# Patient Record
Sex: Female | Born: 1945 | Race: White | Hispanic: No | Marital: Married | Smoking: Former smoker
Health system: Southern US, Community
[De-identification: ages and names within clinical notes are randomized; demographics above are authoritative.]

## PROBLEM LIST (undated history)

## (undated) DIAGNOSIS — L409 Psoriasis, unspecified: Secondary | ICD-10-CM

## (undated) DIAGNOSIS — J018 Other acute sinusitis: Secondary | ICD-10-CM

## (undated) DIAGNOSIS — R0602 Shortness of breath: Secondary | ICD-10-CM

## (undated) DIAGNOSIS — M129 Arthropathy, unspecified: Secondary | ICD-10-CM

## (undated) DIAGNOSIS — K219 Gastro-esophageal reflux disease without esophagitis: Secondary | ICD-10-CM

## (undated) DIAGNOSIS — E669 Obesity, unspecified: Secondary | ICD-10-CM

## (undated) DIAGNOSIS — I872 Venous insufficiency (chronic) (peripheral): Secondary | ICD-10-CM

## (undated) DIAGNOSIS — G4733 Obstructive sleep apnea (adult) (pediatric): Secondary | ICD-10-CM

## (undated) DIAGNOSIS — I428 Other cardiomyopathies: Secondary | ICD-10-CM

## (undated) DIAGNOSIS — M1990 Unspecified osteoarthritis, unspecified site: Secondary | ICD-10-CM

## (undated) DIAGNOSIS — J449 Chronic obstructive pulmonary disease, unspecified: Secondary | ICD-10-CM

## (undated) DIAGNOSIS — I10 Essential (primary) hypertension: Secondary | ICD-10-CM

## (undated) HISTORY — DX: Obstructive sleep apnea (adult) (pediatric): G47.33

## (undated) HISTORY — PX: PELVIC FLOOR REPAIR: SHX2192

## (undated) HISTORY — DX: Other acute sinusitis: J01.80

## (undated) HISTORY — DX: Obesity, unspecified: E66.9

## (undated) HISTORY — DX: Gastro-esophageal reflux disease without esophagitis: K21.9

## (undated) HISTORY — DX: Venous insufficiency (chronic) (peripheral): I87.2

## (undated) HISTORY — PX: REPLACEMENT TOTAL KNEE: SUR1224

## (undated) HISTORY — DX: Unspecified osteoarthritis, unspecified site: M19.90

## (undated) HISTORY — DX: Other cardiomyopathies: I42.8

## (undated) HISTORY — DX: Arthropathy, unspecified: M12.9

---

## 1964-10-27 HISTORY — PX: SPLENECTOMY: SUR1306

## 1999-10-14 ENCOUNTER — Encounter: Payer: Self-pay | Admitting: *Deleted

## 1999-10-14 ENCOUNTER — Encounter: Admission: RE | Admit: 1999-10-14 | Discharge: 1999-10-14 | Payer: Self-pay | Admitting: *Deleted

## 1999-11-18 ENCOUNTER — Encounter: Payer: Self-pay | Admitting: Internal Medicine

## 1999-12-13 ENCOUNTER — Encounter: Payer: Self-pay | Admitting: Internal Medicine

## 2000-05-03 ENCOUNTER — Encounter: Payer: Self-pay | Admitting: Internal Medicine

## 2000-10-22 ENCOUNTER — Encounter: Payer: Self-pay | Admitting: *Deleted

## 2000-10-22 ENCOUNTER — Encounter: Admission: RE | Admit: 2000-10-22 | Discharge: 2000-10-22 | Payer: Self-pay | Admitting: *Deleted

## 2001-03-23 ENCOUNTER — Encounter: Payer: Self-pay | Admitting: Orthopedic Surgery

## 2001-03-29 ENCOUNTER — Inpatient Hospital Stay (HOSPITAL_COMMUNITY): Admission: AD | Admit: 2001-03-29 | Discharge: 2001-04-05 | Payer: Self-pay | Admitting: Orthopedic Surgery

## 2001-03-29 ENCOUNTER — Encounter: Payer: Self-pay | Admitting: Orthopedic Surgery

## 2001-08-04 ENCOUNTER — Other Ambulatory Visit: Admission: RE | Admit: 2001-08-04 | Discharge: 2001-08-04 | Payer: Self-pay | Admitting: Family Medicine

## 2001-10-25 ENCOUNTER — Encounter: Payer: Self-pay | Admitting: Family Medicine

## 2001-10-25 ENCOUNTER — Encounter: Admission: RE | Admit: 2001-10-25 | Discharge: 2001-10-25 | Payer: Self-pay | Admitting: Family Medicine

## 2002-10-26 ENCOUNTER — Encounter: Payer: Self-pay | Admitting: Family Medicine

## 2002-10-26 ENCOUNTER — Encounter: Admission: RE | Admit: 2002-10-26 | Discharge: 2002-10-26 | Payer: Self-pay | Admitting: Family Medicine

## 2003-08-08 ENCOUNTER — Other Ambulatory Visit: Admission: RE | Admit: 2003-08-08 | Discharge: 2003-08-08 | Payer: Self-pay | Admitting: Family Medicine

## 2003-09-06 ENCOUNTER — Encounter (INDEPENDENT_AMBULATORY_CARE_PROVIDER_SITE_OTHER): Payer: Self-pay | Admitting: Specialist

## 2003-09-06 ENCOUNTER — Ambulatory Visit (HOSPITAL_BASED_OUTPATIENT_CLINIC_OR_DEPARTMENT_OTHER): Admission: RE | Admit: 2003-09-06 | Discharge: 2003-09-06 | Payer: Self-pay | Admitting: Surgery

## 2003-09-06 ENCOUNTER — Ambulatory Visit (HOSPITAL_COMMUNITY): Admission: RE | Admit: 2003-09-06 | Discharge: 2003-09-06 | Payer: Self-pay | Admitting: Surgery

## 2003-10-10 ENCOUNTER — Ambulatory Visit (HOSPITAL_BASED_OUTPATIENT_CLINIC_OR_DEPARTMENT_OTHER): Admission: RE | Admit: 2003-10-10 | Discharge: 2003-10-10 | Payer: Self-pay | Admitting: Internal Medicine

## 2003-10-30 ENCOUNTER — Encounter: Admission: RE | Admit: 2003-10-30 | Discharge: 2003-10-30 | Payer: Self-pay | Admitting: Family Medicine

## 2004-06-27 ENCOUNTER — Encounter: Admission: RE | Admit: 2004-06-27 | Discharge: 2004-06-27 | Payer: Self-pay | Admitting: Orthopedic Surgery

## 2004-09-06 ENCOUNTER — Other Ambulatory Visit: Admission: RE | Admit: 2004-09-06 | Discharge: 2004-09-06 | Payer: Self-pay | Admitting: Obstetrics and Gynecology

## 2004-10-30 ENCOUNTER — Encounter: Admission: RE | Admit: 2004-10-30 | Discharge: 2004-10-30 | Payer: Self-pay | Admitting: Obstetrics and Gynecology

## 2005-05-22 ENCOUNTER — Ambulatory Visit: Payer: Self-pay | Admitting: Internal Medicine

## 2005-10-13 ENCOUNTER — Ambulatory Visit: Payer: Self-pay | Admitting: Internal Medicine

## 2005-10-15 ENCOUNTER — Ambulatory Visit: Payer: Self-pay | Admitting: Internal Medicine

## 2005-10-22 ENCOUNTER — Encounter: Payer: Self-pay | Admitting: Cardiology

## 2005-10-22 ENCOUNTER — Ambulatory Visit: Payer: Self-pay

## 2005-10-28 ENCOUNTER — Ambulatory Visit: Payer: Self-pay | Admitting: Internal Medicine

## 2005-10-31 ENCOUNTER — Encounter: Admission: RE | Admit: 2005-10-31 | Discharge: 2005-10-31 | Payer: Self-pay | Admitting: Family Medicine

## 2006-01-09 ENCOUNTER — Ambulatory Visit: Payer: Self-pay | Admitting: Internal Medicine

## 2006-02-02 ENCOUNTER — Ambulatory Visit: Payer: Self-pay | Admitting: Internal Medicine

## 2006-02-19 ENCOUNTER — Ambulatory Visit: Payer: Self-pay | Admitting: Internal Medicine

## 2006-02-27 ENCOUNTER — Inpatient Hospital Stay (HOSPITAL_BASED_OUTPATIENT_CLINIC_OR_DEPARTMENT_OTHER): Admission: RE | Admit: 2006-02-27 | Discharge: 2006-02-27 | Payer: Self-pay | Admitting: Internal Medicine

## 2006-02-27 ENCOUNTER — Ambulatory Visit: Payer: Self-pay | Admitting: Internal Medicine

## 2006-03-16 ENCOUNTER — Ambulatory Visit: Payer: Self-pay | Admitting: Internal Medicine

## 2006-03-19 ENCOUNTER — Encounter: Payer: Self-pay | Admitting: Cardiology

## 2006-03-19 ENCOUNTER — Ambulatory Visit: Payer: Self-pay

## 2006-03-30 ENCOUNTER — Ambulatory Visit: Payer: Self-pay | Admitting: Internal Medicine

## 2006-04-03 ENCOUNTER — Ambulatory Visit: Payer: Self-pay | Admitting: Internal Medicine

## 2006-04-23 ENCOUNTER — Ambulatory Visit: Payer: Self-pay | Admitting: Internal Medicine

## 2006-04-23 ENCOUNTER — Encounter (HOSPITAL_COMMUNITY): Admission: RE | Admit: 2006-04-23 | Discharge: 2006-07-07 | Payer: Self-pay | Admitting: Internal Medicine

## 2006-05-07 ENCOUNTER — Ambulatory Visit: Payer: Self-pay | Admitting: Internal Medicine

## 2006-05-07 ENCOUNTER — Ambulatory Visit: Payer: Self-pay | Admitting: Cardiology

## 2006-05-11 ENCOUNTER — Ambulatory Visit: Payer: Self-pay

## 2006-05-11 ENCOUNTER — Ambulatory Visit: Payer: Self-pay | Admitting: Internal Medicine

## 2006-05-21 ENCOUNTER — Ambulatory Visit: Payer: Self-pay | Admitting: Internal Medicine

## 2006-05-27 ENCOUNTER — Ambulatory Visit: Payer: Self-pay | Admitting: Internal Medicine

## 2006-07-27 ENCOUNTER — Ambulatory Visit: Payer: Self-pay | Admitting: Internal Medicine

## 2006-09-07 ENCOUNTER — Encounter: Payer: Self-pay | Admitting: Cardiology

## 2006-09-07 ENCOUNTER — Ambulatory Visit: Payer: Self-pay

## 2006-10-23 ENCOUNTER — Ambulatory Visit: Payer: Self-pay | Admitting: Internal Medicine

## 2006-10-23 LAB — CONVERTED CEMR LAB
Chol/HDL Ratio, serum: 4.7
HDL: 41.7 mg/dL (ref >39.0)
LDL Cholesterol: 139 mg/dL — ABNORMAL HIGH (ref 0–99)
Triglyceride fasting, serum: 75 mg/dL (ref 0–149)
VLDL: 15 mg/dL (ref 0–40)

## 2006-11-23 ENCOUNTER — Ambulatory Visit: Payer: Self-pay | Admitting: Internal Medicine

## 2007-02-11 ENCOUNTER — Ambulatory Visit: Payer: Self-pay | Admitting: Internal Medicine

## 2007-02-23 ENCOUNTER — Encounter: Admission: RE | Admit: 2007-02-23 | Discharge: 2007-02-23 | Payer: Self-pay | Admitting: Family Medicine

## 2007-03-19 ENCOUNTER — Ambulatory Visit (HOSPITAL_COMMUNITY): Admission: RE | Admit: 2007-03-19 | Discharge: 2007-03-19 | Payer: Self-pay | Admitting: Obstetrics and Gynecology

## 2007-03-19 ENCOUNTER — Encounter (INDEPENDENT_AMBULATORY_CARE_PROVIDER_SITE_OTHER): Payer: Self-pay | Admitting: Obstetrics and Gynecology

## 2007-04-01 ENCOUNTER — Ambulatory Visit: Payer: Self-pay | Admitting: Cardiology

## 2007-05-21 ENCOUNTER — Encounter: Payer: Self-pay | Admitting: Internal Medicine

## 2007-05-21 ENCOUNTER — Ambulatory Visit: Payer: Self-pay

## 2007-05-28 ENCOUNTER — Ambulatory Visit: Payer: Self-pay | Admitting: Internal Medicine

## 2007-08-26 ENCOUNTER — Ambulatory Visit: Payer: Self-pay | Admitting: Internal Medicine

## 2007-08-27 ENCOUNTER — Ambulatory Visit: Payer: Self-pay | Admitting: Internal Medicine

## 2007-12-02 ENCOUNTER — Encounter: Payer: Self-pay | Admitting: Internal Medicine

## 2007-12-02 DIAGNOSIS — E669 Obesity, unspecified: Secondary | ICD-10-CM | POA: Insufficient documentation

## 2007-12-02 DIAGNOSIS — J018 Other acute sinusitis: Secondary | ICD-10-CM

## 2007-12-02 DIAGNOSIS — I872 Venous insufficiency (chronic) (peripheral): Secondary | ICD-10-CM | POA: Insufficient documentation

## 2007-12-02 DIAGNOSIS — K219 Gastro-esophageal reflux disease without esophagitis: Secondary | ICD-10-CM | POA: Insufficient documentation

## 2007-12-02 DIAGNOSIS — G4733 Obstructive sleep apnea (adult) (pediatric): Secondary | ICD-10-CM | POA: Insufficient documentation

## 2007-12-02 DIAGNOSIS — I429 Cardiomyopathy, unspecified: Secondary | ICD-10-CM

## 2007-12-02 DIAGNOSIS — I428 Other cardiomyopathies: Secondary | ICD-10-CM | POA: Insufficient documentation

## 2007-12-02 DIAGNOSIS — M129 Arthropathy, unspecified: Secondary | ICD-10-CM | POA: Insufficient documentation

## 2007-12-30 ENCOUNTER — Ambulatory Visit: Payer: Self-pay | Admitting: Internal Medicine

## 2008-02-28 ENCOUNTER — Encounter: Admission: RE | Admit: 2008-02-28 | Discharge: 2008-02-28 | Payer: Self-pay | Admitting: Obstetrics and Gynecology

## 2008-02-29 ENCOUNTER — Ambulatory Visit (HOSPITAL_COMMUNITY): Admission: RE | Admit: 2008-02-29 | Discharge: 2008-02-29 | Payer: Self-pay | Admitting: Obstetrics and Gynecology

## 2008-03-14 ENCOUNTER — Telehealth (INDEPENDENT_AMBULATORY_CARE_PROVIDER_SITE_OTHER): Payer: Self-pay | Admitting: *Deleted

## 2008-03-28 ENCOUNTER — Ambulatory Visit: Payer: Self-pay | Admitting: Internal Medicine

## 2008-05-09 ENCOUNTER — Encounter: Payer: Self-pay | Admitting: Internal Medicine

## 2008-06-01 ENCOUNTER — Ambulatory Visit: Payer: Self-pay | Admitting: Internal Medicine

## 2008-06-01 LAB — CONVERTED CEMR LAB
BUN: 21 mg/dL (ref 6–23)
CO2: 32 meq/L (ref 19–32)
GFR calc Af Amer: 93 mL/min
GFR calc non Af Amer: 77 mL/min
Sodium: 143 meq/L (ref 135–145)

## 2008-06-21 ENCOUNTER — Encounter: Payer: Self-pay | Admitting: Internal Medicine

## 2008-06-21 ENCOUNTER — Ambulatory Visit: Payer: Self-pay

## 2008-07-07 ENCOUNTER — Encounter: Payer: Self-pay | Admitting: Internal Medicine

## 2008-07-26 ENCOUNTER — Encounter: Payer: Self-pay | Admitting: Internal Medicine

## 2008-08-21 ENCOUNTER — Ambulatory Visit: Payer: Self-pay | Admitting: Internal Medicine

## 2008-12-15 ENCOUNTER — Ambulatory Visit: Payer: Self-pay | Admitting: Internal Medicine

## 2009-01-02 ENCOUNTER — Encounter: Payer: Self-pay | Admitting: Internal Medicine

## 2009-01-31 ENCOUNTER — Inpatient Hospital Stay (HOSPITAL_COMMUNITY): Admission: RE | Admit: 2009-01-31 | Discharge: 2009-02-04 | Payer: Self-pay | Admitting: Orthopedic Surgery

## 2009-02-09 ENCOUNTER — Telehealth: Payer: Self-pay | Admitting: Internal Medicine

## 2009-04-10 ENCOUNTER — Encounter: Admission: RE | Admit: 2009-04-10 | Discharge: 2009-04-10 | Payer: Self-pay | Admitting: Family Medicine

## 2009-04-23 ENCOUNTER — Ambulatory Visit: Payer: Self-pay | Admitting: Internal Medicine

## 2009-05-22 ENCOUNTER — Telehealth: Payer: Self-pay | Admitting: Internal Medicine

## 2009-06-08 ENCOUNTER — Telehealth: Payer: Self-pay | Admitting: Internal Medicine

## 2009-06-13 ENCOUNTER — Encounter: Payer: Self-pay | Admitting: Internal Medicine

## 2009-07-18 ENCOUNTER — Telehealth: Payer: Self-pay | Admitting: Internal Medicine

## 2009-07-30 ENCOUNTER — Ambulatory Visit: Payer: Self-pay | Admitting: Internal Medicine

## 2009-07-30 DIAGNOSIS — E785 Hyperlipidemia, unspecified: Secondary | ICD-10-CM | POA: Insufficient documentation

## 2009-09-17 ENCOUNTER — Encounter: Admission: RE | Admit: 2009-09-17 | Discharge: 2009-09-17 | Payer: Self-pay | Admitting: Family Medicine

## 2009-10-22 ENCOUNTER — Ambulatory Visit: Payer: Self-pay | Admitting: Internal Medicine

## 2009-10-23 ENCOUNTER — Telehealth: Payer: Self-pay | Admitting: Internal Medicine

## 2009-10-27 HISTORY — PX: ABDOMINAL HYSTERECTOMY: SHX81

## 2009-10-27 HISTORY — PX: BLADDER SURGERY: SHX569

## 2009-11-19 ENCOUNTER — Telehealth (INDEPENDENT_AMBULATORY_CARE_PROVIDER_SITE_OTHER): Payer: Self-pay | Admitting: *Deleted

## 2010-02-01 ENCOUNTER — Telehealth (INDEPENDENT_AMBULATORY_CARE_PROVIDER_SITE_OTHER): Payer: Self-pay | Admitting: *Deleted

## 2010-02-21 ENCOUNTER — Ambulatory Visit: Payer: Self-pay | Admitting: Internal Medicine

## 2010-04-08 ENCOUNTER — Ambulatory Visit: Payer: Self-pay | Admitting: Cardiovascular Disease

## 2010-04-08 ENCOUNTER — Telehealth: Payer: Self-pay | Admitting: Internal Medicine

## 2010-04-08 ENCOUNTER — Ambulatory Visit (HOSPITAL_COMMUNITY): Admission: RE | Admit: 2010-04-08 | Discharge: 2010-04-08 | Payer: Self-pay | Admitting: Internal Medicine

## 2010-04-08 ENCOUNTER — Ambulatory Visit: Payer: Self-pay

## 2010-04-08 ENCOUNTER — Encounter: Payer: Self-pay | Admitting: Internal Medicine

## 2010-04-11 ENCOUNTER — Ambulatory Visit: Payer: Self-pay | Admitting: Internal Medicine

## 2010-04-11 DIAGNOSIS — J310 Chronic rhinitis: Secondary | ICD-10-CM | POA: Insufficient documentation

## 2010-04-12 ENCOUNTER — Encounter: Admission: RE | Admit: 2010-04-12 | Discharge: 2010-04-12 | Payer: Self-pay | Admitting: Obstetrics and Gynecology

## 2010-04-19 LAB — CONVERTED CEMR LAB: IgE (Immunoglobulin E), Serum: 80.9 intl units/mL (ref 0.0–180.0)

## 2010-05-13 ENCOUNTER — Ambulatory Visit: Payer: Self-pay | Admitting: Internal Medicine

## 2010-06-04 ENCOUNTER — Telehealth: Payer: Self-pay | Admitting: Internal Medicine

## 2010-06-05 ENCOUNTER — Encounter: Payer: Self-pay | Admitting: Internal Medicine

## 2010-06-06 ENCOUNTER — Telehealth: Payer: Self-pay | Admitting: Internal Medicine

## 2010-06-18 ENCOUNTER — Encounter: Payer: Self-pay | Admitting: Internal Medicine

## 2010-08-20 ENCOUNTER — Telehealth: Payer: Self-pay | Admitting: Internal Medicine

## 2010-08-20 ENCOUNTER — Ambulatory Visit: Payer: Self-pay | Admitting: Internal Medicine

## 2010-08-20 ENCOUNTER — Telehealth (INDEPENDENT_AMBULATORY_CARE_PROVIDER_SITE_OTHER): Payer: Self-pay | Admitting: *Deleted

## 2010-08-20 DIAGNOSIS — J42 Unspecified chronic bronchitis: Secondary | ICD-10-CM | POA: Insufficient documentation

## 2010-08-21 ENCOUNTER — Encounter: Payer: Self-pay | Admitting: Internal Medicine

## 2010-09-03 ENCOUNTER — Encounter (INDEPENDENT_AMBULATORY_CARE_PROVIDER_SITE_OTHER): Payer: Self-pay | Admitting: Obstetrics and Gynecology

## 2010-09-03 ENCOUNTER — Inpatient Hospital Stay (HOSPITAL_COMMUNITY)
Admission: RE | Admit: 2010-09-03 | Discharge: 2010-09-05 | Payer: Self-pay | Source: Home / Self Care | Admitting: Obstetrics and Gynecology

## 2010-10-07 ENCOUNTER — Ambulatory Visit: Payer: Self-pay | Admitting: Internal Medicine

## 2010-10-14 ENCOUNTER — Telehealth: Payer: Self-pay | Admitting: Internal Medicine

## 2010-10-31 ENCOUNTER — Ambulatory Visit: Admit: 2010-10-31 | Payer: Self-pay | Admitting: Internal Medicine

## 2010-11-28 NOTE — Assessment & Plan Note (Signed)
Summary: PER CHECK OUT/SF   Visit Type:  Follow-up Primary Crystal Yoder:  Crystal Yoder  CC:  ROV; No cardiac complaints.  History of Present Illness: Patient is a 65 year old with a history of idiopathic cardiomyopathy with normalization of LV function,  dyslipidemia, obesity, GERD, arthritis.  I last saw her in the spring.  Since seen she had an echo which showed the LVEF was 45 to 50%. She denies SOB.  No chest tightness.  NO edema. Underwent TAH with tacking of bladder in November.  Still recuperating.  Still has urinary incontinence.  Current Medications (verified): 1)  Bipap 17/8 2)  Carvedilol 3.125 Mg Tabs (Carvedilol) .Marland Kitchen.. 1 Tablet 2 Times Per Day 3)  Mucinex Dm 30-600 Mg Xr12h-Tab (Dextromethorphan-Guaifenesin) .... Take As Directed As Needed 4)  Claritin 10 Mg Tabs (Loratadine) .... Take 1 By Mouth Once Daily 5)  Fluticasone Propionate 50 Mcg/act Susp (Fluticasone Propionate) .Marland Kitchen.. 1-2 Sprays in Each Nostril Once Daily 6)  Aleve 220 Mg Tabs (Naproxen Sodium) .... Take As Directed As Needed 7)  Protonix 40 Mg Tbec (Pantoprazole Sodium) .Marland Kitchen.. 1 Tab Once Daily 8)  Losartan Potassium 50 Mg Tabs (Losartan Potassium) .Marland Kitchen.. 1 Tab Every Day 9)  Hydromet 5-1.5 Mg/26ml Syrp (Hydrocodone-Homatropine) .Marland Kitchen.. 1 Teaspoon Four Times A Day As Needed Cough  Allergies: 1)  ! Pcn 2)  ! Sulfa  Past History:  Family History: Last updated: Sep 18, 2008 Mother deceased at age 18 from failure to thrive Father deceased at age 30 from prostate cancer Son poor sleep hygiene, sleep apnea  Social History: Last updated: 03/28/2008 Patient states former smoker. She quit 28 yrs ago and she smoked for 10 yrs at 1/2 ppd.  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  Risk Factors: Smoking Status: quit (05/13/2010) Packs/Day: 0.5 (08/20/2010)  Past medical, surgical, family and social histories (including risk factors) reviewed, and no changes noted (except as noted  below).  Past Medical History: Reviewed history from 12/02/2007 and no changes required. CARDIOMYOPATHY (ICD-425.4) GASTROESOPHAGEAL REFLUX DISEASE (ICD-530.81) UNSPECIFIED VENOUS INSUFFICIENCY (ICD-459.81) EXOGENOUS OBESITY (ICD-278.00) ARTHRITIS (ICD-716.90) RHINOSINUSITIS, ACUTE (ICD-461.8) SLEEP APNEA, OBSTRUCTIVE (ICD-327.23)  Past Surgical History: Reviewed history from 04/23/2009 and no changes required. Splenectomy 1966  LeftKnee replacement 2001 Right Knee replacement- 2010  Family History: Reviewed history from Sep 18, 2008 and no changes required. Mother deceased at age 9 from failure to thrive Father deceased at age 15 from prostate cancer Son poor sleep hygiene, sleep apnea  Social History: Reviewed history from 03/28/2008 and no changes required. Patient states former smoker. She quit 28 yrs ago and she smoked for 10 yrs at 1/2 ppd.  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  Review of Systems       All systems reviewed.  Neg to the above problem.  Vital Signs:  Patient profile:   65 year old female Height:      59 inches Weight:      257 pounds BMI:     52.10 Pulse rate:   72 / minute BP sitting:   110 / 70  (left arm) Cuff size:   large  Vitals Entered By: Stanton Kidney, EMT-P (October 07, 2010 2:01 PM)  Physical Exam  Additional Exam:  Patient is in NAD. HEENT:  Normocephalic, atraumatic. EOMI, PERRLA.  Neck: JVP is normal. No thyromegaly. No bruits.  Lungs: clear to auscultation. No rales no wheezes.  Heart: Regular rate and rhythm. Normal  S1, S2. No S3.   No significant murmurs. PMI not displaced.  Abdomen:  Supple, nontender. Normal bowel sounds. No masses. No hepatomegaly.  Extremities:   Good distal pulses throughout. No lower extremity edema.  Musculoskeletal :moving all extremities.  Neuro:   alert and oriented x3.    Impression & Recommendations:  Problem # 1:  CARDIOMYOPATHY (ICD-425.4) Volume  status looks good.  I would keep on same regimen. Her updated medication list for this problem includes:    Carvedilol 3.125 Mg Tabs (Carvedilol) .Marland Kitchen... 1 tablet 2 times per day    Losartan Potassium 50 Mg Tabs (Losartan potassium) .Marland Kitchen... 1 tab every day  Problem # 2:  HYPERLIPIDEMIA-MIXED (ICD-272.4) Will review.  Problem # 3:  SLEEP APNEA, OBSTRUCTIVE (ICD-327.23) Encouraged her to try to use CPAP more.  Patient Instructions: 1)  Your physician recommends that you continue on your current medications as directed. Please refer to the Current Medication list given to you today.  Appended Document: PER CHECK OUT/SF Patient needs fasting lipid panel at her convenience.  It has been several years since last checked.  Appended Document: PER CHECK OUT/SF PT AWARE WILL HAVE LABS DONE AFTER FIRST OF THE YEAR./CY

## 2010-11-28 NOTE — Progress Notes (Signed)
Summary: rx  Phone Note Call from Patient Call back at Home Phone [phone] Call back at [phone] cell   Caller: Patient Call For: young Reason for Call: Refill Medication, Talk to Nurse Summary of Call: pt out of her cough medicine.  Would like a refill called in.  Still has very bad cough.  Hydromet syrup. [facility] - [phone] Initial call taken by: Eugene Gavia,  November 19, 2009 9:17 AM  Follow-up for Phone Call        Will forward to CY- last sent on 10/23/09 #279ml x0 - please advise if ok to send.  Thanks! Gweneth Dimitri RN  November 19, 2009 9:32 AM   Additional Follow-up for Phone Call Additional follow up Details #1::        Per CDY- ok to refill Hydromet syrup #323ml 1 tsp every 6 hours as needed cough. No refills.Reynaldo Minium CMA  November 19, 2009 11:53 AM     Additional Follow-up for Phone Call Additional follow up Details #2::    called, spoke with pt.  Pt informed hydromet has been called into [facility]  She verbalized understanding.  Follow-up by: Gweneth Dimitri RN,  November 19, 2009 12:00 PM  Prescriptions: HYDROMET 5-1.5 MG/5ML SYRP (HYDROCODONE-HOMATROPINE) 1 tsp every 6 hours as needed for cough  #300 ml x 0   Entered by:   Gweneth Dimitri RN   Authorized by:   Waymon Budge MD   Signed by:   Gweneth Dimitri RN on 11/19/2009   Method used:   Telephoned to ...       [facility]  [address] [phone]* (retail)       [address]       [place], [place]  [address]       Ph: [phone] or [phone]       Fax: [phone]   RxID:   [id number]

## 2010-11-28 NOTE — Progress Notes (Signed)
Summary: pt has questions  Phone Note Call from Patient   Caller: Patient [phone] Reason for Call: Talk to Nurse Summary of Call: pt has questions re last visit/medications Initial call taken by: Glynda Jaeger,  October 14, 2010 9:51 AM  Follow-up for Phone Call        spoke with pt  She needs to come in and have fasting lipid panel drawn and a follow up in 6 months She is aware and wants to ask Dr Tenny Craw a question.  Gave message to Dr Jasmine Pang, RN, BSN  October 14, 2010 10:05 AM     Appended Document: pt has questions Had questons about Ditropan and Losartan.  Should be ok to take together.

## 2010-11-28 NOTE — Assessment & Plan Note (Signed)
Summary: 6 months/apc   Primary Provider/Referring Provider:  Laurann Montana  CC:  6 month follow up visit-sleep; pt also c/o nasal congestion and productive cough-yellow..  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 Using Qvar only as needed .BiPAP being used some. Was bothered by condensation in hose. OK saline nasalgel before nasal pillows. TV til late, sleeps til 9AM. Joined [employer] and going to gym for water aerobics. Sees Dr. Lovina Reach card- for cardiomyopathy and bradycardia. Got Flu vax.   04/23/09- OSA, GERD, rhinosinusitis, cardiomyopathy Had second TKR in April without respiratory complications. She didn't use Bipap-"too complicated" but she is starting to use it again as she recovers.  Heart- Says heart is doing well and gave no problem with surgery. Last EF 55%. Denies chest pain. Dyspnea has much improved. Occasional mild morning cough with scant phlegm "nasal drainage".   October 22, 2009- OSA, GERD, rhinitis cardiomyopathy........................Marland Kitchenwith husband Cough [place] at beach. Dr there gave Z pak for sinusitis. Ears were stopped up. Then in [place], doctor gave Nasaccort AQ, naprosyn, Claritin, Mucinex DM. Some green from nose. Mild cough then. Returned home. Saw Dr Jearld Fenton who helped by cleaning ears. He gave more Nasacort, clindamycin. Felt better. Was also given samples of Xyzal.. Saw Dr Nelda Marseille at [facility]- got Levaquin, MucinexDM. He also gave Hydromet for increasing cough at night.  Still coughing. Had ducts checked- told black mold. Cough now productive green yellow in AM. Not using CPAP recently.  April 11, 2010- OSA, GERD, Rhinitis, cardiomyopathy Not using CPAP, blaming nasal stuffiness and cough. Using Neti pot twice daily. Taking an acid blocker intermittently. Taking Avelox after drainage of cyst on shoulder. Admits being very tired and says she wants to get back to her CPAP. Using flonase nasal spray.  Dropped off claritn.    Preventive Screening-Counseling & Management  Alcohol-Tobacco     Smoking Status: quit     Year Quit: 1980  Current Medications (verified): 1)  Bipap 17/8 2)  Carvedilol 3.125 Mg Tabs (Carvedilol) .Marland Kitchen.. 1 Tablet 2 Times Per Day 3)  Mucinex Dm 30-600 Mg Xr12h-Tab (Dextromethorphan-Guaifenesin) .... Take As Directed As Needed 4)  Claritin 10 Mg Tabs (Loratadine) .... Take 1 By Mouth Once Daily 5)  Fluticasone Propionate 50 Mcg/act Susp (Fluticasone Propionate) .Marland Kitchen.. 1-2 Sprays in Each Nostril Once Daily 6)  Aleve 220 Mg Tabs (Naproxen Sodium) .... Take As Directed As Needed 7)  Protonix 40 Mg Tbec (Pantoprazole Sodium) .Marland Kitchen.. 1 Tab Once Daily 8)  Losartan Potassium 50 Mg Tabs (Losartan Potassium) .Marland Kitchen.. 1 Tab Every Day  Allergies (verified): 1)  ! Pcn 2)  ! Sulfa  Past History:  Past Medical History: Last updated: 12/02/2007 CARDIOMYOPATHY (ICD-425.4) GASTROESOPHAGEAL REFLUX DISEASE (ICD-530.81) UNSPECIFIED VENOUS INSUFFICIENCY (ICD-459.81) EXOGENOUS OBESITY (ICD-278.00) ARTHRITIS (ICD-716.90) RHINOSINUSITIS, ACUTE (ICD-461.8) SLEEP APNEA, OBSTRUCTIVE (ICD-327.23)  Past Surgical History: Last updated: 04/23/2009 Splenectomy 1966  LeftKnee replacement 2001 Right Knee replacement- 2010  Family History: Last updated: 08-29-08 Mother deceased at age 34 from failure to thrive Father deceased at age 41 from prostate cancer Son poor sleep hygiene, sleep apnea  Social History: Last updated: 03/28/2008 Patient states former smoker. She quit 28 yrs ago and she smoked for 10 yrs at 1/2 ppd.  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  Risk Factors: Smoking Status: quit (04/11/2010)  Review of Systems      See HPI       The patient complains  of non-productive cough, nasal congestion/difficulty breathing through nose, and sneezing.  The patient denies shortness of breath with activity, shortness of breath at rest,  productive cough, coughing up blood, chest pain, irregular heartbeats, acid heartburn, indigestion, loss of appetite, weight change, abdominal pain, difficulty swallowing, sore throat, tooth/dental problems, and headaches.    Vital Signs:  Patient profile:   65 year old female Height:      69 inches Weight:      258 pounds BMI:     38.24 O2 Sat:      93 % on Room air Pulse rate:   86 / minute BP sitting:   116 / 74  (left arm) Cuff size:   large  Vitals Entered By: Reynaldo Minium CMA (April 11, 2010 11:11 AM)  O2 Flow:  Room air CC: 6 month follow up visit-sleep; pt also c/o nasal congestion and productive cough-yellow.   Physical Exam  Additional Exam:  General: A/Ox3; pleasant and cooperative, NAD, overweight, cheerful, yawning SKIN: no rash, lesions NODES: no lymphadenopathy HEENT: [place]/AT, EOM- WNL, Conjuctivae- clear, PERRLA, TM-WNL, Nose- mildly nasal, Throat- clear and wnl, Mallampati IV, no stridor NECK: Supple w/ fair ROM, JVD- none, normal carotid impulses w/o bruits Thyroid- CHEST: Clear to P&A, dry cough, no wheeze HEART: RRR, no m/g/r heard ABDOMEN: Soft and nl;  EAV:WUJW, nl pulses, trace  edema, heavy legs with prominent superficial varices at ankles, no cyanosis or clubbing  NEURO: Grossly intact to observation      Impression & Recommendations:  Problem # 1:  SLEEP APNEA, OBSTRUCTIVE (ICD-327.23)  Chronic poor compliance with CPAP. Weight loss also remains a goal. Compliance efforts continue.  Problem # 2:  ALLERGIC RHINITIS (ICD-477.9)  We will send Allergy profile and try sample Patanase with discussion. Refilled cough syrup Her updated medication list for this problem includes:    Claritin 10 Mg Tabs (Loratadine) .Marland Kitchen... Take 1 by mouth once daily    Fluticasone Propionate 50 Mcg/act Susp (Fluticasone propionate) .Marland Kitchen... 1-2 sprays in each nostril once daily  Medications Added to Medication List This Visit: 1)  Fluticasone Propionate 50 Mcg/act Susp  (Fluticasone propionate) .Marland Kitchen.. 1-2 sprays in each nostril once daily 2)  Hydrocodone-homatropine 5-1.5 Mg/47ml Syrp (Hydrocodone-homatropine) .Marland Kitchen.. 1 teaspoon four times a day as needed cough  Other Orders: Est. Patient Level III (11914) T-"RAST" (Allergy Full Profile) IGE ([address]-62130)  Patient Instructions: 1)  Please schedule a follow-up appointment in 1 month. 2)  lab 3)  Sample Patanase nasal spray: 1-2 puffs each nostril two times a day as needed  Prescriptions: HYDROCODONE-HOMATROPINE 5-1.5 MG/5ML SYRP (HYDROCODONE-HOMATROPINE) 1 teaspoon four times a day as needed cough  #200 ml x 1   Entered and Authorized by:   Waymon Budge MD   Signed by:   Waymon Budge MD on 04/11/2010   Method used:   Print then Give to Patient   RxID:   [id number]

## 2010-11-28 NOTE — Progress Notes (Signed)
Summary: surgical clearance  Phone Note From Other Clinic   Caller: NURSE AMANDA Summary of Call: Per Marchelle Folks Dr Edward Jolly ofc pt needs written clearance for surgery. ofc [id number] fax [phone]. Procedure 11/8 Initial call taken by: Edman Circle,  August 20, 2010 9:43 AM  Follow-up for Phone Call        pt was cleared for surg in Aug for hysterectomy, have left mess w/Amanda to call back and let us know if this is the same suregery or a new one Follow-up by: Meredith Staggers, RN,  August 20, 2010 9:57 AM  Additional Follow-up for Phone Call Additional follow up Details #1::        spoke w/Amanda it is for a hysterectomy it has not been done yet, it is sch for 11/8 and since clearance letter was from Aug they need a new one, will send to Dr Tenny Craw to address on [address]. Meredith Staggers, RN  August 20, 2010 10:02 AM     Additional Follow-up for Phone Call Additional follow up Details #2::    Letter dictated. Follow-up by: Sherrill Raring, MD, [facility],  August 21, 2010 5:21 AM   Appended Document: surgical clearance Letter faxed to [facility].

## 2010-11-28 NOTE — Assessment & Plan Note (Signed)
Summary: per dr silva/surgical clearance/mhh   Primary Crystal Yoder/Referring Crystal Yoder:  Crystal Yoder  CC:  Follow up , surgical clearance, scheduled for hysterectomy Nov.8th , Bi-pap averages 6 hrs per night , and not using now due to nasal congestion.  History of Present Illness: April 11, 2010- OSA, GERD, Rhinitis, cardiomyopathy Not using CPAP, blaming nasal stuffiness and cough. Using Neti pot twice daily. Taking an acid blocker intermittently. Taking Avelox after drainage of cyst on shoulder. Admits being very tired and says she wants to get back to her CPAP. Using flonase nasal spray. Dropped off claritn.  May 13, 2010- OSA, GERD, Rhinitis,  Hx cardiomyopathy Her cough is better, possibly because we are past the Spring pollen season.  IgE 86.9 with no specific elevation on allergy profile. She is trying to decide what time of year would be best if she has hysterectomy in hopes of avoiding infectins. With congestion better, she says she is "about ready to start" her BIPAP again, meaning she hasn't felt motivated to try it in a long time.  August 20, 2010-  OSA, GERD, Rhinitis,  Hx cardiomyopathy Nurse-CC: Follow up, surgical clearance, scheduled for hysterectomy Nov.8th , Bi-pap averages 6 hrs per night , not using now due to nasal congestion She is now facing hysterectomy and pelvic floor rebuild.  She still has BiPAP at 17/8. We discussed use at surgery.  Discussed postop respiratory management. Noting recent nasal congestion and is using Flonase now.  Chest feels ok. Had EKG today. Needs cough syrup. Dislikes benzonatate perles. Not wheezing   Preventive Screening-Counseling & Management  Alcohol-Tobacco     Packs/Day: 0.5     Year Started: 1968     Year Quit: 1980  Current Medications (verified): 1)  Bipap 17/8 2)  Carvedilol 3.125 Mg Tabs (Carvedilol) .Marland Kitchen.. 1 Tablet 2 Times Per Day 3)  Mucinex Dm 30-600 Mg Xr12h-Tab (Dextromethorphan-Guaifenesin) .... Take As Directed As  Needed 4)  Claritin 10 Mg Tabs (Loratadine) .... Take 1 By Mouth Once Daily 5)  Fluticasone Propionate 50 Mcg/act Susp (Fluticasone Propionate) .Marland Kitchen.. 1-2 Sprays in Each Nostril Once Daily 6)  Aleve 220 Mg Tabs (Naproxen Sodium) .... Take As Directed As Needed 7)  Protonix 40 Mg Tbec (Pantoprazole Sodium) .Marland Kitchen.. 1 Tab Once Daily 8)  Losartan Potassium 50 Mg Tabs (Losartan Potassium) .Marland Kitchen.. 1 Tab Every Day 9)  Calcium + D 250-125 Mg-Unit Tabs (Calcium-Vitamin D) .Marland Kitchen.. 1 Once Daily  Allergies (verified): 1)  ! Pcn 2)  ! Sulfa  Past History:  Past Medical History: Last updated: 12/02/2007 CARDIOMYOPATHY (ICD-425.4) GASTROESOPHAGEAL REFLUX DISEASE (ICD-530.81) UNSPECIFIED VENOUS INSUFFICIENCY (ICD-459.81) EXOGENOUS OBESITY (ICD-278.00) ARTHRITIS (ICD-716.90) RHINOSINUSITIS, ACUTE (ICD-461.8) SLEEP APNEA, OBSTRUCTIVE (ICD-327.23)  Past Surgical History: Last updated: 04/23/2009 Splenectomy 1966  LeftKnee replacement 2001 Right Knee replacement- 2010  Family History: Last updated: September 11, 2008 Mother deceased at age 21 from failure to thrive Father deceased at age 57 from prostate cancer Son poor sleep hygiene, sleep apnea  Social History: Last updated: 03/28/2008 Patient states former smoker. She quit 28 yrs ago and she smoked for 10 yrs at 1/2 ppd.  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  Risk Factors: Smoking Status: quit (05/13/2010) Packs/Day: 0.5 (08/20/2010)  Social History: Packs/Day:  0.5  Review of Systems      See HPI       The patient complains of nasal congestion/difficulty breathing through nose.  The patient denies shortness of breath with activity, shortness of breath at  rest, productive cough, non-productive cough, coughing up blood, chest pain, irregular heartbeats, acid heartburn, indigestion, loss of appetite, weight change, abdominal pain, difficulty swallowing, sore throat, tooth/dental problems, headaches, sneezing,  itching, rash, change in color of mucus, and fever.    Vital Signs:  Patient profile:   65 year old female Height:      68 inches Weight:      265 pounds BMI:     40.44 O2 Sat:      94 % on Room air Pulse rate:   82 / minute BP sitting:   104 / 64  (left arm) Cuff size:   large  Vitals Entered By: Renold Genta RCP, LPN (August 20, 2010 2:49 PM)  O2 Flow:  Room air CC: Follow up ,surgical clearance, scheduled for hysterectomy Nov.8th , Bi-pap averages 6 hrs per night , not using now due to nasal congestion Comments Medications reviewed with patient Renold Genta RCP, LPN  August 20, 2010 2:50 PM    Physical Exam  Additional Exam:  General: A/Ox3; pleasant and cooperative, NAD, overweight,  SKIN: no rash, lesions NODES: no lymphadenopathy HEENT: [place]/AT, EOM- WNL, Conjuctivae- clear, PERRLA, TM-WNL, Nose- mildly nasal, Throat- clear and wnl, Mallampati IV, no stridor NECK: Supple w/ fair ROM, JVD- none, normal carotid impulses w/o bruits Thyroid- CHEST: Clear to P&A, no cough, no wheeze HEART: RRR, no m/g/r heard ABDOMEN: Soft and nl;  ZHY:QMVH, nl pulses, trace  edema, heavy legs with prominent superficial varices at ankles, no cyanosis or clubbing  NEURO: Grossly intact to observation      Impression & Recommendations:  Problem # 1:  ALLERGIC RHINITIS (ICD-477.9)  Increasing rhinitis. Ok to use fluticasone now. We reviewed use of decongestants. Her updated medication list for this problem includes:    Claritin 10 Mg Tabs (Loratadine) .Marland Kitchen... Take 1 by mouth once daily    Fluticasone Propionate 50 Mcg/act Susp (Fluticasone propionate) .Marland Kitchen... 1-2 sprays in each nostril once daily  Problem # 2:  SLEEP APNEA, OBSTRUCTIVE (ICD-327.23)  Her compliance has never been great. I have tried to educate her for self protection during the postop analgesia stage.   Problem # 3:  BRONCHITIS (ICD-490) She will keep some cough syrup on hand. I do not think she needs antibioitics,  steroids or bronchodilator adjustment now.  The following medications were removed from the medication list:    Hydrocodone-homatropine 5-1.5 Mg/86ml Syrp (Hydrocodone-homatropine) .Marland Kitchen... 1 teaspoon four times a day as needed cough Her updated medication list for this problem includes:    Mucinex Dm 30-600 Mg Xr12h-tab (Dextromethorphan-guaifenesin) .Marland Kitchen... Take as directed as needed    Hydromet 5-1.5 Mg/38ml Syrp (Hydrocodone-homatropine) .Marland Kitchen... 1 teaspoon four times a day as needed cough  Medications Added to Medication List This Visit: 1)  Calcium + D 250-125 Mg-unit Tabs (Calcium-vitamin d) .Marland Kitchen.. 1 once daily 2)  Hydromet 5-1.5 Mg/54ml Syrp (Hydrocodone-homatropine) .Marland Kitchen.. 1 teaspoon four times a day as needed cough  Other Orders: Est. Patient Level IV (84696)  Patient Instructions: 1)  Please schedule a follow-up appointment in 6 months. 2)  Good luck with your surgery. 3)  Your BiPAP is currently set on 17/8...... FYI 4)  script for cough syrup 5)  script for flonase Prescriptions: HYDROMET 5-1.5 MG/5ML SYRP (HYDROCODONE-HOMATROPINE) 1 teaspoon four times a day as needed cough  #200 ml x 1   Entered and Authorized by:   Waymon Budge MD   Signed by:   Waymon Budge MD on 08/20/2010   Method used:  Print then Give to Patient   RxID:   [id number] FLUTICASONE PROPIONATE 50 MCG/ACT SUSP (FLUTICASONE PROPIONATE) 1-2 sprays in each nostril once daily  #1 x prn   Entered and Authorized by:   Waymon Budge MD   Signed by:   Waymon Budge MD on 08/20/2010   Method used:   Electronically to        [facility] [address] [phone]* (retail)       [address]       [place], [place]  [address]       Ph: [phone] or [phone]       Fax: [phone]   RxID:   [id number]

## 2010-11-28 NOTE — Letter (Signed)
  To Whom It May Concern,  Crystal Yoder. Rote  (DOB [birth year]) is a patient I follow in cardiology clinic.  She has mild LV dysfunction (LVEF 45 to 50%) but no significant CAD.  I saw her last in clinic earlier this summer.  From a cardiovascular standpoint she was doing well.  Overall I think she is a low risk for a major cardiovascular event and at low risk, OK to proceed with planned surgery.  If you have any questions please contact me at [phone].  Sincerely,    Pricilla Riffle, M.D., F.A.C.C.

## 2010-11-28 NOTE — Progress Notes (Signed)
Summary: refill  Phone Note Refill Request Call back at [phone] ext [phone] Message from:  Pharmacy on June 06, 2010 2:23 PM  Refills Requested: Medication #1:  LOSARTAN POTASSIUM 50 MG TABS 1 tab every day [facility]  Initial call taken by: Judie Grieve,  June 06, 2010 2:24 PM Caller: Patient Caller: [facility]  Summary of Call: Lorsartan 50mg     Prescriptions: LOSARTAN POTASSIUM 50 MG TABS (LOSARTAN POTASSIUM) 1 tab every day  #30 x 6   Entered by:   Burnett Kanaris, CNA   Authorized by:   Sherrill Raring, MD, [facility]   Signed by:   Burnett Kanaris, CNA on 06/07/2010   Method used:   Electronically to        [facility]* (retail)             ,          Ph: [phone]       Fax: [phone]   RxID:   [id number]

## 2010-11-28 NOTE — Assessment & Plan Note (Signed)
Summary: 1 MONTH/ MBW   Primary Provider/Referring Provider:  Laurann Montana  CC:  Follow up visit-sleep and allergies..  History of Present Illness: 04/23/09- OSA, GERD, rhinosinusitis, cardiomyopathy Had second TKR in April without respiratory complications. She didn't use Bipap-"too complicated" but she is starting to use it again as she recovers.  Heart- Says heart is doing well and gave no problem with surgery. Last EF 55%. Denies chest pain. Dyspnea has much improved. Occasional mild morning cough with scant phlegm "nasal drainage".   October 22, 2009- OSA, GERD, rhinitis cardiomyopathy........................Marland Kitchenwith husband [place]ough [place] beach. Dr there gave Z pak for sinusitis. Ears were stopped up. Then in [place], doctor gave Nasaccort AQ, naprosyn, Claritin, Mucinex DM. Some green from nose. Mild cough then. Returned home. Saw Dr Jearld Fenton who helped by cleaning ears. He gave more Nasacort, clindamycin. Felt better. Was also given samples of Xyzal.. Saw Dr Nelda Marseille at [facility]- got Levaquin, MucinexDM. He also gave Hydromet for increasing cough at night.  Still coughing. Had ducts checked- told black mold. Cough now productive green yellow in AM. Not using CPAP recently.  April 11, 2010- OSA, GERD, Rhinitis, cardiomyopathy Not using CPAP, blaming nasal stuffiness and cough. Using Neti pot twice daily. Taking an acid blocker intermittently. Taking Avelox after drainage of cyst on shoulder. Admits being very tired and says she wants to get back to her CPAP. Using flonase nasal spray. Dropped off claritn.  May 13, 2010- OSA, GERD, Rhinitis,  Hx cardiomyopathy Her cough is better, possibly because we are past the Spring pollen season.  IgE 86.9 with no specific elevation on allergy profile. She is trying to decide what time of year would be best if she has hysterectomy in hopes of avoiding infectins. With congestion better, she says she is "about ready to start" her BIPAP again,  meaning she hasn't felt motivated to try it in a long time.      Preventive Screening-Counseling & Management  Alcohol-Tobacco     Smoking Status: quit     Year Started: 1976     Year Quit: 1981  Current Medications (verified): 1)  Bipap 17/8 2)  Carvedilol 3.125 Mg Tabs (Carvedilol) .Marland Kitchen.. 1 Tablet 2 Times Per Day 3)  Mucinex Dm 30-600 Mg Xr12h-Tab (Dextromethorphan-Guaifenesin) .... Take As Directed As Needed 4)  Claritin 10 Mg Tabs (Loratadine) .... Take 1 By Mouth Once Daily 5)  Fluticasone Propionate 50 Mcg/act Susp (Fluticasone Propionate) .Marland Kitchen.. 1-2 Sprays in Each Nostril Once Daily 6)  Aleve 220 Mg Tabs (Naproxen Sodium) .... Take As Directed As Needed 7)  Protonix 40 Mg Tbec (Pantoprazole Sodium) .Marland Kitchen.. 1 Tab Once Daily 8)  Losartan Potassium 50 Mg Tabs (Losartan Potassium) .Marland Kitchen.. 1 Tab Every Day 9)  Hydrocodone-Homatropine 5-1.5 Mg/83ml Syrp (Hydrocodone-Homatropine) .Marland Kitchen.. 1 Teaspoon Four Times A Day As Needed Cough  Allergies (verified): 1)  ! Pcn 2)  ! Sulfa  Past History:  Past Medical History: Last updated: 12/02/2007 CARDIOMYOPATHY (ICD-425.4) GASTROESOPHAGEAL REFLUX DISEASE (ICD-530.81) UNSPECIFIED VENOUS INSUFFICIENCY (ICD-459.81) EXOGENOUS OBESITY (ICD-278.00) ARTHRITIS (ICD-716.90) RHINOSINUSITIS, ACUTE (ICD-461.8) SLEEP APNEA, OBSTRUCTIVE (ICD-327.23)  Past Surgical History: Last updated: 04/23/2009 Splenectomy 1966  LeftKnee replacement 2001 Right Knee replacement- 2010  Family History: Last updated: 08/29/08 Mother deceased at age 46 from failure to thrive Father deceased at age 18 from prostate cancer Son poor sleep hygiene, sleep apnea  Social History: Last updated: 03/28/2008 Patient states former smoker. She quit 28 yrs ago and she smoked for 10 yrs at 1/2 ppd.  She is  a Engineer, site. She drinks a glass of wine at bedtime most nights.  Risk Factors: Smoking Status: quit (05/13/2010)  Review of Systems      See HPI  The patient  denies shortness of breath with activity, shortness of breath at rest, productive cough, non-productive cough, coughing up blood, chest pain, irregular heartbeats, acid heartburn, indigestion, loss of appetite, weight change, abdominal pain, difficulty swallowing, sore throat, tooth/dental problems, headaches, nasal congestion/difficulty breathing through nose, and sneezing.    Vital Signs:  Patient profile:   65 year old female Height:      69 inches Weight:      260 pounds BMI:     38.53 O2 Sat:      94 % on Room air Pulse rate:   76 / minute BP sitting:   118 / 70  (left arm) Cuff size:   large  Vitals Entered By: Reynaldo Minium CMA (May 13, 2010 10:44 AM)  O2 Flow:  Room air CC: Follow up visit-sleep and allergies.   Physical Exam  Additional Exam:  General: A/Ox3; pleasant and cooperative, NAD, overweight, yawned once SKIN: no rash, lesions NODES: no lymphadenopathy HEENT: [place]/AT, EOM- WNL, Conjuctivae- clear, PERRLA, TM-WNL, Nose- mildly nasal, Throat- clear and wnl, Mallampati IV, no stridor NECK: Supple w/ fair ROM, JVD- none, normal carotid impulses w/o bruits Thyroid- CHEST: Clear to P&A, no cough, no wheeze HEART: RRR, no m/g/r heard ABDOMEN: Soft and nl;  GEX:BMWU, nl pulses, trace  edema, heavy legs with prominent superficial varices at ankles, no cyanosis or clubbing  NEURO: Grossly intact to observation      Impression & Recommendations:  Problem # 1:  SLEEP APNEA, OBSTRUCTIVE (ICD-327.23)  I have encouraged weight loss and resumption of her BIPAP. Hopefully she will decide to make the effort. Her husband has been supportive.   Problem # 2:  ALLERGIC RHINITIS (ICD-477.9)  Not significant now. We will reassess as needed as the season changes.she has flonase and claritin. Her updated medication list for this problem includes:    Claritin 10 Mg Tabs (Loratadine) .Marland Kitchen... Take 1 by mouth once daily    Fluticasone Propionate 50 Mcg/act Susp (Fluticasone  propionate) .Marland Kitchen... 1-2 sprays in each nostril once daily  Other Orders: Est. Patient Level IV (13244)  Patient Instructions: 1)  Please schedule a follow-up appointment in 1 year. 2)  Please try to get started using the BiPAP again. If there is a specific discomfort, ask the homecare company for advice about it.

## 2010-11-28 NOTE — Progress Notes (Signed)
Summary: Faxed EKG to [name] at [facility].  Faxed EKG to [name] at [facility]. F: [phone] [phone] Crystal Yoder  August 20, 2010 11:26 AM

## 2010-11-28 NOTE — Progress Notes (Signed)
Summary: cyst on back  Phone Note Call from Patient Call back at Work Phone [phone] Call back at [phone]   Caller: Patient Reason for Call: Talk to Nurse Summary of Call: cyst on the left hand side of her back and wants to know if someone can take a look at it when shes here at 10:30 for her echo. she has an appt with a dermatologist at the end of the month and wants to know if her cardiologist thinks she needs to be seen sooner. I told her that she made need to talk to her primary care about this and pt said that she didnt need an ofc visit just someone to take a quick look at it.  Initial call taken by: Edman Circle,  April 08, 2010 10:16 AM  Follow-up for Phone Call        We do not deal with skin issuses in Cardiology.  She will need to keep her apt with dermatology next week. Dennis Bast, RN, BSN  April 08, 2010 10:37 AM

## 2010-11-28 NOTE — Letter (Signed)
Summary: Generic Letter  Architectural technologist, Main Office  [address]   [place], [place] [address]   Phone: [phone]  Fax: [phone]    08/21/2010      To Whom It May Concern:  Crystal Yoder ([birth year]) is a patient I follow in cardiology clinic.  I saw her last in April.  She has a history of nonischemic cardiomyopathy.  Echocardiogram this summer showed only mild decrease in LV systolic function (LVEF 45 to 46%).  When I saw her in April she was doing well from a cardiovascular standpoint.  Overall, I think she is a low risk for any major cardiac  complications and is OK to proceed without further testing.   Sincerely,   Dietrich Pates, MD, [facility]

## 2010-11-28 NOTE — Assessment & Plan Note (Signed)
Summary: 6 month rov/sl   Visit Type:  Follow-up Primary Provider:  Laurann Montana   History of Present Illness: Patient is a 65 year old with a history of idiopathic cardiomyopathy with normalization of LV function,  dyslipidemia, obesity, GERD, arthritis.  I last saw her in the fall. Since seen she has done well from a cardiac standpt.  She has had a problem with a cough that began in the fall.  It is felt to be due to allergies.  She is doing nasal lavages with some help. She denies significant shortness of breath, no chest pain. no edema.  Current Medications (verified): 1)  Bipap 17/8 2)  Carvedilol 3.125 Mg Tabs (Carvedilol) .Marland Kitchen.. 1 Tablet 2 Times Per Day 3)  Atacand 4 Mg Tabs (Candesartan Cilexetil) .Marland Kitchen.. 1 Tab Qd 4)  Mucinex Dm 30-600 Mg Xr12h-Tab (Dextromethorphan-Guaifenesin) .... Take As Directed As Needed 5)  Claritin 10 Mg Tabs (Loratadine) .... Take 1 By Mouth Once Daily 6)  Nasacort Aq 55 Mcg/act Aers (Triamcinolone Acetonide(Nasal)) .... 2 Sprays in Each Nostril Once Daily 7)  Aleve 220 Mg Tabs (Naproxen Sodium) .... Take As Directed As Needed 8)  Hydromet 5-1.5 Mg/43ml Syrp (Hydrocodone-Homatropine) .Marland Kitchen.. 1 Tsp Every 6 Hours As Needed For Cough 9)  Protonix 40 Mg Tbec (Pantoprazole Sodium) .Marland Kitchen.. 1 Tab Once Daily  Allergies (verified): 1)  ! Pcn 2)  ! Sulfa  Past History:  Past medical, surgical, family and social histories (including risk factors) reviewed, and no changes noted (except as noted below).  Past Medical History: Reviewed history from 12/02/2007 and no changes required. CARDIOMYOPATHY (ICD-425.4) GASTROESOPHAGEAL REFLUX DISEASE (ICD-530.81) UNSPECIFIED VENOUS INSUFFICIENCY (ICD-459.81) EXOGENOUS OBESITY (ICD-278.00) ARTHRITIS (ICD-716.90) RHINOSINUSITIS, ACUTE (ICD-461.8) SLEEP APNEA, OBSTRUCTIVE (ICD-327.23)  Past Surgical History: Reviewed history from 04/23/2009 and no changes required. Splenectomy 1966  LeftKnee replacement 2001 Right Knee  replacement- 2010  Family History: Reviewed history from 08/21/2008 and no changes required. Mother deceased at age 74 from failure to thrive Father deceased at age 65 from prostate cancer Son poor sleep hygiene, sleep apnea  Social History: Reviewed history from 03/28/2008 and no changes required. Patient states former smoker. She quit 28 yrs ago and she smoked for 10 yrs at 1/2 ppd.  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  Review of Systems       All systems reviewed.  Negative to above problem.  Vital Signs:  Patient profile:   65 year old female Height:      69 inches Weight:      262 pounds Pulse rate:   76 / minute BP sitting:   100 / 78  (left arm) Cuff size:   large  Vitals Entered By: Burnett Kanaris, CNA (February 21, 2010 3:43 PM)  Physical Exam  Additional Exam:  patient is in NAD HEENT:  Normocephalic, atraumatic. EOMI, PERRLA.  Neck: JVP is normal. No thyromegaly. No bruits.  Lungs: clear to auscultation. No rales no wheezes.  Heart: Regular rate and rhythm. Normal S1, S2. No S3.   No significant murmurs. PMI not displaced.  Abdomen:  Supple, nontender. Normal bowel sounds. No masses. No hepatomegaly.  Extremities:   Good distal pulses throughout. No lower extremity edema.  Musculoskeletal :moving all extremities.  Neuro:   alert and oriented x3.    EKG  Procedure date:  02/21/2010  Findings:      NSR>  LAFB.  LVH.  76 bpm.  Impression & Recommendations:  Problem # 1:  CARDIOMYOPATHY (ICD-425.4) Volume status looks good.  Last echo in 2009 showed normalization of LV function.  I would repeat. I will give her an Rx for Cozaar for a lower copay.  COntinue other meds.  Problem # 2:  ARTHRITIS (ICD-716.90) She wants to try Aleve to see if it helps with her feet.  I think a conserviative trial of 200 two times a day.  F?U with BMET, BNP  Problem # 3:  HYPERLIPIDEMIA-MIXED (ICD-272.4) Will set up for fasting lipids.   Patient has tried to change diet.  Reluctatnt to start statin.  I discussed importance of avoiding CAD with hx of CM.  will arrange to check in near future.  Other Orders: EKG w/ Interpretation (93000) Echocardiogram (Echo)  Patient Instructions: 1)  Your physician has requested that you have an echocardiogram.  Echocardiography is a painless test that uses sound waves to create images of your heart. It provides your doctor with information about the size and shape of your heart and how well your heart's chambers and valves are working.  This procedure takes approximately one hour. There are no restrictions for this procedure. 2)  Your physician recommends that you return for a FASTING lipid profile: lipid and bmet at [facility] office 3)  Your physician wants you to follow-up in: DEC 2011  You will receive a reminder letter in the mail two months in advance. If you don't receive a letter, please call our office to schedule the follow-up appointment. Prescriptions: LOSARTAN POTASSIUM 50 MG TABS (LOSARTAN POTASSIUM) 1 tab every day  #30 x 6   Entered by:   Layne Benton, RN, BSN   Authorized by:   Sherrill Raring, MD, [facility]   Signed by:   Layne Benton, RN, BSN on 02/21/2010   Method used:   Electronically to        [facility] [address] [phone]* (retail)       [address]       [place], [place]  [address]       Ph: [phone] or [phone]       Fax: [phone]   RxID:   [id number]

## 2010-11-28 NOTE — Progress Notes (Signed)
Summary: Refill  Phone Note Refill Request Message from:  Patient on February 01, 2010 12:00 PM  Refills Requested: Medication #1:  CARVEDILOL 3.125 MG TABS 1 tablet 2 times per day   Supply Requested: 1 year  Medication #2:  ATACAND 4 MG TABS 1 tab qd   Supply Requested: 1 year [facility] on [address]   Method Requested: Telephone to Pharmacy Initial call taken by: Migdalia Dk,  February 01, 2010 12:00 PM  Follow-up for Phone Call        Rx faxed to pharmacy Follow-up by: Oswald Hillock,  February 01, 2010 2:10 PM    Prescriptions: ATACAND 4 MG TABS (CANDESARTAN CILEXETIL) 1 tab qd  #30 x 6   Entered by:   Oswald Hillock   Authorized by:   Sherrill Raring, MD, [facility]   Signed by:   Oswald Hillock on 02/01/2010   Method used:   Faxed to ...       [facility]  [address] [phone]* (retail)       [address]       [place], [place]  [address]       Ph: [phone] or [phone]       Fax: [phone]   RxID:   [id number] CARVEDILOL 3.125 MG TABS (CARVEDILOL) 1 tablet 2 times per day  #60 x 6   Entered by:   Oswald Hillock   Authorized by:   Sherrill Raring, MD, [facility]   Signed by:   Oswald Hillock on 02/01/2010   Method used:   Faxed to ...       [facility]  [address] [phone]* (retail)       [address]       [place], [place]  [address]       Ph: [phone] or [phone]       Fax: [phone]   RxID:   [id number]

## 2010-11-28 NOTE — Progress Notes (Signed)
Summary: pt needs letter of clearence  Phone Note Call from Patient Call back at Home Phone [phone]   Caller: Patient Reason for Call: Talk to Nurse, Talk to Doctor Summary of Call: pt needs clearence sent to Dr. Conley Simmonds office# [phone] with [facility]. pt is to have a hysterectomy. Dose she need to be seen before she can get that letter. Initial call taken by: Omer Jack,  June 04, 2010 3:44 PM  Follow-up for Phone Call        spoke with pt, she is aware dr Tenny Craw will be back in the office friday this week. will foward for dr Tenny Craw review Deliah Goody, RN  June 04, 2010 3:51 PM   Additional Follow-up for Phone Call Additional follow up Details #1::        no. See letter of clearance.  Low risk OK to proceed. Additional Follow-up by: Sherrill Raring, MD, [facility],  June 05, 2010 8:40 AM     Appended Document: pt needs letter of clearence Patient aware of above and letter faxed to [facility].

## 2010-12-02 ENCOUNTER — Telehealth (INDEPENDENT_AMBULATORY_CARE_PROVIDER_SITE_OTHER): Payer: Self-pay | Admitting: *Deleted

## 2010-12-10 ENCOUNTER — Ambulatory Visit (INDEPENDENT_AMBULATORY_CARE_PROVIDER_SITE_OTHER): Payer: MEDICARE | Admitting: Internal Medicine

## 2010-12-10 ENCOUNTER — Encounter: Payer: Self-pay | Admitting: Internal Medicine

## 2010-12-10 DIAGNOSIS — J018 Other acute sinusitis: Secondary | ICD-10-CM

## 2010-12-10 DIAGNOSIS — J40 Bronchitis, not specified as acute or chronic: Secondary | ICD-10-CM

## 2010-12-12 NOTE — Progress Notes (Signed)
Summary: Cold and cough  Phone Note Call from Patient Call back at Home Phone [phone]   Caller: Patient Call For: young Summary of Call: Patient has cold and cough.  Wants prescription for hydromet syrup.  Pharmacy is [facility] on [address], [phone]. Initial call taken by: Leonette Monarch,  December 02, 2010 9:28 AM  Follow-up for Phone Call        called and spoke with pt.  pt recently had hysterectomy, bladder tact and "anterior posterior" surgery done Sep 03, 2010 and states she currently has a cough which is "aggrevating the healing process from her surgery".  Pt requesting rx for hydromet.  Symptoms started a few days ago.  C/o coughing up yellow sputum, scratchy throat, head congestion, headache.  Denies fever or facial pressure.  Using Claritin, Flonase, netipot and Mucinex and Simply saline with no relief of symptoms.   allergies: PCN and Sulfa Arman Filter LPN  December 02, 2010 10:20 AM   Additional Follow-up for Phone Call Additional follow up Details #1::        Per CDY-okay to give Hydromet Cough syrup #22ml 1 teaspoon qid as needed no refills and offer Zpak #1 take as directed no refills.Reynaldo Minium CMA  December 02, 2010 11:48 AM     Additional Follow-up for Phone Call Additional follow up Details #2::    Rxs were called to pharm.  Spoke with pt and advised that this was done.  Follow-up by: Vernie Murders,  December 02, 2010 12:16 PM  New/Updated Medications: HYDROMET 5-1.5 MG/5ML SYRP (HYDROCODONE-HOMATROPINE) 1 tsp four times a day as needed ZITHROMAX Z-PAK 250 MG TABS (AZITHROMYCIN) take as directed Prescriptions: ZITHROMAX Z-PAK 250 MG TABS (AZITHROMYCIN) take as directed  #1 x 0   Entered by:   Vernie Murders   Authorized by:   Waymon Budge MD   Signed by:   Vernie Murders on 12/02/2010   Method used:   Telephoned to ...       [facility]  [address] [phone]* (retail)       [address]       [place], [place]   [address]       Ph: [phone] or [phone]       Fax: [phone]   RxID:   [id number] HYDROMET 5-1.5 MG/5ML SYRP (HYDROCODONE-HOMATROPINE) 1 tsp four times a day as needed  #200 ml x 0   Entered by:   Vernie Murders   Authorized by:   Waymon Budge MD   Signed by:   Vernie Murders on 12/02/2010   Method used:   Telephoned to ...       [facility]  [address] [phone]* (retail)       [address]       [place], [place]  [address]       Ph: [phone] or [phone]       Fax: [phone]   RxID:   [id number]

## 2010-12-18 NOTE — Assessment & Plan Note (Signed)
Summary: per pt call/cough/mhh   Primary Provider/Referring Provider:  Laurann Montana  CC:  Pt c/o productive cough with yellow phlegm x 2 weeks. Marland Kitchen  History of Present Illness: May 13, 2010- OSA, GERD, Rhinitis,  Hx cardiomyopathy Her cough is better, possibly because we are past the Spring pollen season.  IgE 86.9 with no specific elevation on allergy profile. She is trying to decide what time of year would be best if she has hysterectomy in hopes of avoiding infections. With congestion better, she says she is "about ready to start" her BIPAP again, meaning she hasn't felt motivated to try it in a long time.  August 20, 2010-  OSA, GERD, Rhinitis,  Hx cardiomyopathy Nurse-CC: Follow up, surgical clearance, scheduled for hysterectomy Nov.8th , Bi-pap averages 6 hrs per night , not using now due to nasal congestion She is now facing hysterectomy and pelvic floor rebuild.  She still has BiPAP at 17/8. We discussed use at surgery.  Discussed postop respiratory management. Noting recent nasal congestion and is using Flonase now.  Chest feels ok. Had EKG today. Needs cough syrup. Dislikes benzonatate perles. Not wheezing   December 10, 2010-  OSA, GERD, Rhinitis,  Hx cardiomyopathy, hx splenectomy....husband here Nurse-CC: Pt c/o productive cough with yellow phlegm x 2 weeks.  Coughing hard from a cold has undone the pelvic floor repair she just had in November. Getting over the cold, but cough lingers. Notes a little wheeze, reflux not bad. Just finshed Z pak.  Hashad Pneumovax x 2. OSA- not using BiPAP reguarly.    Preventive Screening-Counseling & Management  Alcohol-Tobacco     Smoking Status: quit     Packs/Day: 0.5     Year Started: 1968     Year Quit: 1980  Current Medications (verified): 1)  Bipap 17/8 2)  Carvedilol 3.125 Mg Tabs (Carvedilol) .Marland Kitchen.. 1 Tablet 2 Times Per Day 3)  Mucinex Dm 30-600 Mg Xr12h-Tab (Dextromethorphan-Guaifenesin) .... Take As Directed As Needed 4)   Claritin 10 Mg Tabs (Loratadine) .... Take 1 By Mouth Once Daily 5)  Fluticasone Propionate 50 Mcg/act Susp (Fluticasone Propionate) .Marland Kitchen.. 1-2 Sprays in Each Nostril Once Daily 6)  Protonix 40 Mg Tbec (Pantoprazole Sodium) .Marland Kitchen.. 1 Tab Once Daily 7)  Losartan Potassium 50 Mg Tabs (Losartan Potassium) .Marland Kitchen.. 1 Tab Every Day 8)  Hydromet 5-1.5 Mg/83ml Syrp (Hydrocodone-Homatropine) .Marland Kitchen.. 1 Tsp Four Times A Day As Needed  Allergies (verified): 1)  ! Pcn 2)  ! Sulfa  Past History:  Past Medical History: Last updated: 12/02/2007 CARDIOMYOPATHY (ICD-425.4) GASTROESOPHAGEAL REFLUX DISEASE (ICD-530.81) UNSPECIFIED VENOUS INSUFFICIENCY (ICD-459.81) EXOGENOUS OBESITY (ICD-278.00) ARTHRITIS (ICD-716.90) RHINOSINUSITIS, ACUTE (ICD-461.8) SLEEP APNEA, OBSTRUCTIVE (ICD-327.23)  Family History: Last updated: Sep 06, 2008 Mother deceased at age 42 from failure to thrive Father deceased at age 28 from prostate cancer Son poor sleep hygiene, sleep apnea  Social History: Last updated: 03/28/2008 Patient states former smoker. She quit 28 yrs ago and she smoked for 10 yrs at 1/2 ppd.  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  Risk Factors: Smoking Status: quit (12/10/2010) Packs/Day: 0.5 (12/10/2010)  Past Surgical History: Splenectomy 1966  LeftKnee replacement 2001 Right Knee replacement- 2010 Bladder surgery-08-2010 Pelvic repair 2011- TAH, A-P repair, Bladder tack  Review of Systems      See HPI       The patient complains of hoarseness and prolonged cough.  The patient denies anorexia, fever, weight loss, weight gain, vision loss, decreased hearing, chest pain,  syncope, dyspnea on exertion, peripheral edema, headaches, hemoptysis, severe indigestion/heartburn, unusual weight change, abnormal bleeding, enlarged lymph nodes, and angioedema.    Vital Signs:  Patient profile:   65 year old female Height:      69 inches Weight:      267.25 pounds O2 Sat:       93 % on Room air Pulse rate:   75 / minute BP sitting:   122 / 72  (right arm) Cuff size:   large  Vitals Entered By: Carron Curie CMA (December 10, 2010 4:28 PM)  O2 Flow:  Room air CC: Pt c/o productive cough with yellow phlegm x 2 weeks.  Comments Medications reviewed with patient Carron Curie CMA  December 10, 2010 4:29 PM Daytime phone number verified with patient.    Physical Exam  Additional Exam:  General: A/Ox3; pleasant and cooperative, NAD, overweight,  SKIN: no rash, lesions NODES: no lymphadenopathy HEENT: Evans/AT, EOM- WNL, Conjuctivae- clear, PERRLA, TM-WNL, Nose- mildly nasal, sniiffing , Throat- clear and wnl, Mallampati IV, no stridor NECK: Supple w/ fair ROM, JVD- none, normal carotid impulses w/o bruits Thyroid- CHEST: Clear to P&A, dry cough, trace wheeze HEART: RRR, no m/g/r heard ABDOMEN: Soft and nl;  QIO:NGEX, nl pulses, trace  edema, heavy legs with prominent superficial varices at ankles, no cyanosis or clubbing  NEURO: Grossly intact to observation      Impression & Recommendations:  Problem # 1:  BRONCHITIS (ICD-490) Post viral bronchitis with enough residual yellow to justify trial of antibiotic.Will give neb, depo and doxy, with cough syrup The following medications were removed from the medication list:    Hydromet 5-1.5 Mg/77ml Syrp (Hydrocodone-homatropine) .Marland Kitchen... 1 teaspoon four times a day as needed cough    Zithromax Z-pak 250 Mg Tabs (Azithromycin) .Marland Kitchen... Take as directed Her updated medication list for this problem includes:    Mucinex Dm 30-600 Mg Xr12h-tab (Dextromethorphan-guaifenesin) .Marland Kitchen... Take as directed as needed    Hydromet 5-1.5 Mg/75ml Syrp (Hydrocodone-homatropine) .Marland Kitchen... 1 tsp four times a day as needed    Doxycycline Hyclate 100 Mg Caps (Doxycycline hyclate) .Marland Kitchen... 2 today then one daily    Promethazine-codeine 6.25-10 Mg/22ml Syrp (Promethazine-codeine) .Marland Kitchen... 1 teaspoon four times a day as needed cough  Medications  Added to Medication List This Visit: 1)  Doxycycline Hyclate 100 Mg Caps (Doxycycline hyclate) .... 2 today then one daily 2)  Promethazine-codeine 6.25-10 Mg/15ml Syrp (Promethazine-codeine) .Marland Kitchen.. 1 teaspoon four times a day as needed cough  Other Orders: Est. Patient Level III (52841)  Patient Instructions: 1)  Please schedule a follow-up appointment as needed. 2)  Neb a 3)  depo 80 4)  script for doxycycline 5)  script for cough syrup Prescriptions: PROMETHAZINE-CODEINE 6.25-10 MG/5ML SYRP (PROMETHAZINE-CODEINE) 1 teaspoon four times a day as needed cough  #200 ml x 0   Entered and Authorized by:   Waymon Budge MD   Signed by:   Waymon Budge MD on 12/10/2010   Method used:   Print then Give to Patient   RxID:   [id number] DOXYCYCLINE HYCLATE 100 MG CAPS (DOXYCYCLINE HYCLATE) 2 today then one daily  #8 x 0   Entered and Authorized by:   Waymon Budge MD   Signed by:   Waymon Budge MD on 12/10/2010   Method used:   Print then Give to Patient   RxID:   [id number]    Immunization History:  Influenza Immunization History:    Influenza:  historical (09/02/2010)

## 2011-01-07 LAB — BASIC METABOLIC PANEL
CO2: 31 mEq/L (ref 19–32)
Calcium: 8.4 mg/dL (ref 8.4–10.5)
Calcium: 8.5 mg/dL (ref 8.4–10.5)
Chloride: 110 mEq/L (ref 96–112)
GFR calc non Af Amer: >60 mL/min (ref >60)
Glucose, Bld: 85 mg/dL (ref 70–99)
Potassium: 4.6 mEq/L (ref 3.5–5.1)
Sodium: 143 mEq/L (ref 135–145)

## 2011-01-07 LAB — CBC
Hemoglobin: 11.9 g/dL — ABNORMAL LOW (ref 12.0–15.0)
MCHC: 32.7 g/dL (ref 30.0–36.0)
Platelets: 146 10*3/uL — ABNORMAL LOW (ref 150–400)
RDW: 14.2 % (ref 11.5–15.5)

## 2011-01-07 LAB — TYPE AND SCREEN: Antibody Screen: NEGATIVE

## 2011-01-08 LAB — URINALYSIS, ROUTINE W REFLEX MICROSCOPIC
Bilirubin Urine: NEGATIVE
Glucose, UA: NEGATIVE mg/dL
Hgb urine dipstick: NEGATIVE
Protein, ur: NEGATIVE mg/dL
Specific Gravity, Urine: 1.02 (ref 1.005–1.030)
pH: 6.5 (ref 5.0–8.0)

## 2011-01-08 LAB — COMPREHENSIVE METABOLIC PANEL
ALT: 15 U/L (ref 0–35)
AST: 18 U/L (ref 0–37)
Calcium: 9.4 mg/dL (ref 8.4–10.5)
GFR calc non Af Amer: >60 mL/min (ref >60)
Sodium: 140 mEq/L (ref 135–145)
Total Bilirubin: 0.5 mg/dL (ref 0.3–1.2)
Total Protein: 7.4 g/dL (ref 6.0–8.3)

## 2011-01-08 LAB — CBC
MCHC: 32.9 g/dL (ref 30.0–36.0)
MCV: 94.6 fL (ref 78.0–100.0)
Platelets: 212 10*3/uL (ref 150–400)

## 2011-01-08 LAB — APTT: aPTT: 26 seconds (ref 24–37)

## 2011-01-08 LAB — PROTIME-INR: INR: 0.94 (ref 0.00–1.49)

## 2011-02-05 LAB — BASIC METABOLIC PANEL
BUN: 10 mg/dL (ref 6–23)
BUN: 7 mg/dL (ref 6–23)
BUN: 9 mg/dL (ref 6–23)
BUN: 9 mg/dL (ref 6–23)
CO2: 30 mEq/L (ref 19–32)
CO2: 30 mEq/L (ref 19–32)
CO2: 30 mEq/L (ref 19–32)
Calcium: 8.1 mg/dL — ABNORMAL LOW (ref 8.4–10.5)
Calcium: 8.4 mg/dL (ref 8.4–10.5)
Calcium: 8.5 mg/dL (ref 8.4–10.5)
Chloride: 104 mEq/L (ref 96–112)
Chloride: 104 mEq/L (ref 96–112)
Creatinine, Ser: 0.52 mg/dL (ref 0.4–1.2)
Creatinine, Ser: 0.68 mg/dL (ref 0.4–1.2)
GFR calc Af Amer: >60 mL/min (ref >60)
GFR calc non Af Amer: >60 mL/min (ref >60)
GFR calc non Af Amer: >60 mL/min (ref >60)
Glucose, Bld: 103 mg/dL — ABNORMAL HIGH (ref 70–99)
Glucose, Bld: 120 mg/dL — ABNORMAL HIGH (ref 70–99)
Glucose, Bld: 139 mg/dL — ABNORMAL HIGH (ref 70–99)
Potassium: 4.1 mEq/L (ref 3.5–5.1)

## 2011-02-05 LAB — CBC
HCT: 32.1 % — ABNORMAL LOW (ref 36.0–46.0)
MCHC: 33.9 g/dL (ref 30.0–36.0)
MCHC: 34 g/dL (ref 30.0–36.0)
MCHC: 34.3 g/dL (ref 30.0–36.0)
MCV: 93.2 fL (ref 78.0–100.0)
MCV: 93.9 fL (ref 78.0–100.0)
Platelets: 152 10*3/uL (ref 150–400)
Platelets: 160 10*3/uL (ref 150–400)
Platelets: 161 10*3/uL (ref 150–400)
Platelets: 171 10*3/uL (ref 150–400)
RBC: 3.2 MIL/uL — ABNORMAL LOW (ref 3.87–5.11)
RDW: 13.5 % (ref 11.5–15.5)
RDW: 13.7 % (ref 11.5–15.5)
RDW: 13.9 % (ref 11.5–15.5)
WBC: 10.7 10*3/uL — ABNORMAL HIGH (ref 4.0–10.5)
WBC: 9.2 10*3/uL (ref 4.0–10.5)

## 2011-02-05 LAB — PROTIME-INR
INR: 1.6 — ABNORMAL HIGH (ref 0.00–1.49)
INR: 1.7 — ABNORMAL HIGH (ref 0.00–1.49)
INR: 2 — ABNORMAL HIGH (ref 0.00–1.49)
Prothrombin Time: 15.6 seconds — ABNORMAL HIGH (ref 11.6–15.2)
Prothrombin Time: 21.1 seconds — ABNORMAL HIGH (ref 11.6–15.2)
Prothrombin Time: 23.4 seconds — ABNORMAL HIGH (ref 11.6–15.2)

## 2011-02-05 LAB — ABO/RH: ABO/RH(D): O POS

## 2011-02-05 LAB — TYPE AND SCREEN: ABO/RH(D): O POS

## 2011-02-06 LAB — COMPREHENSIVE METABOLIC PANEL
ALT: 15 U/L (ref 0–35)
AST: 20 U/L (ref 0–37)
Albumin: 3.8 g/dL (ref 3.5–5.2)
CO2: 28 mEq/L (ref 19–32)
Chloride: 105 mEq/L (ref 96–112)
Creatinine, Ser: 0.7 mg/dL (ref 0.4–1.2)
GFR calc Af Amer: >60 mL/min (ref >60)
GFR calc non Af Amer: >60 mL/min (ref >60)
Potassium: 5 mEq/L (ref 3.5–5.1)
Sodium: 140 mEq/L (ref 135–145)
Total Bilirubin: 0.5 mg/dL (ref 0.3–1.2)

## 2011-02-06 LAB — CBC
MCV: 92.9 fL (ref 78.0–100.0)
Platelets: 215 10*3/uL (ref 150–400)
RBC: 4.52 MIL/uL (ref 3.87–5.11)
WBC: 6.8 10*3/uL (ref 4.0–10.5)

## 2011-02-06 LAB — URINALYSIS, ROUTINE W REFLEX MICROSCOPIC
Bilirubin Urine: NEGATIVE
Glucose, UA: NEGATIVE mg/dL
Nitrite: NEGATIVE
Specific Gravity, Urine: 1.024 (ref 1.005–1.030)
pH: 6.5 (ref 5.0–8.0)

## 2011-02-06 LAB — APTT: aPTT: 27 seconds (ref 24–37)

## 2011-02-18 ENCOUNTER — Encounter: Payer: Self-pay | Admitting: Internal Medicine

## 2011-02-20 ENCOUNTER — Ambulatory Visit: Payer: Self-pay | Admitting: Internal Medicine

## 2011-03-11 NOTE — Assessment & Plan Note (Signed)
[facility]                            CARDIOLOGY OFFICE NOTE   NAME:Yoder, Crystal SHACKLEFORD                       MRN:          [id number]  DATE:06/01/2008                            DOB:          October 29, 1945    IDENTIFICATION:  Crystal Yoder is a 65 year old woman with a history of  nonischemic cardiomyopathy.  Last echocardiogram in July of last year  showed an LVEF of 50-55%.  I last saw her in March.  Also, has a history  of obstructive sleep apnea, DJD, GE reflux, and status post splenectomy.   Since, seen she continues to take activities as tolerated, limited by  arthritis in feet, arthritis in knee.  She does complain of shortness of  breath with hills.  Also, being evaluated for possible hysterectomy and  a bladder procedure, possibly later in the winter.   CURRENT MEDICINES:  1. Coreg 3.125 b.i.d.  2. Atacand 4 mg.  3. Multivitamin.  4. Fish oil.  5. Borage and MSM daily.  Note, the patient is out off Atacand.   PHYSICAL EXAMINATION:  GENERAL:  The patient is in no distress.  VITAL SIGNS:  Blood pressure 142/80, pulse is 80 and regular, weight  259, up 1 pound from last visit.  LUNGS:  Clear.  NECK:  JVP is normal.  CARDIAC:  Regular rate and rhythm.  S1 and S2.  No S3.  No significant  murmurs.  PMI not displaced.  ABDOMEN:  Benign.  EXTREMITIES:  No edema.   A 12-lead EKG, normal sinus rhythm, 73 beats per minute.  LVH.  Nonspecific ST changes.   IMPRESSION:  1. Nonischemic cardiomyopathy.  Volume status overall looks pretty      good.  I would get a BMET and a BNP today.  Continue on current      regimen.  She is out of the Atacand, but if her blood pressure      remains high on it, she should go up on the dose of it or the Coreg      and I have told her this to get maximal medical benefit.  I will      set up for an echocardiogram to reevaluate LV function.  If her LV      function is normal or minimally decreased, I think she is at  low      risk and okay to proceed with the planned gynecologic  procedure.  2. Hypertension as noted above.  3. Health care maintenance.  Encouraged her to stay as active as she      can, watch what she eats.  Note last lipid panel done in 2008, LDL      was 123.  This will need to be followed.  I will leave to Dr.      Cliffton Asters, but if it does not come down, she should be on a statin.   I will set followup for 6 months.     Pricilla Riffle, MD, [facility]  Electronically Signed    PVR/MedQ  DD: 06/01/2008  DT: 06/02/2008  Job #: [id number]  cc:   Stacie Acres. Cliffton Asters, M.D.

## 2011-03-11 NOTE — Assessment & Plan Note (Signed)
[facility]                            CARDIOLOGY OFFICE NOTE   NAME:Strollo, TAVIA STAVE                       MRN:          [id number]  DATE:05/28/2007                            DOB:          06/12/1946    IDENTIFICATION:  Ms. Emry is a 65 year old woman with a history of  nonischemic cardiomyopathy.  She was last seen in cardiology clinic back  in April.   Since seen, the patient underwent D&C and tolerated it well.  She denies  shortness of breath.  No chest pain.  Is planning to go to the beach.   CURRENT MEDICATIONS:  1. Coreg 3.125 b.i.d.  2. Atacand 4.  3. Multivitamin.  4. Fish oil.  5. Borage.  6. MSM.  7. Bee pollen.   PHYSICAL EXAM:  The patient is in no distress.  Blood pressure initially 122/90, on my check 122/88.  Pulse is 76,  weight 251.  LUNGS:  Clear.  NECK:  JVP is normal.  CARDIAC:  Regular rate and rhythm.  S1, S2.  No S3.  ABDOMEN:  Benign.  No hepatomegaly.  EXTREMITIES:  Trace edema.   IMPRESSION AND PLAN:  1. Echocardiogram done on July 25, LV function was 50-55%.  Would      continue on current regimen.  2. Health care maintenance.  Last lipid panel January 27, HDL 63,      total cholesterol 203.  Will need to make sure that she has      followup.   Otherwise, I will set up to see the patient back in 6 months, sooner if  problems develop.     Pricilla Riffle, MD, [facility]  Electronically Signed    PVR/MedQ  DD: 05/28/2007  DT: 05/29/2007  Job #: [id number]   cc:   Stacie Acres. Cliffton Asters, M.D.

## 2011-03-11 NOTE — Op Note (Signed)
NAMEMarland Yoder  SHIVALI, QUACKENBUSH                ACCOUNT NO.:  [id number]   MEDICAL RECORD NO.:  [id number]          PATIENT TYPE:  AMB   LOCATION:  SDC                           FACILITY:  [facility]   PHYSICIAN:  [name], M.D.   DATE OF BIRTH:  01/17/1946   DATE OF PROCEDURE:  03/19/2007  DATE OF DISCHARGE:                               OPERATIVE REPORT   PREOPERATIVE DIAGNOSIS:  1. Recurrent postmenopausal bleeding.  2. Uterovaginal prolapse.   POSTOPERATIVE DIAGNOSIS:  1. Recurrent postmenopausal bleeding.  2. Endometrial polyp.  3. Endocervical polyp.  4. Third degree uterovaginal prolapse.   PROCEDURE:  Diagnostic hysteroscopy, fractional dilation and curettage,  hysteroscopic endometrial polypectomy, cervical polypectomy.   SURGEON:  Conley Simmonds, M.D.   ANESTHESIA:  LMA, local with paracervical block of 1% lidocaine.   IV FLUIDS:  1000 mL Ringer's lactate.   ESTIMATED BLOOD LOSS:  Minimal.   URINE OUTPUT:  50 mL by in-and-out catheterization.   COMPLICATIONS:  None.   INDICATIONS FOR PROCEDURE:  The patient is a 60-year para 1 Caucasian  female who has a history of recurrent postmenopausal bleeding.  The  patient has undergone endometrial biopsies in the past which have been  benign and documented scanty endometrial tissue.  When the patient  represented with postmenopausal bleeding this year she underwent an  ultrasound in January which documented an endometrial stripe of 0.81 cm.  There were two fibroids visible.  One measured 0.97 cm and was located  anteriorly and the other was 0.94 cm and was possibly submucosal.  The  ovaries were within normal limits.  There was no free fluid appreciated.  The patient has not been treated with hormone replacement therapy.  The  patient has a known history of uterine and vaginal prolapse and she  desires treatment for the postmenopausal bleeding only at this time.  Due to professional reasons she chosen to wait until this time to  proceed with surgery and as her endometrial biopsies and ultrasound were  essentially unchanged from one a few years prior. A decision was made to  proceed now at this time with a hysteroscopy D&C and possible  polypectomy.  Risks, benefits, and alternatives have been discussed.   FINDINGS:  Examination under anesthesia revealed third degree uterine  prolapse and a third degree cystocele.  The uterus was noted to be small  and there were no adnexal masses appreciated.  There was a visible  endocervical polyp.  There were no vaginal lesions.   Hysteroscopy documented a 0.75 cm anterior fundal midline polyp which  had a large base on it.  There were no other lesions noted within the  uterine cavity.  Both of the tubal ostia were visualized well.  There  were no endocervical lesions.   SPECIMEN:  Endometrial curettings, endocervical curettings, endometrial  polyp, and an endocervical polyp were all sent to pathology separately.   PROCEDURE:  The patient was reidentified in the preoperative hold area.  She did receive antibiotics of clindamycin 900 mg IV and gentamicin 120  mg IV for antibiotic prophylaxis.  The patient received TED  hose and PAS  stockings for DVT prophylaxis.   In the operating room, an LMA anesthetic was induced and the patient was  then placed in the dorsal lithotomy position.  The vagina and perineum  were then sterilely prepped and the bladder was catheterized of urine.  The patient was then sterilely draped.   An exam under anesthesia was performed.   The patient's third degree uterovaginal prolapse was appreciated.  A  single-tooth tenaculum was placed on the anterior cervical lip.  A  paracervical block was performed with a total of 9.5 mL of 1% lidocaine.  The uterus was sounded to 7 cm.  The cervix was then dilated to a number  21 Pratt dilator.  The diagnostic hysteroscope was then inserted into  the uterine cavity under the continuous installation of fluid.   The  findings are as noted above.  The hysteroscope was removed.  Curettage  of the endocervix was performed.  Curettage of the endometrial cavity  was then performed with both a serrated and a smooth curette.   The hysteroscope was then reinserted and there was evidence still of the  endometrial polyp.  The cervix was then further dilated to a #29 Pratt  dilator and the resectoscope was inserted again and under the continuous  installation of fluid, the specimen was actually noted to be almost 100%  freed at this time.  This specimen was captured within the loop attached  to the hysteroscope.  A small amount of cautery energy was used to  assure that the polyp was completely freed from the surrounding  endometrium.  The hysteroscope and the specimen were then removed  simultaneously.  The specimen was set aside and sent to pathology.   Finally the endocervical polyp was sharply excised and again sent to  pathology separately.  The site of the cervical polypectomy was then  made hemostatic with silver nitrate sticks.  Hemostasis was noted to be  good.   This concluded the patient's procedure.  There were no complications.  All needle, instrument, sponge counts were correct.  The patient is  escorted to the recovery room in stable and awake condition.      [name], M.D.  Electronically Signed     BES/MEDQ  D:  03/19/2007  T:  03/19/2007  Job:  [id number]   cc:   Pricilla Riffle, MD, [facility]  [address]  [address]  [place]  [place] [address]   Stacie Acres. Cliffton Asters, M.D.  Fax: [phone]

## 2011-03-11 NOTE — Assessment & Plan Note (Signed)
 [facility]                            CARDIOLOGY OFFICE NOTE   NAME:Cincotta, MERCIA DOWE                       MRN:          [id number]  DATE:12/30/2007                            DOB:          [birth year]    IDENTIFICATION:  Crystal Yoder is a 65 year old woman with history of  nonischemic cardiomyopathy with only residual mild LV dysfunction, LVEF  by echo in July of last year 50-55%.  She also has a history of  obstructive sleep apnea, status post splenectomy, arthritis, GE reflux.   Since seen, she continues to work as a Midwife.  She gets  occasionally short of breath.  She is under increased stress with her  students, wonders if this is harmful to her heart.   CURRENT MEDICATION:  1. Carvedilol 3.125 b.i.d.  2. Atacand 4.  3. Multivitamin daily.  4. Fish oil daily.  5. Borage 1300 mg daily.  6. MSM 1 gram daily.   PHYSICAL EXAMINATION:  GENERAL:  The patient is in no distress.  VITAL SIGNS:  Blood pressure is 130/70 pulse is 74 and regular, weight  is 258 which is up from 253 when I saw her.  LUNGS:  Relatively clear.  CARDIAC:  Regular rate and rhythm, S1-S2 no S3.  ABDOMEN:  Benign.  No hepatomegaly.  EXTREMITIES:  No edema.   IMPRESSION:  1. Cardiomyopathy.  The patient has recovered with only minimal      dysfunction.  I would keep around the same medicines.  Volume      status looks adequate.  Again, watch her fluids and watch for any      additional swelling.  2. Health care maintenance.  Lipid panel from August of last year, LDL      is 123, triglycerides 85, HDL of 70.  I would again aggressively      treat given her history of previous LV dysfunction to avoid any      other problems.  I will have her get of lipo med check through her      family doctor and faxed here in the end of the summer.  3. Again encouraged her to stay active.  Watch her weight, try to cut      back on things, calories to try to pull her weight  down.    Pricilla Riffle, MD, [facility]  Electronically Signed   PVR/MedQ  DD: 12/30/2007  DT: 12/31/2007  Job #: [id number]   cc:   [facility]

## 2011-03-11 NOTE — Progress Notes (Signed)
[facility]                        PERIPHERAL VASCULAR OFFICE NOTE   NAME:Inclan, CHRYS LANDGREBE                       MRN:          [id number]  DATE:04/01/2007                            DOB:          [birth year]    HISTORY OF PRESENT ILLNESS:  Ms. 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  She has some orthopedic problems with  her left foot.  She presents today at her own request for followup of  pain in her foot.  She has previously seen Dr. Arlys John D. Petrinitz of  podiatry for this.  He had prescribed an orthotic, which she has rarely  used.  She has not had any significant edema.  The varicosities are  unchanged from previous.   CURRENT MEDICATIONS:  1. Coreg 3.125 mg twice daily.  2. Atacand 4 mg daily.  3. Multivitamin.  4. Fish oil.   PHYSICAL EXAMINATION:  GENERAL:  She is obese but otherwise well-  appearing with heart rate 70, blood pressure 98/76 and weight of 254  pounds.  She has no jugular venous distention, thyromegaly or  lymphadenopathy.  Respiratory effort is normal.  LUNGS:  Clear to auscultation.  CARDIAC:  She has a non-palpable point of maximal cardiac impulse.  There is a regular rate and rhythm without murmur, rub, gallop.  ABDOMEN:  Soft, nondistended, nontender.  There is no  hepatosplenomegaly.  Bowel sounds are normal.  EXTREMITIES:  Warm without edema.  There are varicosities bilaterally,  which are small.  Some moderate deformity of the metatarsal joint on the  right foot, which is the site of the pain.   IMPRESSION/RECOMMENDATIONS:  1. I assured her that foot pain is due to her orthopedic problem and      encouraged compliance with the recommendations of Dr. Wynelle Cleveland.  2. Exercise options:  The patient tells me she wishes to exercise and      says this is  the reason for her visit.  I suggested swimming, as      this would not require and weightbearing on the sore foot.  3. Varicosities:  Unchanged.     [name], MD  Electronically Signed   WED/MedQ  DD: 04/01/2007  DT: 04/01/2007  Job #: [id number]   cc:   Pricilla Riffle, MD, [facility]  Clinton D. Maple Hudson, MD, FCCP, FACP

## 2011-03-11 NOTE — Op Note (Signed)
NAMEMarland Kitchen  Crystal Yoder, Crystal Yoder                ACCOUNT NO.:  [id number]   MEDICAL RECORD NO.:  [id number]          PATIENT TYPE:  INP   LOCATION:  5024                         FACILITY:  [facility]   PHYSICIAN:  [name], M.D. DATE OF BIRTH:  01-17-1946   DATE OF PROCEDURE:  DATE OF DISCHARGE:                               OPERATIVE REPORT   PREOPERATIVE DIAGNOSES:  End-stage degenerative arthritis, right knee;  varus alignment.   POSTOPERATIVE DIAGNOSIS:  End-stage degenerative arthritis, right knee;  varus alignment.   PROCEDURE:  Right total knee replacement utilizing Stryker triathlon  prosthesis.  Soft tissue balancing medial capsule release.  Cemented #5  femoral component.  #5 tibial component and 11-mm polyethylene insert.  Resurfacing pegged medial offset cemented 35-mm patellar component.   SURGEON:  [name], MD   ASSISTANT:  Genene Churn. Barry Dienes, [place], present throughout the entire case and  necessary for timely completion of the procedure.   ANESTHESIA:  General   BLOOD LOSS:  Minimal.   SPECIMENS:  None.   COMPLICATIONS:  None.   PROCEDURE:  Soft compressive with knee immobilizer.   DRAIN:  Hemovac x1.   TOURNIQUET TIME:  One hour 15 minutes.   PROCEDURE IN DETAIL:  The patient was brought to the operating room,  placed on the operating table in supine position.  After adequate  anesthesia was obtained, right knee examined.  Varus alignment  correctable neutral.  Full extension flexion about 100 degrees limited  by adiposity of the back of her thigh.  Tourniquet applied.  Prepped and  draped in usual sterile fashion.  Exsanguinated with elevation Esmarch.  Tourniquet inflated to 350 mmHg.  Straight incision above the patella  down to tibial tubercle.  Medial arthrotomy vastus splitting at the top.  Knee exposed.  Medial capsular release.  Grade 4 changes.  Intramedullary guide of the femur.  Distal cut 10 mm set at 5 degrees of  valgus.  Using epicondylar axis  sized, cut, and fitted for #5 component  on the femur.  Proximal tibial exposed.  Extramedullary guide 3-degree  posterior slope cut below the medial defect.  Sized #5 component.  With  trials in place #5 above and below and a 11-mm insert, nicely balanced  knee with full extension, full flexion, good alignment, good stability,  good correction of mechanical axis.  With trials, tibia was marked for  appropriate rotation and then hand reamed after trials removed.  Patella  exposed.  Posterior 10 mm removed.  Sized, drilled, and fitted for a 35-  mm component, which had good tracking at completion with trials.  All  trials removed.  Copious irrigation with pulse irrigating device.  Cement prepared and placed on all components, firmly seated.  11-mm  insert attached to tibia.  Knee reduced.  Once cement hardened,  reexamined.  Pleased with alignment, stability, and tracking.  Wound  irrigated.  Hemovac placed through a separate stab wound  superolaterally.  Arthrotomy closed with #1 Vicryl.  Skin and  subcutaneous tissue with Vicryl and staples.  Knee injected with  Marcaine.  Hemovac clamp.  Sterile  compressive dressing applied.  Tourniquet was inflated and removed.  Knee immobilizer applied.  Anesthesia reversed.  Brought to the recovery room.  Tolerated surgery  well.  No complications.   PREOPERATIVE DIAGNOSES:  Chronic lateral epicondylitis, right elbow.  Tear in extensor carpi radialis brevis tendon.   POSTOPERATIVE DIAGNOSES:  Chronic lateral epicondylitis, right elbow.  Tear in extensor carpi radialis brevis tendon.   PROCEDURE:  Right elbow exploration, debridement, drilling, repair  laterally.   SURGEON:  [name], MD   ASSISTANT:  Genene Churn. Barry Dienes, [place]   ANESTHESIA:  General   BLOOD LOSS:  Minimal.   SPECIMENS:  None.   CULTURES:  None.   COMPLICATIONS:  None.   DRESSINGS:  Sterile compressive with a sugar-tong splint.   TOURNIQUET TIME:  45 minutes.    PROCEDURE IN DETAIL:  The patient was brought to the operating room,  placed on the operating table in supine position.  After adequate  anesthesia been obtained, I did get fluoroscopic views just to confirm  there was no calcification laterally.  That was confirmed.  Tourniquet  applied.  Prepped and draped in usual sterile fashion.  Exsanguinated  with elevation Esmarch.  Tourniquet inflated to 250 mmHg.  Straight  incision from the lateral epicondyle distally.  Skin and subcutaneous  tissue divided.  Superficial extensors intact, divided longitudinally.  As soon as these were opened, marked mucinous degeneration extensive  tearing ECRB tendon with the capsule degenerative is well.  Numerous  holes going down in the joint.  All abnormal tissue debrided down to the  level of the radial head.  Thoroughly irrigated.  Elbow joint inspected.  Epicondyle debrided and treated with multiple drilling.  Irrigated once  again.  Once that was complete, I then reapproximated superficial  extensors over the defect  over the epicondyle with a running 0 Vicryl.  Skin and subcutaneous  tissue closed with Vicryl.  Margins were injected with Marcaine.  Sterile compressive dressing applied.  Sugar-tong splint applied.  Anesthesia reversed.  Brought to the recovery room.  Tolerated surgery  well.  No complications.      [name], M.D.  Electronically Signed     DFM/MEDQ  D:  02/01/2009  T:  02/02/2009  Job:  [id number]

## 2011-03-11 NOTE — Assessment & Plan Note (Signed)
[facility]                            CARDIOLOGY OFFICE NOTE   NAME:Mcwhirter, Crystal Yoder                       MRN:          [id number]  DATE:08/26/2007                            DOB:          1945/11/17    Crystal Yoder is a 65 year old woman with history of nonischemic  cardiomyopathy.  She was last seen in clinic in August.  The patient  comes back today sooner.  She is in distress because of her job, very  stressful this year.  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 She has  appealed for help but is not getting it.  She is tired.  She is carrying  more than a usual load of work.  She has had some URI symptoms with  cough and yellow-greenish sputum since September.  She has taken two  courses of Z pack.  Denies any fever or chills.   The patient's breathing otherwise is stable.  She denies PND, no chest  pain.   CURRENT MEDICATIONS:  1. Coreg 3.125 b.i.d.  2. Atacand 4.  3. Multivitamins p.r.n.  4. Fish oil p.r.n.  5. Borage p.r.n.  6. MSM p.r.n.  7. Bee pollen p.r.n.   PHYSICAL EXAMINATION:  GENERAL:  The patient is in some distress.  She  has been tearful, crying about her work.  She actually would like to  have some form  sent so she can get rid of some of her stress at work.  VITAL SIGNS:  Blood pressure is 128/82, pulse 74 and regular, weight  253, stable.  LUNGS:  Clear without rales or wheezes.  CARDIAC:  Regular rate and rhythm.  S1 and S2.  No S3.  No significant  murmurs.  ABDOMEN:  Obese, benign.  EXTREMITIES:  No edema.   IMPRESSION:  1. Cardiomyopathy, nonischemic.  Last echocardiogram in July, her left      ventricular function was improved at 50-55%.  I would keep her on      this regimen for now.  2. Stress.  I know it is difficult for her at work and I cannot      provide her with an excuse form for work.  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  3.  Sinus/upper respiratory.  The patient now has been on two courses      of antibiotics.  She does not appear to be septic or it sounds like      she may have a low grade infection.  I would recommend she follow      up with [facility].  Indeed, she may have need to have a sputum      culture done since she has seen antibiotics in the near past.   We will keep her February followup time, sooner if problems develop.   Her EKG shows normal sinus rhythm, left anterior fascicular block, left  ventricular hypertrophy.     Pricilla Riffle, MD, [facility]  Electronically Signed  PVR/MedQ  DD: 08/26/2007  DT: 08/27/2007  Job #: [id number]

## 2011-03-11 NOTE — Assessment & Plan Note (Signed)
[facility]                             PULMONARY OFFICE NOTE   NAME:Crystal Yoder, Crystal Yoder                       MRN:          [id number]  DATE:08/27/2007                            DOB:          1946-02-05    PROBLEM:  1. Obstructive sleep apnea.  2. Rhinosinusitis.  3. Splenectomy.  4. Arthritis.  5. Exogenous obesity.  6. Nonischemic cardiomyopathy.  7. Distal esophageal thickening/esophageal reflux.  8. Peripheral venous insufficiency.   HISTORY:  She is working again as a Midwife, recognizing  the viral illnesses this exposes her to.  She believes she is up to date  on her pneumococcal vaccine.  Followed for primary care at [facility]  [facility].  Recent increased bronchitis.  She took Z-Paks in  September and October.  Wakes now with head congestion, no sore throat.  She uses her BiPAP inspiratory 14, expiratory 8 reliably and has been  able to continue this.  Says sputum now is green, main complaint is  lingering cough all Fall.   MEDICATION:  1. Coreg 3.125 mg b.i.d.  2. Atacand 4 mg.  3. Bee pollen.   DRUG INTOLERANT:  1. PENICILLIN.  2. SULFA.   OBJECTIVE:  Weight 259 pounds, BP 134/98, pulse 73, room air saturation  94%.  Cough and active gag.  I cannot tell that her throat is red.  There is no stridor, no evidence adenopathy.  Mild nasal congestion.  No  wheeze or rhonchi.   IMPRESSION:  Bronchitis status post splenectomy.  We discussed  symptomatic control versus antibiotics and are going to watch for  clinical clearing at this point.  She is given sample Advair 500/50 one  puff twice daily.  Influenza vaccine discussed and given.  Benzonatate  100 milligrams every 6 hours as needed for trial, fluids.  I discussed  parameters by which we would  decide on an antibiotic and she will call back if she does not clear  pretty completely on the  Advair.  Schedule return in 1 year, earlier as needed.  She was  comfortable with this approach.  She will continue her bilevel positive  airway pressure.     Clinton D. Maple Hudson, MD, [facility] Bollman, FACP  Electronically Signed    CDY/MedQ  DD: 08/29/2007  DT: 08/30/2007  Job #: [id number]

## 2011-03-11 NOTE — Assessment & Plan Note (Signed)
[facility]                            CARDIOLOGY OFFICE NOTE   NAME:Malkin, Crystal Yoder                       MRN:          [id number]  DATE:12/15/2008                            DOB:          08-17-1946    IDENTIFICATION:  [name] is a 65 year old woman with a nonischemic  cardiomyopathy, dyslipidemia, and hypertension.  I last saw her back in  August.   In the interval, she has done well from a cardiac standpoint.  Her  breathing is okay.  Denies chest pain.  She is bothered by her  arthritis, particularly in her knee and feet.   CURRENT MEDICINES:  1. Carvedilol 3.125 b.i.d.  2. Atacand 4 mg daily.  3. Multivitamin daily.  4. Fish oil daily.  5. Borage 1300 mg daily.  6. MSM.   PHYSICAL EXAMINATION:  GENERAL:  The patient is in no distress at rest.  VITAL SIGNS:  Blood pressure is 108/72, pulse is 60 and regular, weight  253 down 6 pounds from previous.  NECK:  No bruits.  JVP appears normal.  LUNGS:  Clear to auscultation without rales or wheezes.  HEENT:  Normocephalic and atraumatic.  Mucous membranes moist.  CARDIAC:  Regular rate and rhythm.  S1 and S2.  No S3.  No murmurs.  PMI  not displaced.  ABDOMEN:  Supple and nontender.  Normal bowel sounds.  No hepatomegaly.  EXTREMITIES:  No edema.  Good distal pulses.   IMPRESSION:  1. Cardiomyopathy.  The patient at one point had a left ventricular      ejection fraction in the 30s to 40s.  This has improved and      actually left ventricular ejection fraction on last echo in August      2009, showed an left ventricular ejection fraction of 55%.  She is      doing well clinically.  I would keep her on the same medicines.  2. Hypertension, good control, would continue, denies dizziness.  3. Dyslipidemia.  Last lipid panel done at [facility],      total cholesterol 215 with an LDL of 137 and HDL of 83.  I would      treat aggressively given her cardiac history, she was trying to  do      diet.  She is now on  Weight Watchers.  She will check in late      summer again.  I will discuss the results when I see them.   I encouraged to be active.  Again, her knee and feet are limiting her.  If she requires any sort of surgical evaluation, I think, she can  proceed without having further testing, and based on how she is doing on  her last echocardiogram.  Again, she has no known coronary artery  disease.   I will set to see the patient on October, sooner if problems develop.      Pricilla Riffle, MD, [facility]  Electronically Signed    PVR/MedQ  DD: 12/16/2008  DT: 12/17/2008  Job #: [id number]

## 2011-03-13 ENCOUNTER — Other Ambulatory Visit: Payer: Self-pay | Admitting: Family Medicine

## 2011-03-13 DIAGNOSIS — Z1231 Encounter for screening mammogram for malignant neoplasm of breast: Secondary | ICD-10-CM

## 2011-03-14 NOTE — Assessment & Plan Note (Signed)
[facility]                            CARDIOLOGY OFFICE NOTE   NAME:Laur, PEOLA JOYNT                       MRN:          [id number]  DATE:02/11/2007                            DOB:          16-Sep-1946    CARDIOLOGIST:  Dr. Dietrich Pates   PRIMARY CARE PHYSICIAN:  Dr. Laurann Montana   HISTORY OF PRESENT ILLNESS:  Ms. Crystal Yoder is a very pleasant 65 year old  female patient followed by Dr. Tenny Craw with a history of non-ischemic  cardiomyopathy with EF as low as 20%, now improved to an EF of 50% by  recent echocardiogram November 2007, who presents to the office today  for surgical clearance.  She is in need of a D&C.  It sounds as though  she has had some dysfunctional uterine bleeding and thickened  endometrium on recent ultrasound.  The procedure is to be done by Dr.  Edward Jolly.  From a cardiovascular standpoint she is stable.  She denies  chest pain.  She denies any orthopnea or paroxysmal dyspnea.  She has  chronic lower extremity edema that seems to be unchanged.  She does note  dyspnea on exertion.  This seems to be chronic.  She notes that this is  actually improved over the last year, since we have been treating her  for non-ischemic cardiomyopathy.  She does have some bilateral foot  problems.  She apparently has osteoarthritis and is being followed by a  podiatrist.  She had been evaluated by Dr.  Samule Ohm at one point in  peripheral vascular consultation.  It was felt that she had lower  extremity varicosities that were of a benign nature.   CURRENT MEDICATIONS:  1. Coreg 3.125 mg b.i.d.  2. Atacand 4 mg daily.  3. Multivitamin.  4. Fish oil.  5. Borage 1300 mg daily.  6. MSM 1000 mg daily.  7  Bee pollen.   ALLERGIES:  PENICILLIN and SULFA   SOCIAL HISTORY:  She denies any tobacco or alcohol abuse.   REVIEW OF SYSTEMS:  Please see HPI. Denies any fevers, chills, cough,  melena, hematochezia, hematuria, dysuria.  The rest of the review of  systems are negative.   PHYSICAL EXAMINATION:  She is a well-nourished, well-developed female in  no distress.  Blood pressure is 118/70, pulse 60, weight 256.12 pounds.  HEAD:  Normocephalic, atraumatic.  Eyes PERRLA.  Sclerae clear.  NECK:  Without JVD.  LYMPH:  Without lymphadenopathy.  CARDIAC:  Normal S1, S2.  Regular rate and rhythm.  LUNGS:  Are clear to auscultation bilaterally without wheeze, rhonchi or  rales.  ABDOMEN:  Soft, nontender.  EXTREMITIES:  With 1+ edema.  Bilateral calves are soft, nontender.  SKIN:  Is warm and dry.  NEUROLOGIC:  She is alert and oriented x3.  Cranial nerves II through  XII grossly intact.   Electrocardiogram reveals sinus rhythm with a heart rate of 60 with left  axis deviation.  Nonspecific intraventricular conduction delay, no acute  changes.   IMPRESSION:  1. Non-ischemic cardiomyopathy with an ejection fraction of 20%.      a.  Improved ejection fraction to 50% by recent echocardiogram       September 07, 2006.  2. Normal coronaries by catheterization May 2007.  3. Recent history of dysfunctional uterine bleeding.      a.     Needs dilatation and curettage.  4. Obstructive sleep apnea on CPAP.  5. History of splenectomy.  6. Osteoarthritis.  7. Morbid obesity.  8. Venous insufficiency.  9. History of distal esophageal thickening/esophageal reflux.   PLAN:  The patient presents to the office today for preoperative  clearance for her upcoming D&C.  From a cardiovascular standpoint she is  doing well.  According to [facility] and AHA guidelines she requires no further  cardiac workup prior to the non-cardiac procedure.  She should be at an  acceptable risk.  Our service is certainly available in the peri-  operative period as necessary.  I discussed her case today with Dr. Tenny Craw  who agreed.  Dr. Tenny Craw also saw the patient today.      Tereso Newcomer, PA-C  Electronically Signed      Pricilla Riffle, MD, [facility]  Electronically  Signed   SW/MedQ  DD: 02/11/2007  DT: 02/12/2007  Job #: [id number]   cc:   Stacie Acres. Cliffton Asters, M.D.  [name], M.D.

## 2011-03-14 NOTE — Discharge Summary (Signed)
[place]  Patient:    Crystal Yoder, Crystal Yoder                         MRN: [id number] Adm. Date:  [id number] Disc. Date: [id number] Attending:  Colbert Ewing Dictator:   Oris Drone. Petrarca, P.A.-C.                           Discharge Summary  ADMISSION DIAGNOSIS:  Advanced degenerative joint disease of her left knee.  DISCHARGE DIAGNOSES: 1. Advanced degenerative joint disease of her left knee. 2. Sleep apnea. 3. Exogenous obesity.  PROCEDURES:  Left total knee replacement.  HISTORY OF PRESENT ILLNESS:  This is a very pleasant, 65 year old, married, white female with a several year history of left knee pain.  She complained initially of hurting her left knee in a fall into a snake hole where she fell up to her knee.  Three years ago, she had arthroscopic debridement with transient relief.  She has had persistent pain and limp and now is indicated for total joint replacement.  HOSPITAL COURSE:  A 65 year old white female admitted on March 29, 2001, after appropriate laboratory studies were obtained, as well as 1 g of vancomycin IV on call to the operating room.  She was taken to the operating room where she underwent a left total knee replacement.  She tolerated the procedure well. She was continued postoperatively on vancomycin 1 g IV q.12h. x 3 doses.  She had an epidural placed postoperatively for pain control.  A Foley was also placed intraoperatively.  She was begun on heparin 5000 units subcu q.12h. until her Coumadin became therapeutic as per protocol.  PT and OT consults were ordered.  She was placed on a CPM machine from 0-90 degrees for eight hours per day.  She was allowed to ambulate with weightbearing as tolerated on the left with a walker or crutches.  A hemoglobin A1C was ordered.  She did have Dr. Maple Hudson, who specializes in sleep apnea, follow her also.  He chose to use Afrin one spray to each nostril h.s., as well as Flonase two  sprays to each nostril h.s.  Her IV was hep locked after discontinuation of the epidural.  Her dressing was changed on March 31, 2001, and Hemovacs were pulled. Her wound was benign.  There was a consultation made to rehabilitation. However, her insurance was not amenable to her transfer.  Therefore, she continued in her hospital stay until April 05, 2001, and once she was ambulatory and stable was discharged to follow back up with [place] in one week for recheck evaluation.  LABORATORY DATA:  EKG showed normal sinus rhythm, left axis deviation, and left ventricular hypertrophy.  The left knee of March 29, 2001, revealed satisfactory position and alignment following left total knee replacement.  The preoperative hemoglobin was 13.7, hematocrit 40.8%, white count 6800, and platelets 274,000.  Discharge hemoglobin 10.4 and hematocrit 31.4%. Preoperative chemistries:  Sodium 140, potassium 4.8, chloride 104, CO2 30, glucose 135, BUN 16, creatinine 0.8, calcium 9.1, total protein 7.1, albumin 3.5, AST 20, ALT 16, ALP 74, total bilirubin 0.6.  Discharge sodium 142, potassium 4.4, chloride 102, CO2 34, glucose 122, BUN less than 3, creatinine 0.6, and calcium 8.7.  The glycosylated hemoglobin A1C was 5.6. The urinalysis was benign for a voided urine.  Blood type was O+.  Antibody screen negative.  DISCHARGE MEDICATIONS: 1. Given a  prescription for Percocet 5/325 mg one to two tablets q.4h. p.r.n.    pain. 2. Coumadin to take as directed by home health. 3. Colace 100 mg p.o. b.i.d. 4. Keflex 500 mg one tablet q.i.d. 5. Nu-Iron tablet b.i.d.  ACTIVITY:  She will use her CPM at home from 0-50 degrees, increasing 10 degrees a day.  DIET:  No restrictions on her diet.  WOUND CARE:  Keep her wound clean and dry.  Call if she has any problems with signs of infection.  FOLLOW-UP:  Home health will do her physical therapy, as well as her blood draws for the Coumadin.  She will follow back up with [place]  in one week for recheck evaluation.  DISPOSITION:  She was discharged in improved condition. DD:  05/17/01 TD:  05/18/01 Job: [id number]

## 2011-03-14 NOTE — Discharge Summary (Signed)
NAMEMAURY, BAMBA                ACCOUNT NO.:  [id number]   MEDICAL RECORD NO.:  [id number]          PATIENT TYPE:  INP   LOCATION:  5024                         FACILITY:  [facility]   PHYSICIAN:  [name], M.D. DATE OF BIRTH:  15-Jul-1946   DATE OF ADMISSION:  01/31/2009  DATE OF DISCHARGE:  02/04/2009                               DISCHARGE SUMMARY   FINAL DIAGNOSES:  1. Status post right total knee replacement for end-stage degenerative      joint disease.  2. Cardiomyopathy.  3. Hypertension.  4. Dyslipidemia.  5. Sleep apnea.   HISTORY OF PRESENT ILLNESS:  A 65 year old white female with history of  end-stage DJD in right knee and chronic pain, presented to our office  for preop evaluation for total knee replacement.  She had progressively  worsening pain with failure to response with conservative treatment.  Significant decrease in her daily activities due to the ongoing  complaint.   HOSPITAL COURSE:  On January 31, 2009, the patient was taken to the [facility] OR and a right total knee replacement procedure performed.  Surgeon, Mckinley Jewel, MD and assistant Zonia Kief, PA-C.  Anesthesia  general.  No specimens.  EBL minimal.  Tourniquet time 90 minutes.  One  Hemovac drain used.  There were no surgical or anesthesia complications  and the patient was transferred to recovery in stable condition.  On  February 01, 2009, the patient doing well with good pain control.  Vital  signs stable, afebrile.  Hemoglobin 10.9, INR 1.2.  Slight bleeding  through dressing and this was reinforced.  Calf nontender and  neurovascularly intact.  Discontinued PCA.  Given a Xanax 0.5 mg p.o.  q.8 hours p.r.n. for anxiety.  Pharmacy protocol Coumadin started.  PT/OT consult.  On February 02, 2009, the patient feeling much better.  No  complaints of chest pain, shortness of breath.  Temperature 100.8, pulse  80, respirations 20, blood pressure 118/66.  Hemoglobin 10.7, INR 1.6.  Electrolytes  stable.  Wound looks good.  Staples intact.  No drainage or  signs of infection.  Hemovac drain discontinued.  Calf nontender and  neurovascularly intact.  Saline locked IV.  On February 03, 2009, the  patient doing well.  No complaints.  Vital signs stable, afebrile.  Hemoglobin 10.3, INR 1.7.  Wound looks good and staples intact.  No  drainage or signs of infection.  Calf nontender and neurovascularly  intact.  On February 04, 2009, the patient doing well and has made  excellent progress with therapy.  No complaints.  She is ready to  discharge home.  Vital signs stable, afebrile.  Hemoglobin 9.7,  hematocrit 28.9, INR 2.0.  Electrolyte stable.  Wound looks good and  staples intact.  No drainage or signs of infection.   DISPOSITION:  Discharge home.   CONDITION:  Good and stable.   MEDICATIONS:  1. Percocet 7.5/325 one to two tablets p.o. q.4-6 h. p.r.n. for pain.  2. Robaxin 500 mg one tablet p.o. q.6 h. p.r.n. for spasms.  3. Coumadin pharmacy protocol.  4. Resume previous home  meds.   INSTRUCTIONS:  The patient will work with home health PT and OT to  improve ambulation, knee range of motion, and strengthening.  Weight  bear as tolerated.  Daily dressing changes with 4 x 4 gauze and tape.  Coumadin x4 weeks for postop DVT prophylaxis.  Follow up when she is at  2 weeks postop for recheck.  Return sooner if needed.      Genene Churn. Denton Meek.      [name], M.D.  Electronically Signed    JMO/MEDQ  D:  03/07/2009  T:  03/07/2009  Job:  [id number]

## 2011-03-14 NOTE — Op Note (Signed)
[facility]  Patient:    Crystal Yoder, Crystal Yoder                         MRN: [id number] Proc. Date: 03/29/01 Attending:  [name], M.D.                           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  PROCEDURE:  Left knee examination under anesthesia followed by left total knee replacement.  Osteonics prosthesis.  Pressfit #7 cruciate retaining femoral component.  Cemented #9 tibial component with 15 mm polyethylene insert. Cemented recessed nonmetal backed 26 mm patellar component.  Appropriate soft tissue balancing with lateral retinacular release included.  SURGEON:  [name], M.D.  ASSISTANT:  Arlys John D. Petrarca, P.A.-C.  ANESTHESIA:  General anesthesia.  ESTIMATED BLOOD LOSS:  Minimal.  TOURNIQUET TIME:  1 hour 20 minutes.  SPECIMENS:  Bone and soft tissue.  CULTURES:  None.  COMPLICATIONS:  None.  DRAINS:  Hemovac x 2.  DESCRIPTION OF PROCEDURE:  The patient was brought to the operating room and placed on the operating table in the supine position.  After adequate anesthesia had been obtained, the left knee examined.  Alignment and varus correctible just about to neutral.  5 degree flexion contracture, further flexion a little bit better than 90 degrees.  Significant tibial femoral translation.  Tourniquet applied.  Prepped and draped in the usual sterile fashion.  Exsanguinated with elevation of Esmarch.  Tourniquet inflated to 400 mmHg.  Straight incision above the patella down to the tibial tubercle. Medial parapatellar arthrotomy.  Soft tissue capsular release.  Knee exposed. Grade IV changes medially, grade III in the other compartments. Unicompartmental replacement not an option.  Proceeded with total knee replacement.  Remnants of menisci,  anterior cruciate ligament, excessive synovial tissue, and adipose tissue excised.  Distal femur exposed. Intermedullary guide placed.  Distal cuts set at 5 degrees of valgus removing 10 mm.  Sized for #7 component.  Posterior cruciate ligament not contracted, therefore, retained.  Jigs put in place and definitive cuts made for the #7 cruciate retaining prosthesis.  Trial put in place and found to fit well. Trial removed.  Tibia exposed.  Tibial spine removed with the saw.  Sized for a #9 component.  The intermedullary guide place.  Proximal cut removing 4 to 6 mm off the deficient medial side with a 5 degree posterior slope cut.  Trials put in place.  With the 15 mm insert, I had full extension, full flexion, and no component liftoff and a good alignment set at 5 degrees of valgus after appropriate soft tissue release.  Tibia was marked for rotation and then hand reamed.  Patella sized, reamed, and drilled for the 26 mm component.  All trials were put in place.  Good alignment, good motion, and good stability. Full extension and full flexion without component liftoff.  Lateral release necessary to balance patellofemoral joint and this was performed from inside out with cautery.  All trials removed.  Copious irrigation with the pulse irrigating device.  Cement prepared.  Placed on tibial component and hammered in place.  Polyethylene attached.  Femoral component hammered in place. Cement applied to the patella component which was seated down and excessive cement removed.  Once the cement had  hardened, the knee was reexamined.  Good motion, good stability, and good alignment.  Hemovacs were placed and brought out through separate stab wounds.  Arthrotomy closed with #1 Vicryl in the skin and subcutaneous tissue with Vicryl and staples.  Margins of the wound injected with Marcaine as was the knee.  Sterile compressive dressing was applied.  Hemovac was clamped.  Knee immobilizer applied after  tourniquet removed.  Anesthesia reversed and brought to the recovery room.  Tolerated the surgery well with no complications. DD:  03/29/01 TD:  03/29/01 Job: [id number]

## 2011-03-14 NOTE — Assessment & Plan Note (Signed)
[facility]                            CARDIOLOGY OFFICE NOTE   NAME:Yoder, Crystal STANKOWSKI                       MRN:          [id number]  DATE:11/23/2006                            DOB:          1945-11-16    Crystal Yoder is a 65 year old woman with a history of nonischemic  cardiomyopathy. I last saw her in October.   In the interval, she has recovered from knee arthroscopic surgery. She  is also recovering from a URI.   Her breathing has been pretty good. She notes only trace edema.   She had an echocardiogram done in November. This showed an LVH reported  at 55%. On my review, it appeared a little bit lower at 45 to 50 but  better than in May.   CURRENT MEDICATIONS:  1. Coreg 3.125 b.i.d.  2. Atacand 4.  3. Multivitamin.  4. Fish oil.  5. Borage 1.3 g daily.  6. MSM 1 g daily.   PHYSICAL EXAMINATION:  The patient is in no distress.  HEENT:  Throat clear.  NECK:  JVP is normal. No bruits.  LUNGS:  Are clear. No wheezes or rales.  CARDIAC EXAM:  Regular rate and rhythm. S1 and S2. No S3. No murmurs.  ABDOMEN:  Obese, benign.  EXTREMITIES:  Trace edema.   Her blood pressure is 132/75, pulse 72, weight 249, relatively stable.   IMPRESSION:  1. Cardiomyopathy. Again, on medicine, her ejection fraction is a      little better. I told her to continue on this, watch her fluids. If      she gets a URI, she could liberalize a little bit on this. I would      like to see her back in July with an echocardiogram prior. I      encouraged her to exercise as she tolerates and will try to      increase possibly sedentary bike.  2. Health care maintenance. I will need to review what Borage is, I am      not familiar with this, and make sure there is no negative      interaction with this or the MSM, both for her arthritis. In      regards to lipids, last lipid panel was back in July, HDL of 63,      LDL of 127, needs to work on diet.   I will set to see  the patient back again in July, sooner if problems  develop.   Twelve-lead EKG:  Normal sinus rhythm, 60 beats per minute, LVH by  voltage and left-axis deviation.     Pricilla Riffle, MD, [facility]  Electronically Signed    PVR/MedQ  DD: 11/23/2006  DT: 11/24/2006  Job #: [id number]

## 2011-03-14 NOTE — Cardiovascular Report (Signed)
NAMEPAYSEN, GOZA                ACCOUNT NO.:  [id number]   MEDICAL RECORD NO.:  [id number]          PATIENT TYPE:  OIB   LOCATION:  1966                         FACILITY:  [facility]   PHYSICIAN:  Arvilla Meres, M.D. LHCDATE OF BIRTH:  [birth year]   DATE OF PROCEDURE:  DATE OF DISCHARGE:                              CARDIAC CATHETERIZATION   PRIMARY CARE PHYSICIAN/PULMONOLOGIST:  Clinton D. Maple Hudson, M.D.   CARDIOLOGIST:  Pricilla Riffle, M.D.   PATIENT IDENTIFICATION:  Crystal Yoder is a 65 year old morbidly obese woman  with a history of hypertension.  She has had a nonproductive cough for  several months and was being evaluated by Dr. Fannie Knee.  Echocardiogram  revealed significant LV dysfunction with an EF of about 35-45% and so she  was referred for cardiac catheterization to rule out ischemic etiology and  assess her filling pressures.  This was done the outpatient catheterization  lab.   PROCEDURES PERFORMED:  1.  Right heart cath.  2.  Left heart cath.  3.  Left ventriculogram.  4.  Selective coronary angiography.  5.  Angio-Seal closure.   DESCRIPTION OF PROCEDURE:  The risks and benefits of catheterization were  explained.  Consent was signed and placed on the chart.  A 4-French arterial  sheath was placed in right femoral artery using a modified Seldinger  technique.  Standard catheters including preformed Judkins JL-4, JR-4 angled  pigtail were used for the catheterization.  All catheter exchanges made over  a wire.  A 7-French venous sheath was placed in the right femoral vein using  a modified Seldinger technique.  A standard Swan-Ganz catheter was used for  the right heart catheterization.  There are no apparent complications.   Hemodynamics:   1.  Right atrial pressure mean of 8.  2.  RV pressure 55/6.  3.  PA pressure 54/24 with a mean of 37.  4.  Pulmonary capillary wedge pressure was a mean of 13.  5.  Aortic pressure was 155/98 with a mean of 117.  6.  LV  pressure was 162/8 with LVEDP of 19.  7.  Femoral arterial saturation was 91% on room air.  8.  PA saturations were 66%  and 68%.  9.  Fick cardiac output was 4.5 L/min.  10. Fick cardiac index was 2.1 L/min/m2.  11. Thermodilution cardiac output with 6.3 L/min.  12. Cardiac index was 2.9 L/min/m2.  13. Pulmonary vascular resistance was 4.9 Woods units.  There is no aortic      stenosis or mitral stenosis.    Angiography:   1.  Left main was short, angiographically normal.  2.  The LAD was a small vessel.  It gave off two small diagonals.  There was      no angiographic CAD.  3.  The Left circumflex was a very large system.  It gave off a large ramus      branch which fed much of the LAD territory.  There was a high OM-1 and      three small distal OMs.  There was no angiographic CAD.  4.  Right coronary artery was a very large dominant vessel.  It gave rise to      an RV branch, a large PDA, and a large branching posterolateral.  There      was no angiographic CAD.   Left ventricle was mildly dilated with a severe LV dysfunction.  EF of  approximately 20%.  No obvious mitral regurgitation.  There was global  hypokinesis.   ASSESSMENT:  1.  Normal coronary arteries.  2.  Severe left ventricle dysfunction.  Ejection fraction approximately 20%      with global hypokinesis, consistent with nonischemic cardiomyopathy.  3.  Moderate pulmonary arterial of hypertension.   PLAN/DISCUSSION:  Crystal Yoder has nonischemic cardiomyopathy of unclear  etiology.  I have told her that possibilities include viral cardiomyopathy,  alcoholic cardiomyopathy, or hypertensive cardiomyopathy, versus idiopathic.  At this point, she will need aggressive medical therapy with ACE inhibitor  and beta blocker.  She will follow up with Dr. Tenny Craw.  I have told her to  abstain from alcohol and we need to also focus on tight blood pressure  control.      Arvilla Meres, M.D. [facility]  Electronically  Signed     DB/MEDQ  D:  02/27/2006  T:  02/28/2006  Job:  [id number]

## 2011-03-14 NOTE — Op Note (Signed)
   NAME:  Crystal Yoder, Crystal Yoder                          ACCOUNT NO.:  [id number]   MEDICAL RECORD NO.:  [id number]                   PATIENT TYPE:  AMB   LOCATION:  DSC                                  FACILITY:  [facility]   PHYSICIAN:  Currie Paris, M.D.           DATE OF BIRTH:  Sep 29, 1946   DATE OF PROCEDURE:  09/06/2003  DATE OF DISCHARGE:                                 OPERATIVE REPORT   PREOPERATIVE DIAGNOSIS:  Polypoid lesion, anal polyp.   POSTOPERATIVE DIAGNOSIS:  Polypoid lesion, anal polyp.   PROCEDURE:  Excision of prolapsing anal polyp.   SURGEON:  Currie Paris, M.D.   ANESTHESIA:  Local.   INDICATIONS FOR PROCEDURE:  This patient was recently found to have a  polypoid area prolapsing out on the right posterior aspect of her rectum.  It appeared to be perhaps a little polypoid area on top of the hemorrhoid,  but clearly had a polypoid appearance.  After discussion with the patient,  we elected as an initial step to excise this with local.   DESCRIPTION OF PROCEDURE:  The patient was seen in the minor procedure room  and had no further questions.  The area was anesthetized with approximately  6 mL of a combination of 1% Xylocaine with epinephrine and 0.5% plain  Marcaine buffered with some sodium bicarbonate.  This gave excellent  anesthesia.  I waited about 10 minutes for some hemostatic effect.  The  polyp was then, because of the local effect, somewhat more prolapsed out and  I was able to put a hemostat under it and I cut it off with a knife.  I then  used 3-0 chromic to undersew under the hemostat and then running locking  going back over that for hemostatic closure.  The prolapsing mucosa easily  reduced back inside and I put a little Gelfoam soaked with some local as  well.   The patient tolerated the procedure well.  She has been given postoperative  instructions.  She is to begin [employer], take Vicodin for pain,  and some ProctoFoam HC  cream.  Follow up in the office in a couple of weeks  or sooner if there are any problems.                                               Currie Paris, M.D.    CJS/MEDQ  D:  09/06/2003  T:  09/07/2003  Job:  [id number]

## 2011-03-14 NOTE — Assessment & Plan Note (Signed)
[facility]                              CARDIOLOGY OFFICE NOTE   NAME:Yoder, Crystal HAWN                       MRN:          [id number]  DATE:07/27/2006                            DOB:          May 14, 1946    IDENTIFICATION:  Ms. Crystal Yoder is a 65 year old woman with a history of non-  ischemic cardiomyopathy.  I last saw her back in July.  In the interval, she  has started school, she has had upper respiratory infections.  She notes  recently a little bit of dizziness with standing.  She denies PND, no edema.   CURRENT MEDICATIONS:  Coreg 3.125 mg b.i.d., Atacand 4 mg daily.   PHYSICAL EXAMINATION:  GENERAL:  The patient is in no acute distress.  She  has some nasal congestion with talking.  VITAL SIGNS:  Blood pressure 98/80, pulse 66, weight 251.  NECK:  JVP is normal.  LUNGS:  Clear.  CARDIAC:  Regular rate and rhythm, S1 and S2, no S3, no significant murmurs.  ABDOMEN:  Benign.  EXTREMITIES:  Trace edema of the right foot.   IMPRESSION:  1. Non-ischemic cardiomyopathy, again, this was tested back in May.  I      would continue her on her current regimen.  Blood pressure is a little      low today, probably with her upper respiratory infection, she could cut      the Atacand back when she is feeling dizzy to 2 mg, especially when she      is sick, continue on the Coreg, increase when she is feeling better.  I      would get an echocardiogram at some point to re-evaluate her LV      function, again she is concerned more, I do not think it would change      therapy, she is concerned of worsening.  She is due to have knee      surgery and I think, overall, she should tolerate this.  Again,      vigilance with IV fluids, but otherwise, she should tolerate the      procedure well without major complications.  I do not think further      testing is indicated and, indeed, she could proceed without the      echocardiogram.  2. Health care maintenance.   Will check with Dr. Lucilla Lame office regarding      lipid panel which may have been done recently.  3. History of sleep apnea.  The patient usually uses CPAP, has not because      of the URI, will resume when she is feeling better.   I would like to see the patient back at the end of January.  She is due to  have the surgery in December.  We will be available as needed.            ______________________________  Pricilla Riffle, MD, [facility]     PVR/MedQ  DD:  07/27/2006  DT:  07/28/2006  Job #:  [id number]   cc:   [name], M.D.  [place]acie [place] Cliffton Asters, M.D.

## 2011-03-14 NOTE — Assessment & Plan Note (Signed)
[name]                              CARDIOLOGY OFFICE NOTE   NAME:Crystal Yoder, Crystal Yoder                       MRN:          [id number]  DATE:05/21/2006                            DOB:          09-23-1946    IDENTIFICATION:  Crystal Yoder is a 65 year old woman with a history of a  nonischemic cardiomyopathy.  She was last seen at the end of June.   When I saw her last, I increased her Atacand to 8 mg daily.  A BMET, though,  was done and her potassium was 5.8 and was discontinued.  Since then she has  been doing okay.  She has been back and forth to the beach.  She says she  thinks she feeling better.  She is no longer bothered by a cough.   She was seen by Crystal Farber, MD, who did not think she had a vascular  problem for her feet and referred her to podiatry.  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   CURRENT MEDICATIONS:  Coreg 3.125 twice daily.  Atacand was discontinued.   PHYSICAL EXAMINATION:  GENERAL:  The patient is in no acute distress.  VITAL SIGNS:  Blood pressure 130/80, pulse 72, weight 252.  LUNGS:  Are clear.  NECK:  JVP is normal.  CARDIAC:  Regular rate and rhythm, S1, S2, no S3, no murmurs.  ABDOMEN:  Benign.  EXTREMITIES:  No edema.  Right foot with deformity and no venous  insufficiency bilaterally.   IMPRESSION:  1.  Cardiomyopathy.  Again, recent catheterization back in May.  Wedge      pressure at the time was 13.  PA pressure 55/24, mean 37, coronary      arteries were normal.  Left ventricular function was estimated at 20%      though echocardiogram LVF on May 24, LVF was estimated at 35% which I      think is a better estimate.   Clinically, she is doing well.  I would like to get her back on a trial of  Atacand again 4 mg.  We will check a BMET in one week.   I will set to see the patient back in a few months, sooner if problems  develop.  Again, encouraged  her to stay active.  She is going to cardiac  rehabilitation and seems to be getting improvement with this.  Soon to start  school.                               Crystal Riffle, MD, [facility]    PVR/MedQ  DD:  05/22/2006  DT:  05/22/2006  Job #:  [id number]

## 2011-03-14 NOTE — Procedures (Signed)
[place]  Patient:    Crystal Yoder, Crystal Yoder                         MRN: [id number] Proc. Date: 03/29/01 Adm. Date:  [id number] Attending:  Colbert Ewing CC:         Anesthesia Department   Procedure Report  PREOPERATIVE DIAGNOSIS:  Degenerative joint disease of the knee.  PROCEDURE:  Left total knee replacement performed by [name], M.D.  ANESTHESIA PROCEDURE:  Placement of lumbar epidural catheter for postoperative analgesia.  DESCRIPTION OF PROCEDURE:  Preoperatively we were consulted for placement of epidural catheter with postoperative analgesia in this woman, who was scheduled to undergo total knee replacement.  I discussed in detail the epidural placement for postoperative analgesia, including alternatives for her pain control.  Initially this had been requested by Dr. Eulah Pont for postoperative analgesia.  The patient consented to placement of the epidural catheter for her postoperative analgesia.  At the end of the operative procedure, the patient was turned to the left lateral decubitus position and sterile prep of the lumbar area was conducted. Using a #17-gauge Tuohy needle adjacent to the L2-3 interspace, the epidural space was contacted with a loss of resistance technique.  The catheter threaded easily approximately 3-4 cm beyond the needle tip, and the needle was removed.  After negative aspiration for both heme and CSF, the catheter was injected with a total of 7 cc of 0.25% Marcaine containing 100 mcg of fentanyl.  This was secured in place with tape.  The patient turned supine, extubated, and transferred to PACU in stable condition.  DISPOSITION:  The patient will be followed daily by the department of anesthesiology for her postoperative analgesia with the epidural catheter. DD:  03/29/01 TD:  03/29/01 Job: [id number]

## 2011-03-14 NOTE — Letter (Signed)
May 07, 2006     Fannie Knee, M.D.  [address]  [place], [place]  [address]   RE:  ZEHRA, RUCCI  MRN:  [id number]  /  DOB:  01/16/1946   Dear Levonne Spiller,   Thank you for referring Ms. Poer for consultation regarding her venous  varicosities.  As you know, Ms. Wooldridge is a 66 year old lady with recently  diagnosed nonischemic cardiomyopathy as well as longstanding obesity.  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  It sounds as if she never had really more than  trace edema, however.   In May she noted a hard mass on the dorsal aspect of her right foot.  She  cannot recall any trauma and says it is nonpainful.  She has not had any  evaluation of this.   Her past medical history is remarkable for nonischemic cardiomyopathy with  ejection fraction of 35%.  She has obstructive sleep apnea for which she has  previously been noncompliance with her CPAP but has  recently started using  it.  She had a knee replacement in 2001 and had a splenectomy in 1966.   SHE IS ALLERGIC TO PENICILLIN WHICH CAUSES HIVES AND ALSO TO SULFA.   Her current medications are Carvedilol 3.125 mg twice per day, Atacand 8 mg  per day and glucosamine.   She works as a Midwife.  She enjoys reading.  She is married  with one grown child.  She denies alcohol use.  She quit smoking in 1981.  Drinks two to three caffeinated beverages per day.   Her father died at 59 of prostate cancer, mother died at 80 of failure to  thrive.  She has no siblings.   Review of systems is remarkable for wearing glasses.  She has mild  exertional dyspnea.  She follows a low salt diet.  She has some discomfort  in her knees at times.  She has allergic rhinitis.  Review of systems is  otherwise negative in detail except as above.   On physical examination she is an  obese woman in no distress.  Heart rate is  70, blood pressure 134/82 and equal bilaterally.  She is 5 feet 9 inches  tall and weighs 256 pounds.  HEENT is normal.  Skin exam is normal with the  exception of moderate superficial varicosities below the knee bilaterally.  She has no jugular venous distention, no thyromegaly and no lymphadenopathy.  Respiratory effort is normal.  Lungs are clear to auscultation.  She has a  nonpalpable point of maximal cardiac impulse.  There is a regular rate and  rhythm without murmur, rub or gallop. The abdomen is soft, nondistended, and  nontender.  Due to habitus, I am unable to assess hepatosplenomegaly and  unable to exclude a mass, however, none is evident.  The bowel sounds are  normal and there is no evident abdominal bruit.  Carotid pulses are 2+  bilaterally without bruit.  DP pulses are 2+ bilaterally.  Extremities are  warm without clubbing, cyanosis, edema or ulcerations.  There is a very firm  mass on the dorsal aspect of her left  foot.  It is completely nontender.  There is no overlying skin discoloration.  The right foot is distinctly  different than the left foot.  She is alert  and oriented x3 with normal  neurologic exam and normal affect.   I note that you obtained a venous duplex study in December 2006 that showed  no evidence of deep or superficial venous thrombus and no evidence of venous  incompetence on either side.   In summary, Ms. 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  She does not  have significant edema even with standing all day in her work as a  Midwife.  I expressed to her the benign nature of these  varicosities.  I have explained that they do not significantly increase the  risk of venous thrombosis.  I have advised her they could be treated  percutaneously for cosmetic benefit, however, given the possibility of the   development of coronary disease, I advised her against stripping of her  saphenous vein, particularly in the absence of any incompetence.   I took the liberty of referring her to Dr. Tinnie Gens Petrinitz for assessment  of the mass on her foot.   Thank you for the opportunity to participate in the care of this nice lady.    Sincerely,      [name], MD   WED/MedQ  DD:  05/07/2006  DT:  05/07/2006  Job #:  [id number]   CC:    Pricilla Riffle, MD, [facility]

## 2011-03-27 ENCOUNTER — Telehealth: Payer: Self-pay | Admitting: Internal Medicine

## 2011-03-27 ENCOUNTER — Telehealth: Payer: Self-pay | Admitting: *Deleted

## 2011-03-27 DIAGNOSIS — E782 Mixed hyperlipidemia: Secondary | ICD-10-CM

## 2011-03-27 DIAGNOSIS — R5383 Other fatigue: Secondary | ICD-10-CM

## 2011-03-27 NOTE — Telephone Encounter (Signed)
Patient will come to [place] office for lab work on 04/07/2011

## 2011-03-27 NOTE — Telephone Encounter (Signed)
Called patient back. She would like a Lipid panel and TSH lab work done prior to her Dr.Ross visit. Will order for the [facility] lab.

## 2011-03-27 NOTE — Telephone Encounter (Signed)
Pt wants to have blood work done before her dr appt with dr Tenny Craw. Pt also wants to have a thyroid test add to blood work. Pt # [id number] cell# [id number]

## 2011-03-31 ENCOUNTER — Encounter: Payer: Self-pay | Admitting: Internal Medicine

## 2011-04-04 ENCOUNTER — Other Ambulatory Visit (INDEPENDENT_AMBULATORY_CARE_PROVIDER_SITE_OTHER): Payer: Self-pay

## 2011-04-04 DIAGNOSIS — E785 Hyperlipidemia, unspecified: Secondary | ICD-10-CM

## 2011-04-04 DIAGNOSIS — I428 Other cardiomyopathies: Secondary | ICD-10-CM

## 2011-04-04 LAB — LIPID PANEL
Cholesterol: 197 mg/dL (ref 0–200)
HDL: 59.3 mg/dL (ref >39.00)
Triglycerides: 65 mg/dL (ref 0.0–149.0)
VLDL: 13 mg/dL (ref 0.0–40.0)

## 2011-04-04 LAB — TSH: TSH: 1.8 u[IU]/mL (ref 0.35–5.50)

## 2011-04-14 ENCOUNTER — Encounter: Payer: Self-pay | Admitting: Internal Medicine

## 2011-04-14 ENCOUNTER — Ambulatory Visit (INDEPENDENT_AMBULATORY_CARE_PROVIDER_SITE_OTHER): Payer: Medicare Other | Admitting: Internal Medicine

## 2011-04-14 VITALS — BP 120/88 | HR 64 | Ht 69.0 in | Wt 267.0 lb

## 2011-04-14 DIAGNOSIS — I10 Essential (primary) hypertension: Secondary | ICD-10-CM

## 2011-04-14 DIAGNOSIS — I428 Other cardiomyopathies: Secondary | ICD-10-CM

## 2011-04-14 DIAGNOSIS — G4733 Obstructive sleep apnea (adult) (pediatric): Secondary | ICD-10-CM

## 2011-04-14 DIAGNOSIS — E785 Hyperlipidemia, unspecified: Secondary | ICD-10-CM

## 2011-04-14 NOTE — Progress Notes (Addendum)
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 I last saw her in the spring. Since seen she had an echo which showed the LVEF was 45 to 50%.  She was last in clinic in December. Since seen she has had recurrent problems with incontinence.  She is back to using a pesary She is currently suffering from a sinus infection.  Has appt with Clint Young in the next week When not ill, her breathing has been ok.  No chest pains.She notes only occasional LE edema. She had lipids drawn earlier this spring.  LDL was in the 120s.  She is interested in tryng [employer].  Allergies  Allergen Reactions  . Penicillins   . Sulfonamide Derivatives     Current Outpatient Prescriptions  Medication Sig Dispense Refill  . carvedilol (COREG) 3.125 MG tablet Take 3.125 mg by mouth 2 (two) times daily with a meal.        . dextromethorphan-guaiFENesin (MUCINEX DM) 30-600 MG per 12 hr tablet Take 1 tablet by mouth 2 (two) times daily as needed.       . fluticasone (VERAMYST) 27.5 MCG/SPRAY nasal spray Place 2 sprays into the nose 2 (two) times daily as needed.       Marland Kitchen HYDROcodone-homatropine (HYCODAN) 5-1.5 MG/5ML syrup Take 5 mLs by mouth every 6 (six) hours as needed.        . loratadine (CLARITIN) 10 MG tablet Take 10 mg by mouth daily as needed.       Marland Kitchen losartan (COZAAR) 50 MG tablet Take 50 mg by mouth daily.        . pantoprazole (PROTONIX) 40 MG tablet Take 40 mg by mouth as needed.       . doxycycline (VIBRAMYCIN) 100 MG capsule 2 today, then one daily       . DISCONTD: promethazine-codeine (PHENERGAN WITH CODEINE) 6.25-10 MG/5ML syrup Take 5 mLs by mouth every 6 (six) hours as needed.          Past Medical History  Diagnosis Date  . Other primary cardiomyopathies   . Esophageal reflux   . Unspecified venous (peripheral) insufficiency   . Obesity, unspecified   . Arthropathy, unspecified, site  unspecified   . Other acute sinusitis   . Obstructive sleep apnea (adult) (pediatric)   . Arthritis     Past Surgical History  Procedure Date  . Replacement total knee   . Bladder surgery   . Splenectomy   . Pelvic floor repair     Family History  Problem Relation Age of Onset  . Prostate cancer Father     History   Social History  . Marital Status: Married    Spouse Name: N/A    Number of Children: N/A  . Years of Education: N/A   Occupational History  . School teacher    Social History Main Topics  . Smoking status: Former Smoker -- 0.5 packs/day for 10 years    Types: Cigarettes    Quit date: 10/27/1981  . Smokeless tobacco: Not on file  . Alcohol Use: Yes     Glass of wine per day  . Drug Use: Not on file  . Sexually Active: Not on file   Other Topics Concern  . Not on file   Social History Narrative  . No narrative on file    Review of Systems:  All systems reviewed.  They are negative to the above  problem except as previously stated.  Vital Signs: Pulse 64  Ht 5\' 9"  (1.753 m)  Wt 267 lb (121.11 kg)  BMI 39.43 kg/m2  Physical Exam Patient is in NAD  HEENT:  Normocephalic, atraumatic. EOMI, PERRLA.  Neck: JVP is normal. No thyromegaly. No bruits.  Lungs: clear to auscultation. No rales no wheezes.  Heart: Regular rate and rhythm. Normal S1, S2. No S3.   No significant murmurs. PMI not displaced.  Abdomen:  Supple, nontender. Normal bowel sounds. No masses. No hepatomegaly.  Extremities:   Good distal pulses throughout. No lower extremity edema.  Musculoskeletal :moving all extremities.  Neuro:   alert and oriented x3.  CN II-XII grossly intact.  EKG:  NSR.  Nonspecific IVCD.  Assessment and Plan:

## 2011-04-14 NOTE — Patient Instructions (Signed)
Your physician wants you to follow-up in: January 2013 with Dr.Ross You will receive a reminder letter in the mail two months in advance. If you don't receive a letter, please call our office to schedule the follow-up appointment.  

## 2011-04-15 ENCOUNTER — Ambulatory Visit
Admission: RE | Admit: 2011-04-15 | Discharge: 2011-04-15 | Disposition: A | Discharge status: Home / Self Care | Payer: Medicare Other | Source: Ambulatory Visit | Attending: Family Medicine | Admitting: Family Medicine

## 2011-04-15 DIAGNOSIS — Z1231 Encounter for screening mammogram for malignant neoplasm of breast: Secondary | ICD-10-CM

## 2011-04-15 NOTE — Assessment & Plan Note (Signed)
Has tolerated until now with URI.

## 2011-04-15 NOTE — Assessment & Plan Note (Signed)
Patient with mild LV dysfunction by echo. Volume looks good on exam.  I would continue meds.

## 2011-04-15 NOTE — Assessment & Plan Note (Signed)
I would recommend aggressive control of lipids given her cardiac history.  She would like to try diet.  Also, has a lot of other things going on now. Will follow closely.  Cath in 2007 without evdence of CAD.

## 2011-05-09 ENCOUNTER — Telehealth: Payer: Self-pay | Admitting: Internal Medicine

## 2011-05-09 NOTE — Telephone Encounter (Signed)
Called patient and advised her that she had a lipid panel and a tsh level. She would like results sent to Dr.Cynthia White. Will forward results.

## 2011-05-09 NOTE — Telephone Encounter (Signed)
Pt had lab work last week and wants to know exactly what we did, needs to see another dr and will have to have more done and doesn't want to have to repeat anything that was already done

## 2011-05-13 ENCOUNTER — Ambulatory Visit (INDEPENDENT_AMBULATORY_CARE_PROVIDER_SITE_OTHER): Payer: Medicare Other | Admitting: Internal Medicine

## 2011-05-13 ENCOUNTER — Encounter: Payer: Self-pay | Admitting: Internal Medicine

## 2011-05-13 ENCOUNTER — Ambulatory Visit (INDEPENDENT_AMBULATORY_CARE_PROVIDER_SITE_OTHER)
Admission: RE | Admit: 2011-05-13 | Discharge: 2011-05-13 | Disposition: A | Discharge status: Home / Self Care | Payer: Medicare Other | Source: Ambulatory Visit | Attending: Internal Medicine | Admitting: Internal Medicine

## 2011-05-13 VITALS — BP 94/72 | HR 90 | Ht 69.0 in | Wt 269.4 lb

## 2011-05-13 DIAGNOSIS — J40 Bronchitis, not specified as acute or chronic: Secondary | ICD-10-CM

## 2011-05-13 DIAGNOSIS — K219 Gastro-esophageal reflux disease without esophagitis: Secondary | ICD-10-CM

## 2011-05-13 DIAGNOSIS — R05 Cough: Secondary | ICD-10-CM

## 2011-05-13 NOTE — Progress Notes (Signed)
Subjective:    Patient ID: Crystal Yoder, female    DOB: 30-Apr-1946, 65 y.o.   MRN: [id number]  HPI 05/13/11- 64 yoF followed for OSA, allergic rhinitis, bronchitis, complicated by hx of GERD, peripheral venous insufficiency, OHS.             Husband here. Last here December 10, 2010- note reviewed C/o cough, saying it undid her pelvic floor repair. Cough is better than with a cold last November. Still daily hacking cough, worse maybe as she lies down. Coughs more in the morning, sometimes with scant mucus. Has pet birds but just went to beach for 2 weeks, with no difference while away. Denies significant postnasal drip. Has noted some reflux , not choking with meals and not waking choked or strangled. Tries to eat earlier before bedtime.  Not using CPAP/BIPAP now at all. Still aspires to weight loss. Review of Systems Constitutional:   No-   weight loss, night sweats, fevers, chills, fatigue, lassitude. HEENT:   No-   headaches, difficulty swallowing, tooth/dental problems, sore throat,                  No-   sneezing, itching, ear ache, nasal congestion, post nasal drip,   CV:  No-   chest pain, orthopnea, PND, swelling in lower extremities, anasarca, dizziness, palpitations  GI:  No-   heartburn, indigestion, abdominal pain, nausea, vomiting, diarrhea,                 change in bowel habits, loss of appetite  Resp: No-   shortness of breath with exertion or at rest.  No-  excess mucus,             No-  coughing up of blood.              No-   change in color of mucus.  No- wheezing.    Skin: No-   rash or lesions.  GU: No-   dysuria, change in color of urine, no urgency or frequency.  No- flank pain.  MS:  No-   joint pain or swelling.  No- decreased range of motion.  No- back pain.  Psych:  No- change in mood or affect. No depression or anxiety.  No memory loss.      Objective:   Physical Exam General- Alert, Oriented, Affect-appropriate, Distress- none acute     Obese, very  heavy legs Skin- rash-none, lesions- none, excoriation- none Lymphadenopathy- none Head- atraumatic            Eyes- Gross vision intact, PERRLA, conjunctivae clear secretions            Ears- Hearing, canals            Nose- Clear, No-Septal dev, mucus, polyps, erosion, perforation             Throat- Mallampati III-IV , mucosa clear , drainage- none, tonsils- atrophic. Frequent throat clearing. Neck- flexible , trachea midline, no stridor , thyroid nl, carotid no bruit Chest - symmetrical excursion , unlabored           Heart/CV- RRR , no murmur , no gallop  , no rub, nl s1 s2                           - JVD- none , edema- none, stasis changes- none, varices- none           Lung- bibasilar crackles, wheeze-  none, cough- intermittent , dullness-none, rub- none           Chest wall-  Abd- tender-no, distended-no, bowel sounds-present, HSM- no Br/ Gen/ Rectal- Not done, not indicated Extrem- cyanosis- none, clubbing, none, atrophy- none, strength- nl.                 Superficial varices. Neuro- grossly intact to observation         Assessment & Plan:

## 2011-05-13 NOTE — Assessment & Plan Note (Addendum)
Active cough with bronchitis component, possible reflux. I am concerned about basilar crackles.  She wants to try more constant use of an acid blocker to see what that does. Suspect it will be a cyclical multifactorial cough.

## 2011-05-13 NOTE — Patient Instructions (Addendum)
Order- Barium swallow - dx cough  Order- CXR dx cough  Try using your acid blocker every day, before a meal.

## 2011-05-14 ENCOUNTER — Telehealth: Payer: Self-pay | Admitting: Internal Medicine

## 2011-05-14 NOTE — Telephone Encounter (Signed)
Called, spoke with pt.  She is requesting refill on hycodan cough syrup.  She was last seen on 05/13/11.  She is aware CDY is out of office this evening and requesting pick up rx tomorrow after barium swallow which is scheduled for 10:30 at [facility]. CDY, pls advise if rx is ok -- thanks!

## 2011-05-15 ENCOUNTER — Ambulatory Visit (HOSPITAL_COMMUNITY)
Admission: RE | Admit: 2011-05-15 | Discharge: 2011-05-15 | Disposition: A | Discharge status: Home / Self Care | Payer: Medicare Other | Source: Ambulatory Visit | Attending: Internal Medicine | Admitting: Internal Medicine

## 2011-05-15 DIAGNOSIS — K219 Gastro-esophageal reflux disease without esophagitis: Secondary | ICD-10-CM

## 2011-05-15 DIAGNOSIS — K224 Dyskinesia of esophagus: Secondary | ICD-10-CM | POA: Insufficient documentation

## 2011-05-15 DIAGNOSIS — R05 Cough: Secondary | ICD-10-CM | POA: Insufficient documentation

## 2011-05-15 DIAGNOSIS — R059 Cough, unspecified: Secondary | ICD-10-CM | POA: Insufficient documentation

## 2011-05-15 MED ORDER — HYDROCODONE-HOMATROPINE 5-1.5 MG/5ML PO SYRP: 5.0000 mL | ORAL_SOLUTION | Freq: Four times a day (QID) | ORAL | Status: DC | PRN

## 2011-05-15 NOTE — Assessment & Plan Note (Signed)
Probable cyclical cough with bronchitis and reflux components

## 2011-05-15 NOTE — Telephone Encounter (Signed)
Pt husband aware. Jennifer Castillo, CMA  

## 2011-05-15 NOTE — Telephone Encounter (Signed)
Per CY-okay to print Rx for Hydrocodone Cough Syrup #234ml 1 tsp every 6 hours prn cough with 1 refill. Will leave at the front for pick up.

## 2011-05-20 ENCOUNTER — Other Ambulatory Visit: Payer: Self-pay | Admitting: Internal Medicine

## 2011-05-20 DIAGNOSIS — R911 Solitary pulmonary nodule: Secondary | ICD-10-CM

## 2011-05-20 NOTE — Progress Notes (Signed)
Quick Note:  Pt aware of results and understands that CT chest with contrast order has been placed. Pt will be called with date and time of appt for CT chest with contrast by our PCCs. ______

## 2011-05-20 NOTE — Progress Notes (Signed)
Quick Note:  Pt aware of results. ______ 

## 2011-05-21 ENCOUNTER — Other Ambulatory Visit: Payer: Self-pay | Admitting: Internal Medicine

## 2011-05-21 ENCOUNTER — Other Ambulatory Visit (INDEPENDENT_AMBULATORY_CARE_PROVIDER_SITE_OTHER): Payer: Medicare Other

## 2011-05-21 DIAGNOSIS — J984 Other disorders of lung: Secondary | ICD-10-CM

## 2011-05-21 DIAGNOSIS — R911 Solitary pulmonary nodule: Secondary | ICD-10-CM

## 2011-05-21 LAB — BASIC METABOLIC PANEL
BUN: 18 mg/dL (ref 6–23)
Calcium: 8.8 mg/dL (ref 8.4–10.5)
Creatinine, Ser: 0.9 mg/dL (ref 0.4–1.2)

## 2011-05-23 ENCOUNTER — Telehealth: Payer: Self-pay | Admitting: Internal Medicine

## 2011-05-23 ENCOUNTER — Ambulatory Visit (INDEPENDENT_AMBULATORY_CARE_PROVIDER_SITE_OTHER)
Admission: RE | Admit: 2011-05-23 | Discharge: 2011-05-23 | Disposition: A | Discharge status: Home / Self Care | Payer: Medicare Other | Source: Ambulatory Visit | Attending: Internal Medicine | Admitting: Internal Medicine

## 2011-05-23 DIAGNOSIS — J984 Other disorders of lung: Secondary | ICD-10-CM

## 2011-05-23 DIAGNOSIS — R911 Solitary pulmonary nodule: Secondary | ICD-10-CM

## 2011-05-23 MED ORDER — IOHEXOL 300 MG/ML  SOLN
80.0000 mL | Freq: Once | INTRAMUSCULAR | Status: AC | PRN
  Administered 2011-05-23: 80 mL via INTRAVENOUS

## 2011-05-23 NOTE — Telephone Encounter (Signed)
CT chest 7/24 looks benign and stable. Very mild emphysema changes. Tiny spots in right mid lung and spleen are unchanged since 2006 and therefore benign with no concern.

## 2011-05-23 NOTE — Telephone Encounter (Signed)
Spoke with patient regarding CT results.

## 2011-05-23 NOTE — Telephone Encounter (Signed)
Spoke with patient- would like to have CT scan results TODAY; Please call patient results. Thanks.

## 2011-05-29 ENCOUNTER — Other Ambulatory Visit: Payer: Self-pay | Admitting: Internal Medicine

## 2011-06-13 ENCOUNTER — Telehealth: Payer: Self-pay | Admitting: Internal Medicine

## 2011-06-13 ENCOUNTER — Ambulatory Visit (INDEPENDENT_AMBULATORY_CARE_PROVIDER_SITE_OTHER): Payer: Medicare Other | Admitting: Internal Medicine

## 2011-06-13 ENCOUNTER — Encounter: Payer: Self-pay | Admitting: Internal Medicine

## 2011-06-13 VITALS — BP 116/64 | HR 86

## 2011-06-13 DIAGNOSIS — J40 Bronchitis, not specified as acute or chronic: Secondary | ICD-10-CM

## 2011-06-13 DIAGNOSIS — J309 Allergic rhinitis, unspecified: Secondary | ICD-10-CM

## 2011-06-13 DIAGNOSIS — G4733 Obstructive sleep apnea (adult) (pediatric): Secondary | ICD-10-CM

## 2011-06-13 MED ORDER — DOXYCYCLINE HYCLATE 100 MG PO TABS: ORAL_TABLET | ORAL | Status: DC

## 2011-06-13 NOTE — Telephone Encounter (Signed)
SPoke with pt and is aware doxycyline was sent to the pharmacy. Pt verbalized understanding of the directions and it was sent to pharmacy

## 2011-06-13 NOTE — Assessment & Plan Note (Addendum)
She tends to cough if she lies down or shortly thereafter. I still suspect a component of reflux contributing to this cough. I have educated her on antireflux measures. Acute viral cold now.  We will look at a1AT level

## 2011-06-13 NOTE — Assessment & Plan Note (Addendum)
Continue to encourage return to use of CPAP, weight loss She expresses desire to "feel better" and has gone to a chiropractor for his blood tests and management. I would expect more benefit to her from weight loss and improved sleep quality but she has not followed this advice.

## 2011-06-13 NOTE — Telephone Encounter (Signed)
Per CY-suggest doxycycline 100 mg #8 take 2 today then daily until gone. No refills.

## 2011-06-13 NOTE — Telephone Encounter (Signed)
Called, spoke with pt.  States she is "so out of it" that she forgot to ask for an abx today during OV.  C/o being sleepy, cough - prod with yellowish mucus, and clamy at times.  Symptoms have been going on all week.  Allergies verified.  [facility] [address]  Dr. Maple Hudson, pls advise.  Thanks   Allergies  Allergen Reactions  . Penicillins   . Sulfonamide Derivatives

## 2011-06-13 NOTE — Progress Notes (Signed)
Subjective:    Patient ID: Crystal Yoder, female    DOB: November 06, 1945, 65 y.o.   MRN: [id number]  HPI    Review of Systems     Objective:   Physical Exam        Assessment & Plan:   Subjective:    Patient ID: Crystal Yoder, female    DOB: 1946-02-10, 65 y.o.   MRN: [id number]  HPI 05/13/11- 64 yoF followed for OSA, allergic rhinitis, bronchitis, complicated by hx of GERD, peripheral venous insufficiency, OHS.             Husband here. Last here December 10, 2010- note reviewed C/o cough, saying it undid her pelvic floor repair. Cough is better than with a cold last November. Still daily hacking cough, worse maybe as she lies down. Coughs more in the morning, sometimes with scant mucus. Has pet birds but just went to beach for 2 weeks, with no difference while away. Denies significant postnasal drip. Has noted some reflux , not choking with meals and not waking choked or strangled. Tries to eat earlier before bedtime.  Not using CPAP/BIPAP now at all. Still aspires to weight loss.  06/13/11- 6 yoF former smoker followed for OSA, allergic rhinitis, COPD, complicated by hx of GERD, peripheral venous insufficiency, OHS.  Husband here.  Was going to try  Her CPAP again, but has a cold this week. This doesn't explain why she hasn't tried any of the other weeks since she was here last. Going to chiropracter Dr Margaretha Sheffield- brings labs and shows me vitamin D and a complex cream she is using now. He diagnosed her with a variety of deficiencies, insufficiencies and inflammations based on his broad blood panel. CT chest/w cm 05/23/11- no neoplasm or other active disease. Stable mild pulmonary emphysema. We looked at these images together, pointing out the emphysema. I explained this is part of a COPD pattern remaining from her years of cigarette smoking. Found mold on window sills where blinds stayed down. Discussed changing mattress, encasings, aircleaners  Check for a1AT Coughs as she lies down.   Does not recognize either postnasal drip or reflux.  She emphasizes that she "just wants to feel better"  Review of Systems Constitutional:   No-   weight loss, night sweats, fevers, chills,                 + fatigue, lassitude. HEENT:   No-   headaches, difficulty swallowing, tooth/dental problems, sore throat,                  Little-   sneezing, itching, ear ache, nasal congestion, post nasal drip,  CV:  No-   chest pain, orthopnea, PND, swelling in lower extremities, anasarca, dizziness, palpitations GI:  No-   heartburn, indigestion, abdominal pain, nausea, vomiting, diarrhea,                 change in bowel habits, loss of appetite Resp: No-acute  shortness of breath with exertion or at rest.  No-  excess mucus,             No-  coughing up of blood.              No-   change in color of mucus.  No- wheezing.   Skin: No-   rash or lesions. GU: No-   dysuria, change in color of urine, no urgency or frequency.  No- flank pain. MS:  No-acute  joint pain or swelling.  No- decreased range of motion.  No- back pain. Psych:  No- change in mood or affect. No depression or anxiety.  No memory loss.      Objective:   Physical Exam General- Alert, Oriented, Affect-appropriate, Distress- none acute     Obese, nasal and hoarse Skin- rash-none, lesions- none, excoriation- none Lymphadenopathy- none Head- atraumatic            Eyes- Gross vision intact, PERRLA, conjunctivae clear secretions            Ears- Hearing, canals normal            Nose- Clear, No-Septal dev, mucus, polyps, erosion, perforation             Throat- Mallampati III-IV , mucosa clear , drainage- none, tonsils- atrophic.  Neck- flexible , trachea midline, no stridor , thyroid nl, carotid no bruit Chest - symmetrical excursion , unlabored           Heart/CV- RRR , no murmur , no gallop  , no rub, nl s1 s2                           - JVD- none , edema- none, stasis changes- none, varices- none           Lung- bibasilar  crackles, wheeze- none, cough- intermittent , dullness-none, rub- none           Chest wall-  Abd- tender-no, distended-no, bowel sounds-present, HSM- no Br/ Gen/ Rectal- Not done, not indicated Extrem- cyanosis- none, clubbing, none, atrophy- none, strength- nl.                 Superficial varices. Neuro- grossly intact to observation         Assessment & Plan:

## 2011-06-13 NOTE — Patient Instructions (Signed)
Screen for a1AT

## 2011-06-13 NOTE — Assessment & Plan Note (Signed)
We again discussed dust control. They had questions about allergy evaluation.

## 2011-07-22 ENCOUNTER — Encounter: Payer: Self-pay | Admitting: Internal Medicine

## 2011-07-29 ENCOUNTER — Ambulatory Visit: Payer: Medicare Other | Admitting: Internal Medicine

## 2011-08-05 ENCOUNTER — Ambulatory Visit (INDEPENDENT_AMBULATORY_CARE_PROVIDER_SITE_OTHER): Payer: Medicare Other | Admitting: Internal Medicine

## 2011-08-05 ENCOUNTER — Encounter: Payer: Self-pay | Admitting: Internal Medicine

## 2011-08-05 ENCOUNTER — Ambulatory Visit: Payer: Medicare Other | Admitting: Internal Medicine

## 2011-08-05 VITALS — BP 118/72 | HR 88 | Ht 68.0 in | Wt 265.6 lb

## 2011-08-05 DIAGNOSIS — G4733 Obstructive sleep apnea (adult) (pediatric): Secondary | ICD-10-CM

## 2011-08-05 DIAGNOSIS — R05 Cough: Secondary | ICD-10-CM

## 2011-08-05 DIAGNOSIS — J40 Bronchitis, not specified as acute or chronic: Secondary | ICD-10-CM

## 2011-08-05 MED ORDER — TRAMADOL HCL 50 MG PO TABS: 50.0000 mg | ORAL_TABLET | Freq: Four times a day (QID) | ORAL | Status: DC | PRN

## 2011-08-05 MED ORDER — PROMETHAZINE-CODEINE 6.25-10 MG/5ML PO SYRP: 5.0000 mL | ORAL_SOLUTION | Freq: Four times a day (QID) | ORAL | Status: DC | PRN

## 2011-08-05 MED ORDER — BECLOMETHASONE DIPROPIONATE 80 MCG/ACT IN AERS: 2.0000 | INHALATION_SPRAY | Freq: Two times a day (BID) | RESPIRATORY_TRACT | Status: DC

## 2011-08-05 MED ORDER — MONTELUKAST SODIUM 10 MG PO TABS: 10.0000 mg | ORAL_TABLET | Freq: Every day | ORAL | Status: DC

## 2011-08-05 NOTE — Progress Notes (Signed)
Patient ID: Crystal Yoder, female    DOB: June 20, 1946, 65 y.o.   MRN: [id number]  HPI 05/13/11- 64 yoF followed for OSA, allergic rhinitis, bronchitis, complicated by hx of GERD, peripheral venous insufficiency, OHS.             Husband here. Last here December 10, 2010- note reviewed C/o cough, saying it undid her pelvic floor repair. Cough is better than with a cold last November. Still daily hacking cough, worse maybe as she lies down. Coughs more in the morning, sometimes with scant mucus. Has pet birds but just went to beach for 2 weeks, with no difference while away. Denies significant postnasal drip. Has noted some reflux , not choking with meals and not waking choked or strangled. Tries to eat earlier before bedtime.  Not using CPAP/BIPAP now at all. Still aspires to weight loss.  06/13/11- 23 yoF former smoker followed for OSA, allergic rhinitis, COPD, complicated by hx of GERD, peripheral venous insufficiency, OHS.  Husband here.  Was going to try her CPAP again, but has a cold this week. This doesn't explain why she hasn't tried any of the other weeks since she was here last. Going to chiropracter Dr Margaretha Sheffield- brings labs and shows me vitamin D and a complex cream she is using now. He diagnosed her with a variety of deficiencies, insufficiencies and inflammations based on his broad blood panel. CT chest/w cm 05/23/11- no neoplasm or other active disease. Stable mild pulmonary emphysema. We looked at these images together, pointing out the emphysema. I explained this is part of a COPD pattern remaining from her years of cigarette smoking. Found mold on window sills where blinds stayed down. Discussed changing mattress, encasings, aircleaners  Check for a1AT Coughs as she lies down.  Does not recognize either postnasal drip or reflux.  She emphasizes that she "just wants to feel better"  08/05/11- 89 yoF former smoker followed for OSA, allergic rhinitis, COPD, complicated by hx of GERD,  peripheral venous insufficiency, OHS. .... husband here   had flu vaccine Persistent cough and she emphasizes that she is tired of it. She is considering pelvic floor repair and is afraid about the cough.  coughs mostly when she lies down, nonproductive, triggered by deep breath. We discussed recent article associating cough with reflux into the lower third of the esophagus but not with microaspiration. I'm not clear if she is smoking anymore, but I think not.  Review of Systems Constitutional:   No-   weight loss, night sweats, fevers, chills,                 + fatigue, lassitude. HEENT:   No-   headaches, difficulty swallowing, tooth/dental problems, sore throat,                  Little-   sneezing, itching, ear ache, nasal congestion, post nasal drip,  CV:  No-   chest pain, orthopnea, PND, swelling in lower extremities, anasarca, dizziness, palpitations GI:  No-   heartburn, indigestion, abdominal pain, nausea, vomiting, diarrhea,                 change in bowel habits, loss of appetite Resp: No-acute  shortness of breath with exertion or at rest.  No-  excess mucus,             No-  coughing up of blood.              No-   change  in color of mucus.  No- wheezing.   Skin: No-   rash or lesions. GU: No-   dysuria, change in color of urine, no urgency or frequency.  No- flank pain. MS:  No-acute  joint pain or swelling.  No- decreased range of motion.  No- back pain. Psych:  No- change in mood or affect. No depression or anxiety.  No memory loss.      Objective:   Physical Exam General- Alert, Oriented, Affect-appropriate, Distress- none acute     Obese, nasal and hoarse Skin- rash-none, lesions- none, excoriation- none Lymphadenopathy- none Head- atraumatic            Eyes- Gross vision intact, PERRLA, conjunctivae clear secretions            Ears- Hearing, canals normal            Nose- Clear, No-Septal dev, mucus, polyps, erosion, perforation             Throat- Mallampati III-IV  , mucosa clear , drainage- none, tonsils- atrophic. Throat clearing. Neck- flexible , trachea midline, no stridor , thyroid nl, carotid no bruit Chest - symmetrical excursion , unlabored           Heart/CV- RRR , no murmur , no gallop  , no rub, nl s1 s2                           - JVD- none , edema- none, stasis changes- none, varices- none           Lung- bibasilar crackles, wheeze- none, cough- intermittent; no cough with deep breath , dullness-none, rub- none           Chest wall-  Abd- tender-no, distended-no, bowel sounds-present, HSM- no Br/ Gen/ Rectal- Not done, not indicated Extrem- cyanosis- none, clubbing, none, atrophy- none, strength- nl.                 Superficial varices. Neuro- grossly intact to observation

## 2011-08-05 NOTE — Patient Instructions (Signed)
Order- schedule PFT  Suggest elevating the head of your bed frame with a brick under each leg to reduce possible reflux when you lie down. Don't eat for an hour or two before you lie down.  Try regular use of a nonsedating antihistamine like loratadine/ Claritin Add Singulair daily as an anti-inflammatory Add sample/ script Qvar 80 - 2 puffs and rinse mouth twice daily   Try script tramadol as a cough suppressant as needed.

## 2011-08-06 ENCOUNTER — Telehealth: Payer: Self-pay | Admitting: Internal Medicine

## 2011-08-06 NOTE — Telephone Encounter (Signed)
Ok to d/c phen w/ codeine and replace with Rx for hydromet 200 ml, 1 tsp every 6 yours if needed for cough, ref x 1

## 2011-08-06 NOTE — Telephone Encounter (Signed)
Spoke with pt. She states promethazine with codeine not covered by insurance. Would like to have "what I had before"- she thinks it was hydromet. Please advise if this is okay to call this in thanks! Allergies  Allergen Reactions  . Penicillins   . Sulfonamide Derivatives

## 2011-08-07 MED ORDER — HYDROCODONE-HOMATROPINE 5-1.5 MG/5ML PO SYRP: ORAL_SOLUTION | ORAL | Status: DC

## 2011-08-07 NOTE — Assessment & Plan Note (Signed)
Probably cyclical cough. Our best target is to call centers in her brain.

## 2011-08-07 NOTE — Assessment & Plan Note (Signed)
She complains of daytime tiredness but is not willing to have another CPAP trial or sleep study.

## 2011-08-07 NOTE — Telephone Encounter (Signed)
Pt aware to d/c phenergan w/ codeine and rx for hydromet was called in to pharmacy for pt. Pharmacy is aware to d/c the phenergan w/ codeine

## 2011-08-07 NOTE — Assessment & Plan Note (Signed)
Plan schedule PFT, elevate head of bed on break

## 2011-08-08 ENCOUNTER — Ambulatory Visit: Payer: Medicare Other | Admitting: Internal Medicine

## 2011-09-01 ENCOUNTER — Ambulatory Visit (INDEPENDENT_AMBULATORY_CARE_PROVIDER_SITE_OTHER): Payer: Medicare Other | Admitting: Internal Medicine

## 2011-09-01 DIAGNOSIS — J40 Bronchitis, not specified as acute or chronic: Secondary | ICD-10-CM

## 2011-09-01 DIAGNOSIS — R05 Cough: Secondary | ICD-10-CM

## 2011-09-01 LAB — PULMONARY FUNCTION TEST

## 2011-09-01 NOTE — Progress Notes (Signed)
PFT done today. 

## 2011-10-06 ENCOUNTER — Ambulatory Visit (INDEPENDENT_AMBULATORY_CARE_PROVIDER_SITE_OTHER): Payer: Medicare Other | Admitting: Internal Medicine

## 2011-10-06 ENCOUNTER — Encounter: Payer: Self-pay | Admitting: Internal Medicine

## 2011-10-06 VITALS — BP 118/78 | HR 79 | Ht 68.0 in | Wt 266.4 lb

## 2011-10-06 DIAGNOSIS — J40 Bronchitis, not specified as acute or chronic: Secondary | ICD-10-CM

## 2011-10-06 DIAGNOSIS — R05 Cough: Secondary | ICD-10-CM

## 2011-10-06 DIAGNOSIS — G4733 Obstructive sleep apnea (adult) (pediatric): Secondary | ICD-10-CM

## 2011-10-06 MED ORDER — ACLIDINIUM BROMIDE 400 MCG/ACT IN AEPB: 1.0000 | INHALATION_SPRAY | Freq: Two times a day (BID) | RESPIRATORY_TRACT | Status: DC

## 2011-10-06 NOTE — Patient Instructions (Signed)
Try adding Tudorza  1 puff twice daily - see if it helps your cough  Ok for now to continue the Qvar and Singulair as well.  What ever you can do for exercise will help build stamina

## 2011-10-06 NOTE — Progress Notes (Signed)
Patient ID: Crystal Yoder, female    DOB: [birth year], 65 y.o.   MRN: [id number]  HPI 05/13/11- 64 yoF followed for OSA, allergic rhinitis, bronchitis, complicated by hx of GERD, peripheral venous insufficiency, OHS.             Husband here. Last here December 10, 2010- note reviewed C/o cough, saying it undid her pelvic floor repair. Cough is better than with a cold last November. Still daily hacking cough, worse maybe as she lies down. Coughs more in the morning, sometimes with scant mucus. Has pet birds but just went to beach for 2 weeks, with no difference while away. Denies significant postnasal drip. Has noted some reflux , not choking with meals and not waking choked or strangled. Tries to eat earlier before bedtime.  Not using CPAP/BIPAP now at all. Still aspires to weight loss.  06/13/11- 6 yoF former smoker followed for OSA, allergic rhinitis, COPD, complicated by hx of GERD, peripheral venous insufficiency, OHS.  Husband here.  Was going to try her CPAP again, but has a cold this week. This doesn't explain why she hasn't tried any of the other weeks since she was here last. Going to chiropracter Dr Margaretha Sheffield- brings labs and shows me vitamin D and a complex cream she is using now. He diagnosed her with a variety of deficiencies, insufficiencies and inflammations based on his broad blood panel. CT chest/w cm 05/23/11- no neoplasm or other active disease. Stable mild pulmonary emphysema. We looked at these images together, pointing out the emphysema. I explained this is part of a COPD pattern remaining from her years of cigarette smoking. Found mold on window sills where blinds stayed down. Discussed changing mattress, encasings, aircleaners  Check for a1AT Coughs as she lies down.  Does not recognize either postnasal drip or reflux.  She emphasizes that she "just wants to feel better"  08/05/11- 61 yoF former smoker followed for OSA, allergic rhinitis, COPD, complicated by hx of GERD,  peripheral venous insufficiency, OHS. .... husband here   had flu vaccine Persistent cough and she emphasizes that she is tired of it. She is considering pelvic floor repair and is afraid about the cough.  coughs mostly when she lies down, nonproductive, triggered by deep breath. We discussed recent article associating cough with reflux into the lower third of the esophagus but not with microaspiration. I'm not clear if she is smoking anymore, but I think not.  10/06/11- 19 yoF former smoker followed for OSA, allergic rhinitis, COPD, complicated by hx of GERD, peripheral venous insufficiency, OHS. ....    had flu vaccine Has had pneumonia vaccine at least twice. She got over a cold but it took about 3 weeks. Since last here cough is improved to some. Her concern is that it makes her abdomen hurts when she coughs because of pressure and weakened pelvic floor. He gave her a prescription for tramadol for cough which she did not fill. 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 We reviewed her PFT which showed mild obstructive airways disease and small airways with minimal response to bronchodilator. Diffusion 54% of predicted probably reflects her obesity.   Review of Systems Constitutional:   No-   weight loss, night sweats, fevers, chills,                 + fatigue, lassitude. HEENT:   No-   headaches, difficulty swallowing, tooth/dental problems, sore throat,  Little-   sneezing, itching, ear ache, nasal congestion, post nasal drip,  CV:  No-   chest pain, orthopnea, PND, swelling in lower extremities, anasarca, dizziness, palpitations GI:  No-   heartburn, indigestion, abdominal pain, nausea, vomiting, diarrhea,                 change in bowel habits, loss of appetite Resp: No-acute  shortness of breath with exertion or at rest.  No-  excess mucus,             No-  coughing up of blood.              No-   change in color of mucus.  No- wheezing.     Skin: No-   rash or lesions. GU: No-   dysuria, change in color of urine, no urgency or frequency.  No- flank pain. MS:  No-acute  joint pain or swelling. decreased range of motion.  No- back pain. Psych:  No- change in mood or affect. No depression or anxiety.  No memory loss.      Objective:   Physical Exam General- Alert, Oriented, Affect-appropriate, Distress- none acute     Obese,  Skin- rash-none, lesions- none, excoriation- none Lymphadenopathy- none Head- atraumatic            Eyes- Gross vision intact, PERRLA, conjunctivae clear secretions            Ears- Hearing, canals normal            Nose- Clear, No-Septal dev, mucus, polyps, erosion, perforation             Throat- Mallampati III-IV , mucosa clear , drainage- none, tonsils- atrophic. Throat clearing. Neck- flexible , trachea midline, no stridor , thyroid nl, carotid no bruit Chest - symmetrical excursion , unlabored           Heart/CV- RRR , no murmur , no gallop  , no rub, nl s1 s2                           - JVD- none , edema- none, stasis changes- none, varices- none           Lung- bibasilar crackles, wheeze- none, occasional dry cough; no cough with deep breath , dullness-none, rub- none           Chest wall-  Abd- tender-no, distended-no, bowel sounds-present, HSM- no Br/ Gen/ Rectal- Not done, not indicated Extrem- cyanosis- none, clubbing, none, atrophy- none, strength- nl.  Superficial varices. Neuro- grossly intact to observation

## 2011-10-09 NOTE — Assessment & Plan Note (Signed)
She has been unable to tolerate treatments and unable to lose weight.

## 2011-10-09 NOTE — Assessment & Plan Note (Signed)
We will try tramadol for cough while she continues Qvar and Singulair. Continue reflux precautions.Add Tudoza Strongly encourage weight loss and exercise.

## 2011-10-09 NOTE — Assessment & Plan Note (Signed)
Cough is from accommodation of mild chronic bronchitis and low grade esophageal reflux. There may be a variable asthma component.

## 2011-12-11 ENCOUNTER — Other Ambulatory Visit: Payer: Self-pay | Admitting: Internal Medicine

## 2011-12-15 ENCOUNTER — Other Ambulatory Visit: Payer: Self-pay | Admitting: Internal Medicine

## 2011-12-15 ENCOUNTER — Ambulatory Visit: Payer: Medicare Other | Admitting: Internal Medicine

## 2011-12-22 ENCOUNTER — Encounter: Payer: Self-pay | Admitting: Internal Medicine

## 2011-12-22 ENCOUNTER — Ambulatory Visit (INDEPENDENT_AMBULATORY_CARE_PROVIDER_SITE_OTHER): Payer: Medicare Other | Admitting: Internal Medicine

## 2011-12-22 DIAGNOSIS — R05 Cough: Secondary | ICD-10-CM

## 2011-12-22 DIAGNOSIS — E782 Mixed hyperlipidemia: Secondary | ICD-10-CM

## 2011-12-22 DIAGNOSIS — G4733 Obstructive sleep apnea (adult) (pediatric): Secondary | ICD-10-CM

## 2011-12-22 DIAGNOSIS — I428 Other cardiomyopathies: Secondary | ICD-10-CM

## 2011-12-22 DIAGNOSIS — E785 Hyperlipidemia, unspecified: Secondary | ICD-10-CM

## 2011-12-22 NOTE — Assessment & Plan Note (Signed)
Volume status looks good,  I would keep on same regimen.  BP has been better on other visits.

## 2011-12-22 NOTE — Patient Instructions (Signed)
Fasting Lab work on 3/4 at [facility]. Office  Your physician wants you to follow-up in: December 2013 with Dr.Ross You will receive a reminder letter in the mail two months in advance. If you don't receive a letter, please call our office to schedule the follow-up appointment.

## 2011-12-22 NOTE — Assessment & Plan Note (Signed)
Encouraged her to use inhalers.

## 2011-12-22 NOTE — Assessment & Plan Note (Signed)
Encouraged her to start using CPAP again.

## 2011-12-22 NOTE — Assessment & Plan Note (Signed)
Need to check fasting lipids.  I would recomm aggessive lipid control.

## 2011-12-22 NOTE — Progress Notes (Signed)
HPI Patient is a 66 year old with a history of idiopathic cardiomyopathy,  dyslipidemia, obesity, GERD, arthritis. Last echo showed the LVEF was 45 to 50%. She was last in clinic in December.  Since seen she has had recurrent problems with incontinence. She is back to using a pesary   Last lipid panel in Spring 2012. LDL was in the 120s. She is interested in tryng [employer].  Seen by Cl [facility] last in December  Qvar and Spiriva helped  She backed off and cough has come back  Thinking of starting again. Problems with pesaries.  Followed in OB. Works out 3x per wk.    Allergies  Allergen Reactions  . Penicillins   . Sulfonamide Derivatives     Current Outpatient Prescriptions  Medication Sig Dispense Refill  . beclomethasone (QVAR) 80 MCG/ACT inhaler Inhale 2 puffs into the lungs 2 (two) times daily. Rinse mouth  1 Inhaler  2  . carvedilol (COREG) 3.125 MG tablet take 1 tablet twice a day  60 tablet  6  . dextromethorphan-guaiFENesin (MUCINEX DM) 30-600 MG per 12 hr tablet Take 1 tablet by mouth 2 (two) times daily as needed.       . fluticasone (FLONASE) 50 MCG/ACT nasal spray INSTILL 2 SPRAYS INTO EACH NOSTRIL ONCE A DAY  16 g  PRN  . fluticasone (VERAMYST) 27.5 MCG/SPRAY nasal spray Place 2 sprays into the nose 2 (two) times daily as needed.       Marland Kitchen HYDROcodone-homatropine (HYDROMET) 5-1.5 MG/5ML syrup 1 tsp every 6 hrs as needed for cough  200 mL  1  . loratadine (CLARITIN) 10 MG tablet Take 10 mg by mouth daily as needed.       Marland Kitchen losartan (COZAAR) 50 MG tablet take 1 tablet by mouth once daily  30 tablet  6  . montelukast (SINGULAIR) 10 MG tablet Take 1 tablet (10 mg total) by mouth daily.  30 tablet  prn  . NON FORMULARY Clear vite -sf K24       . pantoprazole (PROTONIX) 40 MG tablet Take 40 mg by mouth as needed.         Past Medical History  Diagnosis Date  . Other primary cardiomyopathies   . Esophageal reflux   . Unspecified venous (peripheral) insufficiency   .  Obesity, unspecified   . Arthropathy, unspecified, site unspecified   . Other acute sinusitis   . Obstructive sleep apnea (adult) (pediatric)   . Arthritis     Past Surgical History  Procedure Date  . Replacement total knee   . Bladder surgery   . Splenectomy   . Pelvic floor repair     Family History  Problem Relation Age of Onset  . Prostate cancer Father     History   Social History  . Marital Status: Married    Spouse Name: N/A    Number of Children: N/A  . Years of Education: N/A   Occupational History  . School teacher    Social History Main Topics  . Smoking status: Former Smoker -- 0.5 packs/day for 10 years    Types: Cigarettes    Quit date: 10/28/1979  . Smokeless tobacco: Never Used  . Alcohol Use: Yes     Glass of wine per day  . Drug Use: Not on file  . Sexually Active: Not on file   Other Topics Concern  . Not on file   Social History Narrative  . No narrative on file    Review  of Systems:  All systems reviewed.  They are negative to the above problem except as previously stated.  Vital Signs: BP 130/88  Ht 5\' 8"  (1.727 m)  Wt 264 lb (119.75 kg)  BMI 40.14 kg/m2  Physical Exam  Patient is in NAD HEENT:  Normocephalic, atraumatic. EOMI, PERRLA.  Neck: JVP is normal. No thyromegaly. No bruits.  Lungs: clear to auscultation. No rales no wheezes.  Heart: Regular rate and rhythm. Normal S1, S2. No S3.   No significant murmurs. PMI not displaced.  Abdomen:  Supple, nontender. Normal bowel sounds. No masses. No hepatomegaly.  Extremities:   Good distal pulses throughout. No lower extremity edema.  Musculoskeletal :moving all extremities.  Neuro:   alert and oriented x3.  CN II-XII grossly intact.  EKG:  SR.  LVH with strain pattern.  Assessment and Plan:

## 2011-12-29 ENCOUNTER — Other Ambulatory Visit (INDEPENDENT_AMBULATORY_CARE_PROVIDER_SITE_OTHER): Payer: Medicare Other

## 2011-12-29 ENCOUNTER — Ambulatory Visit (INDEPENDENT_AMBULATORY_CARE_PROVIDER_SITE_OTHER): Payer: Medicare Other | Admitting: Internal Medicine

## 2011-12-29 ENCOUNTER — Encounter: Payer: Self-pay | Admitting: Internal Medicine

## 2011-12-29 VITALS — BP 122/74 | HR 84 | Ht 68.0 in | Wt 267.8 lb

## 2011-12-29 DIAGNOSIS — G4733 Obstructive sleep apnea (adult) (pediatric): Secondary | ICD-10-CM

## 2011-12-29 DIAGNOSIS — R05 Cough: Secondary | ICD-10-CM

## 2011-12-29 DIAGNOSIS — J40 Bronchitis, not specified as acute or chronic: Secondary | ICD-10-CM

## 2011-12-29 DIAGNOSIS — I428 Other cardiomyopathies: Secondary | ICD-10-CM

## 2011-12-29 DIAGNOSIS — E782 Mixed hyperlipidemia: Secondary | ICD-10-CM

## 2011-12-29 LAB — LIPID PANEL
Cholesterol: 190 mg/dL (ref 0–200)
HDL: 63.1 mg/dL (ref >39.00)
LDL Cholesterol: 115 mg/dL — ABNORMAL HIGH (ref 0–99)
VLDL: 11.6 mg/dL (ref 0.0–40.0)

## 2011-12-29 LAB — BASIC METABOLIC PANEL
BUN: 14 mg/dL (ref 6–23)
Calcium: 9.2 mg/dL (ref 8.4–10.5)
GFR: 76.17 mL/min (ref >60.00)
Glucose, Bld: 83 mg/dL (ref 70–99)
Potassium: 4.7 mEq/L (ref 3.5–5.1)
Sodium: 142 mEq/L (ref 135–145)

## 2011-12-29 NOTE — Patient Instructions (Signed)
Sample [employer]- ok to try it   1 puff twice daily  I will ask Dr Tenny Craw about substituting for carvedilol long enough to assess its impact on your cough and vivid dreams  Continue Qvar and Singulair

## 2011-12-29 NOTE — Progress Notes (Signed)
Patient ID: Crystal Yoder, female    DOB: August 11, 1946, 66 y.o.   MRN: [id number]  HPI 05/13/11- 64 yoF followed for OSA, allergic rhinitis, bronchitis, complicated by hx of GERD, peripheral venous insufficiency, OHS.             Husband here. Last here December 10, 2010- note reviewed C/o cough, saying it undid her pelvic floor repair. Cough is better than with a cold last November. Still daily hacking cough, worse maybe as she lies down. Coughs more in the morning, sometimes with scant mucus. Has pet birds but just went to beach for 2 weeks, with no difference while away. Denies significant postnasal drip. Has noted some reflux , not choking with meals and not waking choked or strangled. Tries to eat earlier before bedtime.  Not using CPAP/BIPAP now at all. Still aspires to weight loss.  06/13/11- 50 yoF former smoker followed for OSA, allergic rhinitis, COPD, complicated by hx of GERD, peripheral venous insufficiency, OHS.  Husband here.  Was going to try her CPAP again, but has a cold this week. This doesn't explain why she hasn't tried any of the other weeks since she was here last. Going to chiropracter Dr Margaretha Sheffield- brings labs and shows me vitamin D and a complex cream she is using now. He diagnosed her with a variety of deficiencies, insufficiencies and inflammations based on his broad blood panel. CT chest/w cm 05/23/11- no neoplasm or other active disease. Stable mild pulmonary emphysema. We looked at these images together, pointing out the emphysema. I explained this is part of a COPD pattern remaining from her years of cigarette smoking. Found mold on window sills where blinds stayed down. Discussed changing mattress, encasings, aircleaners  Check for a1AT Coughs as she lies down.  Does not recognize either postnasal drip or reflux.  She emphasizes that she "just wants to feel better"  08/05/11- 15 yoF former smoker followed for OSA, allergic rhinitis, COPD, complicated by hx of GERD,  peripheral venous insufficiency, OHS. .... husband here   had flu vaccine Persistent cough and she emphasizes that she is tired of it. She is considering pelvic floor repair and is afraid about the cough.  coughs mostly when she lies down, nonproductive, triggered by deep breath. We discussed recent article associating cough with reflux into the lower third of the esophagus but not with microaspiration. I'm not clear if she is smoking anymore, but I think not.  10/06/11- 11 yoF former smoker followed for OSA, allergic rhinitis, COPD, complicated by hx of GERD, peripheral venous insufficiency, OHS. ....    had flu vaccine Has had pneumonia vaccine at least twice. She got over a cold but it took about 3 weeks. Since last here cough is improved to some. Her concern is that it makes her abdomen hurts when she coughs because of pressure and weakened pelvic floor. He gave her a prescription for tramadol for cough which she did not fill. 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 We reviewed her PFT which showed mild obstructive airways disease and small airways with minimal response to bronchodilator. Diffusion 54% of predicted probably reflects her obesity.  12/29/11 64 yoF former smoker followed for OSA, allergic rhinitis, COPD, complicated by hx of GERD, peripheral venous insufficiency, OHS. ....    had flu vaccine Cough has improved so she did not try sample [place]. Uses cough syrup only every 2 weeks. Recently got Singulair refilled. Using acid blocker irregularly. Now  on carvedilol, which may be causing vivid dreams and increasing her cough. We discussed the role of beta blocker drugs. . Continues to blame cough for un-doing her pelvic floor repair. She is afraid to try the surgery again unless her cough can be controlled. Still using BiPAP off and on. PFT: 09/01/2011-mild obstructive airways disease with slight response to bronchodilator. Diffusion moderately reduced at  least partly due to to her obesity. FEV1/FVC 0.70. Slight response to bronchodilator. DLCO 54%.   Review of Systems-see HPI Constitutional:   No-   weight loss, night sweats, fevers, chills,                 + fatigue, lassitude. HEENT:   No-   headaches, difficulty swallowing, tooth/dental problems, sore throat,                  Little-   sneezing, itching, ear ache, nasal congestion, post nasal drip,  CV:  No-   chest pain, orthopnea, PND, swelling in lower extremities, anasarca, dizziness, palpitations GI:  No-   heartburn, indigestion, abdominal pain, nausea, vomiting, diarrhea,                 change in bowel habits, loss of appetite Resp: No-acute  shortness of breath with exertion or at rest.  No-  excess mucus,             No-  coughing up of blood.              No-   change in color of mucus.  No- wheezing.   Skin: No-   rash or lesions. GU: No-   dysuria,  MS:  No-acute  joint pain or swelling. Psych:  No- change in mood or affect. No depression or anxiety.  No memory loss.      Objective:   Physical Exam General- Alert, Oriented, Affect-appropriate, Distress- none acute     Obese,  Skin- rash-none, lesions- none, excoriation- none Lymphadenopathy- none Head- atraumatic            Eyes- Gross vision intact, PERRLA, conjunctivae clear secretions            Ears- Hearing, canals normal            Nose- Clear, No-Septal dev, mucus, polyps, erosion, perforation             Throat- Mallampati III-IV , mucosa clear , drainage- none, tonsils- atrophic. Throat clearing. Neck- flexible , trachea midline, no stridor , thyroid nl, carotid no bruit Chest - symmetrical excursion , unlabored           Heart/CV- RRR , no murmur , no gallop  , no rub, nl s1 s2                           - JVD- none , edema- none, stasis changes- none, varices- none           Lung- bibasilar crackles, wheeze- none, occasional dry cough; no cough with deep breath , dullness-none, rub- none           Chest  wall-  Abd-  Br/ Gen/ Rectal- Not done, not indicated Extrem- cyanosis- none, clubbing, none, atrophy- none, strength- nl.  Superficial varices. Neuro- grossly intact to observation

## 2011-12-30 ENCOUNTER — Other Ambulatory Visit: Payer: Self-pay | Admitting: Internal Medicine

## 2011-12-30 ENCOUNTER — Other Ambulatory Visit: Payer: Self-pay

## 2011-12-30 MED ORDER — LOSARTAN POTASSIUM 50 MG PO TABS: 50.0000 mg | ORAL_TABLET | Freq: Every day | ORAL | Status: DC

## 2011-12-30 MED ORDER — CARVEDILOL 3.125 MG PO TABS: 3.1250 mg | ORAL_TABLET | Freq: Two times a day (BID) | ORAL | Status: DC

## 2012-01-02 NOTE — Assessment & Plan Note (Signed)
Plan try [place]

## 2012-01-02 NOTE — Assessment & Plan Note (Signed)
Multifactorial cough, probably cyclical, incorporating low level recurrent reflux. Question role of a beta blocker drug. Plan- sample [place].

## 2012-01-02 NOTE — Assessment & Plan Note (Addendum)
Still tries occasionally, but has not been successful using CPAP on a regular basis despite counseling. Weight loss would help significantly.

## 2012-01-06 ENCOUNTER — Encounter: Payer: Self-pay | Admitting: Internal Medicine

## 2012-01-06 ENCOUNTER — Telehealth: Payer: Self-pay | Admitting: Internal Medicine

## 2012-01-06 DIAGNOSIS — E785 Hyperlipidemia, unspecified: Secondary | ICD-10-CM

## 2012-01-06 NOTE — Telephone Encounter (Signed)
FU Call: Pt returning call from our office. Please call back to discuss further.  

## 2012-01-06 NOTE — Telephone Encounter (Signed)
Patient called, stating she was returning phone call.Patient was told LDL 115.Dr.Ross advised Lipitor10 mg daily.Patient refused Lipitor,states she will watch diet more closely and repeat lipid panel in 8 weeks.

## 2012-01-12 ENCOUNTER — Telehealth: Payer: Self-pay | Admitting: *Deleted

## 2012-01-12 NOTE — Telephone Encounter (Signed)
Called patient to advise that Dr.Ross was OK if she wanted to wean off Coreg because it might be causing vivid dreams and or a chronic cough. In speaking with her in detail she advised me that does not take her Protonix every day and often wakes up with a horse throat and sometimes has chest pain from acid reflux. She will now take the Protonix every day and hold off on stopping the Coreg. Will let Dr.Ross know about above.

## 2012-03-02 ENCOUNTER — Other Ambulatory Visit (INDEPENDENT_AMBULATORY_CARE_PROVIDER_SITE_OTHER): Payer: Medicare Other

## 2012-03-02 DIAGNOSIS — E785 Hyperlipidemia, unspecified: Secondary | ICD-10-CM

## 2012-03-02 LAB — LIPID PANEL
Cholesterol: 166 mg/dL (ref 0–200)
VLDL: 9.6 mg/dL (ref 0.0–40.0)

## 2012-03-15 ENCOUNTER — Ambulatory Visit: Payer: Medicare Other | Admitting: Internal Medicine

## 2012-03-16 ENCOUNTER — Other Ambulatory Visit: Payer: Self-pay | Admitting: Family Medicine

## 2012-03-16 DIAGNOSIS — Z1231 Encounter for screening mammogram for malignant neoplasm of breast: Secondary | ICD-10-CM

## 2012-04-19 ENCOUNTER — Encounter: Payer: Self-pay | Admitting: Internal Medicine

## 2012-04-19 ENCOUNTER — Other Ambulatory Visit: Payer: Medicare Other

## 2012-04-19 ENCOUNTER — Ambulatory Visit (INDEPENDENT_AMBULATORY_CARE_PROVIDER_SITE_OTHER): Payer: Medicare Other | Admitting: Internal Medicine

## 2012-04-19 VITALS — BP 108/80 | HR 88 | Ht 68.0 in | Wt 274.8 lb

## 2012-04-19 DIAGNOSIS — R053 Chronic cough: Secondary | ICD-10-CM

## 2012-04-19 DIAGNOSIS — G4733 Obstructive sleep apnea (adult) (pediatric): Secondary | ICD-10-CM

## 2012-04-19 DIAGNOSIS — J310 Chronic rhinitis: Secondary | ICD-10-CM

## 2012-04-19 DIAGNOSIS — R05 Cough: Secondary | ICD-10-CM

## 2012-04-19 MED ORDER — TRAMADOL HCL 50 MG PO TABS: ORAL_TABLET | ORAL | Status: DC

## 2012-04-19 NOTE — Patient Instructions (Addendum)
Order- allergy profile     Dx rhinitis  Script to try tramadol for cough

## 2012-04-19 NOTE — Progress Notes (Signed)
Patient ID: Crystal Yoder, female    DOB: August 11, 1946, 66 y.o.   MRN: [id number]  HPI 05/13/11- 66 yoF followed for OSA, allergic rhinitis, bronchitis, complicated by hx of GERD, peripheral venous insufficiency, OHS.             Husband here. Last here December 10, 2010- note reviewed C/o cough, saying it undid her pelvic floor repair. Cough is better than with a cold last November. Still daily hacking cough, worse maybe as she lies down. Coughs more in the morning, sometimes with scant mucus. Has pet birds but just went to beach for 2 weeks, with no difference while away. Denies significant postnasal drip. Has noted some reflux , not choking with meals and not waking choked or strangled. Tries to eat earlier before bedtime.  Not using CPAP/BIPAP now at all. Still aspires to weight loss.  06/13/11- 66 yoF former smoker followed for OSA, allergic rhinitis, COPD, complicated by hx of GERD, peripheral venous insufficiency, OHS.  Husband here.  Was going to try her CPAP again, but has a cold this week. This doesn't explain why she hasn't tried any of the other weeks since she was here last. Going to chiropracter Dr Margaretha Sheffield- brings labs and shows me vitamin D and a complex cream she is using now. He diagnosed her with a variety of deficiencies, insufficiencies and inflammations based on his broad blood panel. CT chest/w cm 05/23/11- no neoplasm or other active disease. Stable mild pulmonary emphysema. We looked at these images together, pointing out the emphysema. I explained this is part of a COPD pattern remaining from her years of cigarette smoking. Found mold on window sills where blinds stayed down. Discussed changing mattress, encasings, aircleaners  Check for a1AT Coughs as she lies down.  Does not recognize either postnasal drip or reflux.  She emphasizes that she "just wants to feel better"  08/05/11- 66 yoF former smoker followed for OSA, allergic rhinitis, COPD, complicated by hx of GERD,  peripheral venous insufficiency, OHS. .... husband here   had flu vaccine Persistent cough and she emphasizes that she is tired of it. She is considering pelvic floor repair and is afraid about the cough.  coughs mostly when she lies down, nonproductive, triggered by deep breath. We discussed recent article associating cough with reflux into the lower third of the esophagus but not with microaspiration. I'm not clear if she is smoking anymore, but I think not.  10/06/11- 66 yoF former smoker followed for OSA, allergic rhinitis, COPD, complicated by hx of GERD, peripheral venous insufficiency, OHS. ....    had flu vaccine Has had pneumonia vaccine at least twice. She got over a cold but it took about 3 weeks. Since last here cough is improved to some. Her concern is that it makes her abdomen hurts when she coughs because of pressure and weakened pelvic floor. He gave her a prescription for tramadol for cough which she did not fill. 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 We reviewed her PFT which showed mild obstructive airways disease and small airways with minimal response to bronchodilator. Diffusion 54% of predicted probably reflects her obesity.  12/29/11 64 yoF former smoker followed for OSA, allergic rhinitis, COPD, complicated by hx of GERD, peripheral venous insufficiency, OHS. ....    had flu vaccine Cough has improved so she did not try sample [place]. Uses cough syrup only every 2 weeks. Recently got Singulair refilled. Using acid blocker irregularly. Now  on carvedilol, which may be causing vivid dreams and increasing her cough. We discussed the role of beta blocker drugs. . Continues to blame cough for un-doing her pelvic floor repair. She is afraid to try the surgery again unless her cough can be controlled. Still using BiPAP off and on. PFT: 09/01/2011-mild obstructive airways disease with slight response to bronchodilator. Diffusion moderately reduced at  least partly due to to her obesity. FEV1/FVC 0.70. Slight response to bronchodilator. DLCO 54%.  04/19/12- 66 yoF former smoker followed for OSA, allergic rhinitis, COPD, complicated by hx of GERD, peripheral venous insufficiency, OHS. ....    had flu vaccine  Has not been using [place]- never tried it; has been overwhelmed with alot of stuff lately; Son moving back home.  Using BiPAP every night-recently got new mask but has not tried it yet. Still considering pelvic reconstructive surgery if cough can be controlled. Increased nasal congestion past 2 or 3 days.  Review of Systems-see HPI Constitutional:   No-   weight loss, night sweats, fevers, chills,                 + fatigue, lassitude. HEENT:   No-   headaches, difficulty swallowing, tooth/dental problems, sore throat,                  Little-   sneezing, itching, ear ache, +nasal congestion, post nasal drip,  CV:  No-   chest pain, orthopnea, PND, swelling in lower extremities, anasarca, dizziness, palpitations GI:  No-   heartburn, indigestion, abdominal pain, nausea, vomiting, diarrhea,                 change in bowel habits, loss of appetite Resp: No-acute  shortness of breath with exertion or at rest.  No-  excess mucus,             No-  coughing up of blood.  + Persistent dry cough            No-   change in color of mucus.  No- wheezing.   Skin: No-   rash or lesions. GU: No-   dysuria,  MS:  No-acute  joint pain or swelling. Psych:  No- change in mood or affect. No depression or anxiety.  No memory loss.  Objective:   Physical Exam BP 108/80  Pulse 88  Ht 5\' 8"  (1.727 m)  Wt 274 lb 12.8 oz (124.648 kg)  BMI 41.78 kg/m2  SpO2 95%  General- Alert, Oriented, Affect-appropriate, Distress- none acute     Obese,  Skin- rash-none, lesions- none, excoriation- none Lymphadenopathy- none Head- atraumatic            Eyes- Gross vision intact, PERRLA, conjunctivae clear secretions            Ears- Hearing, canals normal             Nose- Clear, No-Septal dev, mucus, polyps, erosion, perforation             Throat- Mallampati III-IV , mucosa clear , drainage- none, tonsils- atrophic. Throat clearing. Neck- flexible , trachea midline, no stridor , thyroid nl, carotid no bruit Chest - symmetrical excursion , unlabored           Heart/CV- RRR , no murmur , no gallop  , no rub, nl s1 s2                           - JVD- none ,  edema- none, stasis changes- none, varices- none           Lung- bibasilar crackles, wheeze- none, occasional dry cough; no cough with deep breath , dullness-none, rub- none           Chest wall-  Abd-  Br/ Gen/ Rectal- Not done, not indicated Extrem- cyanosis- none, clubbing, none, atrophy- none, strength- nl.  Superficial varices. Neuro- grossly intact to observation

## 2012-04-21 LAB — ALLERGY FULL PROFILE
Allergen, D pternoyssinus,d7: <0.1 kU/L (ref <0.35)
Allergen,Goose feathers, e70: <0.1 kU/L (ref <0.35)
Alternaria Alternata: <0.1 kU/L (ref <0.35)
Aspergillus fumigatus, IgG: <0.1 kU/L (ref <0.35)
Bermuda Grass: 0.17 kU/L (ref <0.35)
Candida Albicans: <0.1 kU/L (ref <0.35)
Common Ragweed: 0.13 kU/L (ref <0.35)
D. farinae: <0.1 kU/L (ref <0.35)
Dog Dander: <0.1 kU/L (ref <0.35)
Goldenrod: <0.1 kU/L (ref <0.35)
Helminthosporium halodes: <0.1 kU/L (ref <0.35)
House Dust Hollister: <0.1 kU/L (ref <0.35)
IgE (Immunoglobulin E), Serum: 64.9 IU/mL (ref 0.0–180.0)
Lamb's Quarters: <0.1 kU/L (ref <0.35)
Oak: 0.17 kU/L (ref <0.35)
Plantain: <0.1 kU/L (ref <0.35)
Stemphylium Botryosum: <0.1 kU/L (ref <0.35)

## 2012-04-22 ENCOUNTER — Inpatient Hospital Stay: Admission: RE | Admit: 2012-04-22 | Payer: Medicare Other | Source: Ambulatory Visit

## 2012-04-22 NOTE — Progress Notes (Signed)
Quick Note:  Pt aware of results. ______ 

## 2012-04-24 NOTE — Assessment & Plan Note (Signed)
She very typically will not take treatments, just as she has now gotten around to trying the [place] I gave her. I explained that I wanted to see if that chemistry had a useful anticholinergic effect on her cough. We showed her how to use the device again.

## 2012-04-24 NOTE — Assessment & Plan Note (Signed)
Allergy testing has not shown strong atopy, but there may be some specific seasonal factors as well as irritant, nonallergic rhinitis.

## 2012-04-24 NOTE — Assessment & Plan Note (Signed)
Says she is now using her BiPAP regularly. She has been highly inconsistent about this in the past.

## 2012-05-18 ENCOUNTER — Ambulatory Visit
Admission: RE | Admit: 2012-05-18 | Discharge: 2012-05-18 | Disposition: A | Discharge status: Home / Self Care | Payer: Medicare Other | Source: Ambulatory Visit | Attending: Family Medicine | Admitting: Family Medicine

## 2012-05-18 DIAGNOSIS — Z1231 Encounter for screening mammogram for malignant neoplasm of breast: Secondary | ICD-10-CM

## 2012-05-20 ENCOUNTER — Encounter: Payer: Self-pay | Admitting: Internal Medicine

## 2012-05-20 NOTE — Telephone Encounter (Signed)
This encounter was created in error - please disregard.

## 2012-05-20 NOTE — Telephone Encounter (Signed)
New msg  Pt wants lab results to be sent to Dr Cala Bradford office [phone] fax number

## 2012-07-08 ENCOUNTER — Other Ambulatory Visit: Payer: Self-pay | Admitting: Internal Medicine

## 2012-07-27 ENCOUNTER — Other Ambulatory Visit: Payer: Self-pay | Admitting: Internal Medicine

## 2012-07-28 NOTE — Telephone Encounter (Signed)
Ok to refill 

## 2012-07-28 NOTE — Telephone Encounter (Signed)
Please advise if okay to refill. Thanks.  

## 2012-07-29 ENCOUNTER — Telehealth: Payer: Self-pay | Admitting: Internal Medicine

## 2012-07-29 MED ORDER — AZITHROMYCIN 250 MG PO TABS: 250.0000 mg | ORAL_TABLET | Freq: Once | ORAL | Status: DC

## 2012-07-29 NOTE — Telephone Encounter (Signed)
Per CY-okay to have Zpak #1 take as directed no refills.   Pt aware that RX has been sent.

## 2012-07-29 NOTE — Telephone Encounter (Signed)
I have already called this to her pharmacy.

## 2012-07-29 NOTE — Telephone Encounter (Signed)
Per CY-okay to refill this time.  

## 2012-07-29 NOTE — Telephone Encounter (Signed)
Called and spoke with pt and she stated that she is going out of town early in the morning and she is needing a refill of the hydromet called to the pharmacy today.  CY please advise. Thanks  Allergies  Allergen Reactions  . Penicillins   . Sulfonamide Derivatives

## 2012-07-29 NOTE — Telephone Encounter (Signed)
Called and spoke with pt and she stated that she is leaving for the beach in the  Morning and is requesting that a zpak be sent to the pharmacy. CY please advise. Thank  Allergies  Allergen Reactions  . Penicillins   . Sulfonamide Derivatives

## 2012-08-13 ENCOUNTER — Other Ambulatory Visit: Payer: Self-pay | Admitting: Internal Medicine

## 2012-08-19 ENCOUNTER — Other Ambulatory Visit: Payer: Self-pay | Admitting: Internal Medicine

## 2012-08-20 ENCOUNTER — Telehealth: Payer: Self-pay | Admitting: Internal Medicine

## 2012-08-20 NOTE — Telephone Encounter (Signed)
Per CY okay to refill #30 with 3 refills.

## 2012-08-20 NOTE — Telephone Encounter (Signed)
Please advise if okay to give refill. Thanks.  

## 2012-08-20 NOTE — Telephone Encounter (Signed)
Last ov 04/19/12.  Pending OV 10/18/12. Refill request for Tramadol 50 mg . #20 w/ 3 refills given on 07/14/12. Pls advise.

## 2012-08-20 NOTE — Telephone Encounter (Signed)
RX called to the pharmacy

## 2012-08-21 NOTE — Telephone Encounter (Signed)
Ok to refill tramadol 

## 2012-08-23 MED ORDER — TRAMADOL HCL 50 MG PO TABS: 50.0000 mg | ORAL_TABLET | Freq: Four times a day (QID) | ORAL | Status: DC | PRN

## 2012-10-18 ENCOUNTER — Ambulatory Visit (INDEPENDENT_AMBULATORY_CARE_PROVIDER_SITE_OTHER): Payer: Medicare Other | Admitting: Internal Medicine

## 2012-10-18 ENCOUNTER — Encounter: Payer: Self-pay | Admitting: Internal Medicine

## 2012-10-18 VITALS — BP 118/76 | HR 72 | Ht 68.0 in | Wt 276.8 lb

## 2012-10-18 DIAGNOSIS — J42 Unspecified chronic bronchitis: Secondary | ICD-10-CM

## 2012-10-18 DIAGNOSIS — R05 Cough: Secondary | ICD-10-CM

## 2012-10-18 MED ORDER — HYDROCODONE-HOMATROPINE 5-1.5 MG/5ML PO SYRP: 5.0000 mL | ORAL_SOLUTION | Freq: Four times a day (QID) | ORAL | Status: DC | PRN

## 2012-10-18 NOTE — Progress Notes (Signed)
Patient ID: Crystal Yoder, female    DOB: August 11, 1946, 66 y.o.   MRN: [id number]  HPI 05/13/11- 64 yoF followed for OSA, allergic rhinitis, bronchitis, complicated by hx of GERD, peripheral venous insufficiency, OHS.             Husband here. Last here December 10, 2010- note reviewed C/o cough, saying it undid her pelvic floor repair. Cough is better than with a cold last November. Still daily hacking cough, worse maybe as she lies down. Coughs more in the morning, sometimes with scant mucus. Has pet birds but just went to beach for 2 weeks, with no difference while away. Denies significant postnasal drip. Has noted some reflux , not choking with meals and not waking choked or strangled. Tries to eat earlier before bedtime.  Not using CPAP/BIPAP now at all. Still aspires to weight loss.  06/13/11- 50 yoF former smoker followed for OSA, allergic rhinitis, COPD, complicated by hx of GERD, peripheral venous insufficiency, OHS.  Husband here.  Was going to try her CPAP again, but has a cold this week. This doesn't explain why she hasn't tried any of the other weeks since she was here last. Going to chiropracter Dr Margaretha Sheffield- brings labs and shows me vitamin D and a complex cream she is using now. He diagnosed her with a variety of deficiencies, insufficiencies and inflammations based on his broad blood panel. CT chest/w cm 05/23/11- no neoplasm or other active disease. Stable mild pulmonary emphysema. We looked at these images together, pointing out the emphysema. I explained this is part of a COPD pattern remaining from her years of cigarette smoking. Found mold on window sills where blinds stayed down. Discussed changing mattress, encasings, aircleaners  Check for a1AT Coughs as she lies down.  Does not recognize either postnasal drip or reflux.  She emphasizes that she "just wants to feel better"  08/05/11- 15 yoF former smoker followed for OSA, allergic rhinitis, COPD, complicated by hx of GERD,  peripheral venous insufficiency, OHS. .... husband here   had flu vaccine Persistent cough and she emphasizes that she is tired of it. She is considering pelvic floor repair and is afraid about the cough.  coughs mostly when she lies down, nonproductive, triggered by deep breath. We discussed recent article associating cough with reflux into the lower third of the esophagus but not with microaspiration. I'm not clear if she is smoking anymore, but I think not.  10/06/11- 11 yoF former smoker followed for OSA, allergic rhinitis, COPD, complicated by hx of GERD, peripheral venous insufficiency, OHS. ....    had flu vaccine Has had pneumonia vaccine at least twice. She got over a cold but it took about 3 weeks. Since last here cough is improved to some. Her concern is that it makes her abdomen hurts when she coughs because of pressure and weakened pelvic floor. He gave her a prescription for tramadol for cough which she did not fill. 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 We reviewed her PFT which showed mild obstructive airways disease and small airways with minimal response to bronchodilator. Diffusion 54% of predicted probably reflects her obesity.  12/29/11 64 yoF former smoker followed for OSA, allergic rhinitis, COPD, complicated by hx of GERD, peripheral venous insufficiency, OHS. ....    had flu vaccine Cough has improved so she did not try sample [place]. Uses cough syrup only every 2 weeks. Recently got Singulair refilled. Using acid blocker irregularly. Now  on carvedilol, which may be causing vivid dreams and increasing her cough. We discussed the role of beta blocker drugs. . Continues to blame cough for un-doing her pelvic floor repair. She is afraid to try the surgery again unless her cough can be controlled. Still using BiPAP off and on. PFT: 09/01/2011-mild obstructive airways disease with slight response to bronchodilator. Diffusion moderately reduced at  least partly due to to her obesity. FEV1/FVC 0.70. Slight response to bronchodilator. DLCO 54%.  04/19/12- 73 yoF former smoker followed for OSA, allergic rhinitis, COPD, complicated by hx of GERD, peripheral venous insufficiency, OHS. ....    had flu vaccine  Has not been using [place]- never tried it; has been overwhelmed with alot of stuff lately; Son moving back home.  Using BiPAP every night-recently got new mask but has not tried it yet. Still considering pelvic reconstructive surgery if cough can be controlled. Increased nasal congestion past 2 or 3 days.  10/18/12- 28 yoF former smoker followed for OSA, allergic rhinitis, COPD, complicated by hx of GERD, peripheral venous insufficiency, OHS. ....    had flu vaccine      Review of Systems-see HPI Constitutional:   No-   weight loss, night sweats, fevers, chills,                 + fatigue, lassitude. HEENT:   No-   headaches, difficulty swallowing, tooth/dental problems, sore throat,                  Little-   sneezing, itching, ear ache, +nasal congestion, post nasal drip,  CV:  No-   chest pain, orthopnea, PND, swelling in lower extremities, anasarca, dizziness, palpitations GI:  No-   heartburn, indigestion, abdominal pain, nausea, vomiting, diarrhea,                 change in bowel habits, loss of appetite Resp: No-acute  shortness of breath with exertion or at rest.  No-  excess mucus,             No-  coughing up of blood.  + Persistent dry cough            No-   change in color of mucus.  No- wheezing.   Skin: No-   rash or lesions. GU: No-   dysuria,  MS:  No-acute  joint pain or swelling. Psych:  No- change in mood or affect. No depression or anxiety.  No memory loss.  Objective:   Physical Exam BP 108/80  Pulse 88  Ht 5\' 8"  (1.727 m)  Wt 274 lb 12.8 oz (124.648 kg)  BMI 41.78 kg/m2  SpO2 95%  General- Alert, Oriented, Affect-appropriate, Distress- none acute     Obese,  Skin- rash-none, lesions- none,  excoriation- none Lymphadenopathy- none Head- atraumatic            Eyes- Gross vision intact, PERRLA, conjunctivae clear secretions            Ears- Hearing, canals normal            Nose- Clear, No-Septal dev, mucus, polyps, erosion, perforation             Throat- Mallampati III-IV , mucosa clear , drainage- none, tonsils- atrophic. Throat clearing. Neck- flexible , trachea midline, no stridor , thyroid nl, carotid no bruit Chest - symmetrical excursion , unlabored           Heart/CV- RRR , no murmur , no gallop  , no rub,  nl s1 s2                           - JVD- none , edema- none, stasis changes- none, varices- none           Lung- bibasilar crackles, wheeze- none, occasional dry cough; no cough with deep breath , dullness-none, rub- none           Chest wall-  Abd-  Br/ Gen/ Rectal- Not done, not indicated Extrem- cyanosis- none, clubbing, none, atrophy- none, strength- nl.  Superficial varices. Neuro- grossly intact to observation          Patient ID: Crystal Yoder, female    DOB: March 29, 1946, 66 y.o.   MRN: [id number]  HPI 05/13/11- 64 yoF followed for OSA, allergic rhinitis, bronchitis, complicated by hx of GERD, peripheral venous insufficiency, OHS.             Husband here. Last here December 10, 2010- note reviewed C/o cough, saying it undid her pelvic floor repair. Cough is better than with a cold last November. Still daily hacking cough, worse maybe as she lies down. Coughs more in the morning, sometimes with scant mucus. Has pet birds but just went to beach for 2 weeks, with no difference while away. Denies significant postnasal drip. Has noted some reflux , not choking with meals and not waking choked or strangled. Tries to eat earlier before bedtime.  Not using CPAP/BIPAP now at all. Still aspires to weight loss.  06/13/11- 48 yoF former smoker followed for OSA, allergic rhinitis, COPD, complicated by hx of GERD, peripheral venous insufficiency, OHS.  Husband here.  Was  going to try her CPAP again, but has a cold this week. This doesn't explain why she hasn't tried any of the other weeks since she was here last. Going to chiropracter Dr Margaretha Sheffield- brings labs and shows me vitamin D and a complex cream she is using now. He diagnosed her with a variety of deficiencies, insufficiencies and inflammations based on his broad blood panel. CT chest/w cm 05/23/11- no neoplasm or other active disease. Stable mild pulmonary emphysema. We looked at these images together, pointing out the emphysema. I explained this is part of a COPD pattern remaining from her years of cigarette smoking. Found mold on window sills where blinds stayed down. Discussed changing mattress, encasings, aircleaners  Check for a1AT Coughs as she lies down.  Does not recognize either postnasal drip or reflux.  She emphasizes that she "just wants to feel better"  08/05/11- 57 yoF former smoker followed for OSA, allergic rhinitis, COPD, complicated by hx of GERD, peripheral venous insufficiency, OHS. .... husband here   had flu vaccine Persistent cough and she emphasizes that she is tired of it. She is considering pelvic floor repair and is afraid about the cough.  coughs mostly when she lies down, nonproductive, triggered by deep breath. We discussed recent article associating cough with reflux into the lower third of the esophagus but not with microaspiration. I'm not clear if she is smoking anymore, but I think not.  10/06/11- 89 yoF former smoker followed for OSA, allergic rhinitis, COPD, complicated by hx of GERD, peripheral venous insufficiency, OHS. ....    had flu vaccine Has had pneumonia vaccine at least twice. She got over a cold but it took about 3 weeks. Since last here cough is improved to some. Her concern is that it makes her abdomen hurts when she coughs because  of pressure and weakened pelvic floor. He gave her a prescription for tramadol for cough which she did not fill. Activity is limited by  arthritis in her knees but she can do water aerobics. We reviewed her PFT which showed mild obstructive airways disease and small airways with minimal response to bronchodilator. Diffusion 54% of predicted probably reflects her obesity.  12/29/11 64 yoF former smoker followed for OSA, allergic rhinitis, COPD, complicated by hx of GERD, peripheral venous insufficiency, OHS. ....    had flu vaccine Cough has improved so she did not try sample [place]. Uses cough syrup only every 2 weeks. Recently got Singulair refilled. Using acid blocker irregularly. Now on carvedilol, which may be causing vivid dreams and increasing her cough. We discussed the role of beta blocker drugs. . Continues to blame cough for un-doing her pelvic floor repair. She is afraid to try the surgery again unless her cough can be controlled. Still using BiPAP off and on. PFT: 09/01/2011-mild obstructive airways disease with slight response to bronchodilator. Diffusion moderately reduced at least partly due to to her obesity. FEV1/FVC 0.70. Slight response to bronchodilator. DLCO 54%.  04/19/12- 36 yoF former smoker followed for OSA, allergic rhinitis, COPD, complicated by hx of GERD, peripheral venous insufficiency, OHS. ....    had flu vaccine  Has not been using [place]- never tried it; has been overwhelmed with alot of stuff lately; Son moving back home.  Using BiPAP every night-recently got new mask but has not tried it yet. Still considering pelvic reconstructive surgery if cough can be controlled. Increased nasal congestion past 2 or 3 days.  10/18/12-66 yoF former smoker followed for OSA, allergic rhinitis, COPD/ chronic cough,  complicated by hx of GERD, peripheral venous insufficiency, OHS. ....     FOLLOWS FOR: good and bad days-doesn't like to be in cold weather (if rushing then she feels like she has to slow down and take it easy) She asks about nasal saline gel. Chronic cough is worse in cold air. Lying flat brings mild  cough with no sputum. In the morning cough will be productive for a while-white. Morning head congestion. She finds it hard to get up and get started in the morning until her husband brings her coffee. We discussed sleep quality and reviewed our previous discussion about BiPAP. She admits to using Qvar "some". Protonix helps a lot to control her cough if  used daily to suppress heartburn. She needs cough syrup about every 2 weeks.  Review of Systems-see HPI Constitutional:   No-   weight loss, night sweats, fevers, chills,                 + fatigue, lassitude. HEENT:   No-   headaches, difficulty swallowing, tooth/dental problems, sore throat,                  Little-   sneezing, itching, ear ache, +nasal congestion, post nasal drip,  CV:  No-   chest pain, orthopnea, PND, swelling in lower extremities, anasarca, dizziness, palpitations GI:  No-   heartburn, indigestion, abdominal pain, nausea, vomiting, diarrhea,                 change in bowel habits, loss of appetite Resp: No-acute  shortness of breath with exertion or at rest.  No-  excess mucus,             No-  coughing up of blood.  + Persistent dry cough  No-   change in color of mucus.  No- wheezing.   Skin: No-   rash or lesions. GU: No-   dysuria,  MS:  No-acute  joint pain or swelling. Psych:  No- change in mood or affect. No depression or anxiety.  No memory loss.  Objective:   Physical Exam BP 118/76  Pulse 72  Ht 5\' 8"  (1.727 m)  Wt 276 lb 12.8 oz (125.556 kg)  BMI 42.09 kg/m2  SpO2 90% General- Alert, Oriented, Affect-appropriate, Distress- none acute     Obese,  Skin- rash-none, lesions- none, excoriation- none Lymphadenopathy- none Head- atraumatic            Eyes- Gross vision intact, PERRLA, conjunctivae clear secretions            Ears- Hearing, canals normal            Nose- +sounds stuffy, No-Septal dev, mucus, polyps, erosion, perforation             Throat- Mallampati III-IV , mucosa clear ,  drainage- none, tonsils- atrophic. Throat clearing. Neck- flexible , trachea midline, no stridor , thyroid nl, carotid no bruit Chest - symmetrical excursion , unlabored           Heart/CV- RRR , no murmur , no gallop  , no rub, nl s1 s2                           - JVD- none , edema- none, stasis changes- none, varices- none           Lung- +bibasilar crackles, wheeze- none, +occasional mild dry cough; no cough with deep breath , dullness-none, rub- none           Chest wall-  Abd-  Br/ Gen/ Rectal- Not done, not indicated Extrem- cyanosis- none, clubbing, none, atrophy- none, strength- nl.  Superficial varices. Neuro- grossly intact to observation

## 2012-10-18 NOTE — Patient Instructions (Addendum)
Try taking each of your medicines regularly for a week, one at a time, then stop for a week, then restart. See if you can tell that something helps your cough. If not, then ok to stop and stay off.   Script for cough medcine

## 2012-10-25 ENCOUNTER — Ambulatory Visit (INDEPENDENT_AMBULATORY_CARE_PROVIDER_SITE_OTHER): Payer: Medicare Other | Admitting: Internal Medicine

## 2012-10-25 ENCOUNTER — Encounter: Payer: Self-pay | Admitting: Internal Medicine

## 2012-10-25 VITALS — BP 122/88 | HR 72 | Ht 68.0 in | Wt 267.0 lb

## 2012-10-25 DIAGNOSIS — R0602 Shortness of breath: Secondary | ICD-10-CM

## 2012-10-25 DIAGNOSIS — I4891 Unspecified atrial fibrillation: Secondary | ICD-10-CM

## 2012-10-25 LAB — BASIC METABOLIC PANEL
BUN: 18 mg/dL (ref 6–23)
CO2: 33 mEq/L — ABNORMAL HIGH (ref 19–32)
Chloride: 101 mEq/L (ref 96–112)
Creatinine, Ser: 0.8 mg/dL (ref 0.4–1.2)
Glucose, Bld: 96 mg/dL (ref 70–99)

## 2012-10-25 NOTE — Progress Notes (Signed)
HPI Patient is a 66 yo with a history of NICM  LVEF 45 to 50% echo  Also a history of HL, GERD, Obesity, reactive airways. I saw her in Feb Since seen she has had increased complaints of SOB post nasal drip  Worse at night.  Seen by Melba Coon.  Plans to try medical Rx again. Does note some LE edema.  Worse in LLE  Intermitt. No CP Allergies  Allergen Reactions  . Penicillins   . Sulfonamide Derivatives     Current Outpatient Prescriptions  Medication Sig Dispense Refill  . beclomethasone (QVAR) 80 MCG/ACT inhaler Inhale 2 puffs into the lungs 2 (two) times daily. Rinse mouth  1 Inhaler  2  . carvedilol (COREG) 3.125 MG tablet take 1 tablet by mouth twice a day with meals  60 tablet  6  . dextromethorphan-guaiFENesin (MUCINEX DM) 30-600 MG per 12 hr tablet Take 1 tablet by mouth 2 (two) times daily as needed.       . ergocalciferol (VITAMIN D2) 50000 UNITS capsule Take 50,000 Units by mouth once a week.      . fluticasone (FLONASE) 50 MCG/ACT nasal spray INSTILL 2 SPRAYS INTO EACH NOSTRIL ONCE A DAY  16 g  PRN  . HYDROcodone-homatropine (HYDROMET) 5-1.5 MG/5ML syrup Take 5 mLs by mouth every 6 (six) hours as needed for cough.  200 mL  1  . loratadine (CLARITIN) 10 MG tablet Take 10 mg by mouth daily as needed.       Marland Kitchen losartan (COZAAR) 50 MG tablet take 1 tablet by mouth once daily  30 tablet  6  . montelukast (SINGULAIR) 10 MG tablet Take 10 mg by mouth daily as needed.      . NON FORMULARY Clear vite -sf K24       . NON FORMULARY Spleen PMG      . NON FORMULARY adrena calm      . pantoprazole (PROTONIX) 40 MG tablet Take 40 mg by mouth as needed.       . traMADol (ULTRAM) 50 MG tablet Take 1 tablet (50 mg total) by mouth every 6 (six) hours as needed for pain.  30 tablet  3    Past Medical History  Diagnosis Date  . Other primary cardiomyopathies   . Esophageal reflux   . Unspecified venous (peripheral) insufficiency   . Obesity, unspecified   . Arthropathy, unspecified, site  unspecified   . Other acute sinusitis   . Obstructive sleep apnea (adult) (pediatric)   . Arthritis     Past Surgical History  Procedure Date  . Replacement total knee   . Bladder surgery   . Splenectomy   . Pelvic floor repair     Family History  Problem Relation Age of Onset  . Prostate cancer Father     History   Social History  . Marital Status: Married    Spouse Name: N/A    Number of Children: N/A  . Years of Education: N/A   Occupational History  . School teacher    Social History Main Topics  . Smoking status: Former Smoker -- 0.5 packs/day for 10 years    Types: Cigarettes    Quit date: 10/28/1979  . Smokeless tobacco: Never Used  . Alcohol Use: Yes     Comment: Glass of wine per day  . Drug Use: Not on file  . Sexually Active: Not on file   Other Topics Concern  . Not on file   Social History Narrative  .  No narrative on file    Review of Systems:  All systems reviewed.  They are negative to the above problem except as previously stated.  Vital Signs: BP 122/88  Pulse 72  Ht 5\' 8"  (1.727 m)  Wt 267 lb (121.11 kg)  BMI 40.60 kg/m2  SpO2 93%  Physical Exam [place] is in NAD though coughing. HEENT:  Normocephalic, atraumatic. EOMI, PERRLA.  Neck: JVP is normal.  No bruits.  Lungs: clear to auscultation. No rales no wheezes.  Heart: Regular rate and rhythm. Normal S1, S2. No S3.   No significant murmurs. PMI not displaced.  Abdomen:  Supple, nontender. Normal bowel sounds. No masses. No hepatomegaly.  Extremities:   Good distal pulses throughout. No lower extremity edema.  Musculoskeletal :moving all extremities.  Neuro:   alert and oriented x3.  CN II-XII grossly intact.  EKG  SR  76  Nonspcific IVCD Assessment and Plan:  1.  NICM  Volume status looks good overall.  Notes occasional ankle swelling , more on left.  I am not convinced of singif volume overload.  Will check labs.  Keep on same regimen  2.  HL  Lipid have improved since she  began watching what she ate.  Would contione.  3.  GERD  Recomm she try protonix at night as she notes increased coughing when she lies down.  4.  Pulm  Followed by Melba Coon  Recently seen  I have encouraged her to retry medical Rx for cough, postnasal drip as she plans.  Will follow up in fall.

## 2012-10-25 NOTE — Patient Instructions (Addendum)
Lab work today, Your physician wants you to follow-up in: September 2014 You will receive a reminder letter in the mail two months in advance. If you don't receive a letter, please call our office to schedule the follow-up appointment.

## 2012-10-26 ENCOUNTER — Other Ambulatory Visit: Payer: Self-pay | Admitting: *Deleted

## 2012-10-26 DIAGNOSIS — R0602 Shortness of breath: Secondary | ICD-10-CM

## 2012-10-26 MED ORDER — FUROSEMIDE 40 MG PO TABS: ORAL_TABLET | ORAL | Status: DC

## 2012-10-26 MED ORDER — POTASSIUM CHLORIDE ER 10 MEQ PO TBCR: EXTENDED_RELEASE_TABLET | ORAL | Status: DC

## 2012-10-30 NOTE — Assessment & Plan Note (Signed)
She coughs worse if she steps outdoors in current cold weather. Control of reflux has proved important Plan-refill cough syrup. Education.

## 2012-10-30 NOTE — Assessment & Plan Note (Signed)
We discussed postnasal drip, asthma and reflux contributions to her chronic cough Plan-refill cough syrup

## 2012-11-18 ENCOUNTER — Telehealth: Payer: Self-pay | Admitting: Internal Medicine

## 2012-11-18 NOTE — Telephone Encounter (Signed)
lmomtcb x1 

## 2012-11-19 MED ORDER — ACLIDINIUM BROMIDE 400 MCG/ACT IN AEPB: 1.0000 | INHALATION_SPRAY | Freq: Two times a day (BID) | RESPIRATORY_TRACT | Status: DC

## 2012-11-19 NOTE — Telephone Encounter (Signed)
LMTCBx2. I do not see any mention of tudorza sample being given at last OV. Carron Curie, CMA

## 2012-11-19 NOTE — Telephone Encounter (Signed)
Per CY: ok to send refills Tudorza x5.  Called spoke with patient, advised CY okayed for refills on her [place].  Rx sent to verified pharmacy.  Nothing further needed; will sign off.

## 2012-11-19 NOTE — Telephone Encounter (Signed)
Spoke with the pt She is asking for an rx for tudorza I asked if the sample helped and she would never give me a straight forward answer She just stated that she wants to "take anything I can to get rid of cough) Dr Maple Hudson, please advise thanks Last ov 10/18/12 Next ov 04/18/13 Allergies  Allergen Reactions  . Penicillins   . Sulfonamide Derivatives

## 2012-11-19 NOTE — Telephone Encounter (Signed)
Pt returned triage call.  Pt states that she was given a sample of this at her last OV.   Pt is now also requesting a refill on her QVar on [address] on [address]   Antionette Fairy

## 2013-01-22 ENCOUNTER — Other Ambulatory Visit: Payer: Self-pay | Admitting: Internal Medicine

## 2013-03-22 ENCOUNTER — Telehealth: Payer: Self-pay | Admitting: Internal Medicine

## 2013-03-22 MED ORDER — MONTELUKAST SODIUM 10 MG PO TABS: 10.0000 mg | ORAL_TABLET | Freq: Every day | ORAL | Status: DC | PRN

## 2013-03-22 NOTE — Telephone Encounter (Signed)
Rx has been sent in. Pt is aware. 

## 2013-03-23 ENCOUNTER — Other Ambulatory Visit: Payer: Self-pay | Admitting: Internal Medicine

## 2013-04-18 ENCOUNTER — Ambulatory Visit (INDEPENDENT_AMBULATORY_CARE_PROVIDER_SITE_OTHER): Payer: Medicare Other | Admitting: Internal Medicine

## 2013-04-18 ENCOUNTER — Telehealth: Payer: Self-pay | Admitting: Internal Medicine

## 2013-04-18 ENCOUNTER — Encounter: Payer: Self-pay | Admitting: Internal Medicine

## 2013-04-18 VITALS — BP 114/70 | HR 62 | Ht 67.25 in | Wt 263.4 lb

## 2013-04-18 DIAGNOSIS — G4733 Obstructive sleep apnea (adult) (pediatric): Secondary | ICD-10-CM

## 2013-04-18 DIAGNOSIS — R05 Cough: Secondary | ICD-10-CM

## 2013-04-18 NOTE — Patient Instructions (Addendum)
[place] excuse letter Continue efforts at weight loss which will help dyspnea and sleep apnea Continue reflux precautions

## 2013-04-18 NOTE — Progress Notes (Signed)
Patient ID: Crystal Yoder, female    DOB: 1946/08/15, 67 y.o.   MRN: [id number]  HPI 05/13/11- 64 yoF followed for OSA, allergic rhinitis, bronchitis, complicated by hx of GERD, peripheral venous insufficiency, OHS.             Husband here. Last here December 10, 2010- note reviewed C/o cough, saying it undid her pelvic floor repair. Cough is better than with a cold last November. Still daily hacking cough, worse maybe as she lies down. Coughs more in the morning, sometimes with scant mucus. Has pet birds but just went to beach for 2 weeks, with no difference while away. Denies significant postnasal drip. Has noted some reflux , not choking with meals and not waking choked or strangled. Tries to eat earlier before bedtime.  Not using CPAP/BIPAP now at all. Still aspires to weight loss.  06/13/11- 35 yoF former smoker followed for OSA, allergic rhinitis, COPD, complicated by hx of GERD, peripheral venous insufficiency, OHS.  Husband here.  Was going to try her CPAP again, but has a cold this week. This doesn't explain why she hasn't tried any of the other weeks since she was here last. Going to chiropracter Dr Margaretha Sheffield- brings labs and shows me vitamin D and a complex cream she is using now. He diagnosed her with a variety of deficiencies, insufficiencies and inflammations based on his broad blood panel. CT chest/w cm 05/23/11- no neoplasm or other active disease. Stable mild pulmonary emphysema. We looked at these images together, pointing out the emphysema. I explained this is part of a COPD pattern remaining from her years of cigarette smoking. Found mold on window sills where blinds stayed down. Discussed changing mattress, encasings, aircleaners  Check for a1AT Coughs as she lies down.  Does not recognize either postnasal drip or reflux.  She emphasizes that she "just wants to feel better"  08/05/11- 28 yoF former smoker followed for OSA, allergic rhinitis, COPD, complicated by hx of GERD,  peripheral venous insufficiency, OHS. .... husband here   had flu vaccine Persistent cough and she emphasizes that she is tired of it. She is considering pelvic floor repair and is afraid about the cough.  coughs mostly when she lies down, nonproductive, triggered by deep breath. We discussed recent article associating cough with reflux into the lower third of the esophagus but not with microaspiration. I'm not clear if she is smoking anymore, but I think not.  10/06/11- 48 yoF former smoker followed for OSA, allergic rhinitis, COPD, complicated by hx of GERD, peripheral venous insufficiency, OHS. ....    had flu vaccine Has had pneumonia vaccine at least twice. She got over a cold but it took about 3 weeks. Since last here cough is improved to some. Her concern is that it makes her abdomen hurts when she coughs because of pressure and weakened pelvic floor. He gave her a prescription for tramadol for cough which she did not fill. 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 We reviewed her PFT which showed mild obstructive airways disease and small airways with minimal response to bronchodilator. Diffusion 54% of predicted probably reflects her obesity.  12/29/11 64 yoF former smoker followed for OSA, allergic rhinitis, COPD, complicated by hx of GERD, peripheral venous insufficiency, OHS. ....    had flu vaccine Cough has improved so she did not try sample [place]. Uses cough syrup only every 2 weeks. Recently got Singulair refilled. Using acid blocker irregularly. Now  on carvedilol, which may be causing vivid dreams and increasing her cough. We discussed the role of beta blocker drugs. . Continues to blame cough for un-doing her pelvic floor repair. She is afraid to try the surgery again unless her cough can be controlled. Still using BiPAP off and on. PFT: 09/01/2011-mild obstructive airways disease with slight response to bronchodilator. Diffusion moderately reduced at  least partly due to to her obesity. FEV1/FVC 0.70. Slight response to bronchodilator. DLCO 54%.  04/19/12- 19 yoF former smoker followed for OSA, allergic rhinitis, COPD, complicated by hx of GERD, peripheral venous insufficiency, OHS. ....    had flu vaccine  Has not been using [place]- never tried it; has been overwhelmed with alot of stuff lately; Son moving back home.  Using BiPAP every night-recently got new mask but has not tried it yet. Still considering pelvic reconstructive surgery if cough can be controlled. Increased nasal congestion past 2 or 3 days.  10/18/12- 65 yoF former smoker followed for OSA, allergic rhinitis, COPD, complicated by hx of GERD, peripheral venous insufficiency, OHS. ....    had flu vaccine      Review of Systems-see HPI Constitutional:   No-   weight loss, night sweats, fevers, chills,                 + fatigue, lassitude. HEENT:   No-   headaches, difficulty swallowing, tooth/dental problems, sore throat,                  Little-   sneezing, itching, ear ache, +nasal congestion, post nasal drip,  CV:  No-   chest pain, orthopnea, PND, swelling in lower extremities, anasarca, dizziness, palpitations GI:  No-   heartburn, indigestion, abdominal pain, nausea, vomiting, diarrhea,                 change in bowel habits, loss of appetite Resp: No-acute  shortness of breath with exertion or at rest.  No-  excess mucus,             No-  coughing up of blood.  + Persistent dry cough            No-   change in color of mucus.  No- wheezing.   Skin: No-   rash or lesions. GU: No-   dysuria,  MS:  No-acute  joint pain or swelling. Psych:  No- change in mood or affect. No depression or anxiety.  No memory loss.  Objective:   Physical Exam BP 108/80  Pulse 88  Ht 5\' 8"  (1.727 m)  Wt 274 lb 12.8 oz (124.648 kg)  BMI 41.78 kg/m2  SpO2 95%  General- Alert, Oriented, Affect-appropriate, Distress- none acute     Obese,  Skin- rash-none, lesions- none,  excoriation- none Lymphadenopathy- none Head- atraumatic            Eyes- Gross vision intact, PERRLA, conjunctivae clear secretions            Ears- Hearing, canals normal            Nose- Clear, No-Septal dev, mucus, polyps, erosion, perforation             Throat- Mallampati III-IV , mucosa clear , drainage- none, tonsils- atrophic. Throat clearing. Neck- flexible , trachea midline, no stridor , thyroid nl, carotid no bruit Chest - symmetrical excursion , unlabored           Heart/CV- RRR , no murmur , no gallop  , no rub,  nl s1 s2                           - JVD- none , edema- none, stasis changes- none, varices- none           Lung- bibasilar crackles, wheeze- none, occasional dry cough; no cough with deep breath , dullness-none, rub- none           Chest wall-  Abd-  Br/ Gen/ Rectal- Not done, not indicated Extrem- cyanosis- none, clubbing, none, atrophy- none, strength- nl.  Superficial varices. Neuro- grossly intact to observation          Patient ID: Crystal Yoder, female    DOB: 31-May-1946, 67 y.o.   MRN: [id number]  HPI 05/13/11- 64 yoF followed for OSA, allergic rhinitis, bronchitis, complicated by hx of GERD, peripheral venous insufficiency, OHS.             Husband here. Last here December 10, 2010- note reviewed C/o cough, saying it undid her pelvic floor repair. Cough is better than with a cold last November. Still daily hacking cough, worse maybe as she lies down. Coughs more in the morning, sometimes with scant mucus. Has pet birds but just went to beach for 2 weeks, with no difference while away. Denies significant postnasal drip. Has noted some reflux , not choking with meals and not waking choked or strangled. Tries to eat earlier before bedtime.  Not using CPAP/BIPAP now at all. Still aspires to weight loss.  06/13/11- 38 yoF former smoker followed for OSA, allergic rhinitis, COPD, complicated by hx of GERD, peripheral venous insufficiency, OHS.  Husband here.  Was  going to try her CPAP again, but has a cold this week. This doesn't explain why she hasn't tried any of the other weeks since she was here last. Going to chiropracter Dr Margaretha Sheffield- brings labs and shows me vitamin D and a complex cream she is using now. He diagnosed her with a variety of deficiencies, insufficiencies and inflammations based on his broad blood panel. CT chest/w cm 05/23/11- no neoplasm or other active disease. Stable mild pulmonary emphysema. We looked at these images together, pointing out the emphysema. I explained this is part of a COPD pattern remaining from her years of cigarette smoking. Found mold on window sills where blinds stayed down. Discussed changing mattress, encasings, aircleaners  Check for a1AT Coughs as she lies down.  Does not recognize either postnasal drip or reflux.  She emphasizes that she "just wants to feel better"  08/05/11- 45 yoF former smoker followed for OSA, allergic rhinitis, COPD, complicated by hx of GERD, peripheral venous insufficiency, OHS. .... husband here   had flu vaccine Persistent cough and she emphasizes that she is tired of it. She is considering pelvic floor repair and is afraid about the cough.  coughs mostly when she lies down, nonproductive, triggered by deep breath. We discussed recent article associating cough with reflux into the lower third of the esophagus but not with microaspiration. I'm not clear if she is smoking anymore, but I think not.  10/06/11- 40 yoF former smoker followed for OSA, allergic rhinitis, COPD, complicated by hx of GERD, peripheral venous insufficiency, OHS. ....    had flu vaccine Has had pneumonia vaccine at least twice. She got over a cold but it took about 3 weeks. Since last here cough is improved to some. Her concern is that it makes her abdomen hurts when she coughs because  of pressure and weakened pelvic floor. He gave her a prescription for tramadol for cough which she did not fill. Activity is limited by  arthritis in her knees but she can do water aerobics. We reviewed her PFT which showed mild obstructive airways disease and small airways with minimal response to bronchodilator. Diffusion 54% of predicted probably reflects her obesity.  12/29/11 64 yoF former smoker followed for OSA, allergic rhinitis, COPD, complicated by hx of GERD, peripheral venous insufficiency, OHS. ....    had flu vaccine Cough has improved so she did not try sample [place]. Uses cough syrup only every 2 weeks. Recently got Singulair refilled. Using acid blocker irregularly. Now on carvedilol, which may be causing vivid dreams and increasing her cough. We discussed the role of beta blocker drugs. . Continues to blame cough for un-doing her pelvic floor repair. She is afraid to try the surgery again unless her cough can be controlled. Still using BiPAP off and on. PFT: 09/01/2011-mild obstructive airways disease with slight response to bronchodilator. Diffusion moderately reduced at least partly due to to her obesity. FEV1/FVC 0.70. Slight response to bronchodilator. DLCO 54%.  04/19/12- 30 yoF former smoker followed for OSA, allergic rhinitis, COPD, complicated by hx of GERD, peripheral venous insufficiency, OHS. ....    had flu vaccine  Has not been using [place]- never tried it; has been overwhelmed with alot of stuff lately; Son moving back home.  Using BiPAP every night-recently got new mask but has not tried it yet. Still considering pelvic reconstructive surgery if cough can be controlled. Increased nasal congestion past 2 or 3 days.  10/18/12-66 yoF former smoker followed for OSA, allergic rhinitis, COPD/ chronic cough,  complicated by hx of GERD, peripheral venous insufficiency, OHS. ....     FOLLOWS FOR: good and bad days-doesn't like to be in cold weather (if rushing then she feels like she has to slow down and take it easy) She asks about nasal saline gel. Chronic cough is worse in cold air. Lying flat brings mild  cough with no sputum. In the morning cough will be productive for a while-white. Morning head congestion. She finds it hard to get up and get started in the morning until her husband brings her coffee. We discussed sleep quality and reviewed our previous discussion about BiPAP. She admits to using Qvar "some". Protonix helps a lot to control her cough if  used daily to suppress heartburn. She needs cough syrup about every 2 weeks.  04/18/13- 54 yoF former smoker followed for OSA, allergic rhinitis, COPD/ chronic cough,  complicated by hx of GERD, peripheral venous insufficiency, OHS FOLLOWS FOR: currently on a new diet plan-not using inhalers at this time. States cough has gotten better since being on diet. Her main complaint was cough. She credits her chiropractor for providing a weight loss diet and wellness health plan "[facility] Metabolic Diet which she is following for 30 days. He is also providing homeopathic medications so her only prescription medications are her heart meds. She is not using CPAP.  Review of Systems-see HPI Constitutional:   No-   weight loss, night sweats, fevers, chills, + fatigue, lassitude. HEENT:   No-   headaches, difficulty swallowing, tooth/dental problems, sore throat,                  Little-   sneezing, itching, ear ache, +nasal congestion, post nasal drip,  CV:  No-   chest pain, orthopnea, PND, swelling in lower extremities, anasarca, dizziness, palpitations  GI:  No-   heartburn, indigestion, abdominal pain, nausea, vomiting, diarrhea,                 change in bowel habits, loss of appetite Resp: No-acute  shortness of breath with exertion or at rest.  No-  excess mucus,             No-  coughing up of blood.  + Persistent dry cough            No-   change in color of mucus.  No- wheezing.   Skin: No-   rash or lesions. GU: No-   dysuria,  MS:  No-acute  joint pain or swelling. Psych:  No- change in mood or affect. No depression or anxiety.  No memory  loss.  Objective:   Physical Exam  General- Alert, Oriented, Affect-appropriate, Distress- none acute     Obese,  Skin- rash-none, lesions- none, excoriation- none Lymphadenopathy- none Head- atraumatic            Eyes- Gross vision intact, PERRLA, conjunctivae clear secretions            Ears- Hearing, canals normal            Nose- +sounds stuffy, No-Septal dev, mucus, polyps, erosion, perforation             Throat- Mallampati III-IV , mucosa clear , drainage- none, tonsils- atrophic. Throat clearing. Neck- flexible , trachea midline, no stridor , thyroid nl, carotid no bruit Chest - symmetrical excursion , unlabored           Heart/CV- RRR , no murmur , no gallop  , no rub, nl s1 s2                           - JVD- none , edema- none, stasis changes- none, varices- none           Lung- +few crackles right base, wheeze- none, no-cough; no cough with deep breath , dullness-none, rub- none           Chest wall-  Abd-  Br/ Gen/ Rectal- Not done, not indicated Extrem- cyanosis- none, clubbing, none, atrophy- none, strength- nl.  Superficial varices. +dusky/ sunburned feet Neuro- grossly intact to observation

## 2013-04-18 NOTE — Telephone Encounter (Signed)
Message printed and placed on CY's cart with [employer].

## 2013-04-21 ENCOUNTER — Encounter: Payer: Self-pay | Admitting: Internal Medicine

## 2013-04-21 NOTE — Telephone Encounter (Signed)
This has NOT been taken care of yet; he is working on this letter for patient. Will call patient and sign off on message once completed.

## 2013-04-21 NOTE — Telephone Encounter (Signed)
Katie please advise if this has been taken care of and if msg can be closed! Thanks

## 2013-04-22 NOTE — Telephone Encounter (Signed)
Patient is calling back.  [phone]

## 2013-04-25 NOTE — Telephone Encounter (Signed)
Pt aware. She states she is leaving this weekend and will be gone for 1 week. Will forward to [facility]

## 2013-04-25 NOTE — Telephone Encounter (Signed)
Left message for patient on her cell number-need to know if she would like to pick up letter or have it mailed to her home address. Told to speak directly with Katie. Thanks.

## 2013-04-26 NOTE — Telephone Encounter (Signed)
Spoke with patient-she would like the letter and jury duty summons mailed to her home address. This has been done and will sign off on message.

## 2013-05-01 NOTE — Assessment & Plan Note (Signed)
She has failed to tolerate CPAP and has not achieved weight loss so far. 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

## 2013-05-01 NOTE — Assessment & Plan Note (Signed)
Improved

## 2013-05-24 ENCOUNTER — Other Ambulatory Visit: Payer: Self-pay

## 2013-05-24 DIAGNOSIS — Z1231 Encounter for screening mammogram for malignant neoplasm of breast: Secondary | ICD-10-CM

## 2013-06-10 ENCOUNTER — Ambulatory Visit
Admission: RE | Admit: 2013-06-10 | Discharge: 2013-06-10 | Disposition: A | Discharge status: Home / Self Care | Payer: Medicare Other | Source: Ambulatory Visit

## 2013-06-10 DIAGNOSIS — Z1231 Encounter for screening mammogram for malignant neoplasm of breast: Secondary | ICD-10-CM

## 2013-06-22 ENCOUNTER — Other Ambulatory Visit: Payer: Self-pay | Admitting: Dermatology

## 2013-08-15 ENCOUNTER — Other Ambulatory Visit: Payer: Self-pay | Admitting: Dermatology

## 2013-10-11 ENCOUNTER — Telehealth: Payer: Self-pay | Admitting: Internal Medicine

## 2013-10-11 MED ORDER — FLUTICASONE PROPIONATE 50 MCG/ACT NA SUSP: 2.0000 | Freq: Every day | NASAL | Status: DC

## 2013-10-11 NOTE — Telephone Encounter (Signed)
Offer: Tamiflu 75 mg, # 10, 1 twice daily           Prednisone 10 mg# 20, 4 X 2 DAYS, 3 X 2 DAYS, 2 X 2 DAYS, 1 X 2 DAYS  Keep appointment as scheduled

## 2013-10-11 NOTE — Telephone Encounter (Signed)
I spoke with pt. She did not want these medications called in. She requesting QVAR. Per Dr. Maple Hudson he is offering the prednisone to help cover her symptoms until she can get in and be seen to discuss the QVAR since she has not been on this for a while. She did ask if she can have her flonase refilled. Per Dr. Maple Hudson we can refill the flonase and cancel the prednisone if this is what patient wants. I have sent flonase in for pt. Nothing further needed

## 2013-10-11 NOTE — Telephone Encounter (Signed)
I called and spoke with pt. She reports when she is lying down or sleeping she feels like she is "water boarding". C/o prod cough w/ yellow phlem, nasal congestion, PND, chest congestion, increase SOB x Friday. She is taking sudafed, mucinex DM and will finish a ZPAK tomorrow. She is also requesting a refill on flonase (not on pt med list) and QVAR (pt reports has not had this refilled in 5 years). Pt has pending appt 10/13/13 at 10:45. Please advise Dr. Maple Hudson thanks  Allergies  Allergen Reactions  . Penicillins   . Sulfonamide Derivatives

## 2013-10-13 ENCOUNTER — Ambulatory Visit (INDEPENDENT_AMBULATORY_CARE_PROVIDER_SITE_OTHER)
Admission: RE | Admit: 2013-10-13 | Discharge: 2013-10-13 | Disposition: A | Discharge status: Home / Self Care | Payer: Medicare Other | Source: Ambulatory Visit | Attending: Internal Medicine | Admitting: Internal Medicine

## 2013-10-13 ENCOUNTER — Ambulatory Visit (INDEPENDENT_AMBULATORY_CARE_PROVIDER_SITE_OTHER): Payer: Medicare Other | Admitting: Internal Medicine

## 2013-10-13 ENCOUNTER — Encounter: Payer: Self-pay | Admitting: Internal Medicine

## 2013-10-13 VITALS — BP 106/68 | HR 64 | Ht 67.0 in | Wt 267.0 lb

## 2013-10-13 DIAGNOSIS — G4733 Obstructive sleep apnea (adult) (pediatric): Secondary | ICD-10-CM

## 2013-10-13 DIAGNOSIS — J209 Acute bronchitis, unspecified: Secondary | ICD-10-CM

## 2013-10-13 DIAGNOSIS — J018 Other acute sinusitis: Secondary | ICD-10-CM

## 2013-10-13 DIAGNOSIS — J42 Unspecified chronic bronchitis: Secondary | ICD-10-CM

## 2013-10-13 MED ORDER — HYDROCODONE-HOMATROPINE 5-1.5 MG/5ML PO SYRP: 5.0000 mL | ORAL_SOLUTION | Freq: Four times a day (QID) | ORAL | Status: DC | PRN

## 2013-10-13 MED ORDER — BECLOMETHASONE DIPROPIONATE 80 MCG/ACT IN AERS: INHALATION_SPRAY | RESPIRATORY_TRACT | Status: DC

## 2013-10-13 NOTE — Progress Notes (Signed)
Patient ID: Crystal Yoder, female    DOB: 16-Jul-1946, 67 y.o.   MRN: [id number]  HPI 05/13/11- 64 yoF followed for OSA, allergic rhinitis, bronchitis, complicated by hx of GERD, peripheral venous insufficiency, OHS.             Husband here. Last here December 10, 2010- note reviewed C/o cough, saying it undid her pelvic floor repair. Cough is better than with a cold last November. Still daily hacking cough, worse maybe as she lies down. Coughs more in the morning, sometimes with scant mucus. Has pet birds but just went to beach for 2 weeks, with no difference while away. Denies significant postnasal drip. Has noted some reflux , not choking with meals and not waking choked or strangled. Tries to eat earlier before bedtime.  Not using CPAP/BIPAP now at all. Still aspires to weight loss.  06/13/11- 67 yoF former smoker followed for OSA, allergic rhinitis, COPD, complicated by hx of GERD, peripheral venous insufficiency, OHS.  Husband here.  Was going to try her CPAP again, but has a cold this week. This doesn't explain why she hasn't tried any of the other weeks since she was here last. Going to chiropracter Dr Margaretha Sheffield- brings labs and shows me vitamin D and a complex cream she is using now. He diagnosed her with a variety of deficiencies, insufficiencies and inflammations based on his broad blood panel. CT chest/w cm 05/23/11- no neoplasm or other active disease. Stable mild pulmonary emphysema. We looked at these images together, pointing out the emphysema. I explained this is part of a COPD pattern remaining from her years of cigarette smoking. Found mold on window sills where blinds stayed down. Discussed changing mattress, encasings, aircleaners  Check for a1AT Coughs as she lies down.  Does not recognize either postnasal drip or reflux.  She emphasizes that she "just wants to feel better"  08/05/11- 54 yoF former smoker followed for OSA, allergic rhinitis, COPD, complicated by hx of GERD,  peripheral venous insufficiency, OHS. .... husband here   had flu vaccine Persistent cough and she emphasizes that she is tired of it. She is considering pelvic floor repair and is afraid about the cough.  coughs mostly when she lies down, nonproductive, triggered by deep breath. We discussed recent article associating cough with reflux into the lower third of the esophagus but not with microaspiration. I'm not clear if she is smoking anymore, but I think not.  10/06/11- 42 yoF former smoker followed for OSA, allergic rhinitis, COPD, complicated by hx of GERD, peripheral venous insufficiency, OHS. ....    had flu vaccine Has had pneumonia vaccine at least twice. She got over a cold but it took about 3 weeks. Since last here cough is improved to some. Her concern is that it makes her abdomen hurts when she coughs because of pressure and weakened pelvic floor. He gave her a prescription for tramadol for cough which she did not fill. 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 We reviewed her PFT which showed mild obstructive airways disease and small airways with minimal response to bronchodilator. Diffusion 54% of predicted probably reflects her obesity.  12/29/11 64 yoF former smoker followed for OSA, allergic rhinitis, COPD, complicated by hx of GERD, peripheral venous insufficiency, OHS. ....    had flu vaccine Cough has improved so she did not try sample [place]. Uses cough syrup only every 2 weeks. Recently got Singulair refilled. Using acid blocker irregularly. Now  on carvedilol, which may be causing vivid dreams and increasing her cough. We discussed the role of beta blocker drugs. . Continues to blame cough for un-doing her pelvic floor repair. She is afraid to try the surgery again unless her cough can be controlled. Still using BiPAP off and on. PFT: 09/01/2011-mild obstructive airways disease with slight response to bronchodilator. Diffusion moderately reduced at  least partly due to to her obesity. FEV1/FVC 0.70. Slight response to bronchodilator. DLCO 54%.  04/19/12- 73 yoF former smoker followed for OSA, allergic rhinitis, COPD, complicated by hx of GERD, peripheral venous insufficiency, OHS. ....    had flu vaccine  Has not been using [place]- never tried it; has been overwhelmed with alot of stuff lately; Son moving back home.  Using BiPAP every night-recently got new mask but has not tried it yet. Still considering pelvic reconstructive surgery if cough can be controlled. Increased nasal congestion past 2 or 3 days.  10/18/12- 16 yoF former smoker followed for OSA, allergic rhinitis, COPD, complicated by hx of GERD, peripheral venous insufficiency, OHS. ....    had flu vaccine      Review of Systems-see HPI Constitutional:   No-   weight loss, night sweats, fevers, chills,                 + fatigue, lassitude. HEENT:   No-   headaches, difficulty swallowing, tooth/dental problems, sore throat,                  Little-   sneezing, itching, ear ache, +nasal congestion, post nasal drip,  CV:  No-   chest pain, orthopnea, PND, swelling in lower extremities, anasarca, dizziness, palpitations GI:  No-   heartburn, indigestion, abdominal pain, nausea, vomiting, diarrhea,                 change in bowel habits, loss of appetite Resp: No-acute  shortness of breath with exertion or at rest.  No-  excess mucus,             No-  coughing up of blood.  + Persistent dry cough            No-   change in color of mucus.  No- wheezing.   Skin: No-   rash or lesions. GU: No-   dysuria,  MS:  No-acute  joint pain or swelling. Psych:  No- change in mood or affect. No depression or anxiety.  No memory loss.  Objective:   Physical Exam BP 108/80  Pulse 88  Ht 5\' 8"  (1.727 m)  Wt 274 lb 12.8 oz (124.648 kg)  BMI 41.78 kg/m2  SpO2 95%  General- Alert, Oriented, Affect-appropriate, Distress- none acute     Obese,  Skin- rash-none, lesions- none,  excoriation- none Lymphadenopathy- none Head- atraumatic            Eyes- Gross vision intact, PERRLA, conjunctivae clear secretions            Ears- Hearing, canals normal            Nose- Clear, No-Septal dev, mucus, polyps, erosion, perforation             Throat- Mallampati III-IV , mucosa clear , drainage- none, tonsils- atrophic. Throat clearing. Neck- flexible , trachea midline, no stridor , thyroid nl, carotid no bruit Chest - symmetrical excursion , unlabored           Heart/CV- RRR , no murmur , no gallop  , no rub,  nl s1 s2                           - JVD- none , edema- none, stasis changes- none, varices- none           Lung- bibasilar crackles, wheeze- none, occasional dry cough; no cough with deep breath , dullness-none, rub- none           Chest wall-  Abd-  Br/ Gen/ Rectal- Not done, not indicated Extrem- cyanosis- none, clubbing, none, atrophy- none, strength- nl.  Superficial varices. Neuro- grossly intact to observation          Patient ID: Crystal Yoder, female    DOB: September 16, 1946, 67 y.o.   MRN: [id number]  HPI 05/13/11- 64 yoF followed for OSA, allergic rhinitis, bronchitis, complicated by hx of GERD, peripheral venous insufficiency, OHS.             Husband here. Last here December 10, 2010- note reviewed C/o cough, saying it undid her pelvic floor repair. Cough is better than with a cold last November. Still daily hacking cough, worse maybe as she lies down. Coughs more in the morning, sometimes with scant mucus. Has pet birds but just went to beach for 2 weeks, with no difference while away. Denies significant postnasal drip. Has noted some reflux , not choking with meals and not waking choked or strangled. Tries to eat earlier before bedtime.  Not using CPAP/BIPAP now at all. Still aspires to weight loss.  06/13/11- 3 yoF former smoker followed for OSA, allergic rhinitis, COPD, complicated by hx of GERD, peripheral venous insufficiency, OHS.  Husband here.  Was  going to try her CPAP again, but has a cold this week. This doesn't explain why she hasn't tried any of the other weeks since she was here last. Going to chiropracter Dr Margaretha Sheffield- brings labs and shows me vitamin D and a complex cream she is using now. He diagnosed her with a variety of deficiencies, insufficiencies and inflammations based on his broad blood panel. CT chest/w cm 05/23/11- no neoplasm or other active disease. Stable mild pulmonary emphysema. We looked at these images together, pointing out the emphysema. I explained this is part of a COPD pattern remaining from her years of cigarette smoking. Found mold on window sills where blinds stayed down. Discussed changing mattress, encasings, aircleaners  Check for a1AT Coughs as she lies down.  Does not recognize either postnasal drip or reflux.  She emphasizes that she "just wants to feel better"  08/05/11- 50 yoF former smoker followed for OSA, allergic rhinitis, COPD, complicated by hx of GERD, peripheral venous insufficiency, OHS. .... husband here   had flu vaccine Persistent cough and she emphasizes that she is tired of it. She is considering pelvic floor repair and is afraid about the cough.  coughs mostly when she lies down, nonproductive, triggered by deep breath. We discussed recent article associating cough with reflux into the lower third of the esophagus but not with microaspiration. I'm not clear if she is smoking anymore, but I think not.  10/06/11- 81 yoF former smoker followed for OSA, allergic rhinitis, COPD, complicated by hx of GERD, peripheral venous insufficiency, OHS. ....    had flu vaccine Has had pneumonia vaccine at least twice. She got over a cold but it took about 3 weeks. Since last here cough is improved to some. Her concern is that it makes her abdomen hurts when she coughs because  of pressure and weakened pelvic floor. He gave her a prescription for tramadol for cough which she did not fill. Activity is limited by  arthritis in her knees but she can do water aerobics. We reviewed her PFT which showed mild obstructive airways disease and small airways with minimal response to bronchodilator. Diffusion 54% of predicted probably reflects her obesity.  12/29/11 64 yoF former smoker followed for OSA, allergic rhinitis, COPD, complicated by hx of GERD, peripheral venous insufficiency, OHS. ....    had flu vaccine Cough has improved so she did not try sample [place]. Uses cough syrup only every 2 weeks. Recently got Singulair refilled. Using acid blocker irregularly. Now on carvedilol, which may be causing vivid dreams and increasing her cough. We discussed the role of beta blocker drugs. . Continues to blame cough for un-doing her pelvic floor repair. She is afraid to try the surgery again unless her cough can be controlled. Still using BiPAP off and on. PFT: 09/01/2011-mild obstructive airways disease with slight response to bronchodilator. Diffusion moderately reduced at least partly due to to her obesity. FEV1/FVC 0.70. Slight response to bronchodilator. DLCO 54%.  04/19/12- 81 yoF former smoker followed for OSA, allergic rhinitis, COPD, complicated by hx of GERD, peripheral venous insufficiency, OHS. ....    had flu vaccine  Has not been using [place]- never tried it; has been overwhelmed with alot of stuff lately; Son moving back home.  Using BiPAP every night-recently got new mask but has not tried it yet. Still considering pelvic reconstructive surgery if cough can be controlled. Increased nasal congestion past 2 or 3 days.  10/18/12-66 yoF former smoker followed for OSA, allergic rhinitis, COPD/ chronic cough,  complicated by hx of GERD, peripheral venous insufficiency, OHS. ....     FOLLOWS FOR: good and bad days-doesn't like to be in cold weather (if rushing then she feels like she has to slow down and take it easy) She asks about nasal saline gel. Chronic cough is worse in cold air. Lying flat brings mild  cough with no sputum. In the morning cough will be productive for a while-white. Morning head congestion. She finds it hard to get up and get started in the morning until her husband brings her coffee. We discussed sleep quality and reviewed our previous discussion about BiPAP. She admits to using Qvar "some". Protonix helps a lot to control her cough if  used daily to suppress heartburn. She needs cough syrup about every 2 weeks.  04/18/13- 75 yoF former smoker followed for OSA, allergic rhinitis, COPD/ chronic cough,  complicated by hx of GERD, peripheral venous insufficiency, OHS FOLLOWS FOR: currently on a new diet plan-not using inhalers at this time. States cough has gotten better since being on diet. Her main complaint was cough. She credits her chiropractor for providing a weight loss diet and wellness health plan "[facility] which she is following for 30 days. He is also providing homeopathic medications so her only prescription medications are her heart meds. She is not using CPAP.  10/13/13- 67 yoF former smoker followed for OSA, allergic rhinitis, COPD/ chronic cough,  complicated by hx of GERD, peripheral venous insufficiency, OHS  Husband is here (snorting and sniffing, but denies he is sick). Pt c/o congestion in both chest and sinuses, prod cough with yellow/clear mucous X2 wks.   Had just finished a Z-Pak. Hard coughing. Fever blister on chin. No GI upset. Denies myalgias or fever. For the last 2 nights has felt smothered,  lying on one pillow.  Review of Systems-see HPI Constitutional:   No-   weight loss, night sweats, fevers, chills, + fatigue, lassitude. HEENT:   No-   headaches, difficulty swallowing, tooth/dental problems, sore throat,                  Little-   sneezing, itching, ear ache, +nasal congestion, post nasal drip,  CV:  No-   chest pain, orthopnea, PND, swelling in lower extremities, anasarca, dizziness, palpitations GI:  No-   heartburn, indigestion,  abdominal pain, nausea, vomiting, diarrhea,                 change in bowel habits, loss of appetite Resp: + shortness of breath with exertion or at rest.  No-  excess mucus,             No-  coughing up of blood.  + Persistent dry cough           +  change in color of mucus.  No- wheezing.   Skin: No-   rash or lesions. GU: No-   dysuria,  MS:  No-acute  joint pain or swelling. Psych:  No- change in mood or affect. No depression or anxiety.  No memory loss.  Objective:   Physical Exam  General- Alert, Oriented, Affect-appropriate, Distress- none acute     Obese,  Skin- rash-none, lesions- none, excoriation- none Lymphadenopathy- none Head- atraumatic            Eyes- Gross vision intact, PERRLA, conjunctivae clear secretions            Ears- Hearing, canals normal            Nose- +sounds stuffy, No-Septal dev, mucus, polyps, erosion, perforation             Throat- Mallampati III-IV , mucosa clear , drainage- none, tonsils- atrophic. + horse Neck- flexible , trachea midline, no stridor , thyroid nl, carotid no bruit Chest - symmetrical excursion , unlabored           Heart/CV- RRR , no murmur , no gallop  , no rub, nl s1 s2                           - JVD- none , edema- none, stasis changes- none, varices- none           Lung-  Wheeze+ mild, cough+raspy; no cough with deep breath , dullness-none, rub- none           Chest wall-  Abd-  Br/ Gen/ Rectal- Not done, not indicated Extrem- cyanosis- none, clubbing, none, atrophy- none, strength- nl.  Superficial varices. +dusky/ sunburned feet Neuro- grossly intact to observation

## 2013-10-13 NOTE — Patient Instructions (Addendum)
Order- CXR   Dx acute bronchitis   Script for Qvar  Neb neo nasal  Depo 80  Script for cough syrup

## 2013-10-17 ENCOUNTER — Telehealth: Payer: Self-pay | Admitting: Internal Medicine

## 2013-10-17 DIAGNOSIS — J209 Acute bronchitis, unspecified: Secondary | ICD-10-CM | POA: Insufficient documentation

## 2013-10-17 NOTE — Assessment & Plan Note (Signed)
Upper respiratory infection with bronchitis. Plan-Nasal Decongestant nebulizer, Depo-Medrol

## 2013-10-17 NOTE — Telephone Encounter (Signed)
This was documented on wrong patient; Crystal Yoder has fixed this issue/concern to the correct patient.

## 2013-10-17 NOTE — Assessment & Plan Note (Signed)
Nonspecific exacerbation, probably viral. Plan-chest x-ray, Depo-Medrol, cough syrup

## 2013-10-17 NOTE — Telephone Encounter (Signed)
CXR shows chronic enlargement of heart and bronchitis changes with uncertain amount of vascular congestion. I would like her to have labs- D-dimer and B Nat peptide for dx dyspnea.

## 2013-10-17 NOTE — Telephone Encounter (Signed)
Called to advise pt of lab results per CDY.  Pt stated is having some swelling in (R) ankle and leg and some chest pain at times. Should pt be concerned?  Do you have any recs, please advise CDY. Thanks

## 2013-10-17 NOTE — Telephone Encounter (Signed)
Discussed CXR - enlarged heart and probable component of fluid overload plus some chronic bronchitis. She is due for f/u with cardiology/ Crystal Yoder. Crystal Yoder will call there tomorrow for appointment.

## 2013-10-18 NOTE — Telephone Encounter (Signed)
Please make sure she has f/u appt

## 2013-10-31 ENCOUNTER — Ambulatory Visit (INDEPENDENT_AMBULATORY_CARE_PROVIDER_SITE_OTHER): Payer: Medicare Other | Admitting: Internal Medicine

## 2013-10-31 ENCOUNTER — Encounter: Payer: Self-pay | Admitting: Internal Medicine

## 2013-10-31 VITALS — BP 110/64 | HR 66 | Ht 68.0 in | Wt 269.0 lb

## 2013-10-31 DIAGNOSIS — R0602 Shortness of breath: Secondary | ICD-10-CM

## 2013-10-31 DIAGNOSIS — E785 Hyperlipidemia, unspecified: Secondary | ICD-10-CM

## 2013-10-31 DIAGNOSIS — I428 Other cardiomyopathies: Secondary | ICD-10-CM

## 2013-10-31 LAB — CBC WITH DIFFERENTIAL/PLATELET
BASOS PCT: 0.3 % (ref 0.0–3.0)
Basophils Absolute: 0 10*3/uL (ref 0.0–0.1)
Eosinophils Absolute: 0.3 10*3/uL (ref 0.0–0.7)
Eosinophils Relative: 3.2 % (ref 0.0–5.0)
HCT: 43.6 % (ref 36.0–46.0)
HEMOGLOBIN: 14.3 g/dL (ref 12.0–15.0)
LYMPHS ABS: 1.4 10*3/uL (ref 0.7–4.0)
Lymphocytes Relative: 15.7 % (ref 12.0–46.0)
MCHC: 32.9 g/dL (ref 30.0–36.0)
MCV: 94 fl (ref 78.0–100.0)
MONO ABS: 0.8 10*3/uL (ref 0.1–1.0)
MONOS PCT: 8.7 % (ref 3.0–12.0)
NEUTROS ABS: 6.4 10*3/uL (ref 1.4–7.7)
Neutrophils Relative %: 72.1 % (ref 43.0–77.0)
Platelets: 198 10*3/uL (ref 150.0–400.0)
RBC: 4.64 Mil/uL (ref 3.87–5.11)
RDW: 14.4 % (ref 11.5–14.6)
WBC: 8.9 10*3/uL (ref 4.5–10.5)

## 2013-10-31 LAB — BASIC METABOLIC PANEL
BUN: 17 mg/dL (ref 6–23)
CHLORIDE: 102 meq/L (ref 96–112)
CO2: 32 mEq/L (ref 19–32)
Calcium: 9 mg/dL (ref 8.4–10.5)
Creatinine, Ser: 0.8 mg/dL (ref 0.4–1.2)
GFR: 74.67 mL/min (ref >60.00)
Glucose, Bld: 103 mg/dL — ABNORMAL HIGH (ref 70–99)
POTASSIUM: 5 meq/L (ref 3.5–5.1)
Sodium: 140 mEq/L (ref 135–145)

## 2013-10-31 LAB — BRAIN NATRIURETIC PEPTIDE: Pro B Natriuretic peptide (BNP): 153 pg/mL — ABNORMAL HIGH (ref 0.0–100.0)

## 2013-10-31 LAB — LIPID PANEL
CHOL/HDL RATIO: 3
Cholesterol: 201 mg/dL — ABNORMAL HIGH (ref 0–200)
HDL: 67.8 mg/dL (ref >39.00)
TRIGLYCERIDES: 42 mg/dL (ref 0.0–149.0)
VLDL: 8.4 mg/dL (ref 0.0–40.0)

## 2013-10-31 LAB — AST: AST: 20 U/L (ref 0–37)

## 2013-10-31 NOTE — Progress Notes (Addendum)
HPI Patient is a 68 yo with a history of NICM  LVEF 45 to 50% echo  Also a history of HL, GERD, Obesity, reactive airways  I saw the patinet 1 year ago  She ws seen by Melba Coon in December.  Had viral URI at time.  CXR showed mild cardiac enlargement and ? Some vascular congestion The patient says she has been more SOB since infection.  Still coughing.   Notes occasional edema. Allergies  Allergen Reactions  . Penicillins   . Sulfonamide Derivatives     Current Outpatient Prescriptions  Medication Sig Dispense Refill  . beclomethasone (QVAR) 80 MCG/ACT inhaler 2 puffs then rinse mouth well, twice daily  1 Inhaler  prn  . carvedilol (COREG) 3.125 MG tablet take 1 tablet by mouth twice a day with meals  60 tablet  6  . dextromethorphan-guaiFENesin (MUCINEX DM) 30-600 MG per 12 hr tablet Take 1 tablet by mouth 2 (two) times daily as needed.       Marland Kitchen HYDROcodone-homatropine (HYDROMET) 5-1.5 MG/5ML syrup Take 5 mLs by mouth every 6 (six) hours as needed for cough.  200 mL  0  . loratadine (CLARITIN) 10 MG tablet Take 10 mg by mouth daily as needed.       Marland Kitchen losartan (COZAAR) 50 MG tablet take 1 tablet by mouth once daily  30 tablet  6  . montelukast (SINGULAIR) 10 MG tablet Take 1 tablet (10 mg total) by mouth daily as needed.  30 tablet  2  . pantoprazole (PROTONIX) 40 MG tablet Take 40 mg by mouth daily.       . phenylephrine (SUDAFED PE) 10 MG TABS tablet Take 10 mg by mouth every 6 (six) hours as needed.      . Aclidinium Bromide 400 MCG/ACT AEPB Inhale 1 puff into the lungs as needed.      . fluticasone (FLONASE) 50 MCG/ACT nasal spray INSTILL 2 SPRAYS INTO EACH NOSTRIL ONCE A DAY  16 g  PRN   No current facility-administered medications for this visit.    Past Medical History  Diagnosis Date  . Other primary cardiomyopathies   . Esophageal reflux   . Unspecified venous (peripheral) insufficiency   . Obesity, unspecified   . Arthropathy, unspecified, site unspecified   . Other acute  sinusitis   . Obstructive sleep apnea (adult) (pediatric)   . Arthritis     Past Surgical History  Procedure Laterality Date  . Replacement total knee    . Bladder surgery    . Splenectomy    . Pelvic floor repair      Family History  Problem Relation Age of Onset  . Prostate cancer Father     History   Social History  . Marital Status: Married    Spouse Name: N/A    Number of Children: N/A  . Years of Education: N/A   Occupational History  . School teacher    Social History Main Topics  . Smoking status: Former Smoker -- 0.50 packs/day for 10 years    Types: Cigarettes    Quit date: 10/28/1979  . Smokeless tobacco: Never Used  . Alcohol Use: Yes     Comment: Glass of wine per day  . Drug Use: Not on file  . Sexual Activity: Not on file   Other Topics Concern  . Not on file   Social History Narrative  . No narrative on file    Review of Systems:  All systems reviewed.  They are  negative to the above problem except as previously stated.  Vital Signs: BP 110/64  Pulse 66  Ht 5\' 8"  (1.727 m)  Wt 269 lb (122.018 kg)  BMI 40.91 kg/m2  Physical Exam Patinet is in NAD though coughing. HEENT:  Normocephalic, atraumatic. EOMI, PERRLA.  Neck: JVP is normal.  No bruits.  Lungs: clear to auscultation. No rales no wheezes.  Heart: Regular rate and rhythm. Normal S1, S2. No S3.   No significant murmurs. PMI not displaced.  Abdomen:  Supple, nontender. Normal bowel sounds. No masses. No hepatomegaly.  Extremities:   Good distal pulses throughout. Tr lower extremity edema.  Musculoskeletal :moving all extremities.  Neuro:   alert and oriented x3.  CN II-XII grossly intact.  EKG  SR 66.  LAFB   Assessment and Plan:  1.  NICM  LVEF was only mildy depressed on last echo though several years ago. Volume is up a little bit  Would check BMET and BNP as well as CBC Set up for echo  2.  HL  Check lipids    3.  GERD    4.  Pulm  Followed by Melba Coon [place]   Will follow  up in July

## 2013-10-31 NOTE — Patient Instructions (Addendum)
Your physician wants you to follow-up in: 6 MONTHS WITH DR Tenny Craw You will receive a reminder letter in the mail two months in advance. If you don't receive a letter, please call our office to schedule the follow-up appointment.   Your physician recommends that you HAVE LAB WORK TODAY  Your physician has requested that you have an echocardiogram. Echocardiography is a painless test that uses sound waves to create images of your heart. It provides your doctor with information about the size and shape of your heart and how well your heart's chambers and valves are working. This procedure takes approximately one hour. There are no restrictions for this procedure.

## 2013-11-01 LAB — LDL CHOLESTEROL, DIRECT: Direct LDL: 121.5 mg/dL

## 2013-11-03 ENCOUNTER — Other Ambulatory Visit: Payer: Self-pay | Admitting: *Deleted

## 2013-11-03 MED ORDER — FUROSEMIDE 40 MG PO TABS: ORAL_TABLET | ORAL | Status: DC

## 2013-11-07 ENCOUNTER — Ambulatory Visit (HOSPITAL_COMMUNITY): Payer: Medicare Other | Attending: Internal Medicine | Admitting: Cardiology

## 2013-11-07 DIAGNOSIS — R0989 Other specified symptoms and signs involving the circulatory and respiratory systems: Secondary | ICD-10-CM | POA: Insufficient documentation

## 2013-11-07 DIAGNOSIS — Z6841 Body Mass Index (BMI) 40.0 and over, adult: Secondary | ICD-10-CM | POA: Insufficient documentation

## 2013-11-07 DIAGNOSIS — Z87891 Personal history of nicotine dependence: Secondary | ICD-10-CM | POA: Insufficient documentation

## 2013-11-07 DIAGNOSIS — I428 Other cardiomyopathies: Secondary | ICD-10-CM

## 2013-11-07 DIAGNOSIS — E785 Hyperlipidemia, unspecified: Secondary | ICD-10-CM | POA: Insufficient documentation

## 2013-11-07 DIAGNOSIS — R0609 Other forms of dyspnea: Secondary | ICD-10-CM | POA: Insufficient documentation

## 2013-11-07 DIAGNOSIS — E669 Obesity, unspecified: Secondary | ICD-10-CM | POA: Insufficient documentation

## 2013-11-07 DIAGNOSIS — I059 Rheumatic mitral valve disease, unspecified: Secondary | ICD-10-CM | POA: Insufficient documentation

## 2013-11-07 NOTE — Progress Notes (Signed)
Echo performed. 

## 2013-11-17 ENCOUNTER — Telehealth: Payer: Self-pay | Admitting: *Deleted

## 2013-11-17 NOTE — Telephone Encounter (Signed)
Echo is unchanged from 2011  LVEF is 45 to 50%  Made recomm when lab rsults came back  No new recommendations.   11/08/2013 3:46 PM  Pricilla Riffle, MD   pt aware of results

## 2013-12-22 ENCOUNTER — Other Ambulatory Visit: Payer: Self-pay | Admitting: Internal Medicine

## 2014-01-02 ENCOUNTER — Encounter (HOSPITAL_BASED_OUTPATIENT_CLINIC_OR_DEPARTMENT_OTHER): Payer: Medicare Other | Attending: Plastic Surgery

## 2014-01-02 DIAGNOSIS — T25029A Burn of unspecified degree of unspecified foot, initial encounter: Secondary | ICD-10-CM | POA: Insufficient documentation

## 2014-01-02 DIAGNOSIS — X088XXA Exposure to other specified smoke, fire and flames, initial encounter: Secondary | ICD-10-CM | POA: Insufficient documentation

## 2014-01-02 DIAGNOSIS — T310 Burns involving less than 10% of body surface: Secondary | ICD-10-CM | POA: Insufficient documentation
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
## 2014-01-09 NOTE — Progress Notes (Signed)
[facility]  NAME:  Crystal Yoder, Crystal Yoder                ACCOUNT NO.:  [id number]  MEDICAL RECORD NO.:  [id number]      DATE OF BIRTH:  10-30-1945  PHYSICIAN:  Wayland Denis, DO       VISIT DATE:  01/09/2014                                  OFFICE VISIT   SUBJECTIVE:  The patient is a 68 year old female, who is here for followup on her right foot burn.  She has been using Silvadene over the last week and it is actually looking much better portion of the lateral aspect, the proximal aspect, and the most distal aspect has healed.  The portion in between on the dorsal aspect of the foot was full-thickness and that part is looking better, but slow to heal.  She still does not want to go to the operating room.  We did discuss the healing process and rates if we did for an excision and skin graft, but she wants to hold off at least another week.  No change in her antibiotics or social history or review of systems.  OBJECTIVE:  GENERAL:  She is alert, oriented, cooperative, not in any acute distress.  She is pleasant. HEENT:  Pupils are equal.  Extraocular muscles are intact. NECK:  No cervical lymphadenopathy. LUNGS:  Breathing is unlabored. CARDIAC:  Heart rate with her upper extremity pulses are equal.  ASSESSMENT AND PLAN:  She still has severe vascular disease in the lower extremities particularly in the right.  We will continue with elevation, multivitamin, vitamin C, protein, Silvadene, and little bit of Santyl on the deeper burned area.  We will see her back in 1 week.     Wayland Denis, DO     CS/MEDQ  D:  01/09/2014  T:  01/09/2014  Job:  [id number]
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
## 2014-01-20 ENCOUNTER — Encounter: Payer: Self-pay | Admitting: Internal Medicine

## 2014-01-20 ENCOUNTER — Ambulatory Visit (INDEPENDENT_AMBULATORY_CARE_PROVIDER_SITE_OTHER): Payer: Medicare Other | Admitting: Internal Medicine

## 2014-01-20 VITALS — BP 100/78 | HR 70 | Ht 67.0 in | Wt 264.0 lb

## 2014-01-20 DIAGNOSIS — I509 Heart failure, unspecified: Secondary | ICD-10-CM

## 2014-01-20 DIAGNOSIS — I5022 Chronic systolic (congestive) heart failure: Secondary | ICD-10-CM

## 2014-01-20 DIAGNOSIS — E785 Hyperlipidemia, unspecified: Secondary | ICD-10-CM

## 2014-01-20 MED ORDER — HYDROCODONE-ACETAMINOPHEN 5-325 MG PO TABS: 1.0000 | ORAL_TABLET | Freq: Four times a day (QID) | ORAL | Status: DC | PRN
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
## 2014-01-20 NOTE — Patient Instructions (Signed)
Your physician wants you to follow-up in: 6 MONTHS WITH DR ROSS You will receive a reminder letter in the mail two months in advance. If you don't receive a letter, please call our office to schedule the follow-up appointment.  

## 2014-01-23 ENCOUNTER — Encounter (HOSPITAL_BASED_OUTPATIENT_CLINIC_OR_DEPARTMENT_OTHER): Payer: Self-pay | Admitting: *Deleted
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
## 2014-01-23 NOTE — Progress Notes (Signed)
[facility]  NAME:  Crystal Yoder, Crystal Yoder                ACCOUNT NO.:  [id number]  MEDICAL RECORD NO.:  [id number]      DATE OF BIRTH:  1946/06/04  PHYSICIAN:  Wayland Denis, DO       VISIT DATE:  01/23/2014                                  OFFICE VISIT   The patient is a 68 year old female, who is here for followup on her right lower extremity foot burn.  She has been using Silvadene on the area.  There is a little bit of improvement but the eschared area is still present.  We have had a long discussion about her prognosis with and without surgery.  There has been no change in her medications or social history.  On exam, she is alert, oriented, cooperative, not in any acute distress, although a little bit nervous about treatment plan.  Her pupils are equal.  Her breathing is unlabored.  She has used a CPAP one in the past but is not currently and states that she has been sleeping better.  Her heart rate is regular.  The wound is described above.  Recommend continuing with Silvadene, elevation, multivitamins, protein and surgical intervention with excision of the wound and placement of ACell, skin graft and a VAC.  At this point, she is agreeing to it but I am aware that she could change her mind.     Wayland Denis, DO     CS/MEDQ  D:  01/23/2014  T:  01/23/2014  Job:  [id number]

## 2014-01-24 ENCOUNTER — Other Ambulatory Visit: Payer: Self-pay | Admitting: Plastic Surgery

## 2014-01-24 DIAGNOSIS — L97519 Non-pressure chronic ulcer of other part of right foot with unspecified severity: Secondary | ICD-10-CM

## 2014-01-24 NOTE — H&P (Signed)
Crystal Yoder is an 67 y.o. female.   Chief Complaint: right foot ulcer HPI: The patient is a 67-year-old female, who is here for treatment of a right lower extremity foot burn. She has been using Silvadene on the area. There is a little bit of improvement but the eschared area is still present. We have had a long discussion about her prognosis with and without surgery. There has been no change in her medications or social history.  On exam, she is alert, oriented, cooperative, not in any acute distress, although a little bit nervous about treatment plan. Her pupils are equal. Her breathing is unlabored. She has used a CPAP one in the past but is not currently and states that she has been sleeping better. Her heart rate is regular. The wound is described above. Recommend continuing with Silvadene, elevation, multivitamins, protein  and surgical intervention with excision of the wound and placement of ACell, skin graft and a VAC.    Past Medical History  Diagnosis Date  . Other primary cardiomyopathies   . Esophageal reflux   . Unspecified venous (peripheral) insufficiency   . Obesity, unspecified   . Arthropathy, unspecified, site unspecified   . Other acute sinusitis   . Obstructive sleep apnea (adult) (pediatric)   . Arthritis   . Psoriasis   . Shortness of breath   . COPD (chronic obstructive pulmonary disease)     Past Surgical History  Procedure Laterality Date  . Replacement total knee      rt and lt  . Bladder surgery  2011    tac=ant/post  . 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 Pelvic floor repair    . Abdominal hysterectomy  2011    Family History  Problem Relation Age of Onset  . Prostate cancer Father    Social History:  reports that she quit smoking about 34 years ago. Her smoking use included Cigarettes. She has a 5 pack-year smoking history. She has never used smokeless tobacco. She reports that she drinks alcohol. Her drug history is not on  file.  Allergies:  Allergies  Allergen Reactions  . Penicillins Hives  . Sulfonamide Derivatives Nausea And Vomiting     (Not in a hospital admission)  No results found for this or any previous visit (from the past 48 hour(s)). No results found.  Review of Systems  Constitutional: Negative.   HENT: Negative.   Eyes: Negative.   Respiratory: Negative.   Cardiovascular: Negative.   Gastrointestinal: Negative.   Genitourinary: Negative.   Skin: Negative.   Neurological: Negative.   Psychiatric/Behavioral: Negative.     There were no vitals taken for this visit. Physical Exam  Constitutional: She is oriented to person, place, and time. She appears well-developed and well-nourished.  HENT:  Head: Normocephalic and atraumatic.  Eyes: Conjunctivae and EOM are normal. Pupils are equal, round, and reactive to light.  Cardiovascular: Normal rate.   Respiratory: Effort normal.  Musculoskeletal: Normal range of motion.  Neurological: She is alert and oriented to person, place, and time.  Skin: Skin is warm.  Psychiatric: She has a normal mood and affect. Her behavior is normal. Judgment and thought content normal.     Assessment/Plan [name], [name],Crystal Yoder 01/24/2014, 1:30 PM    

## 2014-01-25 ENCOUNTER — Ambulatory Visit (HOSPITAL_BASED_OUTPATIENT_CLINIC_OR_DEPARTMENT_OTHER)
Admission: RE | Admit: 2014-01-25 | Discharge: 2014-01-25 | Disposition: A | Discharge status: Home / Self Care | Payer: Medicare Other | Source: Ambulatory Visit | Attending: Plastic Surgery | Admitting: Plastic Surgery

## 2014-01-25 ENCOUNTER — Encounter (HOSPITAL_BASED_OUTPATIENT_CLINIC_OR_DEPARTMENT_OTHER)
Admission: RE | Disposition: A | Discharge status: Home / Self Care | Payer: Self-pay | Source: Ambulatory Visit | Attending: Plastic Surgery

## 2014-01-25 ENCOUNTER — Encounter (HOSPITAL_BASED_OUTPATIENT_CLINIC_OR_DEPARTMENT_OTHER): Payer: Self-pay | Admitting: Anesthesiology

## 2014-01-25 ENCOUNTER — Ambulatory Visit (HOSPITAL_BASED_OUTPATIENT_CLINIC_OR_DEPARTMENT_OTHER): Payer: Medicare Other | Admitting: Anesthesiology

## 2014-01-25 ENCOUNTER — Encounter (HOSPITAL_BASED_OUTPATIENT_CLINIC_OR_DEPARTMENT_OTHER): Payer: Medicare Other | Admitting: Anesthesiology

## 2014-01-25 DIAGNOSIS — L408 Other psoriasis: Secondary | ICD-10-CM | POA: Insufficient documentation

## 2014-01-25 DIAGNOSIS — J449 Chronic obstructive pulmonary disease, unspecified: Secondary | ICD-10-CM | POA: Insufficient documentation

## 2014-01-25 DIAGNOSIS — K219 Gastro-esophageal reflux disease without esophagitis: Secondary | ICD-10-CM | POA: Insufficient documentation

## 2014-01-25 DIAGNOSIS — T25029A Burn of unspecified degree of unspecified foot, initial encounter: Secondary | ICD-10-CM | POA: Insufficient documentation

## 2014-01-25 DIAGNOSIS — I428 Other cardiomyopathies: Secondary | ICD-10-CM | POA: Insufficient documentation

## 2014-01-25 DIAGNOSIS — J4489 Other specified chronic obstructive pulmonary disease: Secondary | ICD-10-CM | POA: Insufficient documentation

## 2014-01-25 DIAGNOSIS — I872 Venous insufficiency (chronic) (peripheral): Secondary | ICD-10-CM | POA: Insufficient documentation

## 2014-01-25 DIAGNOSIS — X088XXA Exposure to other specified smoke, fire and flames, initial encounter: Secondary | ICD-10-CM | POA: Insufficient documentation

## 2014-01-25 DIAGNOSIS — Z96659 Presence of unspecified artificial knee joint: Secondary | ICD-10-CM | POA: Insufficient documentation

## 2014-01-25 DIAGNOSIS — E669 Obesity, unspecified: Secondary | ICD-10-CM | POA: Insufficient documentation

## 2014-01-25 DIAGNOSIS — Z87891 Personal history of nicotine dependence: Secondary | ICD-10-CM | POA: Insufficient documentation

## 2014-01-25 DIAGNOSIS — L97519 Non-pressure chronic ulcer of other part of right foot with unspecified severity: Secondary | ICD-10-CM

## 2014-01-25 DIAGNOSIS — G4733 Obstructive sleep apnea (adult) (pediatric): Secondary | ICD-10-CM | POA: Insufficient documentation

## 2014-01-25 HISTORY — DX: Shortness of breath: R06.02

## 2014-01-25 HISTORY — DX: Psoriasis, unspecified: L40.9

## 2014-01-25 HISTORY — PX: IRRIGATION AND DEBRIDEMENT OF WOUND WITH SPLIT THICKNESS SKIN GRAFT: SHX5879

## 2014-01-25 HISTORY — DX: Chronic obstructive pulmonary disease, unspecified: J44.9

## 2014-01-25 LAB — POCT I-STAT, CHEM 8
BUN: 27 mg/dL — AB (ref 6–23)
Calcium, Ion: 1.09 mmol/L — ABNORMAL LOW (ref 1.13–1.30)
Chloride: 109 mEq/L (ref 96–112)
Creatinine, Ser: 1 mg/dL (ref 0.50–1.10)
Glucose, Bld: 106 mg/dL — ABNORMAL HIGH (ref 70–99)
HEMATOCRIT: 46 % (ref 36.0–46.0)
Hemoglobin: 15.6 g/dL — ABNORMAL HIGH (ref 12.0–15.0)
POTASSIUM: 4.6 meq/L (ref 3.7–5.3)
SODIUM: 142 meq/L (ref 137–147)
TCO2: 24 mmol/L (ref 0–100)

## 2014-01-25 SURGERY — IRRIGATION AND DEBRIDEMENT OF WOUND WITH SPLIT THICKNESS SKIN GRAFT
Anesthesia: General | Site: Foot | Laterality: Right

## 2014-01-25 MED ORDER — PROPOFOL 10 MG/ML IV BOLUS
INTRAVENOUS | Status: AC
  Filled 2014-01-25: qty 60

## 2014-01-25 MED ORDER — FENTANYL CITRATE 0.05 MG/ML IJ SOLN
INTRAMUSCULAR | Status: AC
  Filled 2014-01-25: qty 6

## 2014-01-25 MED ORDER — MIDAZOLAM HCL 2 MG/2ML IJ SOLN: 1.0000 mg | INTRAMUSCULAR | Status: DC | PRN

## 2014-01-25 MED ORDER — FENTANYL CITRATE 0.05 MG/ML IJ SOLN
INTRAMUSCULAR | Status: DC | PRN
  Administered 2014-01-25 (×4): 50 ug via INTRAVENOUS
  Administered 2014-01-25: 100 ug via INTRAVENOUS

## 2014-01-25 MED ORDER — LIDOCAINE-EPINEPHRINE 1 %-1:100000 IJ SOLN
INTRAMUSCULAR | Status: AC
  Filled 2014-01-25: qty 1

## 2014-01-25 MED ORDER — DEXAMETHASONE SODIUM PHOSPHATE 4 MG/ML IJ SOLN
INTRAMUSCULAR | Status: DC | PRN
  Administered 2014-01-25: 10 mg via INTRAVENOUS

## 2014-01-25 MED ORDER — BACITRACIN ZINC 500 UNIT/GM EX OINT
TOPICAL_OINTMENT | CUTANEOUS | Status: AC
  Filled 2014-01-25: qty 28.35

## 2014-01-25 MED ORDER — OXYCODONE HCL 5 MG PO TABS
ORAL_TABLET | ORAL | Status: AC
  Filled 2014-01-25: qty 1

## 2014-01-25 MED ORDER — PROPOFOL 10 MG/ML IV BOLUS
INTRAVENOUS | Status: DC | PRN
  Administered 2014-01-25: 150 mg via INTRAVENOUS

## 2014-01-25 MED ORDER — BUPIVACAINE HCL (PF) 0.25 % IJ SOLN
INTRAMUSCULAR | Status: DC | PRN
  Administered 2014-01-25: 8 mL

## 2014-01-25 MED ORDER — FENTANYL CITRATE 0.05 MG/ML IJ SOLN: 50.0000 ug | INTRAMUSCULAR | Status: DC | PRN

## 2014-01-25 MED ORDER — OXYCODONE HCL 5 MG/5ML PO SOLN: 5.0000 mg | Freq: Once | ORAL | Status: DC | PRN

## 2014-01-25 MED ORDER — SODIUM CHLORIDE 0.9 % IR SOLN
Status: DC | PRN
  Administered 2014-01-25: 14:00:00

## 2014-01-25 MED ORDER — LIDOCAINE HCL (CARDIAC) 20 MG/ML IV SOLN
INTRAVENOUS | Status: DC | PRN
  Administered 2014-01-25: 50 mg via INTRAVENOUS

## 2014-01-25 MED ORDER — BUPIVACAINE-EPINEPHRINE PF 0.25-1:200000 % IJ SOLN
INTRAMUSCULAR | Status: AC
  Filled 2014-01-25: qty 30

## 2014-01-25 MED ORDER — OXYCODONE HCL 5 MG PO TABS
5.0000 mg | ORAL_TABLET | Freq: Once | ORAL | Status: DC | PRN
  Administered 2014-01-25: 5 mg via ORAL

## 2014-01-25 MED ORDER — BUPIVACAINE HCL (PF) 0.25 % IJ SOLN
INTRAMUSCULAR | Status: AC
Start: 2014-01-25 — End: 2014-01-25
  Filled 2014-01-25: qty 30

## 2014-01-25 MED ORDER — MIDAZOLAM HCL 5 MG/5ML IJ SOLN
INTRAMUSCULAR | Status: DC | PRN
  Administered 2014-01-25: 2 mg via INTRAVENOUS

## 2014-01-25 MED ORDER — MIDAZOLAM HCL 2 MG/2ML IJ SOLN
INTRAMUSCULAR | Status: AC
  Filled 2014-01-25: qty 2

## 2014-01-25 MED ORDER — LACTATED RINGERS IV SOLN
INTRAVENOUS | Status: DC
  Administered 2014-01-25: 12:00:00 via INTRAVENOUS

## 2014-01-25 MED ORDER — PROPOFOL 10 MG/ML IV EMUL
INTRAVENOUS | Status: AC
  Filled 2014-01-25: qty 50

## 2014-01-25 MED ORDER — ONDANSETRON HCL 4 MG/2ML IJ SOLN
INTRAMUSCULAR | Status: DC | PRN
Start: 2014-01-25 — End: 2014-01-25
  Administered 2014-01-25: 4 mg via INTRAVENOUS

## 2014-01-25 MED ORDER — HYDROMORPHONE HCL PF 1 MG/ML IJ SOLN: 0.2500 mg | INTRAMUSCULAR | Status: DC | PRN

## 2014-01-25 MED ORDER — CIPROFLOXACIN IN D5W 400 MG/200ML IV SOLN
400.0000 mg | INTRAVENOUS | Status: AC
  Administered 2014-01-25 (×2): 400 mg via INTRAVENOUS

## 2014-01-25 MED ORDER — CIPROFLOXACIN IN D5W 400 MG/200ML IV SOLN
INTRAVENOUS | Status: AC
Start: 2014-01-25 — End: 2014-01-25
  Filled 2014-01-25: qty 200

## 2014-01-25 SURGICAL SUPPLY — 76 items
BAG DECANTER FOR FLEXI CONT (MISCELLANEOUS) IMPLANT
BANDAGE ELASTIC 3 VELCRO ST LF (GAUZE/BANDAGES/DRESSINGS) IMPLANT
BANDAGE ELASTIC 4 VELCRO ST LF (GAUZE/BANDAGES/DRESSINGS) ×3 IMPLANT
BANDAGE ELASTIC 6 VELCRO ST LF (GAUZE/BANDAGES/DRESSINGS) ×3 IMPLANT
BENZOIN TINCTURE PRP APPL 2/3 (GAUZE/BANDAGES/DRESSINGS) IMPLANT
BLADE DERMATOME SS (BLADE) ×3 IMPLANT
BLADE HEX COATED 2.75 (ELECTRODE) ×3 IMPLANT
BLADE SURG 10 STRL SS (BLADE) ×3 IMPLANT
BLADE SURG 15 STRL LF DISP TIS (BLADE) ×1 IMPLANT
BLADE SURG 15 STRL SS (BLADE) ×2
BLADE SURG ROTATE 9660 (MISCELLANEOUS) IMPLANT
BNDG COHESIVE 4X5 TAN STRL (GAUZE/BANDAGES/DRESSINGS) IMPLANT
BNDG GAUZE ELAST 4 BULKY (GAUZE/BANDAGES/DRESSINGS) ×3 IMPLANT
BURN MATRIX MATRISTEM 10X15 (Tissue) ×3 IMPLANT
CLOSURE WOUND 1/2 X4 (GAUZE/BANDAGES/DRESSINGS)
COTTONBALL LRG STERILE PKG (GAUZE/BANDAGES/DRESSINGS) ×6 IMPLANT
COVER MAYO STAND STRL (DRAPES) ×3 IMPLANT
COVER TABLE BACK 60X90 (DRAPES) ×3 IMPLANT
DECANTER SPIKE VIAL GLASS SM (MISCELLANEOUS) IMPLANT
DERMABOND ADVANCED (GAUZE/BANDAGES/DRESSINGS)
DERMABOND ADVANCED .7 DNX12 (GAUZE/BANDAGES/DRESSINGS) IMPLANT
DERMACARRIERS GRAFT 1 TO 1.5 (DISPOSABLE)
DRAPE EXTREMITY T 121X128X90 (DRAPE) IMPLANT
DRAPE INCISE IOBAN 66X45 STRL (DRAPES) ×3 IMPLANT
DRAPE PED LAPAROTOMY (DRAPES) IMPLANT
DRAPE SURG 17X23 STRL (DRAPES) ×12 IMPLANT
DRAPE U-SHAPE 76X120 STRL (DRAPES) ×3 IMPLANT
DRSG ADAPTIC 3X8 NADH LF (GAUZE/BANDAGES/DRESSINGS) ×6 IMPLANT
DRSG EMULSION OIL 3X3 NADH (GAUZE/BANDAGES/DRESSINGS) IMPLANT
DRSG OPSITE 6X11 MED (GAUZE/BANDAGES/DRESSINGS) IMPLANT
DRSG PAD ABDOMINAL 8X10 ST (GAUZE/BANDAGES/DRESSINGS) IMPLANT
DRSG TEGADERM 4X10 (GAUZE/BANDAGES/DRESSINGS) IMPLANT
ELECT REM PT RETURN 9FT ADLT (ELECTROSURGICAL)
ELECTRODE REM PT RTRN 9FT ADLT (ELECTROSURGICAL) IMPLANT
GAUZE SPONGE 4X4 16PLY XRAY LF (GAUZE/BANDAGES/DRESSINGS) IMPLANT
GLOVE BIO SURGEON STRL SZ 6.5 (GLOVE) ×8 IMPLANT
GLOVE BIO SURGEON STRL SZ8.5 (GLOVE) ×3 IMPLANT
GLOVE BIO SURGEONS STRL SZ 6.5 (GLOVE) ×4
GOWN STRL REUS W/ TWL LRG LVL3 (GOWN DISPOSABLE) ×2 IMPLANT
GOWN STRL REUS W/TWL LRG LVL3 (GOWN DISPOSABLE) ×4
GRAFT DERMACARRIERS 1 TO 1.5 (DISPOSABLE) IMPLANT
MICROMATRIX 500MG (Tissue) ×3 IMPLANT
NEEDLE 27GAX1X1/2 (NEEDLE) ×3 IMPLANT
NS IRRIG 1000ML POUR BTL (IV SOLUTION) ×3 IMPLANT
PACK BASIN DAY SURGERY FS (CUSTOM PROCEDURE TRAY) ×3 IMPLANT
PAD CAST 3X4 CTTN HI CHSV (CAST SUPPLIES) IMPLANT
PAD CAST 4YDX4 CTTN HI CHSV (CAST SUPPLIES) IMPLANT
PADDING CAST ABS 4INX4YD NS (CAST SUPPLIES)
PADDING CAST ABS COTTON 4X4 ST (CAST SUPPLIES) IMPLANT
PADDING CAST COTTON 3X4 STRL (CAST SUPPLIES)
PADDING CAST COTTON 4X4 STRL (CAST SUPPLIES)
PENCIL BUTTON HOLSTER BLD 10FT (ELECTRODE) IMPLANT
SHEET MEDIUM DRAPE 40X70 STRL (DRAPES) ×3 IMPLANT
SOLUTION PARTIC MCRMTRX 500MG (Tissue) ×1 IMPLANT
SPONGE GAUZE 4X4 12PLY (GAUZE/BANDAGES/DRESSINGS) ×6 IMPLANT
SPONGE LAP 18X18 X RAY DECT (DISPOSABLE) ×3 IMPLANT
STAPLER VISISTAT 35W (STAPLE) IMPLANT
STOCKINETTE 4X48 STRL (DRAPES) IMPLANT
STOCKINETTE 6  STRL (DRAPES)
STOCKINETTE 6 STRL (DRAPES) IMPLANT
STOCKINETTE IMPERVIOUS LG (DRAPES) IMPLANT
STRIP CLOSURE SKIN 1/2X4 (GAUZE/BANDAGES/DRESSINGS) IMPLANT
SURGILUBE 2OZ TUBE FLIPTOP (MISCELLANEOUS) IMPLANT
SUT MON AB 5-0 PS2 18 (SUTURE) IMPLANT
SUT SILK 3 0 SH CR/8 (SUTURE) IMPLANT
SUT SILK 4 0 SH CR/8 (SUTURE) IMPLANT
SUT VIC AB 3-0 FS2 27 (SUTURE) IMPLANT
SUT VIC AB 5-0 P-3 18X BRD (SUTURE) IMPLANT
SUT VIC AB 5-0 P3 18 (SUTURE)
SUT VIC AB 5-0 PS2 18 (SUTURE) ×9 IMPLANT
SUT VICRYL 4-0 PS2 18IN ABS (SUTURE) IMPLANT
SYR BULB 3OZ (MISCELLANEOUS) ×3 IMPLANT
SYR CONTROL 10ML LL (SYRINGE) ×3 IMPLANT
TOWEL OR 17X24 6PK STRL BLUE (TOWEL DISPOSABLE) ×3 IMPLANT
TRAY DSU PREP LF (CUSTOM PROCEDURE TRAY) ×3 IMPLANT
UNDERPAD 30X30 INCONTINENT (UNDERPADS AND DIAPERS) IMPLANT
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
## 2014-01-25 NOTE — Anesthesia Procedure Notes (Signed)
Procedure Name: LMA Insertion Date/Time: 01/25/2014 1:57 PM Performed by: Zenia Resides D Pre-anesthesia Checklist: Patient identified, Emergency Drugs available, Suction available and Patient being monitored Patient Re-evaluated:Patient Re-evaluated prior to inductionOxygen Delivery Method: Circle System Utilized Preoxygenation: Pre-oxygenation with 100% oxygen Intubation Type: IV induction Ventilation: Mask ventilation without difficulty LMA: LMA inserted LMA Size: 4.0 Number of attempts: 1 Airway Equipment and Method: bite block Placement Confirmation: positive ETCO2 Tube secured with: Tape Dental Injury: Teeth and Oropharynx as per pre-operative assessment

## 2014-01-25 NOTE — H&P (View-Only) (Signed)
Crystal Yoder is an 68 y.o. female.   Chief Complaint: right foot ulcer HPI: The patient is a 68 year old female, who is here for treatment of a right lower extremity foot burn. She has been using Silvadene on the area. There is a little bit of improvement but the eschared area is still present. We have had a long discussion about her prognosis with and without surgery. There has been no change in her medications or social history.  On exam, she is alert, oriented, cooperative, not in any acute distress, although a little bit nervous about treatment plan. Her pupils are equal. Her breathing is unlabored. She has used a CPAP one in the past but is not currently and states that she has been sleeping better. Her heart rate is regular. The wound is described above. Recommend continuing with Silvadene, elevation, multivitamins, protein  and surgical intervention with excision of the wound and placement of ACell, skin graft and a VAC.    Past Medical History  Diagnosis Date  . Other primary cardiomyopathies   . Esophageal reflux   . Unspecified venous (peripheral) insufficiency   . Obesity, unspecified   . Arthropathy, unspecified, site unspecified   . Other acute sinusitis   . Obstructive sleep apnea (adult) (pediatric)   . Arthritis   . Psoriasis   . Shortness of breath   . COPD (chronic obstructive pulmonary disease)     Past Surgical History  Procedure Laterality Date  . Replacement total knee      rt and lt  . Bladder surgery  2011    tac=ant/post  . 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 Pelvic floor repair    . Abdominal hysterectomy  2011    Family History  Problem Relation Age of Onset  . Prostate cancer Father    Social History:  reports that she quit smoking about 34 years ago. Her smoking use included Cigarettes. She has a 5 pack-year smoking history. She has never used smokeless tobacco. She reports that she drinks alcohol. Her drug history is not on  file.  Allergies:  Allergies  Allergen Reactions  . Penicillins Hives  . Sulfonamide Derivatives Nausea And Vomiting     (Not in a hospital admission)  No results found for this or any previous visit (from the past 48 hour(s)). No results found.  Review of Systems  Constitutional: Negative.   HENT: Negative.   Eyes: Negative.   Respiratory: Negative.   Cardiovascular: Negative.   Gastrointestinal: Negative.   Genitourinary: Negative.   Skin: Negative.   Neurological: Negative.   Psychiatric/Behavioral: Negative.     There were no vitals taken for this visit. Physical Exam  Constitutional: She is oriented to person, place, and time. She appears well-developed and well-nourished.  HENT:  Head: Normocephalic and atraumatic.  Eyes: Conjunctivae and EOM are normal. Pupils are equal, round, and reactive to light.  Cardiovascular: Normal rate.   Respiratory: Effort normal.  Musculoskeletal: Normal range of motion.  Neurological: She is alert and oriented to person, place, and time.  Skin: Skin is warm.  Psychiatric: She has a normal mood and affect. Her behavior is normal. Judgment and thought content normal.     Assessment/Plan [name], skin graft and the VAC.  Crystal Yoder,Crystal Yoder 01/24/2014, 1:30 PM
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
## 2014-01-25 NOTE — Discharge Instructions (Signed)
Keep vac on and do not remove   Post Anesthesia Home Care Instructions  Activity: Get plenty of rest for the remainder of the day. A responsible adult should stay with you for 24 hours following the procedure.  For the next 24 hours, DO NOT: -Drive a car -Advertising copywriter -Drink alcoholic beverages -Take any medication unless instructed by your physician -Make any legal decisions or sign important papers.  Meals: Start with liquid foods such as gelatin or soup. Progress to regular foods as tolerated. Avoid greasy, spicy, heavy foods. If nausea and/or vomiting occur, drink only clear liquids until the nausea and/or vomiting subsides. Call your physician if vomiting continues.  Special Instructions/Symptoms: Your throat may feel dry or sore from the anesthesia or the breathing tube placed in your throat during surgery. If this causes discomfort, gargle with warm salt water. The discomfort should disappear within 24 hours. Regional Anesthesia Blocks  1. Numbness or the inability to move the "blocked" extremity may last from 3-48 hours after placement. The length of time depends on the medication injected and your individual response to the medication. If the numbness is not going away after 48 hours, call your surgeon.  2. The extremity that is blocked will need to be protected until the numbness is gone and the  Strength has returned. Because you cannot feel it, you will need to take extra care to avoid injury. Because it may be weak, you may have difficulty moving it or using it. You may not know what position it is in without looking at it while the block is in effect.  3. For blocks in the legs and feet, returning to weight bearing and walking needs to be done carefully. You will need to wait until the numbness is entirely gone and the strength has returned. You should be able to move your leg and foot normally before you try and bear weight or walk. You will need someone to be with you  when you first try to ensure you do not fall and possibly risk injury.  4. Bruising and tenderness at the needle site are common side effects and will resolve in a few days.  5. Persistent numbness or new problems with movement should be communicated to the surgeon or the [facility] [phone] [facility] [phone]).

## 2014-01-25 NOTE — Anesthesia Preprocedure Evaluation (Signed)
Anesthesia Evaluation  Patient identified by MRN, date of birth, ID band Patient awake    Reviewed: Allergy & Precautions, H&P , NPO status , Patient's Chart, lab work & pertinent test results, reviewed documented beta blocker date and time   Airway Mallampati: III TM Distance: >3 FB Neck ROM: Full    Dental no notable dental hx. (+) Teeth Intact, Dental Advisory Given   Pulmonary neg pulmonary ROS, shortness of breath, sleep apnea , COPDformer smoker,  breath sounds clear to auscultation  Pulmonary exam normal       Cardiovascular On Medications and On Home Beta Blockers Rhythm:Regular Rate:Normal  Cardiomyopathy   Neuro/Psych negative neurological ROS  negative psych ROS   GI/Hepatic negative GI ROS, Neg liver ROS, GERD-  Medicated and Controlled,  Endo/Other  negative endocrine ROS  Renal/GU negative Renal ROS  negative genitourinary   Musculoskeletal   Abdominal   Peds  Hematology negative hematology ROS (+)   Anesthesia Other Findings   Reproductive/Obstetrics negative OB ROS                           Anesthesia Physical Anesthesia Plan  ASA: III  Anesthesia Plan: General   Post-op Pain Management:    Induction: Intravenous  Airway Management Planned: LMA  Additional Equipment:   Intra-op Plan:   Post-operative Plan: Extubation in OR  Informed Consent: I have reviewed the patients History and Physical, chart, labs and discussed the procedure including the risks, benefits and alternatives for the proposed anesthesia with the patient or authorized representative who has indicated his/her understanding and acceptance.   Dental advisory given  Plan Discussed with: CRNA  Anesthesia Plan Comments:         Anesthesia Quick Evaluation

## 2014-01-25 NOTE — Anesthesia Postprocedure Evaluation (Signed)
  Anesthesia Post-op Note  Patient: Crystal Yoder  Procedure(s) Performed: Procedure(s): RIGHT FOOT IRRIGATION AND DEBRIDEMENT WITH ACELL/SPLICK THICKNESS SKINGRAFT WITH VAC (Right)  Patient Location: PACU  Anesthesia Type:General  Level of Consciousness: awake and alert   Airway and Oxygen Therapy: Patient Spontanous Breathing  Post-op Pain: mild  Post-op Assessment: Post-op Vital signs reviewed, Patient's Cardiovascular Status Stable and Respiratory Function Stable  Post-op Vital Signs: Reviewed  Filed Vitals:   01/25/14 1545  BP: 110/80  Pulse: 66  Temp:   Resp: 15    Complications: No apparent anesthesia complications

## 2014-01-25 NOTE — Transfer of Care (Signed)
Immediate Anesthesia Transfer of Care Note  Patient: Crystal Yoder  Procedure(s) Performed: Procedure(s): RIGHT FOOT IRRIGATION AND DEBRIDEMENT WITH ACELL/SPLICK THICKNESS SKINGRAFT WITH VAC (Right)  Patient Location: PACU  Anesthesia Type:General  Level of Consciousness: awake and alert   Airway & Oxygen Therapy: Patient Spontanous Breathing and Patient connected to face mask oxygen  Post-op Assessment: Report given to PACU RN and Post -op Vital signs reviewed and stable  Post vital signs: Reviewed and stable  Complications: No apparent anesthesia complications
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
## 2014-01-26 ENCOUNTER — Encounter (HOSPITAL_BASED_OUTPATIENT_CLINIC_OR_DEPARTMENT_OTHER): Payer: Medicare Other | Attending: Plastic Surgery

## 2014-01-26 DIAGNOSIS — X088XXA Exposure to other specified smoke, fire and flames, initial encounter: Secondary | ICD-10-CM | POA: Insufficient documentation

## 2014-01-26 DIAGNOSIS — L97509 Non-pressure chronic ulcer of other part of unspecified foot with unspecified severity: Secondary | ICD-10-CM | POA: Insufficient documentation

## 2014-01-26 DIAGNOSIS — T25029A Burn of unspecified degree of unspecified foot, initial encounter: Secondary | ICD-10-CM | POA: Insufficient documentation

## 2014-01-30 ENCOUNTER — Encounter (HOSPITAL_BASED_OUTPATIENT_CLINIC_OR_DEPARTMENT_OTHER): Payer: Self-pay | Admitting: Plastic Surgery

## 2014-01-30 ENCOUNTER — Encounter (HOSPITAL_BASED_OUTPATIENT_CLINIC_OR_DEPARTMENT_OTHER): Payer: Medicare Other
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
## 2014-02-17 ENCOUNTER — Ambulatory Visit: Payer: Medicare Other | Admitting: Internal Medicine
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
## 2014-02-27 ENCOUNTER — Encounter (HOSPITAL_BASED_OUTPATIENT_CLINIC_OR_DEPARTMENT_OTHER): Payer: Medicare Other | Attending: Plastic Surgery

## 2014-02-27 DIAGNOSIS — L97509 Non-pressure chronic ulcer of other part of unspecified foot with unspecified severity: Secondary | ICD-10-CM | POA: Insufficient documentation

## 2014-02-27 DIAGNOSIS — I872 Venous insufficiency (chronic) (peripheral): Secondary | ICD-10-CM | POA: Diagnosis present

## 2014-03-13 ENCOUNTER — Encounter (HOSPITAL_BASED_OUTPATIENT_CLINIC_OR_DEPARTMENT_OTHER): Payer: PRIVATE HEALTH INSURANCE

## 2014-03-13 DIAGNOSIS — I872 Venous insufficiency (chronic) (peripheral): Secondary | ICD-10-CM | POA: Diagnosis not present

## 2014-03-13 NOTE — Progress Notes (Signed)
[facility]  NAME:  Crystal Yoder, Crystal Yoder                ACCOUNT NO.:  [id number]  MEDICAL RECORD NO.:  [id number]      DATE OF BIRTH:  December 30, 1945  PHYSICIAN:  Wayland Denis, DO       VISIT DATE:  03/13/2014                                  OFFICE VISIT   The patient is a 68 year old female who is here for followup on her right foot ulcer secondary to trauma.  She does have chronic venous insufficiency.  She is doing extremely well.  At this point, she has had about 90% take.  There is a small area that is still open.  She has been using collagen with great improvement.  I put Endoform on it today.  She has plenty for dressing changes and we will see her back in 2 weeks.     Wayland Denis, DO     CS/MEDQ  D:  03/13/2014  T:  03/13/2014  Job:  [id number]

## 2014-03-27 ENCOUNTER — Encounter (HOSPITAL_BASED_OUTPATIENT_CLINIC_OR_DEPARTMENT_OTHER): Payer: Medicare Other | Attending: Plastic Surgery

## 2014-03-27 DIAGNOSIS — T25029A Burn of unspecified degree of unspecified foot, initial encounter: Secondary | ICD-10-CM | POA: Insufficient documentation

## 2014-03-27 DIAGNOSIS — L97509 Non-pressure chronic ulcer of other part of unspecified foot with unspecified severity: Secondary | ICD-10-CM | POA: Insufficient documentation

## 2014-03-27 DIAGNOSIS — X19XXXA Contact with other heat and hot substances, initial encounter: Secondary | ICD-10-CM | POA: Insufficient documentation

## 2014-04-10 DIAGNOSIS — L97509 Non-pressure chronic ulcer of other part of unspecified foot with unspecified severity: Secondary | ICD-10-CM | POA: Diagnosis not present
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
## 2014-04-11 ENCOUNTER — Other Ambulatory Visit: Payer: Self-pay | Admitting: Internal Medicine

## 2014-05-11 ENCOUNTER — Other Ambulatory Visit: Payer: Self-pay

## 2014-05-11 DIAGNOSIS — Z1231 Encounter for screening mammogram for malignant neoplasm of breast: Secondary | ICD-10-CM

## 2014-06-12 ENCOUNTER — Ambulatory Visit: Payer: PRIVATE HEALTH INSURANCE

## 2014-06-19 ENCOUNTER — Ambulatory Visit
Admission: RE | Admit: 2014-06-19 | Discharge: 2014-06-19 | Disposition: A | Discharge status: Home / Self Care | Payer: Medicare Other | Source: Ambulatory Visit

## 2014-06-19 DIAGNOSIS — Z1231 Encounter for screening mammogram for malignant neoplasm of breast: Secondary | ICD-10-CM

## 2014-06-29 ENCOUNTER — Other Ambulatory Visit: Payer: Self-pay | Admitting: Dermatology

## 2014-07-21 ENCOUNTER — Ambulatory Visit (INDEPENDENT_AMBULATORY_CARE_PROVIDER_SITE_OTHER): Payer: Medicare Other | Admitting: Internal Medicine

## 2014-07-21 VITALS — BP 116/68 | HR 70 | Ht 68.0 in | Wt 266.0 lb

## 2014-07-21 DIAGNOSIS — I428 Other cardiomyopathies: Secondary | ICD-10-CM

## 2014-07-21 NOTE — Progress Notes (Signed)
HPI Patient is a 68 yo with a history of NICM  LVEF 45 to 50% echo  Also a history of HL, GERD, Obesity, reactive airways  I saw the patinet in January  She ws seen by Melba Coon in December.  Had viral URI at time.  CXR showed mild cardiac enlargement and ? Some vascular congestion When I saw her I recom an echo and labs Echo showed LVEF unchanged at 45 to 50%  BNP was up mildly  LDL was 121. Recommm lasix prn.\  Last in clinic in march    Breathing OK  Feels stressed  Foot improveing.   Balance is diffiulct    Allergies  Allergen Reactions  . Penicillins Hives  . Sulfonamide Derivatives Nausea And Vomiting    Current Outpatient Prescriptions  Medication Sig Dispense Refill  . Aclidinium Bromide 400 MCG/ACT AEPB Inhale into the lungs. As needed for cough      . carvedilol (COREG) 3.125 MG tablet take 1 tablet by mouth twice a day with meals  60 tablet  6  . fluticasone (FLONASE) 50 MCG/ACT nasal spray Place into both nostrils. As needed      . HYDROcodone-acetaminophen (NORCO/VICODIN) 5-325 MG per tablet Take 1 tablet by mouth every 6 (six) hours as needed for moderate pain.  30 tablet  0  . losartan (COZAAR) 50 MG tablet take 1 tablet by mouth once daily  30 tablet  3  . pantoprazole (PROTONIX) 40 MG tablet Take 40 mg by mouth daily.        No current facility-administered medications for this visit.    Past Medical History  Diagnosis Date  . Other primary cardiomyopathies   . Esophageal reflux   . Unspecified venous (peripheral) insufficiency   . Obesity, unspecified   . Arthropathy, unspecified, site unspecified   . Other acute sinusitis   . Obstructive sleep apnea (adult) (pediatric)   . Arthritis   . Psoriasis   . Shortness of breath   . COPD (chronic obstructive pulmonary disease)     Past Surgical History  Procedure Laterality Date  . Replacement total knee      rt and lt  . Bladder surgery  2011    tac=ant/post  . 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  Pelvic floor repair    . Abdominal hysterectomy  2011  . Irrigation and debridement of wound with split thickness skin graft Right 01/25/2014    Procedure: RIGHT FOOT IRRIGATION AND DEBRIDEMENT WITH ACELL/SPLICK THICKNESS SKINGRAFT WITH VAC;  Surgeon: Wayland Denis, DO;  Location: [facility];  Service: Plastics;  Laterality: Right;    Family History  Problem Relation Age of Onset  . Prostate cancer Father     History   Social History  . Marital Status: Married    Spouse Name: N/A    Number of Children: N/A  . Years of Education: N/A   Occupational History  . School teacher    Social History Main Topics  . Smoking status: Former Smoker -- 0.50 packs/day for 10 years    Types: Cigarettes    Quit date: 10/28/1979  . Smokeless tobacco: Never Used  . Alcohol Use: Yes     Comment: Glass of wine per day  . Drug Use: Not on file  . Sexual Activity: Not on file   Other Topics Concern  . Not on file   Social History Narrative  . No narrative on file    Review of Systems:  All systems reviewed.  They are negative to the above problem except as previously stated.  Vital Signs: BP 116/68  Pulse 70  Ht  (1.727 m)  Wt 266 lb (120.657 kg)  BMI 40.45 kg/m2  Physical Exam Patinet is in NAD though coughing. HEENT:  Normocephalic, atraumatic. EOMI, PERRLA.  Neck: JVP is normal.  No bruits.  Lungs: clear to auscultation. No rales no wheezes.  Heart: Regular rate and rhythm. Normal S1, S2. No S3.   No significant murmurs. PMI not displaced.  Abdomen:  Supple, nontender. Normal bowel sounds. No masses. No hepatomegaly.  Extremities:   Good distal pulses throughout. Tr lower extremity edema.  Musculoskeletal :moving all extremities.  Neuro:   alert and oriented x3.  CN II-XII grossly intact.  EKG  SR 70  PVC.   Assessment and Plan:  1.  NICM  Volume status looks OK  Keep on same regimen  2.  HL  Set patinet to have labs checked.     3.  GERD    4.  Pulm   Followed in pulmonary  Eincuraged her to increase activity as able.  Will follow up in October

## 2014-07-21 NOTE — Patient Instructions (Signed)
Your physician recommends that you continue on your current medications as directed. Please refer to the Current Medication list given to you today. You have been given a prescription today for lab work.  Take it to your appointment when you have labs drawn at your PCP office.  The results should be faxed to Dr. Tenny Craw. Your physician wants you to follow-up in: April 2016 with Dr. Tenny Craw.   You will receive a reminder letter in the mail two months in advance. If you don't receive a letter, please call our office to schedule the follow-up appointment.

## 2014-08-02 ENCOUNTER — Encounter: Payer: Self-pay | Admitting: Internal Medicine

## 2014-08-18 ENCOUNTER — Other Ambulatory Visit: Payer: Self-pay | Admitting: Internal Medicine

## 2014-08-23 ENCOUNTER — Other Ambulatory Visit: Payer: Self-pay | Admitting: Internal Medicine

## 2014-08-23 MED ORDER — CARVEDILOL 3.125 MG PO TABS: ORAL_TABLET | ORAL | Status: DC

## 2015-01-15 ENCOUNTER — Ambulatory Visit (INDEPENDENT_AMBULATORY_CARE_PROVIDER_SITE_OTHER): Payer: Medicare Other | Admitting: Internal Medicine

## 2015-01-15 ENCOUNTER — Encounter: Payer: Self-pay | Admitting: Internal Medicine

## 2015-01-15 VITALS — BP 120/84 | HR 94 | Ht 68.0 in | Wt 263.8 lb

## 2015-01-15 DIAGNOSIS — I10 Essential (primary) hypertension: Secondary | ICD-10-CM

## 2015-01-15 DIAGNOSIS — I5022 Chronic systolic (congestive) heart failure: Secondary | ICD-10-CM

## 2015-01-15 NOTE — Progress Notes (Signed)
Cardiology Office Note   Date:  01/15/2015   ID:  Crystal Yoder, Crystal Yoder [birth year], MRN [id number]  PCP:  Cala Bradford, MD  Cardiologist:   Dietrich Pates, MD   Chief Complaint  Patient presents with  . Appointment      History of Present Illness: Crystal Yoder is a 69 y.o. female with a history of NICM LVEF 45 to 50% echo in Spring 2015    Also a hstiroy of HL, GERD , obesity, reactive airway dz  ILast in clinic in September  Since then breathing has been stable  Cough better  No CP Try new supplement to lose wt  Wt down 10 lb           Current Outpatient Prescriptions  Medication Sig Dispense Refill  . Aclidinium Bromide 400 MCG/ACT AEPB Inhale into the lungs. As needed for cough    . carvedilol (COREG) 3.125 MG tablet take 1 tablet by mouth twice a day with meals 180 tablet 2  . fluticasone (FLONASE) 50 MCG/ACT nasal spray Place into both nostrils. As needed    . losartan (COZAAR) 50 MG tablet take 1 tablet by mouth once daily 90 tablet 2  . pantoprazole (PROTONIX) 40 MG tablet Take 40 mg by mouth daily.     Marland Kitchen HYDROcodone-acetaminophen (NORCO/VICODIN) 5-325 MG per tablet Take 1 tablet by mouth every 6 (six) hours as needed for moderate pain. (Patient not taking: Reported on 01/15/2015) 30 tablet 0   No current facility-administered medications for this visit.    Allergies:   Penicillins and Sulfonamide derivatives   Past Medical History  Diagnosis Date  . Other primary cardiomyopathies   . Esophageal reflux   . Unspecified venous (peripheral) insufficiency   . Obesity, unspecified   . Arthropathy, unspecified, site unspecified   . Other acute sinusitis   . Obstructive sleep apnea (adult) (pediatric)   . Arthritis   . Psoriasis   . Shortness of breath   . COPD (chronic obstructive pulmonary disease)     Past Surgical History  Procedure Laterality Date  . Replacement total knee      rt and lt  . Bladder surgery  2011    tac=ant/post  . Splenectomy  1966      cyst spleen-large  . Pelvic floor repair    . Abdominal hysterectomy  2011  . Irrigation and debridement of wound with split thickness skin graft Right 01/25/2014    Procedure: RIGHT FOOT IRRIGATION AND DEBRIDEMENT WITH ACELL/SPLICK THICKNESS SKINGRAFT WITH VAC;  Surgeon: Wayland Denis, DO;  Location: [facility];  Service: Plastics;  Laterality: Right;     Social History:  The patient  reports that she quit smoking about 35 years ago. Her smoking use included Cigarettes. She has a 5 pack-year smoking history. She has never used smokeless tobacco. She reports that she drinks alcohol.   Family History:  The patient's family history includes Prostate cancer in her father.    ROS:  Please see the history of present illness. All other systems are reviewed and  Negative to the above problem except as noted.    PHYSICAL EXAM: VS:  BP 120/84 mmHg  Pulse 94  Ht 5\' 8"  (1.727 m)  Wt 263 lb 12.8 oz (119.659 kg)  BMI 40.12 kg/m2  GEN: Morbidly obese 69 yo in NAD  HEENT: normal Neck: no JVD, carotid bruits, or masses Cardiac: RRR; no murmurs, rubs, or gallops,no edema  Respiratory:  clear to auscultation bilaterally, normal  work of breathing GI: soft, nontender, nondistended, + BS  No hepatomegaly  MS: no deformity Moving all extremities   Skin: warm and dry, no rash Neuro:  Strength and sensation are intact Psych: euthymic mood, full affect   EKG:  EKG is not ordered today.   Lipid Panel    Component Value Date/Time   CHOL 201* 10/31/2013 1440   TRIG 42.0 10/31/2013 1440   TRIG 75 10/23/2006 0812   HDL 67.80 10/31/2013 1440   CHOLHDL 3 10/31/2013 1440   CHOLHDL 4.7 CALC 10/23/2006 0812   VLDL 8.4 10/31/2013 1440   LDLCALC 98 03/02/2012 1010   LDLDIRECT 121.5 10/31/2013 1440      Wt Readings from Last 3 Encounters:  01/15/15 263 lb 12.8 oz (119.659 kg)  07/21/14 266 lb (120.657 kg)  01/25/14 263 lb 4 oz (119.409 kg)      ASSESSMENT AND PLAN: 1  Chronic  CHF  Volume status looks good  I would keep on same regimen  2.  HTN  Good control      Current medicines are reviewed at length with the patient today.  The patient does not have concerns regarding medicines.  The following changes have been made: None    Labs/ tests ordered today include:None here   Will get labs from primary   No orders of the defined types were placed in this encounter.     Disposition:   FU with me in October    Signed, Zethan Alfieri, MD  01/15/2015 12:10 PM    [facility] [address] [place], [place], [place]  [address] Phone: [phone]; Fax: [phone]

## 2015-01-15 NOTE — Patient Instructions (Signed)
Your physician recommends that you continue on your current medications as directed. Please refer to the Current Medication list given to you today. Your physician wants you to follow-up in: 6 months with Dr. Tenny Craw.  You will receive a reminder letter in the mail two months in advance. If you don't receive a letter, please call our office to schedule the follow-up appointment.  We will request recent lab work from Dr. Cliffton Asters.

## 2015-04-20 ENCOUNTER — Other Ambulatory Visit: Payer: Self-pay | Admitting: Internal Medicine

## 2015-05-18 ENCOUNTER — Other Ambulatory Visit: Payer: Self-pay | Admitting: Family Medicine

## 2015-05-18 DIAGNOSIS — Z1231 Encounter for screening mammogram for malignant neoplasm of breast: Secondary | ICD-10-CM

## 2015-06-21 ENCOUNTER — Ambulatory Visit: Payer: Medicare Other

## 2015-06-27 ENCOUNTER — Ambulatory Visit
Admission: RE | Admit: 2015-06-27 | Discharge: 2015-06-27 | Disposition: A | Discharge status: Home / Self Care | Payer: Medicare Other | Source: Ambulatory Visit | Attending: Family Medicine | Admitting: Family Medicine

## 2015-06-27 DIAGNOSIS — Z1231 Encounter for screening mammogram for malignant neoplasm of breast: Secondary | ICD-10-CM

## 2015-08-02 ENCOUNTER — Encounter: Payer: Self-pay | Admitting: Internal Medicine

## 2015-08-02 ENCOUNTER — Ambulatory Visit (INDEPENDENT_AMBULATORY_CARE_PROVIDER_SITE_OTHER): Payer: Medicare Other | Admitting: Internal Medicine

## 2015-08-02 VITALS — BP 110/60 | HR 65 | Ht 68.0 in | Wt 267.6 lb

## 2015-08-02 DIAGNOSIS — E785 Hyperlipidemia, unspecified: Secondary | ICD-10-CM | POA: Diagnosis not present

## 2015-08-02 DIAGNOSIS — I429 Cardiomyopathy, unspecified: Secondary | ICD-10-CM | POA: Diagnosis not present

## 2015-08-02 DIAGNOSIS — I428 Other cardiomyopathies: Secondary | ICD-10-CM

## 2015-08-02 NOTE — Progress Notes (Signed)
Cardiology Office Note   Date:  08/02/2015   ID:  [name], [name] [birth year], MRN [id number]  PCP:  Cala Bradford, MD  Cardiologist:   Dietrich Pates, MD   Chief Complaint  Patient presents with  . Follow-up    chf      History of Present Illness: ADRIYANNA Yoder is a 69 y.o. female with a history of NICM LVEF 45 to 50% echo in Spring 2015 Also a hstiroy of HL, GERD , obesity, reactive airway dz Since I saw her her breathing has been stable  No edema SHe had 2 spells this summer  Epigastric discomfort.  Down  Also had rash on face.  Couldn't talk  For one episode when to ER  ? Allergic Rxn. Limited by feet.      Current Outpatient Prescriptions  Medication Sig Dispense Refill  . carvedilol (COREG) 3.125 MG tablet take 1 tablet by mouth twice a day with meals 180 tablet 2  . losartan (COZAAR) 50 MG tablet take 1 tablet by mouth once daily 90 tablet 3  . pantoprazole (PROTONIX) 40 MG tablet Take 40 mg by mouth daily.      No current facility-administered medications for this visit.    Allergies:   Penicillins and Sulfonamide derivatives   Past Medical History  Diagnosis Date  . Other primary cardiomyopathies   . Esophageal reflux   . Unspecified venous (peripheral) insufficiency   . Obesity, unspecified   . Arthropathy, unspecified, site unspecified   . Other acute sinusitis   . Obstructive sleep apnea (adult) (pediatric)   . Arthritis   . Psoriasis   . Shortness of breath   . COPD (chronic obstructive pulmonary disease) [facility])     Past Surgical History  Procedure Laterality Date  . Replacement total knee      rt and lt  . Bladder surgery  2011    tac=ant/post  . 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 Pelvic floor repair    . Abdominal hysterectomy  2011  . Irrigation and debridement of wound with split thickness skin graft Right 01/25/2014    Procedure: RIGHT FOOT IRRIGATION AND DEBRIDEMENT WITH ACELL/SPLICK THICKNESS SKINGRAFT WITH VAC;   Surgeon: Wayland Denis, DO;  Location: [facility];  Service: Plastics;  Laterality: Right;     Social History:  The patient  reports that she quit smoking about 35 years ago. Her smoking use included Cigarettes. She has a 5 pack-year smoking history. She has never used smokeless tobacco. She reports that she drinks alcohol.   Family History:  The patient's family history includes Prostate cancer in her father.    ROS:  Please see the history of present illness. All other systems are reviewed and  Negative to the above problem except as noted.    PHYSICAL EXAM: VS:  BP 110/60 mmHg  Pulse 65  Ht  (1.727 m)  Wt 267 lb 9.6 oz (121.383 kg)  BMI 40.70 kg/m2  SpO2 97%  GEN: Well nourished, well developed, in no acute distress HEENT: normal Neck: no JVD, carotid bruits, or masses Cardiac: RRR; no murmurs, rubs, or gallops,no edema  Respiratory:  clear to auscultation bilaterally, normal work of breathing GI: soft, nontender, nondistended, + BS  No hepatomegaly  MS: no deformity Moving all extremities   Skin: warm and dry, no rash Neuro:  Strength and sensation are intact Psych: euthymic mood, full affect   EKG:  EKG is not  ordered  today.   Lipid Panel    Component Value Date/Time   CHOL 201* 10/31/2013 1440   TRIG 42.0 10/31/2013 1440   TRIG 75 10/23/2006 0812   HDL 67.80 10/31/2013 1440   CHOLHDL 3 10/31/2013 1440   CHOLHDL 4.7 CALC 10/23/2006 0812   VLDL 8.4 10/31/2013 1440   LDLCALC 98 03/02/2012 1010   LDLDIRECT 121.5 10/31/2013 1440      Wt Readings from Last 3 Encounters:  08/02/15 267 lb 9.6 oz (121.383 kg)  01/15/15 263 lb 12.8 oz (119.659 kg)  07/21/14 266 lb (120.657 kg)      ASSESSMENT AND PLAN:  1  NICM.  Volume status continues to be good  No change in regimen  2.  "spell"  Episodes sound more allergic .  I do not think ischemic  Follow  Consider referral to allergy  3.  Lipids  Last lipid panel in 2015  LDL was 121.  Watch diet    Will get lipid panel from Dr Lucilla Lame office     Signed, Dietrich Pates, MD  08/02/2015 2:00 PM    [facility] [address] [place], [place], [place]  [address] Phone: [phone]; Fax: [phone]

## 2015-08-03 ENCOUNTER — Encounter: Payer: Self-pay | Admitting: Internal Medicine

## 2015-09-03 ENCOUNTER — Other Ambulatory Visit: Payer: Self-pay | Admitting: Internal Medicine

## 2016-02-11 ENCOUNTER — Encounter: Payer: Self-pay | Admitting: Internal Medicine

## 2016-02-11 ENCOUNTER — Ambulatory Visit (INDEPENDENT_AMBULATORY_CARE_PROVIDER_SITE_OTHER): Payer: Medicare Other | Admitting: Internal Medicine

## 2016-02-11 VITALS — BP 124/72 | HR 68 | Ht 68.0 in | Wt 264.4 lb

## 2016-02-11 DIAGNOSIS — I429 Cardiomyopathy, unspecified: Secondary | ICD-10-CM

## 2016-02-11 NOTE — Progress Notes (Signed)
Cardiology Office Note   Date:  02/11/2016   ID:  Crystal Yoder, DOB [birth year], MRN [id number]  PCP:  Cala Bradford, MD  Cardiologist:   Dietrich Pates, MD   F/u of CHF     History of Present Illness: Crystal Yoder is a 70 y.o. female with a history of  NICM LVEF 45 to 50% echo in Spring 2015 Also a hstiroy of HL, GERD , obesity, reactive airway dz  was last in clinic in Oct 2016  Since seen her breathing has been good  No CP  No swelling   Still having problesm with pains in R foot due to injury  Having trouble sleeping at night   Cant stop worrying    Outpatient Prescriptions Prior to Visit  Medication Sig Dispense Refill  . carvedilol (COREG) 3.125 MG tablet take 1 tablet by mouth twice a day with meals 180 tablet 2  . losartan (COZAAR) 50 MG tablet take 1 tablet by mouth once daily 90 tablet 3  . pantoprazole (PROTONIX) 40 MG tablet Take 40 mg by mouth daily.      No facility-administered medications prior to visit.     Allergies:   Sulfa antibiotics; Penicillins; and Sulfonamide derivatives   Past Medical History  Diagnosis Date  . Other primary cardiomyopathies   . Esophageal reflux   . Unspecified venous (peripheral) insufficiency   . Obesity, unspecified   . Arthropathy, unspecified, site unspecified   . Other acute sinusitis   . Obstructive sleep apnea (adult) (pediatric)   . Arthritis   . Psoriasis   . Shortness of breath   . COPD (chronic obstructive pulmonary disease) [facility])     Past Surgical History  Procedure Laterality Date  . Replacement total knee      rt and lt  . Bladder surgery  2011    tac=ant/post  . 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 Pelvic floor repair    . Abdominal hysterectomy  2011  . Irrigation and debridement of wound with split thickness skin graft Right 01/25/2014    Procedure: RIGHT FOOT IRRIGATION AND DEBRIDEMENT WITH ACELL/SPLICK THICKNESS SKINGRAFT WITH VAC;  Surgeon: Wayland Denis, DO;  Location: [facility];  Service: Plastics;  Laterality: Right;     Social History:  The patient  reports that she quit smoking about 36 years ago. Her smoking use included Cigarettes. She has a 5 pack-year smoking history. She has never used smokeless tobacco. She reports that she drinks alcohol.   Family History:  The patient's family history includes Prostate cancer in her father.    ROS:  Please see the history of present illness. All other systems are reviewed and  Negative to the above problem except as noted.    PHYSICAL EXAM: VS:  BP 124/72 mmHg  Pulse 68  Ht 5\' 8"  (1.727 m)  Wt 264 lb 6.4 oz (119.931 kg)  BMI 40.21 kg/m2  GEN: Morbidly obese 70 yo , in no acute distress HEENT: normal Neck: no JVD, carotid bruits, or masses Cardiac: RRR; no murmurs, rubs, or gallops,no edema  Respiratory:  clear to auscultation bilaterally, normal work of breathing GI: soft, nontender, nondistended, + BS  No hepatomegaly  MS: no deformity Moving all extremities   Skin: warm and dry, no rash Neuro:  Strength and sensation are intact Psych: euthymic mood, full affect   EKG:  EKG is ordered today.  SR 60  LVH  QRS widening  Lipid Panel    Component Value Date/Time   CHOL 201* 10/31/2013 1440   TRIG 42.0 10/31/2013 1440   TRIG 75 10/23/2006 0812   HDL 67.80 10/31/2013 1440   CHOLHDL 3 10/31/2013 1440   CHOLHDL 4.7 CALC 10/23/2006 0812   VLDL 8.4 10/31/2013 1440   LDLCALC 98 03/02/2012 1010   LDLDIRECT 121.5 10/31/2013 1440      Wt Readings from Last 3 Encounters:  02/11/16 264 lb 6.4 oz (119.931 kg)  08/02/15 267 lb 9.6 oz (121.383 kg)  01/15/15 263 lb 12.8 oz (119.659 kg)      ASSESSMENT AND PLAN: 1 Chronic systolic CHF  Volume status is good  Keep on same regimen  WIll have labs by primeary MD  2.  HTN  Good control    Signed, Dietrich Pates, MD  02/11/2016 2:14 PM    [facility] [address] [place], [place], [place]  [address] Phone: [phone]; Fax:  [phone]

## 2016-02-11 NOTE — Patient Instructions (Signed)
Medication Instructions:  Your physician recommends that you continue on your current medications as directed. Please refer to the Current Medication list given to you today.   Labwork: None ordered  Testing/Procedures: None ordered.  Follow-Up: Your physician wants you to follow-up in: November 2017 with Dr.Ross You will receive a reminder letter in the mail two months in advance. If you don't receive a letter, please call our office to schedule the follow-up appointment.   Any Other Special Instructions Will Be Listed Below (If Applicable).     If you need a refill on your cardiac medications before your next appointment, please call your pharmacy.

## 2016-04-17 ENCOUNTER — Other Ambulatory Visit: Payer: Self-pay | Admitting: Family Medicine

## 2016-04-17 DIAGNOSIS — Z1231 Encounter for screening mammogram for malignant neoplasm of breast: Secondary | ICD-10-CM

## 2016-05-08 ENCOUNTER — Other Ambulatory Visit: Payer: Self-pay | Admitting: *Deleted

## 2016-05-08 MED ORDER — LOSARTAN POTASSIUM 50 MG PO TABS: 50.0000 mg | ORAL_TABLET | Freq: Every day | ORAL | Status: DC

## 2016-05-27 ENCOUNTER — Other Ambulatory Visit: Payer: Self-pay | Admitting: Gastroenterology

## 2016-05-29 ENCOUNTER — Other Ambulatory Visit: Payer: Self-pay | Admitting: Internal Medicine

## 2016-07-07 ENCOUNTER — Ambulatory Visit
Admission: RE | Admit: 2016-07-07 | Discharge: 2016-07-07 | Disposition: A | Discharge status: Home / Self Care | Payer: Medicare Other | Source: Ambulatory Visit | Attending: Family Medicine | Admitting: Family Medicine

## 2016-07-07 DIAGNOSIS — Z1231 Encounter for screening mammogram for malignant neoplasm of breast: Secondary | ICD-10-CM

## 2016-07-22 ENCOUNTER — Encounter: Payer: Self-pay | Admitting: Internal Medicine

## 2016-07-25 ENCOUNTER — Encounter: Payer: Self-pay | Admitting: Internal Medicine

## 2016-08-11 ENCOUNTER — Telehealth: Payer: Self-pay | Admitting: Internal Medicine

## 2016-08-11 ENCOUNTER — Ambulatory Visit (INDEPENDENT_AMBULATORY_CARE_PROVIDER_SITE_OTHER): Payer: Medicare Other | Admitting: Internal Medicine

## 2016-08-11 ENCOUNTER — Encounter (INDEPENDENT_AMBULATORY_CARE_PROVIDER_SITE_OTHER): Payer: Self-pay

## 2016-08-11 ENCOUNTER — Encounter: Payer: Self-pay | Admitting: Internal Medicine

## 2016-08-11 VITALS — BP 130/90 | HR 66 | Ht 68.0 in | Wt 265.4 lb

## 2016-08-11 DIAGNOSIS — I519 Heart disease, unspecified: Secondary | ICD-10-CM | POA: Diagnosis not present

## 2016-08-11 NOTE — Patient Instructions (Signed)
Your physician recommends that you continue on your current medications as directed. Please refer to the Current Medication list given to you today. Your physician has requested that you have an echocardiogram. Echocardiography is a painless test that uses sound waves to create images of your heart. It provides your doctor with information about the size and shape of your heart and how well your heart's chambers and valves are working. This procedure takes approximately one hour. There are no restrictions for this procedure. Your physician wants you to follow-up in: May, 2018 with Dr. Tenny Craw.  You will receive a reminder letter in the mail two months in advance. If you don't receive a letter, please call our office to schedule the follow-up appointment.

## 2016-08-11 NOTE — Telephone Encounter (Signed)
New Message  Pt call requesting to speak with RN. Pt states the insurance company is going to need pre authorization for pt echo schedule for 11/1. Please call back to discuss

## 2016-08-11 NOTE — Progress Notes (Signed)
Cardiology Office Note   Date:  08/11/2016   ID:  Crystal Yoder, DOB 24-Oct-1946, MRN [id number]  PCP:  Cala Bradford, MD  Cardiologist:   Dietrich Pates, MD    F/U of mild LV dysfunction     History of Present Illness: Crystal Yoder is a 70 y.o. female with a history of NICM  LVEF 45 to 50% in echo in 2015  Also GERD, HL, reactive airway dz  I saw hier im April 2017   Since I saw her she complains of more fatigue  Tired  Takes naps often  Has chronic sinus drainage  Throat sore in AM  Voice hoarse    No CP        Outpatient Medications Prior to Visit  Medication Sig Dispense Refill  . carvedilol (COREG) 3.125 MG tablet take 1 tablet by mouth twice a day with meals 180 tablet 2  . losartan (COZAAR) 50 MG tablet Take 1 tablet (50 mg total) by mouth daily. 90 tablet 2  . pantoprazole (PROTONIX) 40 MG tablet Take 40 mg by mouth daily.      No facility-administered medications prior to visit.      Allergies:   Other; Penicillins; Sulfa antibiotics; and Sulfonamide derivatives   Past Medical History:  Diagnosis Date  . Arthritis   . Arthropathy, unspecified, site unspecified   . COPD (chronic obstructive pulmonary disease) (HCC)   . Esophageal reflux   . Obesity, unspecified   . Obstructive sleep apnea (adult) (pediatric)   . Other acute sinusitis   . Other primary cardiomyopathies   . Psoriasis   . Shortness of breath   . Unspecified venous (peripheral) insufficiency     Past Surgical History:  Procedure Laterality Date  . ABDOMINAL HYSTERECTOMY  2011  . BLADDER SURGERY  2011   tac=ant/post  . IRRIGATION AND DEBRIDEMENT OF WOUND WITH SPLIT THICKNESS SKIN GRAFT Right 01/25/2014   Procedure: RIGHT FOOT IRRIGATION AND DEBRIDEMENT WITH ACELL/SPLICK THICKNESS SKINGRAFT WITH VAC;  Surgeon: Wayland Denis, DO;  Location: [facility];  Service: Plastics;  Laterality: Right;  . PELVIC FLOOR REPAIR    . REPLACEMENT TOTAL KNEE     rt and lt  . SPLENECTOMY   1966   cyst spleen-large     Social History:  The patient  reports that she quit smoking about 36 years ago. Her smoking use included Cigarettes. She has a 5.00 pack-year smoking history. She has never used smokeless tobacco. She reports that she drinks alcohol.   Family History:  The patient's family history includes Prostate cancer in her father.    ROS:  Please see the history of present illness. All other systems are reviewed and  Negative to the above problem except as noted.    PHYSICAL EXAM: VS:  BP 130/90   Pulse 66   Ht 5\' 8"  (1.727 m)   Wt 265 lb 6.4 oz (120.4 kg)   SpO2 96%   BMI 40.35 kg/m   GEN: Well nourished, well developed, in no acute distress HEENT: normal Neck: no JVD, carotid bruits, or masses Cardiac: RRR; no murmurs, rubs, or gallops,no edema  Respiratory:  clear to auscultation bilaterally, normal work of breathing GI: soft, nontender, nondistended, + BS  No hepatomegaly  MS: no deformity Moving all extremities   Skin: warm and dry, no rash Neuro:  Strength and sensation are intact Psych: euthymic mood, full affect   EKG:  EKG is not ordered today.   Lipid Panel  Component Value Date/Time   CHOL 201 (H) 10/31/2013 1440   TRIG 42.0 10/31/2013 1440   TRIG 75 10/23/2006 0812   HDL 67.80 10/31/2013 1440   CHOLHDL 3 10/31/2013 1440   VLDL 8.4 10/31/2013 1440   LDLCALC 98 03/02/2012 1010   LDLDIRECT 121.5 10/31/2013 1440      Wt Readings from Last 3 Encounters:  08/11/16 265 lb 6.4 oz (120.4 kg)  02/11/16 264 lb 6.4 oz (119.9 kg)  08/02/15 267 lb 9.6 oz (121.4 kg)      ASSESSMENT AND PLAN:  1  NICM  WIll recheck echo  I do not see any evid of volume increase on exam  2  Fatigue  Labs this summer OK  Will get ech0  2  HTN  Diastolic 90  Follow    3  HL  REcomm trial of Cholestoff   F/U in 1 year     Current medicines are reviewed at length with the patient today.  The patient does not have concerns regarding  medicines.  Signed, Dietrich PatesPaula Francee Setzer, MD  08/11/2016 12:02 PM    [facility] [address], [place], [place]  [address] Phone: [phone]; Fax: [phone]

## 2016-08-12 NOTE — Telephone Encounter (Signed)
Sent staff message to billing to verify pre cert.

## 2016-08-13 NOTE — Telephone Encounter (Signed)
Spoke with patient's husband (DPR on file).  Advised that per billing no pre cert is needed at this time for patient's upcoming echocardiogram:   From: Mackie Pai  Sent: 08/12/2016  2:42 PM  To: Lendon Ka, RN  Subject: RE: echo                     I ran the precert through on [employer] website at the end it states due to prior auth reduction program the state of [place] at this time does not require precert - I sent you the message via email Incase the patient wanted a copy. Please call me Im here till 4:00 pm Have a great day

## 2016-08-27 ENCOUNTER — Other Ambulatory Visit: Payer: Self-pay

## 2016-08-27 ENCOUNTER — Ambulatory Visit (HOSPITAL_COMMUNITY): Payer: Medicare Other | Attending: Cardiovascular Disease

## 2016-08-27 DIAGNOSIS — E785 Hyperlipidemia, unspecified: Secondary | ICD-10-CM | POA: Insufficient documentation

## 2016-08-27 DIAGNOSIS — Z6841 Body Mass Index (BMI) 40.0 and over, adult: Secondary | ICD-10-CM | POA: Insufficient documentation

## 2016-08-27 DIAGNOSIS — E669 Obesity, unspecified: Secondary | ICD-10-CM | POA: Insufficient documentation

## 2016-08-27 DIAGNOSIS — I071 Rheumatic tricuspid insufficiency: Secondary | ICD-10-CM | POA: Insufficient documentation

## 2016-08-27 DIAGNOSIS — Z87891 Personal history of nicotine dependence: Secondary | ICD-10-CM | POA: Insufficient documentation

## 2016-08-27 DIAGNOSIS — I519 Heart disease, unspecified: Secondary | ICD-10-CM | POA: Insufficient documentation

## 2016-08-29 ENCOUNTER — Telehealth: Payer: Self-pay | Admitting: Internal Medicine

## 2016-08-29 ENCOUNTER — Ambulatory Visit (INDEPENDENT_AMBULATORY_CARE_PROVIDER_SITE_OTHER): Payer: Medicare Other | Admitting: Internal Medicine

## 2016-08-29 ENCOUNTER — Encounter: Payer: Self-pay | Admitting: Internal Medicine

## 2016-08-29 VITALS — BP 124/80 | HR 64 | Ht 68.0 in | Wt 269.0 lb

## 2016-08-29 DIAGNOSIS — L409 Psoriasis, unspecified: Secondary | ICD-10-CM | POA: Insufficient documentation

## 2016-08-29 DIAGNOSIS — Z23 Encounter for immunization: Secondary | ICD-10-CM

## 2016-08-29 DIAGNOSIS — G629 Polyneuropathy, unspecified: Secondary | ICD-10-CM | POA: Insufficient documentation

## 2016-08-29 DIAGNOSIS — M19041 Primary osteoarthritis, right hand: Secondary | ICD-10-CM | POA: Insufficient documentation

## 2016-08-29 DIAGNOSIS — J209 Acute bronchitis, unspecified: Secondary | ICD-10-CM

## 2016-08-29 DIAGNOSIS — J0100 Acute maxillary sinusitis, unspecified: Secondary | ICD-10-CM

## 2016-08-29 DIAGNOSIS — J42 Unspecified chronic bronchitis: Secondary | ICD-10-CM | POA: Diagnosis not present

## 2016-08-29 DIAGNOSIS — G4733 Obstructive sleep apnea (adult) (pediatric): Secondary | ICD-10-CM | POA: Diagnosis not present

## 2016-08-29 DIAGNOSIS — M19042 Primary osteoarthritis, left hand: Secondary | ICD-10-CM

## 2016-08-29 DIAGNOSIS — Z9081 Acquired absence of spleen: Secondary | ICD-10-CM | POA: Insufficient documentation

## 2016-08-29 DIAGNOSIS — M19072 Primary osteoarthritis, left ankle and foot: Secondary | ICD-10-CM

## 2016-08-29 DIAGNOSIS — M19071 Primary osteoarthritis, right ankle and foot: Secondary | ICD-10-CM | POA: Insufficient documentation

## 2016-08-29 DIAGNOSIS — E559 Vitamin D deficiency, unspecified: Secondary | ICD-10-CM | POA: Insufficient documentation

## 2016-08-29 DIAGNOSIS — J0181 Other acute recurrent sinusitis: Secondary | ICD-10-CM

## 2016-08-29 DIAGNOSIS — Z96653 Presence of artificial knee joint, bilateral: Secondary | ICD-10-CM | POA: Insufficient documentation

## 2016-08-29 LAB — NITRIC OXIDE: Nitric Oxide: 7

## 2016-08-29 MED ORDER — PHENYLEPHRINE HCL 1 % NA SOLN
3.0000 [drp] | Freq: Once | NASAL | Status: AC
  Administered 2016-08-29: 3 [drp] via NASAL

## 2016-08-29 MED ORDER — AZITHROMYCIN 250 MG PO TABS: ORAL_TABLET | ORAL | 0 refills | Status: DC

## 2016-08-29 NOTE — Assessment & Plan Note (Signed)
Sustained sense of head congestion over the last month with occasional headache. There is little pollen out now to blame for an allergy process. I think we should manage with decongestant and antibiotics as a bacterial sinusitis, watching for response. Plan-Z-Pak noting Crystal Yoder medication intolerances, Sudafed if tolerated, nasal nebulizer decongestant treatment here

## 2016-08-29 NOTE — Telephone Encounter (Signed)
Called spoke with patient She would prefer to be seen for appt Okay per CY to add on @ 1:30pm today Pt okay with this appt time - appt scheduled Nothing further needed; will sign off

## 2016-08-29 NOTE — Progress Notes (Signed)
Patient ID: Crystal Yoder, female    DOB: January 22, 1946, 70 y.o.   MRN: [id number]    04/18/13- 65 yoF former smoker followed for OSA, allergic rhinitis, COPD/ chronic cough,  complicated by hx of GERD, peripheral venous insufficiency, OHS FOLLOWS FOR: currently on a new diet plan-not using inhalers at this time. States cough has gotten better since being on diet. Her main complaint was cough. She credits her chiropractor for providing a weight loss diet and wellness health plan "[facility] Metabolic Diet which she is following for 30 days. He is also providing homeopathic medications so her only prescription medications are her heart meds. She is not using CPAP.  10/13/13- 67 yoF former smoker followed for OSA, allergic rhinitis, COPD/ chronic cough,  complicated by hx of GERD, peripheral venous insufficiency, OHS  Husband is here (snorting and sniffing, but denies he is sick). Pt c/o congestion in both chest and sinuses, prod cough with yellow/clear mucous X2 wks.   Had just finished a Z-Pak. Hard coughing. Fever blister on chin. No GI upset. Denies myalgias or fever. For the last 2 nights has felt smothered, lying on one pillow.  08/29/2016-70 year old female former smoker followed for COPD/chronic cough, allergic rhinitis, OSA/failed CPAP, complicated by GERD, peripheral venous insufficiency, obesity, OHS, Asplenia FOLLOWS FOR: congestion x1 month, some cough and wheezing, sinus pressure/congestion, bilateral ear congestion, hoarseness.  denies any f/c/s, hemoptysis, chest pain No specific sick exposure. No fever. Cough usually dry. It occasionally wakes her. Scant sputum has been white. Sinus and ear pressure/congestion bothers her more than her chest now. She had called her insurance company nurse to ask whether it would be okay to get a flu shot with these protracted symptoms. Because of her asplenia and cardiomyopathy, she was told to see a physician. PFT 2012-mild obstruction small airways,  DLCO 54% Echocardiogram 08/27/2016-EF 40%-45%, , gr 1 diastolic dysfunction, chamber dilatation, PA peak 47 mmHg FENO 08/29/16  6- WNL  Review of Systems-see HPI    + = pos Constitutional:   No-   weight loss, night sweats, fevers, chills, + fatigue, lassitude. HEENT:   No-   headaches, difficulty swallowing, tooth/dental problems, sore throat,                  Little-   sneezing, itching, ear ache, +nasal congestion, post nasal drip,  CV:  No-   chest pain, orthopnea, PND, swelling in lower extremities, anasarca, dizziness, palpitations GI:  No-   heartburn, indigestion, abdominal pain, nausea, vomiting, diarrhea,                 change in bowel habits, loss of appetite Resp: + shortness of breath with exertion or at rest.  No-  excess mucus,             No-  coughing up of blood.  + Persistent dry cough           change in color of mucus.  No- wheezing.   Skin: No-   rash or lesions. GU: No-   dysuria,  MS:  No-acute  joint pain or swelling. Psych:  No- change in mood or affect. No depression or anxiety.  No memory loss.  Objective:   Physical Exam  General- Alert, Oriented, Affect-appropriate, Distress- none acute     Obese,  Skin- rash-none, lesions- none, excoriation- none Lymphadenopathy- none Head- atraumatic            Eyes- Gross vision intact, PERRLA, conjunctivae clear secretions  Ears- Hearing, canals normal            Nose- +sounds stuffy, No-Septal dev, mucus, polyps, erosion, perforation             Throat- Mallampati III-IV , mucosa clear , drainage- none, tonsils- atrophic. + horse Neck- flexible , trachea midline, no stridor , thyroid nl, carotid no bruit Chest - symmetrical excursion , unlabored           Heart/CV- RRR , no murmur , no gallop  , no rub, nl s1 s2                           - JVD- none , edema+ trace, stasis changes- none, varices- none           Lung-  Wheeze-None, cough+raspy; no cough with deep breath , dullness-none, rub- none            Chest wall-  Abd-  Br/ Gen/ Rectal- Not done, not indicated Extrem- cyanosis- none, clubbing, none, atrophy- none, strength- nl.  Superficial varices.  Neuro- grossly intact to observation

## 2016-08-29 NOTE — Assessment & Plan Note (Signed)
She was unable to tolerate repeated trials with CPAP and BiPAP Plan-ongoing efforts to lose weight and to sleep off flat of back.

## 2016-08-29 NOTE — Telephone Encounter (Signed)
We can work in here or I can send her antibiotic

## 2016-08-29 NOTE — Telephone Encounter (Signed)
Called and spoke with pt and she stated that she spoke with a nurse with her insurance company and was advised that she needed to be seen within 8 hours by a doctor.  She stated that x 56month she has had cough with thick sputum at times.. Wheezing that comes and goes and her ears feel full.  She did not want to go over to see her PCP since she normally gets sick everytime she goes over there.  Wanting to see what CY recs.     Last ov--10/13/2013 No pending appts  Allergies  Allergen Reactions  . Other Other (See Comments)    Other Reaction: GI Upset  . Penicillins Hives  . Sulfa Antibiotics Other (See Comments)    Other Reaction: GI Upset  . Sulfonamide Derivatives Nausea And Vomiting

## 2016-08-29 NOTE — Patient Instructions (Addendum)
Order- FENO   Dx acute bronchitis  Neb neo nasal  Dx acute maxillary sinusitis  Flu vax- senior  Script sent for Z pak  Consider trying Sudafed from pharmacy if you need a decongestant for stuffy head pressure feeling  Please call as needed

## 2016-09-01 ENCOUNTER — Encounter: Payer: Self-pay | Admitting: Rheumatology

## 2016-09-01 ENCOUNTER — Ambulatory Visit (INDEPENDENT_AMBULATORY_CARE_PROVIDER_SITE_OTHER): Payer: Medicare Other | Admitting: Rheumatology

## 2016-09-01 VITALS — BP 134/80 | HR 68 | Resp 16 | Ht 68.0 in | Wt 266.0 lb

## 2016-09-01 DIAGNOSIS — Z96653 Presence of artificial knee joint, bilateral: Secondary | ICD-10-CM | POA: Diagnosis not present

## 2016-09-01 DIAGNOSIS — M7062 Trochanteric bursitis, left hip: Secondary | ICD-10-CM

## 2016-09-01 DIAGNOSIS — M19072 Primary osteoarthritis, left ankle and foot: Secondary | ICD-10-CM

## 2016-09-01 DIAGNOSIS — L409 Psoriasis, unspecified: Secondary | ICD-10-CM | POA: Diagnosis not present

## 2016-09-01 DIAGNOSIS — G4730 Sleep apnea, unspecified: Secondary | ICD-10-CM | POA: Diagnosis not present

## 2016-09-01 DIAGNOSIS — M19042 Primary osteoarthritis, left hand: Secondary | ICD-10-CM | POA: Diagnosis not present

## 2016-09-01 DIAGNOSIS — E559 Vitamin D deficiency, unspecified: Secondary | ICD-10-CM

## 2016-09-01 DIAGNOSIS — M19041 Primary osteoarthritis, right hand: Secondary | ICD-10-CM | POA: Diagnosis not present

## 2016-09-01 DIAGNOSIS — M19071 Primary osteoarthritis, right ankle and foot: Secondary | ICD-10-CM | POA: Diagnosis not present

## 2016-09-01 DIAGNOSIS — G629 Polyneuropathy, unspecified: Secondary | ICD-10-CM

## 2016-09-01 DIAGNOSIS — I429 Cardiomyopathy, unspecified: Secondary | ICD-10-CM | POA: Diagnosis not present

## 2016-09-01 DIAGNOSIS — Z9081 Acquired absence of spleen: Secondary | ICD-10-CM

## 2016-09-01 NOTE — Progress Notes (Addendum)
*IMAGE* Office Visit Note  Patient: Crystal Yoder             Date of Birth: 11/10/1945           MRN: [id number]             PCP: Cala BradfordWHITE,CYNTHIA S, MD Referring: Laurann MontanaWhite, Cynthia, MD Visit Date: 09/01/2016 Occupation:Retired    Subjective:  Left hip pain   History of Present Illness: Crystal Yoder is a 70 y.o. female . She states that she is very stiff. She has been pain in the left hip he and she was seen by Dr. Eulah PontMurphy who did x-rays of her left hip. She brought x-rays for me to review today. 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 She's been having discomfort in the left trochanteric bursa which persist. She also had a stiffness in her bilateral hands due to underlying osteoarthritis. Her bilateral feet are painful due to osteoarthritis as well. She has had bilateral total knee replacement which causes discomfort. Her psoriasis is not very active she has few lesions on her elbows. She continues to have insomnia she has sleep apnea with residual fatigue. She has difficulty getting up from chair she believes that her muscle strength is not very good in her lower extremities.  Activities of Daily Living:  Patient reports morning stiffness for 2 hours.   Patient Reports nocturnal pain.  Difficulty dressing/grooming: Reports Difficulty climbing stairs: Reports Difficulty getting out of chair: Reports Difficulty using hands for taps, buttons, cutlery, and/or writing: Reports   Review of Systems  Constitutional: Positive for fatigue and weakness. Negative for night sweats, weight gain and weight loss.  HENT: Positive for mouth dryness. Negative for mouth sores, trouble swallowing, trouble swallowing and nose dryness.   Eyes: Positive for dryness. Negative for pain, redness and visual disturbance.  Respiratory: Negative for cough, shortness of breath and difficulty breathing.   Cardiovascular: Positive  for hypertension. Negative for chest pain, palpitations, irregular heartbeat and swelling in legs/feet.  Gastrointestinal: Negative for blood in stool, constipation and diarrhea.  Genitourinary: Negative for vaginal dryness.       Urination at night  Musculoskeletal: Positive for arthralgias, joint pain, myalgias, morning stiffness and myalgias. Negative for joint swelling, muscle weakness and muscle tenderness.  Skin: Negative for color change, rash, hair loss, skin tightness, ulcers and sensitivity to sunlight.  Allergic/Immunologic: Negative for susceptible to infections.  Neurological: Negative for dizziness, memory loss and night sweats.  Hematological: Negative for swollen glands.  Psychiatric/Behavioral: Positive for sleep disturbance. Negative for depressed mood. The patient is not nervous/anxious.     PMFS History:  Patient Active Problem List   Diagnosis Date Noted  . Psoriasis 08/29/2016  . Primary osteoarthritis of both hands 08/29/2016  . Primary osteoarthritis of both feet 08/29/2016  . Total knee replacement status, bilateral 08/29/2016  . Neuropathy (HCC) 08/29/2016  . Vitamin D deficiency 08/29/2016  . H/O splenectomy 08/29/2016  . Foot ulcer, right (HCC) 01/25/2014  . Acute exacerbation of chronic bronchitis (HCC) 10/17/2013  . Chronic cough 05/13/2011  . Bronchitis, chronic (HCC) 08/20/2010  . Nonallergic rhinitis 04/11/2010  . HYPERLIPIDEMIA-MIXED 07/30/2009  . EXOGENOUS OBESITY 12/02/2007  . Sleep apnea 12/02/2007  . Cardiomyopathy (HCC) 12/02/2007  . UNSPECIFIED VENOUS INSUFFICIENCY 12/02/2007  . RHINOSINUSITIS, ACUTE 12/02/2007  . GASTROESOPHAGEAL REFLUX DISEASE 12/02/2007  . ARTHRITIS 12/02/2007    Past Medical History:  Diagnosis Date  .  Arthritis   . Arthropathy, unspecified, site unspecified   . COPD (chronic obstructive pulmonary disease) (HCC)   . Esophageal reflux   . Obesity, unspecified   . Obstructive sleep apnea (adult) (pediatric)   .  Other acute sinusitis   . Other primary cardiomyopathies   . Psoriasis   . Shortness of breath   . Unspecified venous (peripheral) insufficiency     Family History  Problem Relation Age of Onset  . Prostate cancer Father   . Diabetes Paternal Grandfather    Past Surgical History:  Procedure Laterality Date  . ABDOMINAL HYSTERECTOMY  2011  . BLADDER SURGERY  2011   tac=ant/post  . IRRIGATION AND DEBRIDEMENT OF WOUND WITH SPLIT THICKNESS SKIN GRAFT Right 01/25/2014   Procedure: RIGHT FOOT IRRIGATION AND DEBRIDEMENT WITH ACELL/SPLICK THICKNESS SKINGRAFT WITH VAC;  Surgeon: Wayland Denis, DO;  Location: [facility];  Service: Plastics;  Laterality: Right;  . PELVIC FLOOR REPAIR    . REPLACEMENT TOTAL KNEE     rt and lt  . 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   Social History   Social History Narrative  . No narrative on file     Objective: Vital Signs: BP 134/80 (BP Location: Left Arm, Patient Position: Sitting, Cuff Size: Large)   Pulse 68   Resp 16   Ht 5\' 8"  (1.727 m)   Wt 266 lb (120.7 kg)   BMI 40.45 kg/m    Physical Exam  Constitutional: She is oriented to person, place, and time. She appears well-developed and well-nourished.  HENT:  Head: Normocephalic and atraumatic.  Eyes: Conjunctivae and EOM are normal.  Neck: Normal range of motion.  Cardiovascular: Normal rate, regular rhythm, normal heart sounds and intact distal pulses.   Pulmonary/Chest: Effort normal and breath sounds normal.  Abdominal: Soft. Bowel sounds are normal.  Lymphadenopathy:    She has no cervical adenopathy.  Neurological: She is alert and oriented to person, place, and time.  Skin: Skin is warm and dry. Capillary refill takes less than 2 seconds. Rash noted.  Psoriasis patches over elbows  Psychiatric: She has a normal mood and affect. Her behavior is normal.  Nursing note and vitals reviewed.    Musculoskeletal Exam: Patient is difficulty walking even with the help of  a nightly due to poor balance. She has marked kyphosis. Her shoulder joints, elbow joints, wrist joints, MCPs PIPs were good range of motion with no synovitis. She has prominence of bilateral DIP joints consistent with osteoarthritis with no inflammatory component. She has tenderness over left trochanteric area consistent with trochanteric bursitis. She has discomfort with range of motion of bilateral hip joints. She has bilateral total knee replacement without any effusion. Her right foot continues to have discomfort due to neuropathy. She has thickening of MTP joints consistent with osteoarthritis .     CDAI Exam: No CDAI exam completed.    Investigation: Findings:  Patient brought x-rays from Dr. Greig Right office today 1 view of her pelvis. It showed hip joint space was normal, SI joint space was normal bilaterally and there was some degenerative changes in the L4-5 and L5-S1 region.    Imaging: No results found.  Speciality Comments: No specialty comments available.    Procedures:  No procedures performed Allergies: Other; Penicillins; Sulfa antibiotics; and Sulfonamide derivatives   Assessment / Plan: Visit Diagnoses: Psoriasis: She has few scattered lesions on her elbows. She's been using some topical agents for that.  Primary osteoarthritis of both hands: Joint  protection and muscle strengthening was discussed.  Primary osteoarthritis of both feet: Proper fitting shoes were discussed.  Total knee replacement status, bilateral she has chronic discomfort.  Left trochanteric bursitis.: She continues to have discomfort walking due to left trochanteric bursa pain.  Neuropathy (HCC) - Feet: Her right foot has neuropathy secondary to previous burn.  Poor balance: Due to neuropathy in her foot and trochanteric bursitis she's having  issues. I will refer her to physical therapy for left trochanteric bursitis and lower extremity muscle strengthening.  Her other medical follow-up  problems are as follows, for which she's been seen by her PCP:  Vitamin D deficiency  Sleep apnea, unspecified type  Cardiomyopathy, unspecified type (HCC)  H/O splenectomy    Orders: Orders Placed This Encounter  Procedures  . Ambulatory referral to Physical Therapy    Face-to-face time spent with patient was 35 minutes. 50% of time was spent in counseling and coordination of care.  Follow-Up Instructions: Return in about 6 months (around 03/01/2017) for Osteoarthritis.    Pollyann Savoy, MD, Aileen Pilot

## 2016-12-01 ENCOUNTER — Telehealth: Payer: Self-pay | Admitting: Internal Medicine

## 2016-12-01 ENCOUNTER — Ambulatory Visit (INDEPENDENT_AMBULATORY_CARE_PROVIDER_SITE_OTHER): Payer: Medicare Other | Admitting: Acute Care

## 2016-12-01 ENCOUNTER — Encounter: Payer: Self-pay | Admitting: Acute Care

## 2016-12-01 ENCOUNTER — Ambulatory Visit (INDEPENDENT_AMBULATORY_CARE_PROVIDER_SITE_OTHER)
Admission: RE | Admit: 2016-12-01 | Discharge: 2016-12-01 | Disposition: A | Discharge status: Home / Self Care | Payer: Medicare Other | Source: Ambulatory Visit | Attending: Acute Care | Admitting: Acute Care

## 2016-12-01 VITALS — BP 128/88 | HR 69 | Ht 68.0 in | Wt 270.0 lb

## 2016-12-01 DIAGNOSIS — R05 Cough: Secondary | ICD-10-CM | POA: Diagnosis not present

## 2016-12-01 DIAGNOSIS — R059 Cough, unspecified: Secondary | ICD-10-CM

## 2016-12-01 DIAGNOSIS — J018 Other acute sinusitis: Secondary | ICD-10-CM

## 2016-12-01 DIAGNOSIS — J42 Unspecified chronic bronchitis: Secondary | ICD-10-CM | POA: Diagnosis not present

## 2016-12-01 DIAGNOSIS — J209 Acute bronchitis, unspecified: Secondary | ICD-10-CM | POA: Diagnosis not present

## 2016-12-01 LAB — POCT INFLUENZA A/B
INFLUENZA B, POC: NEGATIVE
Influenza A, POC: NEGATIVE

## 2016-12-01 MED ORDER — AZITHROMYCIN 250 MG PO TABS: 250.0000 mg | ORAL_TABLET | ORAL | 0 refills | Status: DC

## 2016-12-01 NOTE — Telephone Encounter (Signed)
Patient was diagnosed with cardiomyopathy and would like to know which decongestive medications she could take. Please call, thanks.

## 2016-12-01 NOTE — Telephone Encounter (Signed)
Pt scheduled for acute visit with SG for 12-01-16 @ 11:00. Pt aware & voiced her understanding. Nothing further needed.

## 2016-12-01 NOTE — Patient Instructions (Addendum)
It is nice to meet you today. Flu Swab today We will send in a prescription for Z-Pack Continue to rest and hydrate. Call cardiology office and ask which decongestant it is ok to take. Symptomatic relief. Nasal Irrigations with Neti Pott. Flonase 2 puffs in each nare once daily. Delsym 1 teaspoon every 12 hours for cough. Claritin 10 mg once daily at bedtime. Follow up with Dr. Maple Hudson in 2 weeks. Please contact office for sooner follow up if symptoms do not improve or worsen or seek emergency care

## 2016-12-01 NOTE — Progress Notes (Signed)
History of Present Illness Crystal Yoder is a 71 y.o. female former smoker followed for OSA,allergic rhinitis,COPD/ chronic cough,  complicated by hx of GERD, peripheral venous insufficiency. She is followed by Dr. Maple Hudson.   12/01/2016 Acute OV: Pt. Presents for an acute visit. She had a stomach virus 3 weeks ago. She states she rested and symptoms lasted 1 day, but she felt bad x 3-4 days. She started to get congested with cold like symptoms after the GI virus.. She has had cold symptoms x 2 weeks. Congestion in her right ear. Cough is non-productive. She is tired and congested. She denies fever, chest pain, orthopnea or hemoptysis.She states she has nasal congestion.She has been taking pseudoephedrine. 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 She cannot tell me the color of her nasal secretions as she does not look at them.She states she is not sleeping well as she is worrying about her adult son who is traveling across the country in a camper with his cat.  Tests Flu Swab:Negative for Influenza A and B.  CXR 12/01/2016: Pending  Past medical hx Past Medical History:  Diagnosis Date  . Arthritis   . Arthropathy, unspecified, site unspecified   . COPD (chronic obstructive pulmonary disease) (HCC)   . Esophageal reflux   . Obesity, unspecified   . Obstructive sleep apnea (adult) (pediatric)   . Other acute sinusitis   . Other primary cardiomyopathies   . Psoriasis   . Shortness of breath   . Unspecified venous (peripheral) insufficiency      Past surgical hx, Family hx, Social hx all reviewed.  Current Outpatient Prescriptions on File Prior to Visit  Medication Sig  . carvedilol (COREG) 3.125 MG tablet take 1 tablet by mouth twice a day with meals  . losartan (COZAAR) 50 MG tablet Take 1 tablet (50 mg total) by mouth daily.  . Multiple Vitamin (MULTIVITAMIN) tablet Take 1 tablet by mouth daily.  . pantoprazole (PROTONIX) 40 MG tablet Take  40 mg by mouth daily.   Marland Kitchen guaiFENesin (MUCINEX) 600 MG 12 hr tablet Take by mouth 2 (two) times daily.   No current facility-administered medications on file prior to visit.      Allergies  Allergen Reactions  . Other Other (See Comments)    Other Reaction: GI Upset  . Penicillins Hives  . Sulfa Antibiotics Other (See Comments)    Other Reaction: GI Upset  . Sulfonamide Derivatives Nausea And Vomiting    Review Of Systems:  Constitutional:   No  weight loss, night sweats,  Fevers, chills, +fatigue, or  lassitude.  HEENT:   No headaches,  Difficulty swallowing,  Tooth/dental problems, or  Sore throat,                No sneezing, itching, ear ache, +nasal congestion, +post nasal drip,   CV:  No chest pain,  Orthopnea, PND, swelling in lower extremities, anasarca, dizziness, palpitations, syncope.   GI  No heartburn, indigestion, abdominal pain, nausea, vomiting, diarrhea, change in bowel habits, loss of appetite, bloody stools.   Resp: No shortness of breath with exertion or at rest.  + excess mucus, no productive cough,  + non-productive cough,  No coughing up of blood.  + change in color of mucus.  No wheezing.  No chest wall deformity  Skin: no rash or lesions.  GU: no dysuria, change in color of urine, no urgency or frequency.  No flank pain, no  hematuria   MS:  No joint pain or swelling.  No decreased range of motion.  No back pain.  Psych:  No change in mood or affect. No depression or anxiety.  No memory loss.   Vital Signs BP 128/88 (BP Location: Left Arm, Cuff Size: Normal)   Pulse 69   Ht 5\' 8"  (1.727 m)   Wt 270 lb (122.5 kg)   SpO2 94%   BMI 41.05 kg/m    Physical Exam:  General- No distress,  A&Ox3, pleasant, appears tired ENT: No sinus tenderness, TM clear, edematous nasal mucosa, no oral exudate,+ post nasal drip, no  LAN Cardiac: S1, S2, regular rate and rhythm, no murmur Chest: No wheeze/ rales/ dullness; no accessory muscle use, no nasal flaring,  no sternal retractions Abd.: Soft Non-tender, obese Ext: No clubbing cyanosis, edema Neuro:  normal strength, walks with cane Skin: No rashes, warm and dry Psych: normal mood and behavior, tired and worried about her adult son.   Assessment/Plan  Acute exacerbation of chronic bronchitis Recent GI virus followed by cold like symptoms Afebrile Negative Flu Swab Plan: Flu Swab today We will send in a prescription for Z-Pack Continue to rest and hydrate. Call cardiology office and ask which decongestant it is ok to take. Symptomatic relief. Nasal Irrigations with Neti Pott. Flonase 2 puffs in each nare once daily. Delsym 1 teaspoon every 12 hours for cough. Claritin 10 mg once daily at bedtime. Follow up with Dr. Maple Hudson in 2 weeks. Please contact office for sooner follow up if symptoms do not improve or worsen or seek emergency care    RHINOSINUSITIS, ACUTE Sinus congestion x 3 weeks Flu Swab negative Plan: Z-pack Decongestant as tolerated Netty Pot sinus rinses Flonase 2 sprays per nare daily after sius clearing Claritin 10 mg once daily Delsym 1 teaspoon every 12 hours as needed for cough. Rest and hydration Follow up with Dr. Maple Hudson in 2 weeks Please contact office for sooner follow up if symptoms do not improve or worsen or seek emergency care       Bevelyn Ngo, NP 12/01/2016  12:18 PM

## 2016-12-01 NOTE — Assessment & Plan Note (Addendum)
Recent GI virus followed by cold like symptoms Afebrile Negative Flu Swab Plan: Flu Swab today We will send in a prescription for Z-Pack Continue to rest and hydrate. Call cardiology office and ask which decongestant it is ok to take. Symptomatic relief. Nasal Irrigations with Neti Pott. Flonase 2 puffs in each nare once daily. Delsym 1 teaspoon every 12 hours for cough. Claritin 10 mg once daily at bedtime. Follow up with Dr. Maple Hudson in 2 weeks. Please contact office for sooner follow up if symptoms do not improve or worsen or seek emergency care

## 2016-12-01 NOTE — Assessment & Plan Note (Addendum)
Sinus congestion x 3 weeks Flu Swab negative Plan: Z-pack Decongestant as tolerated Netty Pot sinus rinses Flonase 2 sprays per nare daily after sius clearing Claritin 10 mg once daily Delsym 1 teaspoon every 12 hours as needed for cough. Rest and hydration Follow up with Dr. Maple Hudson in 2 weeks Please contact office for sooner follow up if symptoms do not improve or worsen or seek emergency care

## 2016-12-02 NOTE — Telephone Encounter (Signed)
Called patient back and informed to avoid any decongestants.  Advised she could try Claritin or Zyrtec.  Nothing with "D".   Advised to run a humidifier and drink plenty of liquids, especially clear liquids.    Pt states that plain Claritin or Zyrtec will not help her congestion and and she is very congested, especially painful is the pressure in her ear.   I told her I would ask our RPH if she has any other recommendations, and would call her back, but in general cardiology does not recommend decongestants.

## 2016-12-02 NOTE — Telephone Encounter (Signed)
Spoke with pt and advised that I will check with Katie regarding appt time with CY for a 2 week f/u.  Katie , please advise on a appt time.

## 2016-12-02 NOTE — Telephone Encounter (Signed)
That is correct we normally do not recommend decongestants in cardiac patients. Saline nasal sprays can be used as much as needed and should help to clear some of the congestion. In addition usually ear pain is a sign of infection. It looks like she was seen yesterday for infection and started on an antibiotic. She should follow up with provider if not improved over the few days on antibiotic.

## 2016-12-02 NOTE — Telephone Encounter (Signed)
Informed patient of this information.  She thanked me for the call and information.

## 2016-12-08 ENCOUNTER — Telehealth: Payer: Self-pay | Admitting: Internal Medicine

## 2016-12-08 NOTE — Telephone Encounter (Signed)
New message    Pt calling stating that she has been congested for 3 weeks. She said being on heart medicine she cant take much, but she wanted to know if she could take some mucinex?

## 2016-12-08 NOTE — Telephone Encounter (Signed)
Informed pt ok to take plain Mucinex and just avoid the Mucinex D.  Pt verbalized understanding and was appreciative for call.

## 2016-12-09 NOTE — Telephone Encounter (Signed)
Dr. Maple Hudson please advise on where we can schedule pt. Thanks.

## 2016-12-10 NOTE — Telephone Encounter (Signed)
Pt can be seen on 12-15-16 at 11:15am slot. Thanks.

## 2016-12-10 NOTE — Telephone Encounter (Signed)
Spoke with pt. She has been scheduled to see CY on 12/15/16 at 11:15am. Nothing further was needed.

## 2016-12-15 ENCOUNTER — Ambulatory Visit (INDEPENDENT_AMBULATORY_CARE_PROVIDER_SITE_OTHER): Payer: Medicare Other | Admitting: Internal Medicine

## 2016-12-15 ENCOUNTER — Encounter: Payer: Self-pay | Admitting: Internal Medicine

## 2016-12-15 VITALS — BP 110/62 | HR 78 | Ht 68.0 in | Wt 270.0 lb

## 2016-12-15 DIAGNOSIS — E6609 Other obesity due to excess calories: Secondary | ICD-10-CM

## 2016-12-15 DIAGNOSIS — J069 Acute upper respiratory infection, unspecified: Secondary | ICD-10-CM | POA: Diagnosis not present

## 2016-12-15 DIAGNOSIS — J018 Other acute sinusitis: Secondary | ICD-10-CM

## 2016-12-15 DIAGNOSIS — J418 Mixed simple and mucopurulent chronic bronchitis: Secondary | ICD-10-CM | POA: Diagnosis not present

## 2016-12-15 DIAGNOSIS — H6980 Other specified disorders of Eustachian tube, unspecified ear: Secondary | ICD-10-CM

## 2016-12-15 DIAGNOSIS — G4733 Obstructive sleep apnea (adult) (pediatric): Secondary | ICD-10-CM

## 2016-12-15 DIAGNOSIS — IMO0001 Reserved for inherently not codable concepts without codable children: Secondary | ICD-10-CM

## 2016-12-15 MED ORDER — METHYLPREDNISOLONE ACETATE 80 MG/ML IJ SUSP
80.0000 mg | Freq: Once | INTRAMUSCULAR | Status: AC
  Administered 2016-12-15: 80 mg via INTRAMUSCULAR

## 2016-12-15 MED ORDER — PHENYLEPHRINE HCL 1 % NA SOLN
3.0000 [drp] | Freq: Once | NASAL | Status: AC
  Administered 2016-12-15: 3 [drp] via NASAL

## 2016-12-15 NOTE — Patient Instructions (Signed)
Order- neb neo nasal     Dx  Acute Upper respiratory infection, eustachian dysfunction              Depo 80  Try an otc ear wax kit like Cerumenex   Ok to use otc Afrin decongestant nasal spray   1 puff each nostril  Once or twice daily for not more than 3 days, if needed

## 2016-12-15 NOTE — Progress Notes (Signed)
Patient ID: Crystal Yoder, female    DOB: Mar 20, 1946, 71 y.o.   MRN: [id number]   HPI F former smoker followed for OSA/ failed CPAP, allergic rhinitis, COPD/ chronic cough,  complicated by hx of GERD, peripheral venous insufficiency, OHS, Asplenia, CM/CHF, psoriasis NPSG 05/03/00 [place]- AHI 20.9, incomplete control on BIPAP 13/7, body weight 237 lbs PFT 2012-mild obstruction small airways, DLCO 54% Echocardiogram 08/27/2016-EF 40%-45%, , gr 1 diastolic dysfunction, chamber dilatation, PA peak 47 mmHg FENO 08/29/16  6- WNL -----------------------------------------------------------------------------------  08/29/2016-71 year old female former smoker followed for COPD/chronic cough, allergic rhinitis, OSA/failed CPAP, complicated by GERD, peripheral venous insufficiency, obesity, OHS, Asplenia FOLLOWS FOR: congestion x1 month, some cough and wheezing, sinus pressure/congestion, bilateral ear congestion, hoarseness.  denies any f/c/s, hemoptysis, chest pain No specific sick exposure. No fever. Cough usually dry. It occasionally wakes her. Scant sputum has been white. Sinus and ear pressure/congestion bothers her more than her chest now. She had called her insurance company nurse to ask whether it would be okay to get a flu shot with these protracted symptoms. Because of her asplenia and cardiomyopathy, she was told to see a physician. PFT 2012-mild obstruction small airways, DLCO 54% Echocardiogram 08/27/2016-EF 40%-45%, , gr 1 diastolic dysfunction, chamber dilatation, PA peak 47 mmHg FENO 08/29/16  6- WNL  12/15/2016 -71 year old female former smoker followed for COPD/chronic cough, allergic rhinitis, OSA/failed CPAP, complicated by GERD, peripheral venous insufficiency, obesity, OHS, Asplenia, CM/CHF, psoriasis LOV 12/01/16- NP- AEChronic Bronchitis> Zpak, plain Mucinex   Cardiology wanted to avoid decongestants. CXR 12/01/16-  NAD FOLLOWS FOR:Pt states she continues to have cough-deep and non  productive, wheezing at times, nasal congestion and fullness in ears-not much better since being seen by SG on 12/01/16 270 lbs today GI virus one month ago then a bad cold. Still much head congestion and ear pressure. Nasal stuffiness but no fever or purulent discharge. Minimal residual dry cough.  Review of Systems-see HPI    + = pos Constitutional:   No-   weight loss, night sweats, fevers, chills, + fatigue, lassitude. HEENT:   No-   headaches, difficulty swallowing, tooth/dental problems, sore throat,                  Little-   sneezing, itching, ear ache, +nasal congestion, post nasal drip,  CV:  No-   chest pain, orthopnea, PND, swelling in lower extremities, anasarca, dizziness, palpitations GI:  No-   heartburn, indigestion, abdominal pain, nausea, vomiting, diarrhea,                 change in bowel habits, loss of appetite Resp: + shortness of breath with exertion or at rest.  No-  excess mucus,             No-  coughing up of blood.  + Persistent dry cough           change in color of mucus.  No- wheezing.   Skin: No-   rash or lesions. GU: No-   dysuria,  MS:  No-acute  joint pain or swelling. Psych:  No- change in mood or affect. No depression or anxiety.  No memory loss.  Objective:   Physical Exam  General- Alert, Oriented, Affect-appropriate, Distress- none acute    + Obese,  Skin- rash-none, lesions- none, excoriation- none Lymphadenopathy- none Head- atraumatic            Eyes- Gross vision intact, PERRLA, conjunctivae clear secretions  Ears- Hearing, canals + cerumen bilaterally            Nose- +sounds stuffy, No-Septal dev, mucus, polyps, erosion, perforation             Throat- Mallampati III-IV , mucosa clear , drainage- none, tonsils- atrophic. + horse Neck- flexible , trachea midline, no stridor , thyroid nl, carotid no bruit Chest - symmetrical excursion , unlabored           Heart/CV- RRR , no murmur , no gallop  , no rub, nl s1 s2                            - JVD- none , edema+ trace, stasis changes- none, varices- none           Lung-  Wheeze-None, cough-none; no cough with deep breath , dullness-none, rub- none           Chest wall-  Abd-  Br/ Gen/ Rectal- Not done, not indicated Extrem- cyanosis- none, clubbing, none, atrophy- none, strength- nl.  Superficial varices.  Neuro- grossly intact to observation

## 2016-12-21 NOTE — Assessment & Plan Note (Signed)
Acute exacerbation associated with a viral respiratory infection, now slowly resolving but with residual upper airway symptoms. Depo-Medrol injection may help this.

## 2016-12-21 NOTE — Assessment & Plan Note (Signed)
Sustained symptoms suggesting complete clearing of a bacterial pansinusitis. This may just be recurrent upper respiratory infection with eustachian dysfunction aggravated by cerumen retention. Plan-nasal nebulizer decongestant treatment, Depo-Medrol, Sudafed, ear wax kit

## 2016-12-21 NOTE — Assessment & Plan Note (Signed)
Consider for bariatric referral

## 2016-12-21 NOTE — Assessment & Plan Note (Signed)
She has gained weight since her original sleep study but failed to tolerate multiple efforts with CPAP/BiPAP and wanted to focus on weight loss.

## 2017-02-14 ENCOUNTER — Other Ambulatory Visit: Payer: Self-pay | Admitting: Internal Medicine

## 2017-02-24 ENCOUNTER — Other Ambulatory Visit: Payer: Self-pay | Admitting: Internal Medicine

## 2017-03-27 ENCOUNTER — Ambulatory Visit (INDEPENDENT_AMBULATORY_CARE_PROVIDER_SITE_OTHER): Payer: Medicare Other | Admitting: Internal Medicine

## 2017-03-27 ENCOUNTER — Encounter (INDEPENDENT_AMBULATORY_CARE_PROVIDER_SITE_OTHER): Payer: Self-pay

## 2017-03-27 ENCOUNTER — Encounter: Payer: Self-pay | Admitting: Internal Medicine

## 2017-03-27 VITALS — BP 132/68 | HR 69 | Ht 68.0 in | Wt 265.8 lb

## 2017-03-27 DIAGNOSIS — E782 Mixed hyperlipidemia: Secondary | ICD-10-CM

## 2017-03-27 NOTE — Progress Notes (Signed)
Cardiology Office Note   Date:  03/27/2017   ID:  Crystal Yoder, DOB October 27, 1946, MRN [id number]  PCP:  Laurann Montana, MD  Cardiologist:   Dietrich Pates, MD   F/U of NICM     History of Present Illness: Crystal Yoder is a 71 y.o. female with a history of HTN NICM (LVEF 45 to 50%) GERD, HL, reactive airway dz  I saw her in Oct  Complained of more fatigue at that time   Echo was unchanged LVEF 45%  Since seen the pt says her breathing is stable  Denies CP  No edema   Pt does have dry cough  She is under signif increased stress  Electricity is turned off in home  Home may be foreclosed on    Current Meds  Medication Sig  . Ascorbic Acid (VITAMIN C PO) Take by mouth daily.  . carvedilol (COREG) 3.125 MG tablet take 1 tablet by mouth twice a day with meals  . Cholecalciferol (VITAMIN D PO) Take by mouth daily.  Marland Kitchen losartan (COZAAR) 50 MG tablet TAKE 1 TABLET BY MOUTH DAILY  . Multiple Vitamin (MULTIVITAMIN) tablet Take 1 tablet by mouth daily.  . pantoprazole (PROTONIX) 40 MG tablet Take 40 mg by mouth daily.      Allergies:   Other; Penicillins; Sulfa antibiotics; and Sulfonamide derivatives   Past Medical History:  Diagnosis Date  . Arthritis   . Arthropathy, unspecified, site unspecified   . COPD (chronic obstructive pulmonary disease) (HCC)   . Esophageal reflux   . Obesity, unspecified   . Obstructive sleep apnea (adult) (pediatric)   . Other acute sinusitis   . Other primary cardiomyopathies   . Psoriasis   . Shortness of breath   . Unspecified venous (peripheral) insufficiency     Past Surgical History:  Procedure Laterality Date  . ABDOMINAL HYSTERECTOMY  2011  . BLADDER SURGERY  2011   tac=ant/post  . IRRIGATION AND DEBRIDEMENT OF WOUND WITH SPLIT THICKNESS SKIN GRAFT Right 01/25/2014   Procedure: RIGHT FOOT IRRIGATION AND DEBRIDEMENT WITH ACELL/SPLICK THICKNESS SKINGRAFT WITH VAC;  Surgeon: Wayland Denis, DO;  Location: [facility];  Service:  Plastics;  Laterality: Right;  . PELVIC FLOOR REPAIR    . REPLACEMENT TOTAL KNEE     rt and lt  . 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 Her smoking use included Cigarettes. She has a 5.00 pack-year smoking history. She has never used smokeless tobacco. She reports that she drinks alcohol. She reports that she does not use drugs.   Family History:  The patient's family history includes Diabetes in her paternal grandfather; Prostate cancer in her father.    ROS:  Please see the history of present illness. All other systems are reviewed and  Negative to the above problem except as noted.    PHYSICAL EXAM: VS:  BP 132/68   Pulse 69   Ht 5\' 8"  (1.727 m)   Wt 120.6 kg (265 lb 12.8 oz)   BMI 40.41 kg/m   GEN: Well nourished, well developed, in no acute distress  HEENT: normal  Neck: no JVD, carotid bruits, or masses Cardiac: RRR; no murmurs, rubs, or gallops,no edema  Respiratory:  clear to auscultation bilaterally, normal work of breathing GI: soft, nontender, nondistended, + BS  No hepatomegaly  MS: no deformity Moving all extremities   Skin: warm and  dry, no rash Neuro:  Strength and sensation are intact Psych: euthymic mood, full affect   EKG:  EKG is ordered today.SR 69 bpm  LVH w repol abnormalitiy     Lipid Panel    Component Value Date/Time   CHOL 201 (H) 10/31/2013 1440   TRIG 42.0 10/31/2013 1440   TRIG 75 10/23/2006 0812   HDL 67.80 10/31/2013 1440   CHOLHDL 3 10/31/2013 1440   VLDL 8.4 10/31/2013 1440   LDLCALC 98 03/02/2012 1010   LDLDIRECT 121.5 10/31/2013 1440      Wt Readings from Last 3 Encounters:  03/27/17 120.6 kg (265 lb 12.8 oz)  12/15/16 122.5 kg (270 lb)  12/01/16 122.5 kg (270 lb)      ASSESSMENT AND PLAN:  1  NICM  Mild LV dysfunciton on echo  I would keep on current regimen   2  HTN  BP is OK  Keep on same regimen.  3  Pulmonary  Follows with C  Young  Pt concerned about possibilites of throat cancer I have told her her symptoms are nonspecific  May be related to allergies  Ultimately should discuss with C Young.    4  Psychosocial  Pt under signif increased stress today  COmes in with husband  Very angry about situation of home, loss of home  Bitter  I recomm counselling  Given name for D Gutterman  F/U in clinic next winter      Current medicines are reviewed at length with the patient today.  The patient does not have concerns regarding medicines.  Signed, Dietrich Pates, MD  03/27/2017 11:17 AM    [facility] [address] [place], [place], [place]  [address] Phone: [phone]; Fax: [phone]

## 2017-03-27 NOTE — Patient Instructions (Signed)
Your physician recommends that you continue on your current medications as directed. Please refer to the Current Medication list given to you today. Your physician wants you to follow-up in: 6 months with Dr. Ross.  You will receive a reminder letter in the mail two months in advance. If you don't receive a letter, please call our office to schedule the follow-up appointment.  

## 2017-05-28 ENCOUNTER — Ambulatory Visit (INDEPENDENT_AMBULATORY_CARE_PROVIDER_SITE_OTHER): Payer: Medicare Other | Admitting: Internal Medicine

## 2017-05-28 ENCOUNTER — Encounter: Payer: Self-pay | Admitting: Internal Medicine

## 2017-05-28 DIAGNOSIS — J42 Unspecified chronic bronchitis: Secondary | ICD-10-CM | POA: Diagnosis not present

## 2017-05-28 DIAGNOSIS — K219 Gastro-esophageal reflux disease without esophagitis: Secondary | ICD-10-CM | POA: Diagnosis not present

## 2017-05-28 DIAGNOSIS — G4733 Obstructive sleep apnea (adult) (pediatric): Secondary | ICD-10-CM

## 2017-05-28 DIAGNOSIS — J209 Acute bronchitis, unspecified: Secondary | ICD-10-CM

## 2017-05-28 MED ORDER — DOXYCYCLINE HYCLATE 100 MG PO TABS: 100.0000 mg | ORAL_TABLET | Freq: Two times a day (BID) | ORAL | 0 refills | Status: DC

## 2017-05-28 MED ORDER — AZELASTINE-FLUTICASONE 137-50 MCG/ACT NA SUSP: 2.0000 | Freq: Every day | NASAL | 0 refills | Status: DC

## 2017-05-28 NOTE — Assessment & Plan Note (Signed)
We have emphasized sleep hygiene and encouraged weight loss. She has not wanted to return to PA P therapy or an oral appliance. She may be willing to repeat a sleep study in the future and might be a candidate for alternative therapies based on clinical judgment at that time.

## 2017-05-28 NOTE — Progress Notes (Signed)
Patient ID: Crystal Yoder, female    DOB: [birth year], 71 y.o.   MRN: [id number]   HPI F former smoker followed for OSA/ failed CPAP, allergic rhinitis, COPD/ chronic cough,  complicated by hx of GERD, peripheral venous insufficiency, OHS, Asplenia, CM/CHF, psoriasis NPSG 05/03/00 [place]- AHI 20.9, incomplete control on BIPAP 13/7, body weight 237 lbs PFT 2012-mild obstruction small airways, DLCO 54% Echocardiogram 08/27/2016-EF 40%-45%, , gr 1 diastolic dysfunction, chamber dilatation, PA peak 47 mmHg FENO 08/29/16  6- WNL -----------------------------------------------------------------------------------  08/29/2016-71 year old female former smoker followed for COPD/chronic cough, allergic rhinitis, OSA/failed CPAP, complicated by GERD, peripheral venous insufficiency, obesity, OHS, Asplenia FOLLOWS FOR: congestion x1 month, some cough and wheezing, sinus pressure/congestion, bilateral ear congestion, hoarseness.  denies any f/c/s, hemoptysis, chest pain No specific sick exposure. No fever. Cough usually dry. It occasionally wakes her. Scant sputum has been white. Sinus and ear pressure/congestion bothers her more than her chest now. She had called her insurance company nurse to ask whether it would be okay to get a flu shot with these protracted symptoms. Because of her asplenia and cardiomyopathy, she was told to see a physician. PFT 2012-mild obstruction small airways, DLCO 54% Echocardiogram 08/27/2016-EF 40%-45%, , gr 1 diastolic dysfunction, chamber dilatation, PA peak 47 mmHg FENO 08/29/16  6- WNL  12/15/2016 -71 year old female former smoker followed for COPD/chronic cough, allergic rhinitis, OSA/failed CPAP, complicated by GERD, peripheral venous insufficiency, obesity, OHS, Asplenia, CM/CHF, psoriasis LOV 12/01/16- NP- AEChronic Bronchitis> Zpak, plain Mucinex   Cardiology wanted to avoid decongestants. CXR 12/01/16-  NAD FOLLOWS FOR:Pt states she continues to have cough-deep and non  productive, wheezing at times, nasal congestion and fullness in ears-not much better since being seen by SG on 12/01/16 270 lbs today GI virus one month ago then a bad cold. Still much head congestion and ear pressure. Nasal stuffiness but no fever or purulent discharge. Minimal residual dry cough.  05/28/17- 71 year old female former smoker followed for COPD/chronic cough, allergic rhinitis, OSA/failed CPAP, complicated by GERD, peripheral venous insufficiency, obesity, OHS, Asplenia, CM/CHF, psoriasis ACUTE VISIT:Pt states she continues to have congestion and cough-slightly better since June but stays fatigued. Pt was having hoarsness, fullness in ears, and sneezing. Also has clammy feelings and real low grade chills.  Onset cough and malaise in late June treated by [facility] with Z-Pak. Some mild headache not aggravated by leaning over and no nasal discharge. Some sense of postnasal drip. ENT evaluation/Dr. Ezzard Standing for hoarseness-treated with Flonase. Going to beach soon and hopes that atmosphere will be better for her than all the rain we have been having here.  Review of Systems-see HPI    + = pos Constitutional:   No-   weight loss, night sweats, fevers, chills, + fatigue, lassitude. HEENT:   No-   headaches, difficulty swallowing, tooth/dental problems, sore throat,                  Little-   sneezing, itching, ear ache, +nasal congestion, post nasal drip,  CV:  No-   chest pain, orthopnea, PND, swelling in lower extremities, anasarca, dizziness, palpitations GI:  No-   heartburn, indigestion, abdominal pain, nausea, vomiting, diarrhea,                 change in bowel habits, loss of appetite Resp: + shortness of breath with exertion or at rest.  No-  excess mucus,             No-  coughing up of blood.  +  Persistent dry cough           change in color of mucus.  No- wheezing.   Skin: No-   rash or lesions. GU: No-   dysuria,  MS:  No-acute  joint pain or swelling. Psych:  No- change in mood or  affect. + depression or anxiety.  No memory loss.  Objective:   Physical Exam  General- Alert, Oriented, Affect-appropriate, Distress- none acute    + Obese,  Skin- rash-none, lesions- none, excoriation- none Lymphadenopathy- none Head- atraumatic            Eyes- Gross vision intact, PERRLA, conjunctivae clear secretions            Ears- Hearing, canals + cerumen bilaterally            Nose- +sounds stuffy, No-Septal dev, mucus, polyps, erosion, perforation             Throat- Mallampati III-IV , mucosa clear , drainage- none, tonsils- atrophic. + horse Neck- flexible , trachea midline, no stridor , thyroid nl, carotid no bruit Chest - symmetrical excursion , unlabored           Heart/CV- RRR , no murmur , no gallop  , no rub, nl s1 s2                           - JVD- none , edema+ trace, stasis changes- none, varices- none           Lung-  Wheeze-None, cough-none; no cough with deep breath , dullness-none, rub- none           Chest wall-  Abd-  Br/ Gen/ Rectal- Not done, not indicated Extrem- cyanosis- none, clubbing, none, atrophy- none, strength- nl.  Superficial varices.  Neuro- grossly intact to observation

## 2017-05-28 NOTE — Patient Instructions (Addendum)
Sample Dymista nasal spray     1-2 puffs each nostril once daily at bedtime   Try this instead of Flonase/ fluticasone for comparison.  To avoid feelings of tiredness from diphenhydramine/ Benadryl, try otc loratadine/ Claritin as an antihistamine for allergy ( for nasal congestion, drainage, sneezing).  I would be interested in knowing if your white blood count is elevated, which might suggest you could be fighting a low-grade bacterial infection. This would be checked with a CBC (complete blood count) which was likely drawn at your last physical exam.  Script printed tor doxycycline antibiotic to hold in case needed

## 2017-05-28 NOTE — Assessment & Plan Note (Signed)
Cough history suggests rhinitis and mild bronchitis. I asked her to check if her PCP drew a CBC with recent physical exam lab work. Plan-Dymista for possible postnasal drip component. Could try Claritin as less sedating than diphenhydramine. Prescription to hold for doxycycline in case needed while on vacation.

## 2017-05-28 NOTE — Assessment & Plan Note (Signed)
Continue to emphasize reflux precautions. At least intermittent reflux may be a component of her cough as discussed.

## 2017-05-30 ENCOUNTER — Inpatient Hospital Stay (HOSPITAL_COMMUNITY)
Admission: EM | Admit: 2017-05-30 | Discharge: 2017-06-06 | DRG: 417 | Disposition: A | Discharge status: Home Health | Payer: Medicare Other | Attending: Internal Medicine | Admitting: Internal Medicine

## 2017-05-30 ENCOUNTER — Encounter (HOSPITAL_COMMUNITY): Payer: Self-pay | Admitting: Emergency Medicine

## 2017-05-30 ENCOUNTER — Emergency Department (HOSPITAL_COMMUNITY): Payer: Medicare Other

## 2017-05-30 DIAGNOSIS — Z882 Allergy status to sulfonamides status: Secondary | ICD-10-CM

## 2017-05-30 DIAGNOSIS — Z880 Allergy status to penicillin: Secondary | ICD-10-CM

## 2017-05-30 DIAGNOSIS — R0902 Hypoxemia: Secondary | ICD-10-CM | POA: Diagnosis present

## 2017-05-30 DIAGNOSIS — I110 Hypertensive heart disease with heart failure: Secondary | ICD-10-CM | POA: Diagnosis present

## 2017-05-30 DIAGNOSIS — R933 Abnormal findings on diagnostic imaging of other parts of digestive tract: Secondary | ICD-10-CM

## 2017-05-30 DIAGNOSIS — Z96653 Presence of artificial knee joint, bilateral: Secondary | ICD-10-CM | POA: Diagnosis present

## 2017-05-30 DIAGNOSIS — J9811 Atelectasis: Secondary | ICD-10-CM | POA: Diagnosis not present

## 2017-05-30 DIAGNOSIS — Z9081 Acquired absence of spleen: Secondary | ICD-10-CM

## 2017-05-30 DIAGNOSIS — K8000 Calculus of gallbladder with acute cholecystitis without obstruction: Secondary | ICD-10-CM | POA: Diagnosis present

## 2017-05-30 DIAGNOSIS — I429 Cardiomyopathy, unspecified: Secondary | ICD-10-CM | POA: Diagnosis present

## 2017-05-30 DIAGNOSIS — K8012 Calculus of gallbladder with acute and chronic cholecystitis without obstruction: Principal | ICD-10-CM | POA: Diagnosis present

## 2017-05-30 DIAGNOSIS — Z6839 Body mass index (BMI) 39.0-39.9, adult: Secondary | ICD-10-CM

## 2017-05-30 DIAGNOSIS — B9620 Unspecified Escherichia coli [E. coli] as the cause of diseases classified elsewhere: Secondary | ICD-10-CM | POA: Diagnosis present

## 2017-05-30 DIAGNOSIS — I10 Essential (primary) hypertension: Secondary | ICD-10-CM | POA: Diagnosis present

## 2017-05-30 DIAGNOSIS — N3000 Acute cystitis without hematuria: Secondary | ICD-10-CM

## 2017-05-30 DIAGNOSIS — K838 Other specified diseases of biliary tract: Secondary | ICD-10-CM

## 2017-05-30 DIAGNOSIS — Z87891 Personal history of nicotine dependence: Secondary | ICD-10-CM

## 2017-05-30 DIAGNOSIS — K219 Gastro-esophageal reflux disease without esophagitis: Secondary | ICD-10-CM | POA: Diagnosis present

## 2017-05-30 DIAGNOSIS — Z7951 Long term (current) use of inhaled steroids: Secondary | ICD-10-CM

## 2017-05-30 DIAGNOSIS — I872 Venous insufficiency (chronic) (peripheral): Secondary | ICD-10-CM | POA: Diagnosis present

## 2017-05-30 DIAGNOSIS — R1032 Left lower quadrant pain: Secondary | ICD-10-CM

## 2017-05-30 DIAGNOSIS — J309 Allergic rhinitis, unspecified: Secondary | ICD-10-CM | POA: Diagnosis present

## 2017-05-30 DIAGNOSIS — Z419 Encounter for procedure for purposes other than remedying health state, unspecified: Secondary | ICD-10-CM

## 2017-05-30 DIAGNOSIS — J449 Chronic obstructive pulmonary disease, unspecified: Secondary | ICD-10-CM | POA: Diagnosis present

## 2017-05-30 DIAGNOSIS — I428 Other cardiomyopathies: Secondary | ICD-10-CM

## 2017-05-30 DIAGNOSIS — G4733 Obstructive sleep apnea (adult) (pediatric): Secondary | ICD-10-CM | POA: Diagnosis present

## 2017-05-30 DIAGNOSIS — K810 Acute cholecystitis: Secondary | ICD-10-CM | POA: Diagnosis present

## 2017-05-30 DIAGNOSIS — I5033 Acute on chronic diastolic (congestive) heart failure: Secondary | ICD-10-CM | POA: Diagnosis present

## 2017-05-30 HISTORY — DX: Essential (primary) hypertension: I10

## 2017-05-30 LAB — COMPREHENSIVE METABOLIC PANEL
ALT: 14 U/L (ref 14–54)
ANION GAP: 9 (ref 5–15)
AST: 17 U/L (ref 15–41)
Albumin: 4.1 g/dL (ref 3.5–5.0)
Alkaline Phosphatase: 65 U/L (ref 38–126)
BUN: 18 mg/dL (ref 6–20)
CO2: 24 mmol/L (ref 22–32)
Calcium: 8.9 mg/dL (ref 8.9–10.3)
Chloride: 108 mmol/L (ref 101–111)
Creatinine, Ser: 0.77 mg/dL (ref 0.44–1.00)
GFR calc non Af Amer: >60 mL/min (ref >60)
GLUCOSE: 141 mg/dL — AB (ref 65–99)
POTASSIUM: 4.6 mmol/L (ref 3.5–5.1)
SODIUM: 141 mmol/L (ref 135–145)
TOTAL PROTEIN: 7.8 g/dL (ref 6.5–8.1)
Total Bilirubin: 0.4 mg/dL (ref 0.3–1.2)

## 2017-05-30 LAB — CBC
HEMATOCRIT: 41.8 % (ref 36.0–46.0)
HEMOGLOBIN: 13.2 g/dL (ref 12.0–15.0)
MCH: 29.5 pg (ref 26.0–34.0)
MCHC: 31.6 g/dL (ref 30.0–36.0)
MCV: 93.3 fL (ref 78.0–100.0)
Platelets: 211 10*3/uL (ref 150–400)
RBC: 4.48 MIL/uL (ref 3.87–5.11)
RDW: 14.4 % (ref 11.5–15.5)
WBC: 12.4 10*3/uL — ABNORMAL HIGH (ref 4.0–10.5)

## 2017-05-30 LAB — LIPASE, BLOOD: Lipase: 24 U/L (ref 11–51)

## 2017-05-30 MED ORDER — IOPAMIDOL (ISOVUE-300) INJECTION 61%
100.0000 mL | Freq: Once | INTRAVENOUS | Status: AC | PRN
  Administered 2017-05-31: 100 mL via INTRAVENOUS

## 2017-05-30 MED ORDER — SODIUM CHLORIDE 0.9 % IV BOLUS (SEPSIS)
500.0000 mL | Freq: Once | INTRAVENOUS | Status: AC
  Administered 2017-05-31: 500 mL via INTRAVENOUS

## 2017-05-30 MED ORDER — FENTANYL CITRATE (PF) 100 MCG/2ML IJ SOLN
50.0000 ug | Freq: Once | INTRAMUSCULAR | Status: AC
  Administered 2017-05-31: 50 ug via INTRAVENOUS
  Filled 2017-05-30: qty 2

## 2017-05-30 MED ORDER — ONDANSETRON HCL 4 MG/2ML IJ SOLN
4.0000 mg | Freq: Once | INTRAMUSCULAR | Status: AC
  Administered 2017-05-31: 4 mg via INTRAVENOUS
  Filled 2017-05-30: qty 2

## 2017-05-30 MED ORDER — IOPAMIDOL (ISOVUE-300) INJECTION 61%
INTRAVENOUS | Status: AC
  Filled 2017-05-30: qty 100

## 2017-05-30 MED ORDER — SODIUM CHLORIDE 0.9 % IV BOLUS (SEPSIS)
1000.0000 mL | Freq: Once | INTRAVENOUS | Status: AC
  Administered 2017-05-31: 1000 mL via INTRAVENOUS

## 2017-05-30 NOTE — ED Notes (Signed)
Pt c/o abd pain and weakness onset around 16:00 today. Family hx of diverticulitis. Pt had a similar pain a few weeks ago that eventually subsided after taking colase.

## 2017-05-30 NOTE — ED Provider Notes (Signed)
[facility]-EMERGENCY DEPT Provider Note   CSN: [id number] Arrival date & time: 05/30/17  1940  Time seen 23:30 PM    History   Chief Complaint Chief Complaint  Patient presents with  . Abdominal Pain    HPI Crystal Yoder is a 71 y.o. female.  HPI  patient states about 4 PM this afternoon after eating popcorn which she's done many times in the past she started getting diffuse "excruciating" abdominal pain that is constant that she states moves around from location to location. She states she feels bloated. She denies nausea, vomiting, diarrhea or fever. She states her last BM was about 3 PM and was small. She states she had similar pain a few weeks ago that lasted from about 2 AM in the morning until 8 AM and she took Colace and had a bowel movement and it seemed to get better. She is unsure if she's been having melena or blood in her stool. 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 She reports her father had a history of diverticulitis.  PCP Laurann Montana, MD   Past Medical History:  Diagnosis Date  . Arthritis   . Arthropathy, unspecified, site unspecified   . COPD (chronic obstructive pulmonary disease) (HCC)   . Esophageal reflux   . Obesity, unspecified   . Obstructive sleep apnea (adult) (pediatric)   . Other acute sinusitis   . Other primary cardiomyopathies   . Psoriasis   . Shortness of breath   . Unspecified venous (peripheral) insufficiency     Patient Active Problem List   Diagnosis Date Noted  . Abdominal pain 05/31/2017  . Psoriasis 08/29/2016  . Primary osteoarthritis of both hands 08/29/2016  . Primary osteoarthritis of both feet 08/29/2016  . Total knee replacement status, bilateral 08/29/2016  . Neuropathy 08/29/2016  . Vitamin D deficiency 08/29/2016  . H/O splenectomy 08/29/2016  . Foot ulcer, right (HCC) 01/25/2014  . Acute exacerbation of chronic  bronchitis (HCC) 10/17/2013  . Chronic cough 05/13/2011  . Bronchitis, chronic (HCC) 08/20/2010  . Nonallergic rhinitis 04/11/2010  . Hyperlipidemia 07/30/2009  . Obesity 12/02/2007  . Obstructive sleep apnea 12/02/2007  . Cardiomyopathy (HCC) 12/02/2007  . UNSPECIFIED VENOUS INSUFFICIENCY 12/02/2007  . RHINOSINUSITIS, ACUTE 12/02/2007  . GASTROESOPHAGEAL REFLUX DISEASE 12/02/2007  . ARTHRITIS 12/02/2007    Past Surgical History:  Procedure Laterality Date  . ABDOMINAL HYSTERECTOMY  2011  . BLADDER SURGERY  2011   tac=ant/post  . IRRIGATION AND DEBRIDEMENT OF WOUND WITH SPLIT THICKNESS SKIN GRAFT Right 01/25/2014   Procedure: RIGHT FOOT IRRIGATION AND DEBRIDEMENT WITH ACELL/SPLICK THICKNESS SKINGRAFT WITH VAC;  Surgeon: Wayland Denis, DO;  Location: [facility];  Service: Plastics;  Laterality: Right;  . PELVIC FLOOR REPAIR    . REPLACEMENT TOTAL KNEE     rt and lt  . 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    OB History    No data available       Home Medications    Prior to Admission medications   Medication Sig Start Date End Date Taking? Authorizing Provider  Ascorbic Acid (VITAMIN C PO) Take by mouth daily.    [provider]  Azelastine-Fluticasone (DYMISTA) 137-50 MCG/ACT SUSP Place 2 sprays into both nostrils at bedtime. 05/28/17   Jetty Duhamel D, MD  carvedilol (COREG) 3.125 MG tablet take 1 tablet by mouth twice a day with meals 02/25/17  Pricilla Riffle, MD  Cholecalciferol (VITAMIN D PO) Take by mouth daily.    [provider]  doxycycline (VIBRA-TABS) 100 MG tablet Take 1 tablet (100 mg total) by mouth 2 (two) times daily. 05/28/17   Waymon Budge, MD  fluticasone (FLONASE) 50 MCG/ACT nasal spray Place 2 sprays into both nostrils daily. 05/07/17   [provider]  losartan (COZAAR) 50 MG tablet TAKE 1 TABLET BY MOUTH DAILY 02/16/17   Pricilla Riffle, MD  Multiple Vitamin (MULTIVITAMIN) tablet Take 1 tablet by mouth daily.     [provider]  pantoprazole (PROTONIX) 40 MG tablet Take 40 mg by mouth daily.  09/27/13   [provider]    Family History Family History  Problem Relation Age of Onset  . Prostate cancer Father   . Diabetes Paternal Grandfather     Social History Social History  Substance Use Topics  . Smoking status: Former Smoker    Packs/day: 0.50    Years: 10.00    Types: Cigarettes    Quit date: 10/28/1979  . Smokeless tobacco: Never Used  . Alcohol use Yes     Comment: rare  lives at home Drinks wine occassionaly   Allergies   Other; Penicillins; Sulfa antibiotics; and Sulfonamide derivatives   Review of Systems Review of Systems  All other systems reviewed and are negative.    Physical Exam Updated Vital Signs BP (!) 163/98 (BP Location: Left Arm)   Pulse (!) 56   Temp 98.1 F (36.7 C) (Oral)   Resp 15   Ht 5\' 9"  (1.753 m)   Wt 120.2 kg (265 lb)   SpO2 98%   BMI 39.13 kg/m   Vital signs normal except for bradycardia   Physical Exam  Constitutional: She is oriented to person, place, and time. She appears well-developed and well-nourished.  Non-toxic appearance. She does not appear ill. She appears distressed.  HENT:  Head: Normocephalic and atraumatic.  Right Ear: External ear normal.  Left Ear: External ear normal.  Nose: Nose normal. No mucosal edema or rhinorrhea.  Mouth/Throat: Oropharynx is clear and moist and mucous membranes are normal. No dental abscesses or uvula swelling.  Eyes: Pupils are equal, round, and reactive to light. Conjunctivae and EOM are normal.  Neck: Normal range of motion and full passive range of motion without pain. Neck supple.  Cardiovascular: Normal rate, regular rhythm and normal heart sounds.  Exam reveals no gallop and no friction rub.   No murmur heard. Pulmonary/Chest: Effort normal and breath sounds normal. No respiratory distress. She has no wheezes. She has no rhonchi. She has no rales. She exhibits no  tenderness and no crepitus.  Abdominal: Soft. Normal appearance and bowel sounds are normal. She exhibits distension. There is no tenderness. There is no rebound and no guarding.    Patient has mild tenderness left upper quadrant but is most tender in the left lower quadrant.  Musculoskeletal: Normal range of motion. She exhibits no edema or tenderness.  Moves all extremities well.   Neurological: She is alert and oriented to person, place, and time. She has normal strength. No cranial nerve deficit.  Skin: Skin is warm, dry and intact. No rash noted. No erythema. No pallor.  Psychiatric: She has a normal mood and affect. Her speech is normal and behavior is normal. Her mood appears not anxious.  Nursing note and vitals reviewed.    ED Treatments / Results  Labs (all labs ordered are listed, but only abnormal  results are displayed) Results for orders placed or performed during the hospital encounter of 05/30/17  Lipase, blood  Result Value Ref Range   Lipase 24 11 - 51 U/L  Comprehensive metabolic panel  Result Value Ref Range   Sodium 141 135 - 145 mmol/L   Potassium 4.6 3.5 - 5.1 mmol/L   Chloride 108 101 - 111 mmol/L   CO2 24 22 - 32 mmol/L   Glucose, Bld 141 (H) 65 - 99 mg/dL   BUN 18 6 - 20 mg/dL   Creatinine, Ser 1.61 0.44 - 1.00 mg/dL   Calcium 8.9 8.9 - 09.6 mg/dL   Total Protein 7.8 6.5 - 8.1 g/dL   Albumin 4.1 3.5 - 5.0 g/dL   AST 17 15 - 41 U/L   ALT 14 14 - 54 U/L   Alkaline Phosphatase 65 38 - 126 U/L   Total Bilirubin 0.4 0.3 - 1.2 mg/dL   GFR calc non Af Amer >60 >60 mL/min   GFR calc Af Amer >60 >60 mL/min   Anion gap 9 5 - 15  CBC  Result Value Ref Range   WBC 12.4 (H) 4.0 - 10.5 K/uL   RBC 4.48 3.87 - 5.11 MIL/uL   Hemoglobin 13.2 12.0 - 15.0 g/dL   HCT 04.5 40.9 - 81.1 %   MCV 93.3 78.0 - 100.0 fL   MCH 29.5 26.0 - 34.0 pg   MCHC 31.6 30.0 - 36.0 g/dL   RDW 91.4 78.2 - 95.6 %   Platelets 211 150 - 400 K/uL  Urinalysis, Routine w reflex microscopic   Result Value Ref Range   Color, Urine YELLOW YELLOW   APPearance HAZY (A) CLEAR   Specific Gravity, Urine 1.021 1.005 - 1.030   pH 6.0 5.0 - 8.0   Glucose, UA NEGATIVE NEGATIVE mg/dL   Hgb urine dipstick NEGATIVE NEGATIVE   Bilirubin Urine NEGATIVE NEGATIVE   Ketones, ur 20 (A) NEGATIVE mg/dL   Protein, ur NEGATIVE NEGATIVE mg/dL   Nitrite POSITIVE (A) NEGATIVE   Leukocytes, UA MODERATE (A) NEGATIVE   RBC / HPF 0-5 0 - 5 RBC/hpf   WBC, UA TOO NUMEROUS TO COUNT 0 - 5 WBC/hpf   Bacteria, UA MANY (A) NONE SEEN   Squamous Epithelial / LPF 6-30 (A) NONE SEEN   Mucous PRESENT    Laboratory interpretation all normal except Leukocytosis, possible UTI    EKG  EKG Interpretation None       Radiology Ct Abdomen Pelvis W Contrast  Result Date: 05/31/2017 CLINICAL DATA:  Initial evaluation for acute abdominal pain, distension. EXAM: CT ABDOMEN AND PELVIS WITH CONTRAST TECHNIQUE: Multidetector CT imaging of the abdomen and pelvis was performed using the standard protocol following bolus administration of intravenous contrast. CONTRAST:  ISOVUE-300 IOPAMIDOL (ISOVUE-300) INJECTION 61% COMPARISON:  None available. FINDINGS: Lower chest: Scattered patchy and linear atelectatic changes present within the visualized lung bases. Visualized lung bases are otherwise clear. 5 mm subpleural nodule noted at the peripheral right middle lobe (series 4, image 4), indeterminate. Mild cardiomegaly, partially visualized. Hepatobiliary: Liver demonstrates a normal contrast enhanced appearance. Subcentimeter focus of hyperdense Lee/gas noted within the gallbladder lumen, likely reflecting internal stone. Gallbladder wall somewhat ill-defined with suggestion of pericholecystic free fluid, suggesting possible mild inflammation. No biliary dilatation. Pancreas: Pancreas within normal limits. Spleen: Somewhat irregular cystic lesion measuring 3.6 cm with scattered calcifications noted within the spleen, of  doubtful significance. Spleen otherwise unremarkable. Adrenals/Urinary Tract: Adrenal glands within normal limits. Kidneys equal in size  with symmetric enhancement. Scattered cyst noted within the right kidney, largest of which measures 3.6 cm. No nephrolithiasis, hydronephrosis, or focal enhancing renal mass. No hydroureter. Bladder largely decompressed without acute abnormality. Stomach/Bowel: Stomach within normal limits. No evidence for bowel obstruction. Appendix normal. Sigmoid diverticulosis without evidence for acute diverticulitis. No acute inflammatory changes seen about the bowels. Vascular/Lymphatic: Normal intravascular enhancement seen throughout the intra-abdominal aorta and its branch vessels. Mild aorto bi-iliac atherosclerotic disease. No adenopathy. Reproductive: Uterus is absent. Ovaries within normal limits. Pelvic support device in place. Other: No free intraperitoneal air. No free fluid or ascites. Note made of a few varices within the left abdomen. Musculoskeletal: No acute osseus abnormality. No worrisome lytic or blastic osseous lesions. Multilevel degenerate spondylolysis noted within the visualized spine, greatest at L2-3. IMPRESSION: 1. Cholelithiasis with mild pericholecystic free fluid, which may reflect sequelae of acute biliary pathology. Correlation with LFTs suggested. Additionally, further evaluation with dedicated right upper quadrant ultrasound may be helpful for further evaluation as warranted. 2. No other acute intra-abdominal or pelvic process. 3. Sigmoid diverticulosis without evidence for acute diverticulitis. 4. Mild aorto bi-iliac atherosclerotic disease. 5. 5 mm right middle lobe pulmonary nodule, indeterminate. No follow-up needed if patient is low-risk. Non-contrast chest CT can be considered in 12 months if patient is high-risk. This recommendation follows the consensus statement: Guidelines for Management of Incidental Pulmonary Nodules Detected on CT Images: From  the Fleischner Society 2017; Radiology 2017; 284:228-243. Electronically Signed   By: Rise Mu M.D.   On: 05/31/2017 00:54   US Abdomen Limited Ruq  Result Date: 05/31/2017 CLINICAL DATA:  Acute onset of right upper quadrant abdominal pain. Initial encounter. EXAM: ULTRASOUND ABDOMEN LIMITED RIGHT UPPER QUADRANT COMPARISON:  CT of the abdomen and pelvis performed earlier today at 12:32 a.m. FINDINGS: Gallbladder: Stones and sludge are noted within the gallbladder. There is gallbladder wall thickening, measuring up to 4 mm. No pericholecystic fluid or ultrasonographic Murphy's sign is elicited. Common bile duct: Diameter: 1.2 cm, dilated for the patient's age. Liver: No focal lesion identified. Diffusely increased parenchymal echogenicity and coarsened echotexture, compatible with fatty infiltration. IMPRESSION: 1. Dilatation of the common bile duct to 1.2 cm in diameter, raising concern for distal obstruction. Would correlate with LFTs, and consider MRCP or ERCP for further evaluation, as deemed clinically appropriate. 2. Gallbladder wall thickening, with cholelithiasis and sludge in the gallbladder. This may reflect underlying obstruction, given the dilated common bile duct. No ultrasonographic Murphy's sign elicited. 3. Diffuse fatty infiltration within the liver. Electronically Signed   By: Roanna Raider M.D.   On: 05/31/2017 03:18    Procedures Procedures (including critical care time)  Medications Ordered in ED Medications  0.9 %  sodium chloride infusion (not administered)  HYDROmorphone (DILAUDID) injection 1 mg (not administered)  ondansetron (ZOFRAN) injection 4 mg (not administered)  sodium chloride 0.9 % bolus 1,000 mL (0 mLs Intravenous Stopped 05/31/17 0323)  sodium chloride 0.9 % bolus 500 mL (0 mLs Intravenous Stopped 05/31/17 0155)  ondansetron (ZOFRAN) injection 4 mg (4 mg Intravenous Given 05/31/17 0019)  fentaNYL (SUBLIMAZE) injection 50 mcg (50 mcg Intravenous Given 05/31/17  0019)  iopamidol (ISOVUE-300) 61 % injection 100 mL (100 mLs Intravenous Contrast Given 05/31/17 0026)  ciprofloxacin (CIPRO) IVPB 400 mg (0 mg Intravenous Stopped 05/31/17 0323)  fentaNYL (SUBLIMAZE) injection 50 mcg (50 mcg Intravenous Given 05/31/17 0154)     Initial Impression / Assessment and Plan / ED Course  I have reviewed the triage vital signs and the  nursing notes.  Pertinent labs & imaging results that were available during my care of the patient were reviewed by me and considered in my medical decision making (see chart for details).  Patient was given IV fluids and started on IV nausea and pain medication. Due to the degree of her pain and we proceeded with CT of her abdomen pelvis. Consideration is given for bowel obstruction, colitis, diverticulitis. The fact that she ate popcorn prior to this episode makes it most likely to be diverticulitis.  Patient continued to have pain in her pain medicine was repeated. After reviewing her CT scan ultrasound of the right upper quadrant was done. After reviewing her urinalysis she was given IV cipro for probable UTI. Urine culture was sent.  04:20 AM Patient continues to complain of pain and she is not complaining of pain in her right upper quadrant. We discussed her test results. I'm going to talk to the hospitalist about admission for further testing of the dilatation of her common bile duct.  04:38 AM Dr Maryfrances Bunnell, hospitalist, will admit.   Final Clinical Impressions(s) / ED Diagnoses   Final diagnoses:  Left lower quadrant pain  Common bile duct dilatation  Acute cystitis without hematuria    Plan admission  Devoria Albe, MD, Concha Pyo, MD 05/31/17 [phone]

## 2017-05-30 NOTE — ED Triage Notes (Signed)
Pt states she is having abd pain that started today around 4pm  Pt states the pain is constant and miserable  Pt denies any N/V/D

## 2017-05-31 ENCOUNTER — Emergency Department (HOSPITAL_COMMUNITY): Payer: Medicare Other

## 2017-05-31 ENCOUNTER — Encounter (HOSPITAL_COMMUNITY): Payer: Self-pay | Admitting: Family Medicine

## 2017-05-31 ENCOUNTER — Inpatient Hospital Stay (HOSPITAL_COMMUNITY): Payer: Medicare Other

## 2017-05-31 DIAGNOSIS — I10 Essential (primary) hypertension: Secondary | ICD-10-CM

## 2017-05-31 DIAGNOSIS — K810 Acute cholecystitis: Secondary | ICD-10-CM | POA: Diagnosis not present

## 2017-05-31 DIAGNOSIS — N3000 Acute cystitis without hematuria: Secondary | ICD-10-CM | POA: Diagnosis not present

## 2017-05-31 DIAGNOSIS — K8000 Calculus of gallbladder with acute cholecystitis without obstruction: Secondary | ICD-10-CM | POA: Diagnosis not present

## 2017-05-31 DIAGNOSIS — I429 Cardiomyopathy, unspecified: Secondary | ICD-10-CM | POA: Diagnosis not present

## 2017-05-31 HISTORY — DX: Essential (primary) hypertension: I10

## 2017-05-31 LAB — URINALYSIS, ROUTINE W REFLEX MICROSCOPIC
BILIRUBIN URINE: NEGATIVE
Glucose, UA: NEGATIVE mg/dL
HGB URINE DIPSTICK: NEGATIVE
KETONES UR: 20 mg/dL — AB
NITRITE: POSITIVE — AB
PH: 6 (ref 5.0–8.0)
Protein, ur: NEGATIVE mg/dL
Specific Gravity, Urine: 1.021 (ref 1.005–1.030)

## 2017-05-31 MED ORDER — LOSARTAN POTASSIUM 50 MG PO TABS
50.0000 mg | ORAL_TABLET | Freq: Every day | ORAL | Status: DC
  Administered 2017-06-03: 50 mg via ORAL
  Filled 2017-05-31 (×3): qty 1

## 2017-05-31 MED ORDER — ENOXAPARIN SODIUM 60 MG/0.6ML ~~LOC~~ SOLN
60.0000 mg | SUBCUTANEOUS | Status: DC
  Filled 2017-05-31: qty 0.6

## 2017-05-31 MED ORDER — HYDROMORPHONE HCL 1 MG/ML IJ SOLN
1.0000 mg | INTRAMUSCULAR | Status: DC | PRN
  Administered 2017-05-31: 1 mg via INTRAVENOUS
  Filled 2017-05-31: qty 1

## 2017-05-31 MED ORDER — DEXTROSE 5 % IV SOLN
2.0000 g | INTRAVENOUS | Status: DC
  Administered 2017-05-31: 2 g via INTRAVENOUS
  Filled 2017-05-31: qty 2

## 2017-05-31 MED ORDER — PANTOPRAZOLE SODIUM 40 MG PO TBEC
40.0000 mg | DELAYED_RELEASE_TABLET | Freq: Every day | ORAL | Status: DC
  Administered 2017-05-31: 40 mg via ORAL
  Filled 2017-05-31: qty 1

## 2017-05-31 MED ORDER — SODIUM CHLORIDE 0.9 % IV SOLN
INTRAVENOUS | Status: DC
  Administered 2017-05-31 (×2): via INTRAVENOUS
  Administered 2017-06-01: 1000 mL via INTRAVENOUS
  Administered 2017-06-02: 75 mL via INTRAVENOUS

## 2017-05-31 MED ORDER — ONDANSETRON HCL 4 MG/2ML IJ SOLN: 4.0000 mg | Freq: Four times a day (QID) | INTRAMUSCULAR | Status: DC | PRN

## 2017-05-31 MED ORDER — GADOBENATE DIMEGLUMINE 529 MG/ML IV SOLN
20.0000 mL | Freq: Once | INTRAVENOUS | Status: AC | PRN
  Administered 2017-05-31: 20 mL via INTRAVENOUS

## 2017-05-31 MED ORDER — CARVEDILOL 3.125 MG PO TABS
3.1250 mg | ORAL_TABLET | Freq: Two times a day (BID) | ORAL | Status: DC
  Administered 2017-05-31 – 2017-06-06 (×10): 3.125 mg via ORAL
  Filled 2017-05-31 (×9): qty 1

## 2017-05-31 MED ORDER — SODIUM CHLORIDE 0.9 % IV SOLN: INTRAVENOUS | Status: DC

## 2017-05-31 MED ORDER — HYDROMORPHONE HCL-NACL 0.5-0.9 MG/ML-% IV SOSY
1.0000 mg | PREFILLED_SYRINGE | INTRAVENOUS | Status: DC | PRN
  Administered 2017-05-31 – 2017-06-02 (×3): 1 mg via INTRAVENOUS
  Filled 2017-05-31 (×4): qty 2

## 2017-05-31 MED ORDER — CIPROFLOXACIN IN D5W 400 MG/200ML IV SOLN
400.0000 mg | Freq: Once | INTRAVENOUS | Status: AC
  Administered 2017-05-31: 400 mg via INTRAVENOUS
  Filled 2017-05-31: qty 200

## 2017-05-31 MED ORDER — DEXTROSE 5 % IV SOLN
1.0000 g | INTRAVENOUS | Status: DC
  Administered 2017-06-01: 1 g via INTRAVENOUS
  Filled 2017-05-31: qty 10

## 2017-05-31 MED ORDER — FENTANYL CITRATE (PF) 100 MCG/2ML IJ SOLN
50.0000 ug | Freq: Once | INTRAMUSCULAR | Status: AC
  Administered 2017-05-31: 50 ug via INTRAVENOUS
  Filled 2017-05-31: qty 2

## 2017-05-31 NOTE — ED Notes (Signed)
FIRST ATTEMPT TO CALL REPORT TO 6E 1602-1.

## 2017-05-31 NOTE — Consult Note (Signed)
Reason for Consult:cholecystitis Referring Physician: Nakeisha, Crystal Yoder is an 71 y.o. female.  HPI: 71 yo female with 1 month of malaise and 1 day of abdominal pain. Pain was diffuse and constant. She notes eating before the pain started. She denies nausea and vomiting. She had a similar pain 1 month ago and had a bowel movement with relief. Yesterday she had a small bowel movement but with no relief.   Past Medical History:  Diagnosis Date  . Arthritis   . Arthropathy, unspecified, site unspecified   . COPD (chronic obstructive pulmonary disease) (HCC)   . Esophageal reflux   . Essential hypertension 05/31/2017  . Obesity, unspecified   . Obstructive sleep apnea (adult) (pediatric)   . Other acute sinusitis   . Other primary cardiomyopathies   . Psoriasis   . Shortness of breath   . Unspecified venous (peripheral) insufficiency     Past Surgical History:  Procedure Laterality Date  . ABDOMINAL HYSTERECTOMY  2011  . BLADDER SURGERY  2011   tac=ant/post  . IRRIGATION AND DEBRIDEMENT OF WOUND WITH SPLIT THICKNESS SKIN GRAFT Right 01/25/2014   Procedure: RIGHT FOOT IRRIGATION AND DEBRIDEMENT WITH ACELL/SPLICK THICKNESS SKINGRAFT WITH VAC;  Surgeon: Wayland Denis, DO;  Location:  [facility];  Service: Plastics;  Laterality: Right;  . PELVIC FLOOR REPAIR    . REPLACEMENT TOTAL KNEE     rt and lt  . 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 Prostate cancer Father   . Diverticulitis Father   . Diabetes Paternal Grandfather     Social History:  reports that she quit smoking about 37 years ago. Her smoking use included Cigarettes. She has a 5.00 pack-year smoking history. She has never used smokeless tobacco. She reports that she drinks alcohol. She reports that she does not use drugs.  Allergies:  Allergies  Allergen Reactions  . Penicillins Hives and Other (See Comments)    Has patient had a PCN reaction causing  immediate rash, facial/tongue/throat swelling, SOB or lightheadedness with hypotension: No Has patient had a PCN reaction causing severe rash involving mucus membranes or skin necrosis: No Has patient had a PCN reaction that required hospitalization: No Has patient had a PCN reaction occurring within the last 10 years: No If all of the above answers are "NO", then may proceed with Cephalosporin use.  . Sulfa Antibiotics Diarrhea and Nausea And Vomiting  . Sulfonamide Derivatives Diarrhea and Nausea And Vomiting    Medications: I have reviewed the patient's current medications.  Results for orders placed or performed during the hospital encounter of 05/30/17 (from the past 48 hour(s))  Lipase, blood     Status: None   Collection Time: 05/30/17  9:45 PM  Result Value Ref Range   Lipase 24 11 - 51 U/L  Comprehensive metabolic panel     Status: Abnormal   Collection Time: 05/30/17  9:45 PM  Result Value Ref Range   Sodium 141 135 - 145 mmol/L   Potassium 4.6 3.5 - 5.1 mmol/L   Chloride 108 101 - 111 mmol/L   CO2 24 22 - 32 mmol/L   Glucose, Bld 141 (H) 65 - 99 mg/dL   BUN 18 6 - 20 mg/dL   Creatinine, Ser 5.68 0.44 - 1.00 mg/dL   Calcium 8.9 8.9 - 61.6 mg/dL   Total Protein 7.8 6.5 - 8.1 g/dL   Albumin 4.1 3.5 - 5.0  g/dL   AST 17 15 - 41 U/L   ALT 14 14 - 54 U/L   Alkaline Phosphatase 65 38 - 126 U/L   Total Bilirubin 0.4 0.3 - 1.2 mg/dL   GFR calc non Af Amer >60 >60 mL/min   GFR calc Af Amer >60 >60 mL/min    Comment: (NOTE) The eGFR has been calculated using the CKD EPI equation. This calculation has not been validated in all clinical situations. eGFR's persistently <60 mL/min signify possible Chronic Kidney Disease.    Anion gap 9 5 - 15  CBC     Status: Abnormal   Collection Time: 05/30/17  9:45 PM  Result Value Ref Range   WBC 12.4 (H) 4.0 - 10.5 K/uL   RBC 4.48 3.87 - 5.11 MIL/uL   Hemoglobin 13.2 12.0 - 15.0 g/dL   HCT 41.8 36.0 - 46.0 %   MCV 93.3 78.0 - 100.0 fL    MCH 29.5 26.0 - 34.0 pg   MCHC 31.6 30.0 - 36.0 g/dL   RDW 14.4 11.5 - 15.5 %   Platelets 211 150 - 400 K/uL  Urinalysis, Routine w reflex microscopic     Status: Abnormal   Collection Time: 05/30/17 11:39 PM  Result Value Ref Range   Color, Urine YELLOW YELLOW   APPearance HAZY (A) CLEAR   Specific Gravity, Urine 1.021 1.005 - 1.030   pH 6.0 5.0 - 8.0   Glucose, UA NEGATIVE NEGATIVE mg/dL   Hgb urine dipstick NEGATIVE NEGATIVE   Bilirubin Urine NEGATIVE NEGATIVE   Ketones, ur 20 (A) NEGATIVE mg/dL   Protein, ur NEGATIVE NEGATIVE mg/dL   Nitrite POSITIVE (A) NEGATIVE   Leukocytes, UA MODERATE (A) NEGATIVE   RBC / HPF 0-5 0 - 5 RBC/hpf   WBC, UA TOO NUMEROUS TO COUNT 0 - 5 WBC/hpf   Bacteria, UA MANY (A) NONE SEEN   Squamous Epithelial / LPF 6-30 (A) NONE SEEN   Mucous PRESENT     Ct Abdomen Pelvis W Contrast  Result Date: 05/31/2017 CLINICAL DATA:  Initial evaluation for acute abdominal pain, distension. EXAM: CT ABDOMEN AND PELVIS WITH CONTRAST TECHNIQUE: Multidetector CT imaging of the abdomen and pelvis was performed using the standard protocol following bolus administration of intravenous contrast. CONTRAST:  130m ISOVUE-300 IOPAMIDOL (ISOVUE-300) INJECTION 61% COMPARISON:  None available. FINDINGS: Lower chest: Scattered patchy and linear atelectatic changes present within the visualized lung bases. Visualized lung bases are otherwise clear. 5 mm subpleural nodule noted at the peripheral right middle lobe (series 4, image 4), indeterminate. Mild cardiomegaly, partially visualized. Hepatobiliary: Liver demonstrates a normal contrast enhanced appearance. Subcentimeter focus of hyperdense Lee/gas noted within the gallbladder lumen, likely reflecting internal stone. Gallbladder wall somewhat ill-defined with suggestion of pericholecystic free fluid, suggesting possible mild inflammation. No biliary dilatation. Pancreas: Pancreas within normal limits. Spleen: Somewhat irregular cystic  lesion measuring 3.6 cm with scattered calcifications noted within the spleen, of doubtful significance. Spleen otherwise unremarkable. Adrenals/Urinary Tract: Adrenal glands within normal limits. Kidneys equal in size with symmetric enhancement. Scattered cyst noted within the right kidney, largest of which measures 3.6 cm. No nephrolithiasis, hydronephrosis, or focal enhancing renal mass. No hydroureter. Bladder largely decompressed without acute abnormality. Stomach/Bowel: Stomach within normal limits. No evidence for bowel obstruction. Appendix normal. Sigmoid diverticulosis without evidence for acute diverticulitis. No acute inflammatory changes seen about the bowels. Vascular/Lymphatic: Normal intravascular enhancement seen throughout the intra-abdominal aorta and its branch vessels. Mild aorto bi-iliac atherosclerotic disease. No adenopathy. Reproductive: Uterus is absent.  Ovaries within normal limits. Pelvic support device in place. Other: No free intraperitoneal air. No free fluid or ascites. Note made of a few varices within the left abdomen. Musculoskeletal: No acute osseus abnormality. No worrisome lytic or blastic osseous lesions. Multilevel degenerate spondylolysis noted within the visualized spine, greatest at L2-3. IMPRESSION: 1. Cholelithiasis with mild pericholecystic free fluid, which may reflect sequelae of acute biliary pathology. Correlation with LFTs suggested. Additionally, further evaluation with dedicated right upper quadrant ultrasound may be helpful for further evaluation as warranted. 2. No other acute intra-abdominal or pelvic process. 3. Sigmoid diverticulosis without evidence for acute diverticulitis. 4. Mild aorto bi-iliac atherosclerotic disease. 5. 5 mm right middle lobe pulmonary nodule, indeterminate. No follow-up needed if patient is low-risk. Non-contrast chest CT can be considered in 12 months if patient is high-risk. This recommendation follows the consensus statement:  Guidelines for Management of Incidental Pulmonary Nodules Detected on CT Images: From the Fleischner Society 2017; Radiology 2017; 284:228-243. Electronically Signed   By: Jeannine Boga M.D.   On: 05/31/2017 00:54   US Abdomen Limited Ruq  Result Date: 05/31/2017 CLINICAL DATA:  Acute onset of right upper quadrant abdominal pain. Initial encounter. EXAM: ULTRASOUND ABDOMEN LIMITED RIGHT UPPER QUADRANT COMPARISON:  CT of the abdomen and pelvis performed earlier today at 12:32 a.m. FINDINGS: Gallbladder: Stones and sludge are noted within the gallbladder. There is gallbladder wall thickening, measuring up to 4 mm. No pericholecystic fluid or ultrasonographic Murphy's sign is elicited. Common bile duct: Diameter: 1.2 cm, dilated for the patient's age. Liver: No focal lesion identified. Diffusely increased parenchymal echogenicity and coarsened echotexture, compatible with fatty infiltration. IMPRESSION: 1. Dilatation of the common bile duct to 1.2 cm in diameter, raising concern for distal obstruction. Would correlate with LFTs, and consider MRCP or ERCP for further evaluation, as deemed clinically appropriate. 2. Gallbladder wall thickening, with cholelithiasis and sludge in the gallbladder. This may reflect underlying obstruction, given the dilated common bile duct. No ultrasonographic Murphy's sign elicited. 3. Diffuse fatty infiltration within the liver. Electronically Signed   By: Garald Balding M.D.   On: 05/31/2017 03:18    Review of Systems  Constitutional: Negative for chills and fever.  HENT: Negative for hearing loss.   Eyes: Negative for blurred vision and double vision.  Respiratory: Negative for cough and hemoptysis.   Cardiovascular: Negative for chest pain and palpitations.  Gastrointestinal: Positive for abdominal pain and constipation. Negative for nausea and vomiting.  Genitourinary: Negative for dysuria and urgency.  Musculoskeletal: Negative for myalgias and neck pain.  Skin:  Negative for itching and rash.  Neurological: Negative for dizziness, tingling and headaches.  Endo/Heme/Allergies: Does not bruise/bleed easily.  Psychiatric/Behavioral: Negative for depression and suicidal ideas.   Blood pressure (!) 146/61, pulse 68, temperature 98.5 F (36.9 C), temperature source Oral, resp. rate 16, height '5\' 9"'$  (1.753 m), weight 120.2 kg (265 lb), SpO2 93 %. Physical Exam  Vitals reviewed. Constitutional: She is oriented to person, place, and time. She appears well-developed and well-nourished.  HENT:  Head: Normocephalic and atraumatic.  Eyes: Pupils are equal, round, and reactive to light. Conjunctivae and EOM are normal.  Neck: Normal range of motion. Neck supple.  Cardiovascular: Normal rate and regular rhythm.   Respiratory: Effort normal and breath sounds normal.  GI: Soft. Bowel sounds are normal. She exhibits no distension. There is tenderness in the right upper quadrant. There is no guarding.  Musculoskeletal: Normal range of motion.  Neurological: She is alert and oriented to person,  place, and time.  Skin: Skin is warm and dry.  Psychiatric: She has a normal mood and affect. Her behavior is normal.    Assessment/Plan: 71 yo female with findings consistent with acute cholecystitis vs choledocholithiasis. Her LFTs are normal but her [place] does show a larger than expected CBD. She has reproducible pain in the RUQ on exam. -agree with MRCP -recommended gallbladder removal prior to discharge given acute pain, inflammatory changes to gallbladder on imaging and potential choledocholithiasis.   Arta Bruce Kinsinger 05/31/2017, 12:29 PM

## 2017-05-31 NOTE — ED Notes (Signed)
WILL TRANSPORT PT TO 6E 1602-1. AAOX4. PT IN NO APPARENT DISTRESS WITH MILD PAIN. THE OPPORTUNITY TO ASK QUESTIONS WAS PROVIDED.

## 2017-05-31 NOTE — Progress Notes (Signed)
Report called to [place] [facility] on 5W, patient being transferred now in bed.

## 2017-05-31 NOTE — ED Notes (Signed)
US AT BEDSIDE

## 2017-05-31 NOTE — Progress Notes (Signed)
  PROGRESS NOTE  Patient admitted earlier this morning. See H&P. Crystal Yoder is a 71 yo female with past medical hx NICM EF 45%, HTN and morbid obesity who presented to the ED with 1 day history of diffuse abdominal pain. This was essentially constant pain, severe, "excruciating" in intensity, hard to localize (but she mostly points to the epigastrium), and associated with bloating. She had a small bowel movement that did not relieve the pain, finally decided she had diverticulitis like her father, and came to the ER. Work up in the ED revealed cholelithiasis with mild pericholecystic free fluid, and dilatation of the common bile duct to 1.2 cm in diameter, raising concern for distal obstruction, gallbladder wall thickening, with cholelithiasis and sludge in the gallbladder.   General surgery consulted MRCP pending NPO  Rocephin for UTI POA  IVF   Noralee Stain, DO [facility] [email or web address] Password [email or web address] 05/31/2017, 12:03 PM

## 2017-05-31 NOTE — H&P (Addendum)
History and Physical  Patient Name: Crystal Yoder     JYN:[id number]    DOB: 06/06/1946    DOA: 05/30/2017 PCP: Crystal Montana, MD  Patient coming from: Home  Chief Complaint: Abdominal pain      HPI: Crystal Yoder is a 71 y.o. female with a past medical history significant for NICM EF 45%, HTN and morbid obesity who presents with 1d abdominal pain.  The patient is a somewhat rambling historian (she starts her history by discussing sinus problems she has had all summer), but it sounds as if she was totally her normal health until tonight, she went to the car dealership with her husband and ate a box of popcorn there.  About an hour later she developed abdominal pain. This was essentially constant pain, severe, "excruciating" in intensity, hard to localize (but she mostly points to the epigastrium), and associated with bloating. She had a small bowel movement that did not relieve the pain, finally decided she had diverticulitis like her father, and came to the ER.  She has had some stuttering "cramps" like this earlier this summer, but couldn't tell what it was from.  ED course: -Afebrile, heart rate 56, respirations and pulse ox normal, blood pressure 163/98 -Na 141, K 4.6, Cr 0.7 (baseline 1.0), WBC 12.4K, Hgb 13.2 -Lipase normal -UA with leukocytes, bacteria -CT abdomen and pelvis showed no renal pathology, but did show evidence of gallbladder disease -RUQ US showed thickened gallbladder with pericholecystic fluid, CBD 1.2 cm and stones -She was given Cipro for a UTI and TRH were asked to evaluate for cholecystitis     ROS: Review of Systems  Constitutional: Positive for malaise/fatigue. Negative for chills and fever.  Gastrointestinal: Positive for abdominal pain. Negative for blood in stool, constipation, diarrhea, melena, nausea and vomiting.  Genitourinary: Positive for dysuria and frequency (chronic).  All other systems reviewed and are negative.         Past Medical  History:  Diagnosis Date  . Arthritis   . Arthropathy, unspecified, site unspecified   . COPD (chronic obstructive pulmonary disease) (HCC)   . Esophageal reflux   . Essential hypertension 05/31/2017  . Obesity, unspecified   . Obstructive sleep apnea (adult) (pediatric)   . Other acute sinusitis   . Other primary cardiomyopathies   . Psoriasis   . Shortness of breath   . Unspecified venous (peripheral) insufficiency     Past Surgical History:  Procedure Laterality Date  . ABDOMINAL HYSTERECTOMY  2011  . BLADDER SURGERY  2011   tac=ant/post  . IRRIGATION AND DEBRIDEMENT OF WOUND WITH SPLIT THICKNESS SKIN GRAFT Right 01/25/2014   Procedure: RIGHT FOOT IRRIGATION AND DEBRIDEMENT WITH ACELL/SPLICK THICKNESS SKINGRAFT WITH VAC;  Surgeon: Wayland Denis, DO;  Location: [facility];  Service: Plastics;  Laterality: Right;  . PELVIC FLOOR REPAIR    . REPLACEMENT TOTAL KNEE     rt and lt  . 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  The patient walks unassisted.  Remote rare former smoker.  Was a Engineer, site.  Allergies  Allergen Reactions  . Penicillins Hives and Other (See Comments)    Has patient had a PCN reaction causing immediate rash, facial/tongue/throat swelling, SOB or lightheadedness with hypotension: No Has patient had a PCN reaction causing severe rash involving mucus membranes or skin necrosis: No Has patient had a PCN reaction that required hospitalization: No Has patient had a PCN reaction  occurring within the last 10 years: No If all of the above answers are "NO", then may proceed with Cephalosporin use.  . Sulfa Antibiotics Diarrhea and Nausea And Vomiting  . Sulfonamide Derivatives Diarrhea and Nausea And Vomiting    Family history: family history includes Diabetes in her paternal grandfather; Diverticulitis in her father; Prostate cancer in her father.  Prior to Admission medications   Medication Sig  Start Date End Date Taking? Authorizing Provider  Ascorbic Acid (VITAMIN C) 1000 MG tablet Take 1,000 mg by mouth daily.   Yes [provider]  Azelastine-Fluticasone (DYMISTA) 137-50 MCG/ACT SUSP Place 2 sprays into both nostrils at bedtime. 05/28/17  Yes Jetty Duhamel D, MD  carvedilol (COREG) 3.125 MG tablet take 1 tablet by mouth twice a day with meals 02/25/17  Yes Pricilla Riffle, MD  cholecalciferol (VITAMIN D) 1000 units tablet Take 1,000 Units by mouth daily.   Yes [provider]  losartan (COZAAR) 50 MG tablet TAKE 1 TABLET BY MOUTH DAILY 02/16/17  Yes Pricilla Riffle, MD  Multiple Vitamin (MULTIVITAMIN WITH MINERALS) TABS tablet Take 1 tablet by mouth daily.   Yes [provider]  pantoprazole (PROTONIX) 40 MG tablet Take 40 mg by mouth daily.    Yes [provider]       Physical Exam: BP (!) 161/100 (BP Location: Left Arm)   Pulse 63   Temp 98.1 F (36.7 C) (Oral)   Resp 18   Ht 5\' 9"  (1.753 m)   Wt 120.2 kg (265 lb)   SpO2 99%   BMI 39.13 kg/m  General appearance: Well-developed, obese adult female, alert and in moderate distress from pain.   Eyes: Anicteric, conjunctiva pink, lids and lashes normal. PERRL.    ENT: No nasal deformity, discharge, epistaxis.  Hearing normal. OP moist without lesions.   Skin: Warm and dry.  No jaundice.  No suspicious rashes or lesions. Cardiac: RRR, nl S1-S2, no murmurs appreciated.  Capillary refill is brisk.  JVP not visible.  No LE edema.  Radial pulses 2+ and symmetric. Respiratory: Normal respiratory rate and rhythm.  CTAB without rales or wheezes. Abdomen: Abdomen soft.  Mild TTP, more pronounced in RUQ, no guarding or rebound. No ascites, distension.   MSK: No deformities or effusions.  No cyanosis or clubbing. Neuro: Cranial nerves normal. Speech is fluent.  Muscle strength equal, normal.    Psych: Sensorium intact and responding to questions, attention normal.  Behavior appropriate.  Affect normal.   Judgment and insight appear normal.     Labs on Admission:  I have personally reviewed following labs and imaging studies: CBC:  Recent Labs Lab 05/30/17 2145  WBC 12.4*  HGB 13.2  HCT 41.8  MCV 93.3  PLT 211   Basic Metabolic Panel:  Recent Labs Lab 05/30/17 2145  NA 141  K 4.6  CL 108  CO2 24  GLUCOSE 141*  BUN 18  CREATININE 0.77  CALCIUM 8.9   GFR: Estimated Creatinine Clearance: 89.4 mL/min (by C-G formula based on SCr of 0.77 mg/dL).  Liver Function Tests:  Recent Labs Lab 05/30/17 2145  AST 17  ALT 14  ALKPHOS 65  BILITOT 0.4  PROT 7.8  ALBUMIN 4.1    Recent Labs Lab 05/30/17 2145  LIPASE 24        Radiological Exams on Admission: Personally reviewed CT report, also discussed with Radiology; RUQ [place] report reviewed: Ct Abdomen Pelvis W Contrast  Result Date: 05/31/2017 CLINICAL DATA:  Initial evaluation  for acute abdominal pain, distension. EXAM: CT ABDOMEN AND PELVIS WITH CONTRAST TECHNIQUE: Multidetector CT imaging of the abdomen and pelvis was performed using the standard protocol following bolus administration of intravenous contrast. CONTRAST:  ISOVUE-300 IOPAMIDOL (ISOVUE-300) INJECTION 61% COMPARISON:  None available. FINDINGS: Lower chest: Scattered patchy and linear atelectatic changes present within the visualized lung bases. Visualized lung bases are otherwise clear. 5 mm subpleural nodule noted at the peripheral right middle lobe (series 4, image 4), indeterminate. Mild cardiomegaly, partially visualized. Hepatobiliary: Liver demonstrates a normal contrast enhanced appearance. Subcentimeter focus of hyperdense Lee/gas noted within the gallbladder lumen, likely reflecting internal stone. Gallbladder wall somewhat ill-defined with suggestion of pericholecystic free fluid, suggesting possible mild inflammation. No biliary dilatation. Pancreas: Pancreas within normal limits. Spleen: Somewhat irregular cystic lesion measuring 3.6 cm with  scattered calcifications noted within the spleen, of doubtful significance. Spleen otherwise unremarkable. Adrenals/Urinary Tract: Adrenal glands within normal limits. Kidneys equal in size with symmetric enhancement. Scattered cyst noted within the right kidney, largest of which measures 3.6 cm. No nephrolithiasis, hydronephrosis, or focal enhancing renal mass. No hydroureter. Bladder largely decompressed without acute abnormality. Stomach/Bowel: Stomach within normal limits. No evidence for bowel obstruction. Appendix normal. Sigmoid diverticulosis without evidence for acute diverticulitis. No acute inflammatory changes seen about the bowels. Vascular/Lymphatic: Normal intravascular enhancement seen throughout the intra-abdominal aorta and its branch vessels. Mild aorto bi-iliac atherosclerotic disease. No adenopathy. Reproductive: Uterus is absent. Ovaries within normal limits. Pelvic support device in place. Other: No free intraperitoneal air. No free fluid or ascites. Note made of a few varices within the left abdomen. Musculoskeletal: No acute osseus abnormality. No worrisome lytic or blastic osseous lesions. Multilevel degenerate spondylolysis noted within the visualized spine, greatest at L2-3. IMPRESSION: 1. Cholelithiasis with mild pericholecystic free fluid, which may reflect sequelae of acute biliary pathology. Correlation with LFTs suggested. Additionally, further evaluation with dedicated right upper quadrant ultrasound may be helpful for further evaluation as warranted. 2. No other acute intra-abdominal or pelvic process. 3. Sigmoid diverticulosis without evidence for acute diverticulitis. 4. Mild aorto bi-iliac atherosclerotic disease. 5. 5 mm right middle lobe pulmonary nodule, indeterminate. No follow-up needed if patient is low-risk. Non-contrast chest CT can be considered in 12 months if patient is high-risk. This recommendation follows the consensus statement: Guidelines for Management of  Incidental Pulmonary Nodules Detected on CT Images: From the Fleischner Society 2017; Radiology 2017; 284:228-243. Electronically Signed   By: Rise Mu M.D.   On: 05/31/2017 00:54   US Abdomen Limited Ruq  Result Date: 05/31/2017 CLINICAL DATA:  Acute onset of right upper quadrant abdominal pain. Initial encounter. EXAM: ULTRASOUND ABDOMEN LIMITED RIGHT UPPER QUADRANT COMPARISON:  CT of the abdomen and pelvis performed earlier today at 12:32 a.m. FINDINGS: Gallbladder: Stones and sludge are noted within the gallbladder. There is gallbladder wall thickening, measuring up to 4 mm. No pericholecystic fluid or ultrasonographic Murphy's sign is elicited. Common bile duct: Diameter: 1.2 cm, dilated for the patient's age. Liver: No focal lesion identified. Diffusely increased parenchymal echogenicity and coarsened echotexture, compatible with fatty infiltration. IMPRESSION: 1. Dilatation of the common bile duct to 1.2 cm in diameter, raising concern for distal obstruction. Would correlate with LFTs, and consider MRCP or ERCP for further evaluation, as deemed clinically appropriate. 2. Gallbladder wall thickening, with cholelithiasis and sludge in the gallbladder. This may reflect underlying obstruction, given the dilated common bile duct. No ultrasonographic Murphy's sign elicited. 3. Diffuse fatty infiltration within the liver. Electronically Signed   By: Leotis Shames  Chang M.D.   On: 05/31/2017 03:18       Assessment/Plan  1. Acute cholecystitis:  Acute RUQ and epigastric pain, leukocytosis, cholecystitis with galstones on [place] with RUQ pain on exam. -Ceftriaxone IV -Clears only -MRCP ordered -Will consult GI pending results of MRCP, or Gen Surg -Dilaudid for pain, ondansetron for nausea   2. HTN, NICM:  -Continue losartan -Continue carvedilol  3. Nodule on Chest CT:  -Follow up with PCP in 1 year  4. Other medications:  -Continue PPI  5. UTI: States she thought she had some symptoms  of UTI this week.  Odd that she would have this coincidentally, but it is covered by tx for #1. -Follow urine culture        DVT prophylaxis: Lovenox  Code Status: FULL  Family Communication: None present  Disposition Plan: Anticipate IV antibiotics, MRCP and conservative mgmt of cholecystitis. Consults called: None overnight Admission status: INPATIENT    Medical decision making: Patient seen at 5:00 AM on 05/31/2017.  The patient was discussed with Dr. Lynelle Doctor.  What exists of the patient's chart was reviewed in depth and summarized above.  Clinical condition: stable.        Alberteen Sam [facility] Pager [phone]      At the time of admission, it appears that the appropriate admission status for this patient is INPATIENT. This is judged to be reasonable and necessary in order to provide the required intensity of service to ensure the patient's safety given the presenting symptoms, physical exam findings, and initial radiographic and laboratory data in the context of their chronic comorbidities.  Together, these circumstances are felt to place her at high risk for further clinical deterioration threatening life, limb, or organ.   Patient requires inpatient status due to high intensity of service, high risk for further deterioration and high frequency of surveillance required because of this acute illness that poses a threat to life, limb or bodily function.  I certify that at the point of admission it is my clinical judgment that the patient will require inpatient hospital care spanning beyond 2 midnights from the point of admission and that early discharge would result in unnecessary risk of decompensation and readmission or threat to life, limb or bodily function.

## 2017-05-31 NOTE — ED Notes (Signed)
This Clinical research associate did not complete CT. RN have been made aware

## 2017-05-31 NOTE — ED Notes (Signed)
Patient transported to CT 

## 2017-06-01 ENCOUNTER — Encounter (HOSPITAL_COMMUNITY)
Admission: EM | Disposition: A | Discharge status: Home Health | Payer: Self-pay | Source: Home / Self Care | Attending: Internal Medicine

## 2017-06-01 ENCOUNTER — Inpatient Hospital Stay (HOSPITAL_COMMUNITY): Payer: Medicare Other | Admitting: Certified Registered"

## 2017-06-01 ENCOUNTER — Encounter (HOSPITAL_COMMUNITY): Payer: Self-pay | Admitting: Certified Registered"

## 2017-06-01 ENCOUNTER — Inpatient Hospital Stay (HOSPITAL_COMMUNITY): Payer: Medicare Other

## 2017-06-01 DIAGNOSIS — K810 Acute cholecystitis: Secondary | ICD-10-CM | POA: Diagnosis not present

## 2017-06-01 HISTORY — PX: CHOLECYSTECTOMY: SHX55

## 2017-06-01 LAB — CBC
HCT: 40.4 % (ref 36.0–46.0)
HEMOGLOBIN: 13 g/dL (ref 12.0–15.0)
MCH: 29.8 pg (ref 26.0–34.0)
MCHC: 32.2 g/dL (ref 30.0–36.0)
MCV: 92.7 fL (ref 78.0–100.0)
PLATELETS: 177 10*3/uL (ref 150–400)
RBC: 4.36 MIL/uL (ref 3.87–5.11)
RDW: 14.4 % (ref 11.5–15.5)
WBC: 19.7 10*3/uL — ABNORMAL HIGH (ref 4.0–10.5)

## 2017-06-01 LAB — COMPREHENSIVE METABOLIC PANEL
ALBUMIN: 3.2 g/dL — AB (ref 3.5–5.0)
ALK PHOS: 47 U/L (ref 38–126)
ALT: 14 U/L (ref 14–54)
AST: 18 U/L (ref 15–41)
Anion gap: 7 (ref 5–15)
BUN: 11 mg/dL (ref 6–20)
CHLORIDE: 108 mmol/L (ref 101–111)
CO2: 26 mmol/L (ref 22–32)
CREATININE: 0.66 mg/dL (ref 0.44–1.00)
Calcium: 8.6 mg/dL — ABNORMAL LOW (ref 8.9–10.3)
GFR calc non Af Amer: >60 mL/min (ref >60)
GLUCOSE: 113 mg/dL — AB (ref 65–99)
Potassium: 4.1 mmol/L (ref 3.5–5.1)
SODIUM: 141 mmol/L (ref 135–145)
Total Bilirubin: 0.7 mg/dL (ref 0.3–1.2)
Total Protein: 6.5 g/dL (ref 6.5–8.1)

## 2017-06-01 SURGERY — LAPAROSCOPIC CHOLECYSTECTOMY WITH INTRAOPERATIVE CHOLANGIOGRAM
Anesthesia: General | Site: Abdomen

## 2017-06-01 MED ORDER — PANTOPRAZOLE SODIUM 40 MG IV SOLR
40.0000 mg | Freq: Every day | INTRAVENOUS | Status: DC
  Administered 2017-06-02 – 2017-06-03 (×2): 40 mg via INTRAVENOUS
  Filled 2017-06-01 (×2): qty 40

## 2017-06-01 MED ORDER — SUGAMMADEX SODIUM 200 MG/2ML IV SOLN
INTRAVENOUS | Status: DC | PRN
  Administered 2017-06-01: 300 mg via INTRAVENOUS

## 2017-06-01 MED ORDER — DIPHENHYDRAMINE HCL 12.5 MG/5ML PO ELIX: 12.5000 mg | ORAL_SOLUTION | Freq: Four times a day (QID) | ORAL | Status: DC | PRN

## 2017-06-01 MED ORDER — BUPIVACAINE-EPINEPHRINE (PF) 0.25% -1:200000 IJ SOLN
INTRAMUSCULAR | Status: AC
  Filled 2017-06-01: qty 30

## 2017-06-01 MED ORDER — BUPIVACAINE-EPINEPHRINE 0.25% -1:200000 IJ SOLN
INTRAMUSCULAR | Status: DC | PRN
  Administered 2017-06-01: 30 mL

## 2017-06-01 MED ORDER — SUCCINYLCHOLINE CHLORIDE 200 MG/10ML IV SOSY
PREFILLED_SYRINGE | INTRAVENOUS | Status: DC | PRN
  Administered 2017-06-01: 120 mg via INTRAVENOUS

## 2017-06-01 MED ORDER — 0.9 % SODIUM CHLORIDE (POUR BTL) OPTIME
TOPICAL | Status: DC | PRN
  Administered 2017-06-01: 1000 mL

## 2017-06-01 MED ORDER — FENTANYL CITRATE (PF) 100 MCG/2ML IJ SOLN
INTRAMUSCULAR | Status: AC
  Filled 2017-06-01: qty 2

## 2017-06-01 MED ORDER — ONDANSETRON HCL 4 MG/2ML IJ SOLN
INTRAMUSCULAR | Status: DC | PRN
  Administered 2017-06-01: 4 mg via INTRAVENOUS

## 2017-06-01 MED ORDER — ROCURONIUM BROMIDE 50 MG/5ML IV SOSY
PREFILLED_SYRINGE | INTRAVENOUS | Status: AC
  Filled 2017-06-01: qty 5

## 2017-06-01 MED ORDER — ACETAMINOPHEN 500 MG PO TABS
1000.0000 mg | ORAL_TABLET | Freq: Four times a day (QID) | ORAL | Status: DC
  Administered 2017-06-02 – 2017-06-03 (×5): 1000 mg via ORAL
  Filled 2017-06-01 (×6): qty 2

## 2017-06-01 MED ORDER — PROPOFOL 10 MG/ML IV BOLUS
INTRAVENOUS | Status: AC
  Filled 2017-06-01: qty 20

## 2017-06-01 MED ORDER — FENTANYL CITRATE (PF) 250 MCG/5ML IJ SOLN
INTRAMUSCULAR | Status: DC | PRN
Start: 2017-06-01 — End: 2017-06-01
  Administered 2017-06-01 (×5): 50 ug via INTRAVENOUS

## 2017-06-01 MED ORDER — LACTATED RINGERS IR SOLN
Status: DC | PRN
  Administered 2017-06-01: 2000 mL

## 2017-06-01 MED ORDER — MIDAZOLAM HCL 2 MG/2ML IJ SOLN
INTRAMUSCULAR | Status: AC
  Filled 2017-06-01: qty 2

## 2017-06-01 MED ORDER — MIDAZOLAM HCL 2 MG/2ML IJ SOLN
INTRAMUSCULAR | Status: DC | PRN
  Administered 2017-06-01 (×2): 1 mg via INTRAVENOUS

## 2017-06-01 MED ORDER — ENOXAPARIN SODIUM 60 MG/0.6ML ~~LOC~~ SOLN
60.0000 mg | SUBCUTANEOUS | Status: DC
  Administered 2017-06-02 – 2017-06-06 (×5): 60 mg via SUBCUTANEOUS
  Filled 2017-06-01 (×6): qty 0.6

## 2017-06-01 MED ORDER — LIDOCAINE 2% (20 MG/ML) 5 ML SYRINGE
INTRAMUSCULAR | Status: DC | PRN
  Administered 2017-06-01: 60 mg via INTRAVENOUS

## 2017-06-01 MED ORDER — MORPHINE SULFATE (PF) 2 MG/ML IV SOLN
1.0000 mg | INTRAVENOUS | Status: DC | PRN
  Administered 2017-06-01 (×2): 2 mg via INTRAVENOUS
  Filled 2017-06-01 (×2): qty 1

## 2017-06-01 MED ORDER — IOPAMIDOL (ISOVUE-300) INJECTION 61%
INTRAVENOUS | Status: AC
  Filled 2017-06-01: qty 50

## 2017-06-01 MED ORDER — FENTANYL CITRATE (PF) 100 MCG/2ML IJ SOLN
INTRAMUSCULAR | Status: DC | PRN
  Administered 2017-06-01 (×2): 50 ug via INTRAVENOUS

## 2017-06-01 MED ORDER — ONDANSETRON HCL 4 MG/2ML IJ SOLN: 4.0000 mg | Freq: Once | INTRAMUSCULAR | Status: DC | PRN

## 2017-06-01 MED ORDER — PROPOFOL 10 MG/ML IV BOLUS
INTRAVENOUS | Status: DC | PRN
  Administered 2017-06-01: 140 mg via INTRAVENOUS

## 2017-06-01 MED ORDER — ROCURONIUM BROMIDE 10 MG/ML (PF) SYRINGE
PREFILLED_SYRINGE | INTRAVENOUS | Status: DC | PRN
  Administered 2017-06-01 (×2): 10 mg via INTRAVENOUS
  Administered 2017-06-01: 5 mg via INTRAVENOUS
  Administered 2017-06-01: 30 mg via INTRAVENOUS
  Administered 2017-06-01: 5 mg via INTRAVENOUS
  Administered 2017-06-01: 10 mg via INTRAVENOUS

## 2017-06-01 MED ORDER — MEPERIDINE HCL 50 MG/ML IJ SOLN: 6.2500 mg | INTRAMUSCULAR | Status: DC | PRN

## 2017-06-01 MED ORDER — FENTANYL CITRATE (PF) 250 MCG/5ML IJ SOLN
INTRAMUSCULAR | Status: AC
  Filled 2017-06-01: qty 5

## 2017-06-01 MED ORDER — LACTATED RINGERS IV SOLN
INTRAVENOUS | Status: DC | PRN
  Administered 2017-06-01 (×2): via INTRAVENOUS

## 2017-06-01 MED ORDER — PHENYLEPHRINE 40 MCG/ML (10ML) SYRINGE FOR IV PUSH (FOR BLOOD PRESSURE SUPPORT)
PREFILLED_SYRINGE | INTRAVENOUS | Status: DC | PRN
  Administered 2017-06-01: 80 ug via INTRAVENOUS
  Administered 2017-06-01: 120 ug via INTRAVENOUS

## 2017-06-01 MED ORDER — HYDROMORPHONE HCL-NACL 0.5-0.9 MG/ML-% IV SOSY
0.2500 mg | PREFILLED_SYRINGE | INTRAVENOUS | Status: DC | PRN
  Administered 2017-06-01: 0.5 mg via INTRAVENOUS

## 2017-06-01 MED ORDER — DOCUSATE SODIUM 100 MG PO CAPS
100.0000 mg | ORAL_CAPSULE | Freq: Two times a day (BID) | ORAL | Status: DC
  Administered 2017-06-01 – 2017-06-06 (×10): 100 mg via ORAL
  Filled 2017-06-01 (×10): qty 1

## 2017-06-01 MED ORDER — FENTANYL CITRATE (PF) 100 MCG/2ML IJ SOLN
25.0000 ug | INTRAMUSCULAR | Status: DC | PRN
  Administered 2017-06-01 (×2): 50 ug via INTRAVENOUS

## 2017-06-01 MED ORDER — OXYCODONE HCL 5 MG PO TABS
5.0000 mg | ORAL_TABLET | ORAL | Status: DC | PRN
  Administered 2017-06-02: 10 mg via ORAL
  Administered 2017-06-02 (×2): 5 mg via ORAL
  Administered 2017-06-02 – 2017-06-03 (×2): 10 mg via ORAL
  Administered 2017-06-03: 5 mg via ORAL
  Filled 2017-06-01 (×2): qty 1
  Filled 2017-06-01: qty 2
  Filled 2017-06-01: qty 1
  Filled 2017-06-01 (×3): qty 2
  Filled 2017-06-01: qty 1

## 2017-06-01 MED ORDER — METHOCARBAMOL 500 MG PO TABS
500.0000 mg | ORAL_TABLET | Freq: Four times a day (QID) | ORAL | Status: DC | PRN
  Administered 2017-06-01: 500 mg via ORAL
  Filled 2017-06-01: qty 1

## 2017-06-01 MED ORDER — DIPHENHYDRAMINE HCL 50 MG/ML IJ SOLN: 12.5000 mg | Freq: Four times a day (QID) | INTRAMUSCULAR | Status: DC | PRN

## 2017-06-01 MED ORDER — HYDROMORPHONE HCL-NACL 0.5-0.9 MG/ML-% IV SOSY
PREFILLED_SYRINGE | INTRAVENOUS | Status: AC
  Filled 2017-06-01: qty 1

## 2017-06-01 SURGICAL SUPPLY — 55 items
APPLICATOR ARISTA FLEXITIP XL (MISCELLANEOUS) ×6 IMPLANT
APPLIER CLIP 5 13 M/L LIGAMAX5 (MISCELLANEOUS) ×3
APPLIER CLIP ROT 10 11.4 M/L (STAPLE)
BANDAGE ADH SHEER 1  50/CT (GAUZE/BANDAGES/DRESSINGS) ×12 IMPLANT
BENZOIN TINCTURE PRP APPL 2/3 (GAUZE/BANDAGES/DRESSINGS) IMPLANT
CABLE HIGH FREQUENCY MONO STRZ (ELECTRODE) ×3 IMPLANT
CHLORAPREP W/TINT 26ML (MISCELLANEOUS) ×3 IMPLANT
CLIP APPLIE 5 13 M/L LIGAMAX5 (MISCELLANEOUS) ×1 IMPLANT
CLIP APPLIE ROT 10 11.4 M/L (STAPLE) IMPLANT
CLIP LIGATING HEMO O LOK GREEN (MISCELLANEOUS) IMPLANT
CLOSURE WOUND 1/2 X4 (GAUZE/BANDAGES/DRESSINGS) ×1
COVER MAYO STAND STRL (DRAPES) IMPLANT
COVER SURGICAL LIGHT HANDLE (MISCELLANEOUS) ×3 IMPLANT
DECANTER SPIKE VIAL GLASS SM (MISCELLANEOUS) ×3 IMPLANT
DERMABOND ADVANCED (GAUZE/BANDAGES/DRESSINGS)
DERMABOND ADVANCED .7 DNX12 (GAUZE/BANDAGES/DRESSINGS) IMPLANT
DRAPE C-ARM 42X120 X-RAY (DRAPES) IMPLANT
DRSG TEGADERM 2-3/8X2-3/4 SM (GAUZE/BANDAGES/DRESSINGS) IMPLANT
DRSG TELFA 3X8 NADH (GAUZE/BANDAGES/DRESSINGS) ×3 IMPLANT
ELECT PENCIL ROCKER SW 15FT (MISCELLANEOUS) IMPLANT
ELECT REM PT RETURN 15FT ADLT (MISCELLANEOUS) ×3 IMPLANT
ENDOLOOP SUT PDS II  0 18 (SUTURE) ×6
ENDOLOOP SUT PDS II 0 18 (SUTURE) ×3 IMPLANT
EVACUATOR SMOKE 8.L (FILTER) ×3 IMPLANT
GAUZE SPONGE 2X2 8PLY STRL LF (GAUZE/BANDAGES/DRESSINGS) ×1 IMPLANT
GAUZE SPONGE 4X4 12PLY STRL (GAUZE/BANDAGES/DRESSINGS) ×3 IMPLANT
GLOVE BIO SURGEON STRL SZ7.5 (GLOVE) ×3 IMPLANT
GLOVE INDICATOR 8.0 STRL GRN (GLOVE) ×3 IMPLANT
GOWN STRL REUS W/TWL XL LVL3 (GOWN DISPOSABLE) ×9 IMPLANT
GRASPER SUT TROCAR 14GX15 (MISCELLANEOUS) IMPLANT
HEMOSTAT ARISTA ABSORB 3G PWDR (MISCELLANEOUS) ×3 IMPLANT
HEMOSTAT SNOW SURGICEL 2X4 (HEMOSTASIS) IMPLANT
KIT BASIN OR (CUSTOM PROCEDURE TRAY) ×3 IMPLANT
L-HOOK LAP DISP 36CM (ELECTROSURGICAL)
LHOOK LAP DISP 36CM (ELECTROSURGICAL) IMPLANT
POUCH RETRIEVAL ECOSAC 10 (ENDOMECHANICALS) ×1 IMPLANT
POUCH RETRIEVAL ECOSAC 10MM (ENDOMECHANICALS) ×2
SCISSORS LAP 5X35 DISP (ENDOMECHANICALS) ×3 IMPLANT
SET CHOLANGIOGRAPH MIX (MISCELLANEOUS) IMPLANT
SET IRRIG TUBING LAPAROSCOPIC (IRRIGATION / IRRIGATOR) ×3 IMPLANT
SLEEVE XCEL OPT CAN 5 100 (ENDOMECHANICALS) ×6 IMPLANT
SPONGE GAUZE 2X2 STER 10/PKG (GAUZE/BANDAGES/DRESSINGS) ×2
STAPLER VISISTAT 35W (STAPLE) ×3 IMPLANT
STRIP CLOSURE SKIN 1/2X4 (GAUZE/BANDAGES/DRESSINGS) ×2 IMPLANT
SUT MNCRL AB 4-0 PS2 18 (SUTURE) ×3 IMPLANT
SUT NOVA NAB GS-21 0 18 T12 DT (SUTURE) ×3 IMPLANT
SUT VICRYL 0 TIES 12 18 (SUTURE) ×3 IMPLANT
SUT VICRYL 0 UR6 27IN ABS (SUTURE) IMPLANT
TOWEL OR 17X26 10 PK STRL BLUE (TOWEL DISPOSABLE) ×3 IMPLANT
TOWEL OR NON WOVEN STRL DISP B (DISPOSABLE) ×3 IMPLANT
TRAY LAPAROSCOPIC (CUSTOM PROCEDURE TRAY) ×3 IMPLANT
TROCAR BLADELESS OPT 5 100 (ENDOMECHANICALS) ×3 IMPLANT
TROCAR XCEL BLUNT TIP 100MML (ENDOMECHANICALS) IMPLANT
TROCAR XCEL NON-BLD 11X100MML (ENDOMECHANICALS) IMPLANT
TUBING INSUF HEATED (TUBING) ×3 IMPLANT

## 2017-06-01 NOTE — Op Note (Signed)
Crystal Yoder [id number] 01-05-1946 06/01/2017  Laparoscopic Cholecystectomy  Procedure Note  Indications: This patient presents with symptomatic gallbladder disease and will undergo laparoscopic cholecystectomy. The patient had had a prior midline incision that was slightly to the left of her midline many years ago for splenic issues. 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 The risks, benefits, complications, treatment options, and expected outcomes were discussed with the patient. The possibilities of reaction to medication, pulmonary aspiration, perforation of viscus, bleeding, recurrent infection, finding a normal gallbladder, the need for additional procedures, failure to diagnose a condition, the possible need to convert to an open procedure, and creating a complication requiring transfusion or operation were discussed with the patient. The likelihood of improving the patient's symptoms with return to their baseline status is good.  The patient and/or family concurred with the proposed plan, giving informed consent. The site of surgery properly noted. The patient was taken to Operating Room, identified as Crystal Yoder and the procedure verified as Laparoscopic Cholecystectomy with Intraoperative Cholangiogram. A Time Out was held and the above information confirmed. Antibiotic prophylaxis was administered.   Prior to the induction of general anesthesia, antibiotic prophylaxis was administered. General endotracheal anesthesia was then administered and tolerated well. After the induction,  the abdomen was prepped with Chloraprep and draped in the sterile fashion. The patient was positioned in the supine position.  Because the patient had had a large midline incision that was slightly to the left and reported spleen surgery in the 1960s I elected to gain access to her abdomen using the Optiview technique in the right upper quadrant. A small incision was made just below the right subcostal margin in the midclavicular line. Then using a 0 5 mm laparoscope through a 5 mm trocar advanced through all layers of the abdominal wall into the abdomen. Pneumoperitoneum was smoothly established up to a patient pressure 15 mmHg. There were no significant adhesions to the anterior abdominal wall.  The patient was placed in reverse Trendelenburg and rotated to the left. A 5 mm trocar was placed slightly above the umbilicus under direct visualization. Another 5 mm trochar was placed in the right lateral abdominal wall and an 11 mm trocar was placed in the subxiphoid position all under direct visualization.   There was omentum covering the gallbladder which was peeled away revealing a very thick walled distended gallbladder. Before able to grasp the gallbladder I had to aspirate it.  The gallbladder was identified, the fundus grasped and retracted cephalad. Adhesions were lysed bluntly and with the electrocautery where indicated, taking care not to injure any adjacent organs or viscus. the thick inflammatory rind around the body and infundibulum of the gallbladder was gently peeled back with a suction irrigator catheter. The infundibulum was grasped and retracted laterally, exposing the peritoneum overlying the triangle of Calot. This was then divided and exposed in a blunt fashion.given the severe inflammation in this area a careful dissection was commenced using blunt dissection with the suction irrigator catheter along with a [employer] as well as a right angle. Eventually  A critical view of the  cystic duct and cystic artery was obtained.  The cystic  duct was clearly identified and bluntly dissected circumferentially.  the cystic duct was visualized as it entered the infundibulum of the gallbladder. The cystic duct appeared a little bit dilated. A large critical view was obtained. I elected not to do a cholangiogram given the inflammation of the cystic duct. Given how dilated the cystic duct was going into the gallbladder I elected to transect the gallbladder with appear EndoShears at infundibulum. A PDS Endoloop was placed around the infundibulum to prevent any bilious spillage and gallstone spillage. Another PDS Endoloop was then placed around the cystic duct stump ensuring that it was not placed 2 proximally.    The gallbladder was dissected from the liver bed in retrograde fashion with the electrocautery.  the patient had a very large gallbladder and it took a while to mobilize it from the liver surface. The back wall the gallbladder was intimately adhered to the liver capsule. There is diffuse oozing from the liver surface after removal of the gallbladder. Hemostasis was eventually achieved after electrocautery and copious irrigation. I ended up placing Arista powder into the gallbladder fossa. The gallbladder was removed and placed in an Ecco sac.  The gallbladder and Ecco sac were then removed through subxiphoid port site. I did have to enlarge the skin incision in the subxiphoid position along with enlarging the fascia defect in order to extract the gallbladder given its large size. Before I could reestablish pneumoperitoneum several interrupted 0 Vicryl sutures were placed in the fashion this location. The pneumoperitoneum was reestablished and placed 5 additional interrupted 0 Vicryl sutures using the PMI suture passer with laparoscopic guidance.  The liver bed was inspected. Hemostasis was still present.  We again inspected the right upper quadrant for hemostas. Subxiphoid area was inspected  and there was no air leak and nothing trapped within the closure. Pneumoperitoneum was released as we removed the trocars.  4-0 Monocryl was used to close the skin.   Benzoin, steri-strips, and clean dressings were applied. Given how dirty the gallbladder was and there was a hole made in the echo sac on extraction of the gallbladder through the subxiphoid incision I irrigated the subxiphoid incision with saline and reapproximated the skin with skin staples in place Telfa wicks between the skin staples followed by a 4 x 4 and an occlusive dressing.  The patient was then extubated and brought to the recovery room in stable condition. Instrument, sponge, and needle counts were correct at closure and at the conclusion of the case.   Findings: Cholecystitis with Cholelithiasis  Estimated Blood Loss: 150 mL         Drains: none         Specimens: Gallbladder           Complications: None; patient tolerated the procedure well.         Disposition: PACU - hemodynamically stable.         Condition: stable  Mary Sella. Andrey Campanile, MD, FACS General, Bariatric, & Minimally Invasive Surgery [facility], [place]

## 2017-06-01 NOTE — Anesthesia Procedure Notes (Signed)
Procedure Name: Intubation Date/Time: 06/01/2017 12:10 PM Performed by: Minerva Ends Pre-anesthesia Checklist: Patient identified, Emergency Drugs available, Suction available and Patient being monitored Patient Re-evaluated:Patient Re-evaluated prior to induction Oxygen Delivery Method: Circle System Utilized Preoxygenation: Pre-oxygenation with 100% oxygen Induction Type: IV induction, Rapid sequence and Cricoid Pressure applied Ventilation: Mask ventilation without difficulty Laryngoscope Size: Miller and 2 Grade View: Grade I Tube type: Oral Tube size: 7.0 mm Number of attempts: 1 Airway Equipment and Method: Stylet and Oral airway Placement Confirmation: ETT inserted through vocal cords under direct vision,  positive ETCO2 and breath sounds checked- equal and bilateral Secured at: 21 cm Tube secured with: Tape Dental Injury: Teeth and Oropharynx as per pre-operative assessment  Comments: IV induction by Foster---intubation AM CRNA -- atraumatic--- teeth and mouth as preop--- upon exam--- front teeth with small chips prior to laryngoscopy--- bilat BS Malen Gauze

## 2017-06-01 NOTE — Anesthesia Preprocedure Evaluation (Signed)
Anesthesia Evaluation  Patient identified by MRN, date of birth, ID band Patient awake    Reviewed: Allergy & Precautions, H&P , NPO status , Patient's Chart, lab work & pertinent test results, reviewed documented beta blocker date and time   Airway Mallampati: III  TM Distance: >3 FB Neck ROM: Full    Dental no notable dental hx. (+) Teeth Intact, Dental Advisory Given   Pulmonary neg pulmonary ROS, shortness of breath, sleep apnea , COPD, former smoker,    Pulmonary exam normal breath sounds clear to auscultation       Cardiovascular hypertension, On Medications and On Home Beta Blockers + Peripheral Vascular Disease   Rhythm:Regular Rate:Normal  Cardiomyopathy   Neuro/Psych negative neurological ROS  negative psych ROS   GI/Hepatic negative GI ROS, Neg liver ROS, GERD  Medicated and Controlled,  Endo/Other  negative endocrine ROS  Renal/GU negative Renal ROS  negative genitourinary   Musculoskeletal   Abdominal   Peds  Hematology negative hematology ROS (+)   Anesthesia Other Findings ECHO 11/17 Left ventricle:  The cavity size was moderately dilated. Wall thickness was increased in a pattern of mild LVH. Systolic function was mildly to moderately reduced. The estimated ejection fraction was in the range of 40% to 45%. Doppler parameters are consistent with abnormal left ventricular relaxation (grade 1 diastolic dysfunction). . Arthropathy, unspecified, site unspecified  . COPD  . Esophageal reflux  . Essential hypertension  . Obesity, unspecified  . Obstructive sleep apnea   . Other acute sinusitis  . Psoriasis  . Shortness of breath     Reproductive/Obstetrics negative OB ROS                             Anesthesia Physical  Anesthesia Plan  ASA: III  Anesthesia Plan: General   Post-op Pain Management:    Induction: Intravenous  PONV Risk Score and Plan:    Airway Management Planned: Oral ETT  Additional Equipment:   Intra-op Plan:   Post-operative Plan: Extubation in OR  Informed Consent: I have reviewed the patients History and Physical, chart, labs and discussed the procedure including the risks, benefits and alternatives for the proposed anesthesia with the patient or authorized representative who has indicated his/her understanding and acceptance.   Dental advisory given  Plan Discussed with: CRNA  Anesthesia Plan Comments: (  )        Anesthesia Quick Evaluation

## 2017-06-01 NOTE — Anesthesia Postprocedure Evaluation (Signed)
Anesthesia Post Note  Patient: Crystal Yoder  Procedure(s) Performed: Procedure(s) (LRB): LAPAROSCOPIC CHOLECYSTECTOMY (N/A)     Patient location during evaluation: PACU Anesthesia Type: General Level of consciousness: awake and alert and oriented Pain management: pain level controlled Vital Signs Assessment: post-procedure vital signs reviewed and stable Respiratory status: spontaneous breathing, nonlabored ventilation, respiratory function stable and patient connected to nasal cannula oxygen Cardiovascular status: blood pressure returned to baseline and stable Postop Assessment: no signs of nausea or vomiting Anesthetic complications: no    Last Vitals:  Vitals:   06/01/17 1615 06/01/17 1630  BP: 124/86 138/84  Pulse: 82 74  Resp: 19 17  Temp:      Last Pain:  Vitals:   06/01/17 1545  TempSrc:   PainSc: 9                  [name]

## 2017-06-01 NOTE — Progress Notes (Signed)
PROGRESS NOTE    Crystal Yoder  ZOX:[id number] DOB: Mar 18, 1946 DOA: 05/30/2017 PCP: Laurann Montana, MD     Brief Narrative:  Crystal Yoder is a 71 yo female with past medical hx NICM EF 45%, HTN, COPD, GERD, OSA, and morbid obesity who presented to the ED with 1 day history of diffuse abdominal pain. This was essentially constant pain, severe, "excruciating" in intensity, hard to localize (but she mostly points to the epigastrium), and associated with bloating. She had a small bowel movement that did not relieve the pain, finally decided she had diverticulitis like her father, and came to the ER. Work up in the ED revealed cholelithiasis with mild pericholecystic free fluid, and dilatation of the common bile duct to 1.2 cm in diameter, raising concern for distal obstruction, gallbladder wall thickening, with cholelithiasis and sludge in the gallbladder. General surgery was consulted.   Assessment & Plan:   Principal Problem:   Acute cholecystitis Active Problems:   Obstructive sleep apnea   Cardiomyopathy (HCC)   Essential hypertension   Acute calculous cholecystitis  Acute cholecystitis -MRCP without evidence of choledocholithiasis  -OR for cholecystectomy. General surgery consulting   UTI POA -Urine culture pending, currently showing >100,000 GNR  -Continue empiric rocephin   HTN -Cozaar, coreg   NICM -EF 45%, grade 1 diastolic dysfunction. Follows with Dr. Tenny Craw, cardiology  -Coreg  GERD -PPI   Nodule on Chest CT -Follow up with PCP in 1 year  OSA -Failed CPAP   Chronic cough secondary to COPD, allergic rhinitis, GERD -Follows with Dr. Maple Hudson, pulmonology    DVT prophylaxis: lovenox Code Status: full Family Communication: husband at bedside  Disposition Plan: pending post-op course   Consultants:   CCS  Procedures:   None  Antimicrobials:  Anti-infectives    Start     Dose/Rate Route Frequency Ordered Stop   06/01/17 0800  cefTRIAXone (ROCEPHIN) 1 g  in dextrose 5 % 50 mL IVPB     1 g 100 mL/hr over 30 Minutes Intravenous Every 24 hours 05/31/17 1207     05/31/17 0630  cefTRIAXone (ROCEPHIN) 2 g in dextrose 5 % 50 mL IVPB  Status:  Discontinued     2 g 100 mL/hr over 30 Minutes Intravenous Every 24 hours 05/31/17 0602 05/31/17 1207   05/31/17 0100  ciprofloxacin (CIPRO) IVPB 400 mg     400 mg 200 mL/hr over 60 Minutes Intravenous  Once 05/31/17 0053 05/31/17 0323       Subjective: Feeling better this morning, continues to have abdominal pain. No chest pain, SOB, N/V.   Objective: Vitals:   05/31/17 1728 05/31/17 2112 05/31/17 2122 06/01/17 0458  BP:  (!) 133/45  (!) 124/50  Pulse: 72 84  68  Resp:  17  17  Temp:  98.6 F (37 C)  99.2 F (37.3 C)  TempSrc:  Oral  Oral  SpO2:  (!) 87% 98% 97%  Weight:      Height:        Intake/Output Summary (Last 24 hours) at 06/01/17 1004 Last data filed at 06/01/17 0600  Gross per 24 hour  Intake          2067.08 ml  Output                0 ml  Net          2067.08 ml   Filed Weights   05/30/17 2002  Weight: 120.2 kg (265 lb)    Examination:  General  exam: Appears calm and comfortable  Respiratory system: Clear to auscultation. Respiratory effort normal. Cardiovascular system: S1 & S2 heard, RRR. No JVD, murmurs, rubs, gallops or clicks. No pedal edema. Gastrointestinal system: Abdomen is nondistended, soft and +TTP RUQ. No organomegaly or masses felt. Normal bowel sounds heard. Central nervous system: Alert and oriented. No focal neurological deficits. Extremities: Symmetric  Skin: No rashes, lesions or ulcers Psychiatry: Judgement and insight appear normal. Mood & affect appropriate.   Data Reviewed: I have personally reviewed following labs and imaging studies  CBC:  Recent Labs Lab 05/30/17 2145 06/01/17 0450  WBC 12.4* 19.7*  HGB 13.2 13.0  HCT 41.8 40.4  MCV 93.3 92.7  PLT 211 177   Basic Metabolic Panel:  Recent Labs Lab 05/30/17 2145 06/01/17 0450    NA 141 141  K 4.6 4.1  CL 108 108  CO2 24 26  GLUCOSE 141* 113*  BUN 18 11  CREATININE 0.77 0.66  CALCIUM 8.9 8.6*   GFR: Estimated Creatinine Clearance: 89.4 mL/min (by C-G formula based on SCr of 0.66 mg/dL). Liver Function Tests:  Recent Labs Lab 05/30/17 2145 06/01/17 0450  AST 17 18  ALT 14 14  ALKPHOS 65 47  BILITOT 0.4 0.7  PROT 7.8 6.5  ALBUMIN 4.1 3.2*    Recent Labs Lab 05/30/17 2145  LIPASE 24   No results for input(s): AMMONIA in the last 168 hours. Coagulation Profile: No results for input(s): INR, PROTIME in the last 168 hours. Cardiac Enzymes: No results for input(s): CKTOTAL, CKMB, CKMBINDEX, TROPONINI in the last 168 hours. BNP (last 3 results) No results for input(s): PROBNP in the last 8760 hours. HbA1C: No results for input(s): HGBA1C in the last 72 hours. CBG: No results for input(s): GLUCAP in the last 168 hours. Lipid Profile: No results for input(s): CHOL, HDL, LDLCALC, TRIG, CHOLHDL, LDLDIRECT in the last 72 hours. Thyroid Function Tests: No results for input(s): TSH, T4TOTAL, FREET4, T3FREE, THYROIDAB in the last 72 hours. Anemia Panel: No results for input(s): VITAMINB12, FOLATE, FERRITIN, TIBC, IRON, RETICCTPCT in the last 72 hours. Sepsis Labs: No results for input(s): PROCALCITON, LATICACIDVEN in the last 168 hours.  Recent Results (from the past 240 hour(s))  Urine culture     Status: Abnormal (Preliminary result)   Collection Time: 05/31/17 12:53 AM  Result Value Ref Range Status   Specimen Description URINE, CLEAN CATCH  Final   Special Requests NONE  Final   Culture >=100,000 COLONIES/mL GRAM NEGATIVE RODS (A)  Final   Report Status PENDING  Incomplete       Radiology Studies: Ct Abdomen Pelvis W Contrast  Result Date: 05/31/2017 CLINICAL DATA:  Initial evaluation for acute abdominal pain, distension. EXAM: CT ABDOMEN AND PELVIS WITH CONTRAST TECHNIQUE: Multidetector CT imaging of the abdomen and pelvis was performed  using the standard protocol following bolus administration of intravenous contrast. CONTRAST:  ISOVUE-300 IOPAMIDOL (ISOVUE-300) INJECTION 61% COMPARISON:  None available. FINDINGS: Lower chest: Scattered patchy and linear atelectatic changes present within the visualized lung bases. Visualized lung bases are otherwise clear. 5 mm subpleural nodule noted at the peripheral right middle lobe (series 4, image 4), indeterminate. Mild cardiomegaly, partially visualized. Hepatobiliary: Liver demonstrates a normal contrast enhanced appearance. Subcentimeter focus of hyperdense Lee/gas noted within the gallbladder lumen, likely reflecting internal stone. Gallbladder wall somewhat ill-defined with suggestion of pericholecystic free fluid, suggesting possible mild inflammation. No biliary dilatation. Pancreas: Pancreas within normal limits. Spleen: Somewhat irregular cystic lesion measuring 3.6 cm with scattered  calcifications noted within the spleen, of doubtful significance. Spleen otherwise unremarkable. Adrenals/Urinary Tract: Adrenal glands within normal limits. Kidneys equal in size with symmetric enhancement. Scattered cyst noted within the right kidney, largest of which measures 3.6 cm. No nephrolithiasis, hydronephrosis, or focal enhancing renal mass. No hydroureter. Bladder largely decompressed without acute abnormality. Stomach/Bowel: Stomach within normal limits. No evidence for bowel obstruction. Appendix normal. Sigmoid diverticulosis without evidence for acute diverticulitis. No acute inflammatory changes seen about the bowels. Vascular/Lymphatic: Normal intravascular enhancement seen throughout the intra-abdominal aorta and its branch vessels. Mild aorto bi-iliac atherosclerotic disease. No adenopathy. Reproductive: Uterus is absent. Ovaries within normal limits. Pelvic support device in place. Other: No free intraperitoneal air. No free fluid or ascites. Note made of a few varices within the left  abdomen. Musculoskeletal: No acute osseus abnormality. No worrisome lytic or blastic osseous lesions. Multilevel degenerate spondylolysis noted within the visualized spine, greatest at L2-3. IMPRESSION: 1. Cholelithiasis with mild pericholecystic free fluid, which may reflect sequelae of acute biliary pathology. Correlation with LFTs suggested. Additionally, further evaluation with dedicated right upper quadrant ultrasound may be helpful for further evaluation as warranted. 2. No other acute intra-abdominal or pelvic process. 3. Sigmoid diverticulosis without evidence for acute diverticulitis. 4. Mild aorto bi-iliac atherosclerotic disease. 5. 5 mm right middle lobe pulmonary nodule, indeterminate. No follow-up needed if patient is low-risk. Non-contrast chest CT can be considered in 12 months if patient is high-risk. This recommendation follows the consensus statement: Guidelines for Management of Incidental Pulmonary Nodules Detected on CT Images: From the Fleischner Society 2017; Radiology 2017; 284:228-243. Electronically Signed   By: Rise Mu M.D.   On: 05/31/2017 00:54   Mr 3d Recon At Scanner  Result Date: 06/01/2017 CLINICAL DATA:  Cholelithiasis.  Common duct dilatation. EXAM: MRI ABDOMEN WITHOUT AND WITH CONTRAST (INCLUDING MRCP) TECHNIQUE: Multiplanar multisequence MR imaging of the abdomen was performed both before and after the administration of intravenous contrast. Heavily T2-weighted images of the biliary and pancreatic ducts were obtained, and three-dimensional MRCP images were rendered by post processing. CONTRAST:  20 cc of MultiHance. COMPARISON:  CT ultrasound of earlier in the day. FINDINGS: Mild to moderate motion degradation involves the precontrast imaging. The dynamic images are of relatively good quality. Lower chest: Mild cardiomegaly, without pericardial or pleural effusion. Hepatobiliary: No focal liver lesion. Borderline to mild intrahepatic biliary duct dilatation. The  common duct is upper normal in size, including at 9 mm on image 61/series 4. No evidence of choledocholithiasis. Multiple gallstones within a distended gallbladder. Moderate gallbladder wall thickening and pericholecystic interstitial infiltration. Pancreas:  Normal, without mass or ductal dilatation. Spleen: 3.2 cm mildly septated cystic lesion within the subcapsular medial spleen, including on image 30/ series 16109. Adrenals/Urinary Tract: Normal adrenal glands. Normal left kidney. Right renal cysts. No hydronephrosis. Stomach/Bowel: Normal stomach and abdominal bowel loops. Vascular/Lymphatic: Normal caliber of the aorta and branch vessels. No retroperitoneal or retrocrural adenopathy. Other:  No ascites. Musculoskeletal: No acute osseous abnormality. IMPRESSION: 1. Motion degraded exam. 2. Cholelithiasis with gallbladder wall thickening and pericholecystic interstitial thickening. Given absence of sonographic Murphy sign on ultrasound, findings are suspicious for chronic cholecystitis. 3. Borderline biliary duct dilatation, without evidence of choledocholithiasis. 4. 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 Presuming no left upper quadrant symptoms or fever, of doubtful clinical significance. Electronically Signed   By: Jeronimo Greaves M.D.   On: 06/01/2017 07:51   Mr Abdomen Mrcp Vivien Rossetti Contast  Result Date: 06/01/2017 CLINICAL DATA:  Cholelithiasis.  Common duct dilatation. EXAM: MRI ABDOMEN WITHOUT AND WITH CONTRAST (INCLUDING MRCP) TECHNIQUE: Multiplanar multisequence MR imaging of the abdomen was performed both before and after the administration of intravenous contrast. Heavily T2-weighted images of the biliary and pancreatic ducts were obtained, and three-dimensional MRCP images were rendered by post processing. CONTRAST:  20 cc of MultiHance. COMPARISON:  CT ultrasound of earlier in the day. FINDINGS: Mild to moderate motion degradation involves the precontrast imaging. The dynamic images  are of relatively good quality. Lower chest: Mild cardiomegaly, without pericardial or pleural effusion. Hepatobiliary: No focal liver lesion. Borderline to mild intrahepatic biliary duct dilatation. The common duct is upper normal in size, including at 9 mm on image 61/series 4. No evidence of choledocholithiasis. Multiple gallstones within a distended gallbladder. Moderate gallbladder wall thickening and pericholecystic interstitial infiltration. Pancreas:  Normal, without mass or ductal dilatation. Spleen: 3.2 cm mildly septated cystic lesion within the subcapsular medial spleen, including on image 30/ series 22025. Adrenals/Urinary Tract: Normal adrenal glands. Normal left kidney. Right renal cysts. No hydronephrosis. Stomach/Bowel: Normal stomach and abdominal bowel loops. Vascular/Lymphatic: Normal caliber of the aorta and branch vessels. No retroperitoneal or retrocrural adenopathy. Other:  No ascites. Musculoskeletal: No acute osseous abnormality. IMPRESSION: 1. Motion degraded exam. 2. Cholelithiasis with gallbladder wall thickening and pericholecystic interstitial thickening. Given absence of sonographic Murphy sign on ultrasound, findings are suspicious for chronic cholecystitis. 3. Borderline biliary duct dilatation, without evidence of choledocholithiasis. 4. 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 Presuming no left upper quadrant symptoms or fever, of doubtful clinical significance. Electronically Signed   By: Jeronimo Greaves M.D.   On: 06/01/2017 07:51   US Abdomen Limited Ruq  Result Date: 05/31/2017 CLINICAL DATA:  Acute onset of right upper quadrant abdominal pain. Initial encounter. EXAM: ULTRASOUND ABDOMEN LIMITED RIGHT UPPER QUADRANT COMPARISON:  CT of the abdomen and pelvis performed earlier today at 12:32 a.m. FINDINGS: Gallbladder: Stones and sludge are noted within the gallbladder. There is gallbladder wall thickening, measuring up to 4 mm. No pericholecystic fluid or  ultrasonographic Murphy's sign is elicited. Common bile duct: Diameter: 1.2 cm, dilated for the patient's age. Liver: No focal lesion identified. Diffusely increased parenchymal echogenicity and coarsened echotexture, compatible with fatty infiltration. IMPRESSION: 1. Dilatation of the common bile duct to 1.2 cm in diameter, raising concern for distal obstruction. Would correlate with LFTs, and consider MRCP or ERCP for further evaluation, as deemed clinically appropriate. 2. Gallbladder wall thickening, with cholelithiasis and sludge in the gallbladder. This may reflect underlying obstruction, given the dilated common bile duct. No ultrasonographic Murphy's sign elicited. 3. Diffuse fatty infiltration within the liver. Electronically Signed   By: Roanna Raider M.D.   On: 05/31/2017 03:18      Scheduled Meds: . carvedilol  3.125 mg Oral BID WC  . enoxaparin (LOVENOX) injection  60 mg Subcutaneous Q24H  . losartan  50 mg Oral Daily  . pantoprazole (PROTONIX) IV  40 mg Intravenous Daily   Continuous Infusions: . sodium chloride 75 mL/hr at 05/31/17 1728  . cefTRIAXone (ROCEPHIN)  IV Stopped (06/01/17 0940)     LOS: 1 day    Time spent: 30 minutes   Noralee Stain, DO [facility] [email or web address] Password [email or web address] 06/01/2017, 10:04 AM

## 2017-06-01 NOTE — H&P (View-Only) (Signed)
Reason for Consult:cholecystitis Referring Physician: Katia, Crystal Yoder is an 71 y.o. female.  HPI: 71 yo female with 1 month of malaise and 1 day of abdominal pain. Pain was diffuse and constant. She notes eating before the pain started. She denies nausea and vomiting. She had a similar pain 1 month ago and had a bowel movement with relief. Yesterday she had a small bowel movement but with no relief.   Past Medical History:  Diagnosis Date  . Arthritis   . Arthropathy, unspecified, site unspecified   . COPD (chronic obstructive pulmonary disease) ([place])   . Esophageal reflux   . Essential hypertension 05/31/2017  . Obesity, unspecified   . Obstructive sleep apnea (adult) (pediatric)   . Other acute sinusitis   . Other primary cardiomyopathies   . Psoriasis   . Shortness of breath   . Unspecified venous (peripheral) insufficiency     Past Surgical History:  Procedure Laterality Date  . ABDOMINAL HYSTERECTOMY  2011  . BLADDER SURGERY  2011   tac=ant/post  . IRRIGATION AND DEBRIDEMENT OF WOUND WITH SPLIT THICKNESS SKIN GRAFT Right 01/25/2014   Procedure: RIGHT FOOT IRRIGATION AND DEBRIDEMENT WITH ACELL/SPLICK THICKNESS SKINGRAFT WITH VAC;  Surgeon: Theodoro Kos, DO;  Location: [facility];  Service: Plastics;  Laterality: Right;  . PELVIC FLOOR REPAIR    . REPLACEMENT TOTAL KNEE     rt and lt  . 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 Prostate cancer Father   . Diverticulitis Father   . Diabetes Paternal Grandfather     Social History:  reports that she quit smoking about 37 years ago. Her smoking use included Cigarettes. She has a 5.00 pack-year smoking history. She has never used smokeless tobacco. She reports that she drinks alcohol. She reports that she does not use drugs.  Allergies:  Allergies  Allergen Reactions  . Penicillins Hives and Other (See Comments)    Has patient had a PCN reaction causing  immediate rash, facial/tongue/throat swelling, SOB or lightheadedness with hypotension: No Has patient had a PCN reaction causing severe rash involving mucus membranes or skin necrosis: No Has patient had a PCN reaction that required hospitalization: No Has patient had a PCN reaction occurring within the last 10 years: No If all of the above answers are "NO", then may proceed with Cephalosporin use.  . Sulfa Antibiotics Diarrhea and Nausea And Vomiting  . Sulfonamide Derivatives Diarrhea and Nausea And Vomiting    Medications: I have reviewed the patient's current medications.  Results for orders placed or performed during the hospital encounter of 05/30/17 (from the past 48 hour(s))  Lipase, blood     Status: None   Collection Time: 05/30/17  9:45 PM  Result Value Ref Range   Lipase 24 11 - 51 U/L  Comprehensive metabolic panel     Status: Abnormal   Collection Time: 05/30/17  9:45 PM  Result Value Ref Range   Sodium 141 135 - 145 mmol/L   Potassium 4.6 3.5 - 5.1 mmol/L   Chloride 108 101 - 111 mmol/L   CO2 24 22 - 32 mmol/L   Glucose, Bld 141 (H) 65 - 99 mg/dL   BUN 18 6 - 20 mg/dL   Creatinine, Ser 0.77 0.44 - 1.00 mg/dL   Calcium 8.9 8.9 - 10.3 mg/dL   Total Protein 7.8 6.5 - 8.1 g/dL   Albumin 4.1 3.5 - 5.0  g/dL   AST 17 15 - 41 U/L   ALT 14 14 - 54 U/L   Alkaline Phosphatase 65 38 - 126 U/L   Total Bilirubin 0.4 0.3 - 1.2 mg/dL   GFR calc non Af Amer >60 >60 mL/min   GFR calc Af Amer >60 >60 mL/min    Comment: (NOTE) The eGFR has been calculated using the CKD EPI equation. This calculation has not been validated in all clinical situations. eGFR's persistently <60 mL/min signify possible Chronic Kidney Disease.    Anion gap 9 5 - 15  CBC     Status: Abnormal   Collection Time: 05/30/17  9:45 PM  Result Value Ref Range   WBC 12.4 (H) 4.0 - 10.5 K/uL   RBC 4.48 3.87 - 5.11 MIL/uL   Hemoglobin 13.2 12.0 - 15.0 g/dL   HCT 41.8 36.0 - 46.0 %   MCV 93.3 78.0 - 100.0 fL    MCH 29.5 26.0 - 34.0 pg   MCHC 31.6 30.0 - 36.0 g/dL   RDW 14.4 11.5 - 15.5 %   Platelets 211 150 - 400 K/uL  Urinalysis, Routine w reflex microscopic     Status: Abnormal   Collection Time: 05/30/17 11:39 PM  Result Value Ref Range   Color, Urine YELLOW YELLOW   APPearance HAZY (A) CLEAR   Specific Gravity, Urine 1.021 1.005 - 1.030   pH 6.0 5.0 - 8.0   Glucose, UA NEGATIVE NEGATIVE mg/dL   Hgb urine dipstick NEGATIVE NEGATIVE   Bilirubin Urine NEGATIVE NEGATIVE   Ketones, ur 20 (A) NEGATIVE mg/dL   Protein, ur NEGATIVE NEGATIVE mg/dL   Nitrite POSITIVE (A) NEGATIVE   Leukocytes, UA MODERATE (A) NEGATIVE   RBC / HPF 0-5 0 - 5 RBC/hpf   WBC, UA TOO NUMEROUS TO COUNT 0 - 5 WBC/hpf   Bacteria, UA MANY (A) NONE SEEN   Squamous Epithelial / LPF 6-30 (A) NONE SEEN   Mucous PRESENT     Ct Abdomen Pelvis W Contrast  Result Date: 05/31/2017 CLINICAL DATA:  Initial evaluation for acute abdominal pain, distension. EXAM: CT ABDOMEN AND PELVIS WITH CONTRAST TECHNIQUE: Multidetector CT imaging of the abdomen and pelvis was performed using the standard protocol following bolus administration of intravenous contrast. CONTRAST:  148m ISOVUE-300 IOPAMIDOL (ISOVUE-300) INJECTION 61% COMPARISON:  None available. FINDINGS: Lower chest: Scattered patchy and linear atelectatic changes present within the visualized lung bases. Visualized lung bases are otherwise clear. 5 mm subpleural nodule noted at the peripheral right middle lobe (series 4, image 4), indeterminate. Mild cardiomegaly, partially visualized. Hepatobiliary: Liver demonstrates a normal contrast enhanced appearance. Subcentimeter focus of hyperdense Lee/gas noted within the gallbladder lumen, likely reflecting internal stone. Gallbladder wall somewhat ill-defined with suggestion of pericholecystic free fluid, suggesting possible mild inflammation. No biliary dilatation. Pancreas: Pancreas within normal limits. Spleen: Somewhat irregular cystic  lesion measuring 3.6 cm with scattered calcifications noted within the spleen, of doubtful significance. Spleen otherwise unremarkable. Adrenals/Urinary Tract: Adrenal glands within normal limits. Kidneys equal in size with symmetric enhancement. Scattered cyst noted within the right kidney, largest of which measures 3.6 cm. No nephrolithiasis, hydronephrosis, or focal enhancing renal mass. No hydroureter. Bladder largely decompressed without acute abnormality. Stomach/Bowel: Stomach within normal limits. No evidence for bowel obstruction. Appendix normal. Sigmoid diverticulosis without evidence for acute diverticulitis. No acute inflammatory changes seen about the bowels. Vascular/Lymphatic: Normal intravascular enhancement seen throughout the intra-abdominal aorta and its branch vessels. Mild aorto bi-iliac atherosclerotic disease. No adenopathy. Reproductive: Uterus is absent.  Ovaries within normal limits. Pelvic support device in place. Other: No free intraperitoneal air. No free fluid or ascites. Note made of a few varices within the left abdomen. Musculoskeletal: No acute osseus abnormality. No worrisome lytic or blastic osseous lesions. Multilevel degenerate spondylolysis noted within the visualized spine, greatest at L2-3. IMPRESSION: 1. Cholelithiasis with mild pericholecystic free fluid, which may reflect sequelae of acute biliary pathology. Correlation with LFTs suggested. Additionally, further evaluation with dedicated right upper quadrant ultrasound may be helpful for further evaluation as warranted. 2. No other acute intra-abdominal or pelvic process. 3. Sigmoid diverticulosis without evidence for acute diverticulitis. 4. Mild aorto bi-iliac atherosclerotic disease. 5. 5 mm right middle lobe pulmonary nodule, indeterminate. No follow-up needed if patient is low-risk. Non-contrast chest CT can be considered in 12 months if patient is high-risk. This recommendation follows the consensus statement:  Guidelines for Management of Incidental Pulmonary Nodules Detected on CT Images: From the Fleischner Society 2017; Radiology 2017; 284:228-243. Electronically Signed   By: Rise Mu M.D.   On: 05/31/2017 00:54   US Abdomen Limited Ruq  Result Date: 05/31/2017 CLINICAL DATA:  Acute onset of right upper quadrant abdominal pain. Initial encounter. EXAM: ULTRASOUND ABDOMEN LIMITED RIGHT UPPER QUADRANT COMPARISON:  CT of the abdomen and pelvis performed earlier today at 12:32 a.m. FINDINGS: Gallbladder: Stones and sludge are noted within the gallbladder. There is gallbladder wall thickening, measuring up to 4 mm. No pericholecystic fluid or ultrasonographic Murphy's sign is elicited. Common bile duct: Diameter: 1.2 cm, dilated for the patient's age. Liver: No focal lesion identified. Diffusely increased parenchymal echogenicity and coarsened echotexture, compatible with fatty infiltration. IMPRESSION: 1. Dilatation of the common bile duct to 1.2 cm in diameter, raising concern for distal obstruction. Would correlate with LFTs, and consider MRCP or ERCP for further evaluation, as deemed clinically appropriate. 2. Gallbladder wall thickening, with cholelithiasis and sludge in the gallbladder. This may reflect underlying obstruction, given the dilated common bile duct. No ultrasonographic Murphy's sign elicited. 3. Diffuse fatty infiltration within the liver. Electronically Signed   By: Roanna Raider M.D.   On: 05/31/2017 03:18    Review of Systems  Constitutional: Negative for chills and fever.  HENT: Negative for hearing loss.   Eyes: Negative for blurred vision and double vision.  Respiratory: Negative for cough and hemoptysis.   Cardiovascular: Negative for chest pain and palpitations.  Gastrointestinal: Positive for abdominal pain and constipation. Negative for nausea and vomiting.  Genitourinary: Negative for dysuria and urgency.  Musculoskeletal: Negative for myalgias and neck pain.  Skin:  Negative for itching and rash.  Neurological: Negative for dizziness, tingling and headaches.  Endo/Heme/Allergies: Does not bruise/bleed easily.  Psychiatric/Behavioral: Negative for depression and suicidal ideas.   Blood pressure (!) 146/61, pulse 68, temperature 98.5 F (36.9 C), temperature source Oral, resp. rate 16, height 5\' 9"  (1.753 m), weight 120.2 kg (265 lb), SpO2 93 %. Physical Exam  Vitals reviewed. Constitutional: She is oriented to person, place, and time. She appears well-developed and well-nourished.  HENT:  Head: Normocephalic and atraumatic.  Eyes: Pupils are equal, round, and reactive to light. Conjunctivae and EOM are normal.  Neck: Normal range of motion. Neck supple.  Cardiovascular: Normal rate and regular rhythm.   Respiratory: Effort normal and breath sounds normal.  GI: Soft. Bowel sounds are normal. She exhibits no distension. There is tenderness in the right upper quadrant. There is no guarding.  Musculoskeletal: Normal range of motion.  Neurological: She is alert and oriented to person,  place, and time.  Skin: Skin is warm and dry.  Psychiatric: She has a normal mood and affect. Her behavior is normal.    Assessment/Plan: 70 yo female with findings consistent with acute cholecystitis vs choledocholithiasis. Her LFTs are normal but her [place] does show a larger than expected CBD. She has reproducible pain in the RUQ on exam. -agree with MRCP -recommended gallbladder removal prior to discharge given acute pain, inflammatory changes to gallbladder on imaging and potential choledocholithiasis.   Arta Bruce Dalal Livengood 05/31/2017, 12:29 PM

## 2017-06-01 NOTE — Transfer of Care (Signed)
Immediate Anesthesia Transfer of Care Note  Patient: Crystal Yoder  Procedure(s) Performed: Procedure(s): LAPAROSCOPIC CHOLECYSTECTOMY (N/A)  Patient Location: PACU  Anesthesia Type:General  Level of Consciousness: pateint uncooperative  Airway & Oxygen Therapy: Patient Spontanous Breathing and Patient connected to nasal cannula oxygen  Post-op Assessment: Report given to RN and Post -op Vital signs reviewed and stable  Post vital signs: Reviewed and stable  Last Vitals:  Vitals:   06/01/17 0458 06/01/17 1058  BP: (!) 124/50 (!) 155/88  Pulse: 68 74  Resp: 17 18  Temp: 37.3 C 37.3 C    Last Pain:  Vitals:   06/01/17 1058  TempSrc: Oral  PainSc: 7       Patients Stated Pain Goal: 0 (06/01/17 0800)  Complications: No apparent anesthesia complications

## 2017-06-01 NOTE — Interval H&P Note (Signed)
History and Physical Interval Note:  06/01/2017 11:25 AM  Crystal Yoder  has presented today for surgery, with the diagnosis of acute cholecystitis  The various methods of treatment have been discussed with the patient and family. After consideration of risks, benefits and other options for treatment, the patient has consented to  Procedure(s): LAPAROSCOPIC CHOLECYSTECTOMY WITH INTRAOPERATIVE CHOLANGIOGRAM (N/A) as a surgical intervention .  The patient's history has been reviewed, patient examined, no change in status, stable for surgery.  I have reviewed the patient's chart and labs.  Questions were answered to the patient's satisfaction.    MRI/MRCP mostly neg for CBD stone. So with normal LFTs will proceed with lap chole with ioc  I believe the patient's symptoms are consistent with gallbladder disease.  We discussed gallbladder disease.  I discussed laparoscopic cholecystectomy with IOC in detail.  The patient was shown diagrams detailing the procedure.  We discussed the risks and benefits of a laparoscopic cholecystectomy including, but not limited to bleeding, infection, injury to surrounding structures such as the intestine or liver, bile leak, retained gallstones, need to convert to an open procedure, prolonged diarrhea, blood clots such as  DVT, common bile duct injury, anesthesia risks, and possible need for additional procedures.  We discussed the typical post-operative recovery course. I explained that the likelihood of improvement of their symptoms is good.  We discussed the possibility of needing ERCP if the IOC is positive. We will also go in the right upper quadrant given her prior large midline incision.  Mary Sella. Andrey Campanile, MD, FACS General, Bariatric, & Minimally Invasive Surgery [facility], [place]   [facility] M

## 2017-06-02 DIAGNOSIS — K810 Acute cholecystitis: Secondary | ICD-10-CM | POA: Diagnosis not present

## 2017-06-02 LAB — URINE CULTURE

## 2017-06-02 LAB — BASIC METABOLIC PANEL
ANION GAP: 7 (ref 5–15)
BUN: 16 mg/dL (ref 6–20)
CO2: 27 mmol/L (ref 22–32)
Calcium: 8.3 mg/dL — ABNORMAL LOW (ref 8.9–10.3)
Chloride: 106 mmol/L (ref 101–111)
Creatinine, Ser: 0.82 mg/dL (ref 0.44–1.00)
GFR calc Af Amer: >60 mL/min (ref >60)
GLUCOSE: 110 mg/dL — AB (ref 65–99)
POTASSIUM: 4.7 mmol/L (ref 3.5–5.1)
Sodium: 140 mmol/L (ref 135–145)

## 2017-06-02 LAB — CBC WITH DIFFERENTIAL/PLATELET
Basophils Absolute: 0 10*3/uL (ref 0.0–0.1)
Basophils Relative: 0 %
EOS PCT: 0 %
Eosinophils Absolute: 0 10*3/uL (ref 0.0–0.7)
HCT: 37.8 % (ref 36.0–46.0)
HEMOGLOBIN: 11.5 g/dL — AB (ref 12.0–15.0)
LYMPHS ABS: 1.3 10*3/uL (ref 0.7–4.0)
LYMPHS PCT: 7 %
MCH: 29.3 pg (ref 26.0–34.0)
MCHC: 30.4 g/dL (ref 30.0–36.0)
MCV: 96.4 fL (ref 78.0–100.0)
MONO ABS: 2.1 10*3/uL — AB (ref 0.1–1.0)
Monocytes Relative: 12 %
NEUTROS ABS: 14.7 10*3/uL — AB (ref 1.7–7.7)
Neutrophils Relative %: 81 %
Platelets: 169 10*3/uL (ref 150–400)
RBC: 3.92 MIL/uL (ref 3.87–5.11)
RDW: 14.9 % (ref 11.5–15.5)
WBC: 18.2 10*3/uL — ABNORMAL HIGH (ref 4.0–10.5)

## 2017-06-02 MED ORDER — MORPHINE SULFATE (PF) 2 MG/ML IV SOLN: 1.0000 mg | INTRAVENOUS | Status: DC | PRN

## 2017-06-02 MED ORDER — CIPROFLOXACIN IN D5W 400 MG/200ML IV SOLN
400.0000 mg | Freq: Two times a day (BID) | INTRAVENOUS | Status: DC
  Administered 2017-06-02 – 2017-06-03 (×4): 400 mg via INTRAVENOUS
  Filled 2017-06-02 (×4): qty 200

## 2017-06-02 NOTE — Evaluation (Signed)
Physical Therapy Evaluation Patient Details Name: Crystal Yoder MRN: [id number] DOB: [birth year] Today's Date: 06/02/2017   History of Present Illness  71 yo female s/p lap cholectystectomy 06/01/17. Hx of HTN, COPD, obesity  Clinical Impression  On eval, pt required Min assist (+2 for equipment) for mobility. She walked ~65 feet with a RW. Pain rated 9/10 at rest and with activity. O2 sat 77% on RA (pt found with O2 off at start of session), 91% 2L O2 during ambulation. Will follow and progress activity as tolerated. Recommend daily ambulation with nursing assistance as well.     Follow Up Recommendations Home health PT;Supervision/Assistance - 24 hour    Equipment Recommendations  None recommended by PT    Recommendations for Other Services       Precautions / Restrictions Precautions Precautions: Fall Precaution Comments: abd surgery. monitor O2 Restrictions Weight Bearing Restrictions: No      Mobility  Bed Mobility Overal bed mobility: Needs Assistance Bed Mobility: Supine to Sit     Supine to sit: HOB elevated;Min guard     General bed mobility comments: Increased time. Cues for technique however pt performed task in her own manner. Task was effortful and painful for pt. Moderate reliance on bedrail.   Transfers Overall transfer level: Needs assistance Equipment used: Rolling walker (2 wheeled) Transfers: Sit to/from Stand Sit to Stand: Min assist;From elevated surface         General transfer comment: Assist to rise, stabilize, control descent. VCs safety, hand placement. Pt preferred to pull up on walker.   Ambulation/Gait Ambulation/Gait assistance: Min assist; +2 equipment Ambulation Distance (Feet): 65 Feet Assistive device: Rolling walker (2 wheeled) Gait Pattern/deviations: Step-through pattern;Decreased stride length;Drifts right/left     General Gait Details: Assist to stabilize pt and maneuver with RW. Remained on [place] O2 for ambulation-sat 91% on  2L. Slow gait speed. Cues for safety, proper RW use.   Stairs            Wheelchair Mobility    Modified Rankin (Stroke Patients Only)       Balance Overall balance assessment: Needs assistance         Standing balance support: Bilateral upper extremity supported Standing balance-Leahy Scale: Poor                               Pertinent Vitals/Pain Pain Assessment: 0-10 Pain Score: 9  Pain Location: abdomen Pain Descriptors / Indicators: Sore;Tender Pain Intervention(s): Limited activity within patient's tolerance;Repositioned    Home Living Family/patient expects to be discharged to:: Private residence Living Arrangements: Spouse/significant other Available Help at Discharge: Family Type of Home: House Home Access: Stairs to enter Entrance Stairs-Rails: Right Entrance Stairs-Number of Steps: 2 Home Layout: One level Home Equipment: Environmental consultant - 2 wheels;Cane - single point;Bedside commode      Prior Function Level of Independence: Independent               Hand Dominance        Extremity/Trunk Assessment   Upper Extremity Assessment Upper Extremity Assessment: Generalized weakness    Lower Extremity Assessment Lower Extremity Assessment: Generalized weakness    Cervical / Trunk Assessment Cervical / Trunk Assessment: Normal  Communication   Communication: No difficulties  Cognition Arousal/Alertness: Awake/alert;Suspect due to medications (drowsy) Behavior During Therapy: WFL for tasks assessed/performed Overall Cognitive Status: Within Functional Limits for tasks assessed  General Comments      Exercises     Assessment/Plan    PT Assessment Patient needs continued PT services  PT Problem List Decreased mobility;Decreased strength;Decreased activity tolerance;Decreased balance;Decreased knowledge of use of DME;Pain       PT Treatment Interventions DME  instruction;Gait training;Therapeutic activities;Therapeutic exercise;Patient/family education;Balance training;Functional mobility training    PT Goals (Current goals can be found in the Care Plan section)  Acute Rehab PT Goals Patient Stated Goal: less pain. home tomorrow.  PT Goal Formulation: With patient/family Time For Goal Achievement: 06/16/17 Potential to Achieve Goals: Good    Frequency Min 3X/week   Barriers to discharge        Co-evaluation               AM-PAC PT "6 Clicks" Daily Activity  Outcome Measure Difficulty turning over in bed (including adjusting bedclothes, sheets and blankets)?: A Lot Difficulty moving from lying on back to sitting on the side of the bed? : A Lot Difficulty sitting down on and standing up from a chair with arms (e.g., wheelchair, bedside commode, etc,.)?: A Lot Help needed moving to and from a bed to chair (including a wheelchair)?: A Little Help needed walking in hospital room?: A Little Help needed climbing 3-5 steps with a railing? : A Lot 6 Click Score: 14    End of Session Equipment Utilized During Treatment: Gait belt;Oxygen Activity Tolerance: Patient limited by pain Patient left: in chair;with call bell/phone within reach;with family/visitor present;with nursing/sitter in room   PT Visit Diagnosis: Muscle weakness (generalized) (M62.81);Difficulty in walking, not elsewhere classified (R26.2)    Time: 2956-2130 PT Time Calculation (min) (ACUTE ONLY): 35 min   Charges:   PT Evaluation $PT Eval Low Complexity: 1 Low PT Treatments $Gait Training: 8-22 mins   PT G Codes:   PT G-Codes **NOT FOR INPATIENT CLASS** Functional Assessment Tool Used: AM-PAC 6 Clicks Basic Mobility;Clinical judgement Functional Limitation: Mobility: Walking and moving around Mobility: Walking and Moving Around Current Status (Q6578): At least 20 percent but less than 40 percent impaired, limited or restricted Mobility: Walking and Moving  Around Goal Status [phone]): At least 1 percent but less than 20 percent impaired, limited or restricted     Rebeca Alert, MPT Pager: [phone]

## 2017-06-02 NOTE — Care Management Obs Status (Signed)
MEDICARE OBSERVATION STATUS NOTIFICATION   Patient Details  Name: Crystal Yoder MRN: [id number] Date of Birth: [birth year]   Medicare Observation Status Notification Given:  Yes    Alexis Goodell, RN 06/02/2017, 9:37 AM

## 2017-06-02 NOTE — Progress Notes (Signed)
PROGRESS NOTE    Crystal Yoder  WUJ:[id number] DOB: [birth year] DOA: 05/30/2017 PCP: Laurann Montana, MD     Brief Narrative:  Crystal Yoder is a 71 yo female with past medical hx NICM EF 45%, HTN, COPD, GERD, OSA, and morbid obesity who presented to the ED with 1 day history of diffuse abdominal pain. This was essentially constant pain, severe, "excruciating" in intensity, hard to localize (but she mostly points to the epigastrium), and associated with bloating. She had a small bowel movement that did not relieve the pain, finally decided she had diverticulitis like her father, and came to the ER. Work up in the ED revealed cholelithiasis with mild pericholecystic free fluid, and dilatation of the common bile duct to 1.2 cm in diameter, raising concern for distal obstruction, gallbladder wall thickening, with cholelithiasis and sludge in the gallbladder. General surgery was consulted and she underwent lap chole on 8/6.   Assessment & Plan:   Principal Problem:   Acute cholecystitis Active Problems:   Obstructive sleep apnea   Cardiomyopathy (HCC)   Essential hypertension   Acute calculous cholecystitis  Acute cholecystitis -MRCP without evidence of choledocholithiasis  -General surgery consulted, s/p laparoscopic cholecystectomy 8/6 with Dr. Andrey Campanile -Advance diet per [facility]CS  E Coli UTI POA -Urine culture with > 100,000 E Coli, sensitive to cipro -Continue cipro   HTN -Cozaar, coreg   NICM -EF 45%, grade 1 diastolic dysfunction. Follows with Dr. Tenny Craw, cardiology  -Coreg  GERD -PPI   Nodule on Chest CT -Follow up with PCP in 1 year  OSA -Failed CPAP   Chronic cough secondary to COPD, allergic rhinitis, GERD -Follows with Dr. Maple Hudson, pulmonology    DVT prophylaxis: lovenox Code Status: full Family Communication: husband at bedside  Disposition Plan: pending post-op course, discharge home when cleared by [facility]    Consultants:   CCS  Procedures:    None  Antimicrobials:  Anti-infectives    Start     Dose/Rate Route Frequency Ordered Stop   06/02/17 0900  ciprofloxacin (CIPRO) IVPB 400 mg     400 mg 200 mL/hr over 60 Minutes Intravenous Every 12 hours 06/02/17 0746     06/01/17 0800  cefTRIAXone (ROCEPHIN) 1 g in dextrose 5 % 50 mL IVPB  Status:  Discontinued     1 g 100 mL/hr over 30 Minutes Intravenous Every 24 hours 05/31/17 1207 06/01/17 1758   05/31/17 0630  cefTRIAXone (ROCEPHIN) 2 g in dextrose 5 % 50 mL IVPB  Status:  Discontinued     2 g 100 mL/hr over 30 Minutes Intravenous Every 24 hours 05/31/17 0602 05/31/17 1207   05/31/17 0100  ciprofloxacin (CIPRO) IVPB 400 mg     400 mg 200 mL/hr over 60 Minutes Intravenous  Once 05/31/17 0053 05/31/17 0323       Subjective: Eating full liquid diet this morning. Some abdominal soreness, no nausea, vomiting. No BM yet   Objective: Vitals:   06/01/17 2030 06/02/17 0230 06/02/17 0638 06/02/17 1000  BP: (!) 146/65 (!) 126/53 (!) 133/55 106/72  Pulse: 74 83 76 75  Resp: 20  16 16   Temp: 99.2 F (37.3 C) 98.4 F (36.9 C) 97.9 F (36.6 C) 98 F (36.7 C)  TempSrc: Oral Oral Axillary   SpO2: 96% 97% 92%   Weight:      Height:        Intake/Output Summary (Last 24 hours) at 06/02/17 1248 Last data filed at 06/02/17 1000  Gross per 24 hour  Intake             4140 ml  Output              150 ml  Net             3990 ml   Filed Weights   05/30/17 2002 06/01/17 1058  Weight: 120.2 kg (265 lb) 120.2 kg (265 lb)    Examination:  General exam: Appears calm and comfortable  Respiratory system: Clear to auscultation. Respiratory effort normal. Cardiovascular system: S1 & S2 heard, RRR. No JVD, murmurs, rubs, gallops or clicks. No pedal edema. Gastrointestinal system: Abdomen is nondistended, soft, lap incision sites with dressing, epigastric dressing is soaked with serosanguinous fluid, No organomegaly or masses felt. Normal bowel sounds heard. Central nervous system:  Alert and oriented. No focal neurological deficits. Extremities: Symmetric  Skin: No rashes, lesions or ulcers Psychiatry: Judgement and insight appear normal. Mood & affect appropriate.   Data Reviewed: I have personally reviewed following labs and imaging studies  CBC:  Recent Labs Lab 05/30/17 2145 06/01/17 0450 06/02/17 0605  WBC 12.4* 19.7* 18.2*  NEUTROABS  --   --  14.7*  HGB 13.2 13.0 11.5*  HCT 41.8 40.4 37.8  MCV 93.3 92.7 96.4  PLT 211 177 169   Basic Metabolic Panel:  Recent Labs Lab 05/30/17 2145 06/01/17 0450 06/02/17 0540  NA 141 141 140  K 4.6 4.1 4.7  CL 108 108 106  CO2 24 26 27   GLUCOSE 141* 113* 110*  BUN 18 11 16   CREATININE 0.77 0.66 0.82  CALCIUM 8.9 8.6* 8.3*   GFR: Estimated Creatinine Clearance: 87.2 mL/min (by C-G formula based on SCr of 0.82 mg/dL). Liver Function Tests:  Recent Labs Lab 05/30/17 2145 06/01/17 0450  AST 17 18  ALT 14 14  ALKPHOS 65 47  BILITOT 0.4 0.7  PROT 7.8 6.5  ALBUMIN 4.1 3.2*    Recent Labs Lab 05/30/17 2145  LIPASE 24   No results for input(s): AMMONIA in the last 168 hours. Coagulation Profile: No results for input(s): INR, PROTIME in the last 168 hours. Cardiac Enzymes: No results for input(s): CKTOTAL, CKMB, CKMBINDEX, TROPONINI in the last 168 hours. BNP (last 3 results) No results for input(s): PROBNP in the last 8760 hours. HbA1C: No results for input(s): HGBA1C in the last 72 hours. CBG: No results for input(s): GLUCAP in the last 168 hours. Lipid Profile: No results for input(s): CHOL, HDL, LDLCALC, TRIG, CHOLHDL, LDLDIRECT in the last 72 hours. Thyroid Function Tests: No results for input(s): TSH, T4TOTAL, FREET4, T3FREE, THYROIDAB in the last 72 hours. Anemia Panel: No results for input(s): VITAMINB12, FOLATE, FERRITIN, TIBC, IRON, RETICCTPCT in the last 72 hours. Sepsis Labs: No results for input(s): PROCALCITON, LATICACIDVEN in the last 168 hours.  Recent Results (from the  past 240 hour(s))  Urine culture     Status: Abnormal   Collection Time: 05/31/17 12:53 AM  Result Value Ref Range Status   Specimen Description URINE, CLEAN CATCH  Final   Special Requests NONE  Final   Culture >=100,000 COLONIES/mL ESCHERICHIA COLI (A)  Final   Report Status 06/02/2017 FINAL  Final   Organism ID, Bacteria ESCHERICHIA COLI (A)  Final      Susceptibility   Escherichia coli - MIC*    AMPICILLIN >=32 RESISTANT Resistant     CEFAZOLIN <=4 SENSITIVE Sensitive     CEFTRIAXONE <=1 SENSITIVE Sensitive     CIPROFLOXACIN <=0.25 SENSITIVE Sensitive  GENTAMICIN >=16 RESISTANT Resistant     IMIPENEM <=0.25 SENSITIVE Sensitive     NITROFURANTOIN <=16 SENSITIVE Sensitive     TRIMETH/SULFA <=20 SENSITIVE Sensitive     AMPICILLIN/SULBACTAM >=32 RESISTANT Resistant     PIP/TAZO <=4 SENSITIVE Sensitive     Extended ESBL NEGATIVE Sensitive     * >=100,000 COLONIES/mL ESCHERICHIA COLI       Radiology Studies: Mr 3d Recon At Scanner  Result Date: 06/01/2017 CLINICAL DATA:  Cholelithiasis.  Common duct dilatation. EXAM: MRI ABDOMEN WITHOUT AND WITH CONTRAST (INCLUDING MRCP) TECHNIQUE: Multiplanar multisequence MR imaging of the abdomen was performed both before and after the administration of intravenous contrast. Heavily T2-weighted images of the biliary and pancreatic ducts were obtained, and three-dimensional MRCP images were rendered by post processing. CONTRAST:  20 cc of MultiHance. COMPARISON:  CT ultrasound of earlier in the day. FINDINGS: Mild to moderate motion degradation involves the precontrast imaging. The dynamic images are of relatively good quality. Lower chest: Mild cardiomegaly, without pericardial or pleural effusion. Hepatobiliary: No focal liver lesion. Borderline to mild intrahepatic biliary duct dilatation. The common duct is upper normal in size, including at 9 mm on image 61/series 4. No evidence of choledocholithiasis. Multiple gallstones within a distended  gallbladder. Moderate gallbladder wall thickening and pericholecystic interstitial infiltration. Pancreas:  Normal, without mass or ductal dilatation. Spleen: 3.2 cm mildly septated cystic lesion within the subcapsular medial spleen, including on image 30/ series 16109. Adrenals/Urinary Tract: Normal adrenal glands. Normal left kidney. Right renal cysts. No hydronephrosis. Stomach/Bowel: Normal stomach and abdominal bowel loops. Vascular/Lymphatic: Normal caliber of the aorta and branch vessels. No retroperitoneal or retrocrural adenopathy. Other:  No ascites. Musculoskeletal: No acute osseous abnormality. IMPRESSION: 1. Motion degraded exam. 2. Cholelithiasis with gallbladder wall thickening and pericholecystic interstitial thickening. Given absence of sonographic Murphy sign on ultrasound, findings are suspicious for chronic cholecystitis. 3. Borderline biliary duct dilatation, without evidence of choledocholithiasis. 4. 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 Presuming no left upper quadrant symptoms or fever, of doubtful clinical significance. Electronically Signed   By: Jeronimo Greaves M.D.   On: 06/01/2017 07:51   Mr Abdomen Mrcp Vivien Rossetti Contast  Result Date: 06/01/2017 CLINICAL DATA:  Cholelithiasis.  Common duct dilatation. EXAM: MRI ABDOMEN WITHOUT AND WITH CONTRAST (INCLUDING MRCP) TECHNIQUE: Multiplanar multisequence MR imaging of the abdomen was performed both before and after the administration of intravenous contrast. Heavily T2-weighted images of the biliary and pancreatic ducts were obtained, and three-dimensional MRCP images were rendered by post processing. CONTRAST:  20 cc of MultiHance. COMPARISON:  CT ultrasound of earlier in the day. FINDINGS: Mild to moderate motion degradation involves the precontrast imaging. The dynamic images are of relatively good quality. Lower chest: Mild cardiomegaly, without pericardial or pleural effusion. Hepatobiliary: No focal liver lesion. Borderline  to mild intrahepatic biliary duct dilatation. The common duct is upper normal in size, including at 9 mm on image 61/series 4. No evidence of choledocholithiasis. Multiple gallstones within a distended gallbladder. Moderate gallbladder wall thickening and pericholecystic interstitial infiltration. Pancreas:  Normal, without mass or ductal dilatation. Spleen: 3.2 cm mildly septated cystic lesion within the subcapsular medial spleen, including on image 30/ series 60454. Adrenals/Urinary Tract: Normal adrenal glands. Normal left kidney. Right renal cysts. No hydronephrosis. Stomach/Bowel: Normal stomach and abdominal bowel loops. Vascular/Lymphatic: Normal caliber of the aorta and branch vessels. No retroperitoneal or retrocrural adenopathy. Other:  No ascites. Musculoskeletal: No acute osseous abnormality. IMPRESSION: 1. Motion degraded exam. 2. Cholelithiasis with gallbladder  wall thickening and pericholecystic interstitial thickening. Given absence of sonographic Murphy sign on ultrasound, findings are suspicious for chronic cholecystitis. 3. Borderline biliary duct dilatation, without evidence of choledocholithiasis. 4. 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 Presuming no left upper quadrant symptoms or fever, of doubtful clinical significance. Electronically Signed   By: Jeronimo Greaves M.D.   On: 06/01/2017 07:51      Scheduled Meds: . acetaminophen  1,000 mg Oral Q6H  . carvedilol  3.125 mg Oral BID WC  . docusate sodium  100 mg Oral BID  . enoxaparin (LOVENOX) injection  60 mg Subcutaneous Q24H  . losartan  50 mg Oral Daily  . pantoprazole (PROTONIX) IV  40 mg Intravenous Daily   Continuous Infusions: . sodium chloride 1,000 mL (06/01/17 2249)  . ciprofloxacin 400 mg (06/02/17 0956)     LOS: 1 day    Time spent: 30 minutes   Noralee Stain, DO [facility] [email or web address] Password [email or web address] 06/02/2017, 12:48 PM

## 2017-06-02 NOTE — Progress Notes (Signed)
[facility] Progress Note  1 Day Post-Op  Subjective: CC:  Tolerated clears last night, awaiting breakfast. Reports abdominal soreness worse with movement/deep breathing. Denies nausea/vomiting. Denies flatus/BM. Incentive spirometer is not at bedside.   Objective: Vital signs in last 24 hours: Temp:  [97.8 F (36.6 C)-99.2 F (37.3 C)] 97.9 F (36.6 C) (08/07 1610) Pulse Rate:  [36-88] 76 (08/07 0638) Resp:  [11-20] 16 (08/07 9604) BP: (108-155)/(53-95) 133/55 (08/07 0638) SpO2:  [90 %-97 %] 92 % (08/07 5409) Weight:  [120.2 kg (265 lb)] 120.2 kg (265 lb) (08/06 1058) Last BM Date: 05/31/17  Intake/Output from previous day: 08/06 0701 - 08/07 0700 In: 3632.5 [P.O.:340; I.V.:3292.5] Out: 450 [Urine:300; Blood:150] Intake/Output this shift: No intake/output data recorded.  PE: Gen:  Alert, NAD, pleasant Card:  Regular rate and rhythm Pulm:  Normal effort Abd: Soft, appropriately tender, mild distention, bowel sounds present, superior midline incision with dried sanguinous drainage on dressing, other incisions c/d/i. Skin: warm and dry, no rashes  Psych: A&Ox3   Lab Results:   Recent Labs  06/01/17 0450 06/02/17 0605  WBC 19.7* 18.2*  HGB 13.0 11.5*  HCT 40.4 37.8  PLT 177 169   BMET  Recent Labs  06/01/17 0450 06/02/17 0540  NA 141 140  K 4.1 4.7  CL 108 106  CO2 26 27  GLUCOSE 113* 110*  BUN 11 16  CREATININE 0.66 0.82  CALCIUM 8.6* 8.3*   PT/INR No results for input(s): LABPROT, INR in the last 72 hours. CMP     Component Value Date/Time   NA 140 06/02/2017 0540   K 4.7 06/02/2017 0540   CL 106 06/02/2017 0540   CO2 27 06/02/2017 0540   GLUCOSE 110 (H) 06/02/2017 0540   BUN 16 06/02/2017 0540   CREATININE 0.82 06/02/2017 0540   CALCIUM 8.3 (L) 06/02/2017 0540   PROT 6.5 06/01/2017 0450   ALBUMIN 3.2 (L) 06/01/2017 0450   AST 18 06/01/2017 0450   ALT 14 06/01/2017 0450   ALKPHOS 47 06/01/2017 0450   BILITOT 0.7 06/01/2017  0450   GFRNONAA >60 06/02/2017 0540   GFRAA >60 06/02/2017 0540   Lipase     Component Value Date/Time   LIPASE 24 05/30/2017 2145       Studies/Results: Mr 3d Recon At Scanner  Result Date: 06/01/2017 CLINICAL DATA:  Cholelithiasis.  Common duct dilatation. EXAM: MRI ABDOMEN WITHOUT AND WITH CONTRAST (INCLUDING MRCP) TECHNIQUE: Multiplanar multisequence MR imaging of the abdomen was performed both before and after the administration of intravenous contrast. Heavily T2-weighted images of the biliary and pancreatic ducts were obtained, and three-dimensional MRCP images were rendered by post processing. CONTRAST:  20 cc of MultiHance. COMPARISON:  CT ultrasound of earlier in the day. FINDINGS: Mild to moderate motion degradation involves the precontrast imaging. The dynamic images are of relatively good quality. Lower chest: Mild cardiomegaly, without pericardial or pleural effusion. Hepatobiliary: No focal liver lesion. Borderline to mild intrahepatic biliary duct dilatation. The common duct is upper normal in size, including at 9 mm on image 61/series 4. No evidence of choledocholithiasis. Multiple gallstones within a distended gallbladder. Moderate gallbladder wall thickening and pericholecystic interstitial infiltration. Pancreas:  Normal, without mass or ductal dilatation. Spleen: 3.2 cm mildly septated cystic lesion within the subcapsular medial spleen, including on image 30/ series 81191. Adrenals/Urinary Tract: Normal adrenal glands. Normal left kidney. Right renal cysts. No hydronephrosis. Stomach/Bowel: Normal stomach and abdominal bowel loops. Vascular/Lymphatic: Normal caliber of the aorta and branch vessels.  No retroperitoneal or retrocrural adenopathy. Other:  No ascites. Musculoskeletal: No acute osseous abnormality. IMPRESSION: 1. Motion degraded exam. 2. Cholelithiasis with gallbladder wall thickening and pericholecystic interstitial thickening. Given absence of sonographic Murphy sign  on ultrasound, findings are suspicious for chronic cholecystitis. 3. Borderline biliary duct dilatation, without evidence of choledocholithiasis. 4. 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 Presuming no left upper quadrant symptoms or fever, of doubtful clinical significance. Electronically Signed   By: Jeronimo Greaves M.D.   On: 06/01/2017 07:51   Mr Abdomen Mrcp Vivien Rossetti Contast  Result Date: 06/01/2017 CLINICAL DATA:  Cholelithiasis.  Common duct dilatation. EXAM: MRI ABDOMEN WITHOUT AND WITH CONTRAST (INCLUDING MRCP) TECHNIQUE: Multiplanar multisequence MR imaging of the abdomen was performed both before and after the administration of intravenous contrast. Heavily T2-weighted images of the biliary and pancreatic ducts were obtained, and three-dimensional MRCP images were rendered by post processing. CONTRAST:  20 cc of MultiHance. COMPARISON:  CT ultrasound of earlier in the day. FINDINGS: Mild to moderate motion degradation involves the precontrast imaging. The dynamic images are of relatively good quality. Lower chest: Mild cardiomegaly, without pericardial or pleural effusion. Hepatobiliary: No focal liver lesion. Borderline to mild intrahepatic biliary duct dilatation. The common duct is upper normal in size, including at 9 mm on image 61/series 4. No evidence of choledocholithiasis. Multiple gallstones within a distended gallbladder. Moderate gallbladder wall thickening and pericholecystic interstitial infiltration. Pancreas:  Normal, without mass or ductal dilatation. Spleen: 3.2 cm mildly septated cystic lesion within the subcapsular medial spleen, including on image 30/ series 04540. Adrenals/Urinary Tract: Normal adrenal glands. Normal left kidney. Right renal cysts. No hydronephrosis. Stomach/Bowel: Normal stomach and abdominal bowel loops. Vascular/Lymphatic: Normal caliber of the aorta and branch vessels. No retroperitoneal or retrocrural adenopathy. Other:  No ascites. Musculoskeletal: No  acute osseous abnormality. IMPRESSION: 1. Motion degraded exam. 2. Cholelithiasis with gallbladder wall thickening and pericholecystic interstitial thickening. Given absence of sonographic Murphy sign on ultrasound, findings are suspicious for chronic cholecystitis. 3. Borderline biliary duct dilatation, without evidence of choledocholithiasis. 4. 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 Presuming no left upper quadrant symptoms or fever, of doubtful clinical significance. Electronically Signed   By: Jeronimo Greaves M.D.   On: 06/01/2017 07:51    Anti-infectives: Anti-infectives    Start     Dose/Rate Route Frequency Ordered Stop   06/02/17 0900  ciprofloxacin (CIPRO) IVPB 400 mg     400 mg 200 mL/hr over 60 Minutes Intravenous Every 12 hours 06/02/17 0746     06/01/17 0800  cefTRIAXone (ROCEPHIN) 1 g in dextrose 5 % 50 mL IVPB  Status:  Discontinued     1 g 100 mL/hr over 30 Minutes Intravenous Every 24 hours 05/31/17 1207 06/01/17 1758   05/31/17 0630  cefTRIAXone (ROCEPHIN) 2 g in dextrose 5 % 50 mL IVPB  Status:  Discontinued     2 g 100 mL/hr over 30 Minutes Intravenous Every 24 hours 05/31/17 0602 05/31/17 1207   05/31/17 0100  ciprofloxacin (CIPRO) IVPB 400 mg     400 mg 200 mL/hr over 60 Minutes Intravenous  Once 05/31/17 0053 05/31/17 0323       Assessment/Plan 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 Gaynelle Adu  - intraoperatively found to have very large gallbladder requiring enlargement of subxiphoid incision for removal. Gallbladder was very adherent to liver and there was significant sanguinous oozing from liver bed, but hemostasis was achieved. - afebrile, VSS, WBC trending  down - pain control with scheduled tylenol, PRN oxycodone, PRN robaxin - advance diet as tolerated - mobilize and OOB - incentive spirometry  UTI - cipro  FEN - full liquids, advance to [facility] diet as tolerated VTE - hgb 11.5 today, may  resume lovenox ID - cipro 8/6>>, no PO abx at discharge from surgical perspective Dispo - anticipate discharge in 24 hours.     LOS: 1 day    Adam Phenix , [facility] 06/02/2017, 9:52 AM Pager: [phone] Consults: [phone] Mon-Fri 7:00 am-4:30 pm Sat-Sun 7:00 am-11:30 am

## 2017-06-02 NOTE — Care Management CC44 (Signed)
Condition Code 44 Documentation Completed  Patient Details  Name: Crystal Yoder MRN: [id number] Date of Birth: December 31, 1945   Condition Code 44 given:  Yes Patient signature on Condition Code 44 notice:  Yes Documentation of 2 MD's agreement:  Yes Code 44 added to claim:  Yes    Alexis Goodell, RN 06/02/2017, 9:40 AM

## 2017-06-03 ENCOUNTER — Observation Stay (HOSPITAL_COMMUNITY): Payer: Medicare Other

## 2017-06-03 DIAGNOSIS — I10 Essential (primary) hypertension: Secondary | ICD-10-CM

## 2017-06-03 DIAGNOSIS — K219 Gastro-esophageal reflux disease without esophagitis: Secondary | ICD-10-CM | POA: Diagnosis present

## 2017-06-03 DIAGNOSIS — Z6839 Body mass index (BMI) 39.0-39.9, adult: Secondary | ICD-10-CM | POA: Diagnosis not present

## 2017-06-03 DIAGNOSIS — R0902 Hypoxemia: Secondary | ICD-10-CM | POA: Diagnosis present

## 2017-06-03 DIAGNOSIS — K810 Acute cholecystitis: Secondary | ICD-10-CM

## 2017-06-03 DIAGNOSIS — I110 Hypertensive heart disease with heart failure: Secondary | ICD-10-CM | POA: Diagnosis present

## 2017-06-03 DIAGNOSIS — B9620 Unspecified Escherichia coli [E. coli] as the cause of diseases classified elsewhere: Secondary | ICD-10-CM | POA: Diagnosis present

## 2017-06-03 DIAGNOSIS — Z87891 Personal history of nicotine dependence: Secondary | ICD-10-CM | POA: Diagnosis not present

## 2017-06-03 DIAGNOSIS — I428 Other cardiomyopathies: Secondary | ICD-10-CM | POA: Diagnosis present

## 2017-06-03 DIAGNOSIS — J9811 Atelectasis: Secondary | ICD-10-CM | POA: Diagnosis not present

## 2017-06-03 DIAGNOSIS — N3000 Acute cystitis without hematuria: Secondary | ICD-10-CM

## 2017-06-03 DIAGNOSIS — K8012 Calculus of gallbladder with acute and chronic cholecystitis without obstruction: Secondary | ICD-10-CM | POA: Diagnosis present

## 2017-06-03 DIAGNOSIS — I5033 Acute on chronic diastolic (congestive) heart failure: Secondary | ICD-10-CM | POA: Diagnosis present

## 2017-06-03 DIAGNOSIS — Z9081 Acquired absence of spleen: Secondary | ICD-10-CM | POA: Diagnosis not present

## 2017-06-03 DIAGNOSIS — Z882 Allergy status to sulfonamides status: Secondary | ICD-10-CM | POA: Diagnosis not present

## 2017-06-03 DIAGNOSIS — Z7951 Long term (current) use of inhaled steroids: Secondary | ICD-10-CM | POA: Diagnosis not present

## 2017-06-03 DIAGNOSIS — G4733 Obstructive sleep apnea (adult) (pediatric): Secondary | ICD-10-CM | POA: Diagnosis present

## 2017-06-03 DIAGNOSIS — J449 Chronic obstructive pulmonary disease, unspecified: Secondary | ICD-10-CM | POA: Diagnosis present

## 2017-06-03 DIAGNOSIS — Z96653 Presence of artificial knee joint, bilateral: Secondary | ICD-10-CM | POA: Diagnosis present

## 2017-06-03 DIAGNOSIS — I872 Venous insufficiency (chronic) (peripheral): Secondary | ICD-10-CM | POA: Diagnosis present

## 2017-06-03 DIAGNOSIS — J309 Allergic rhinitis, unspecified: Secondary | ICD-10-CM | POA: Diagnosis present

## 2017-06-03 DIAGNOSIS — I429 Cardiomyopathy, unspecified: Secondary | ICD-10-CM | POA: Diagnosis present

## 2017-06-03 DIAGNOSIS — K8000 Calculus of gallbladder with acute cholecystitis without obstruction: Secondary | ICD-10-CM | POA: Diagnosis not present

## 2017-06-03 DIAGNOSIS — Z880 Allergy status to penicillin: Secondary | ICD-10-CM | POA: Diagnosis not present

## 2017-06-03 LAB — BASIC METABOLIC PANEL
ANION GAP: 7 (ref 5–15)
BUN: 21 mg/dL — AB (ref 6–20)
CHLORIDE: 108 mmol/L (ref 101–111)
CO2: 26 mmol/L (ref 22–32)
Calcium: 8.6 mg/dL — ABNORMAL LOW (ref 8.9–10.3)
Creatinine, Ser: 1.08 mg/dL — ABNORMAL HIGH (ref 0.44–1.00)
GFR calc Af Amer: 58 mL/min — ABNORMAL LOW (ref >60)
GFR, EST NON AFRICAN AMERICAN: 50 mL/min — AB (ref >60)
Glucose, Bld: 100 mg/dL — ABNORMAL HIGH (ref 65–99)
POTASSIUM: 4.4 mmol/L (ref 3.5–5.1)
SODIUM: 141 mmol/L (ref 135–145)

## 2017-06-03 LAB — CBC WITH DIFFERENTIAL/PLATELET
BASOS ABS: 0 10*3/uL (ref 0.0–0.1)
Basophils Relative: 0 %
EOS ABS: 0.1 10*3/uL (ref 0.0–0.7)
EOS PCT: 1 %
HCT: 38.6 % (ref 36.0–46.0)
HEMOGLOBIN: 11.8 g/dL — AB (ref 12.0–15.0)
LYMPHS ABS: 1.1 10*3/uL (ref 0.7–4.0)
LYMPHS PCT: 7 %
MCH: 29.7 pg (ref 26.0–34.0)
MCHC: 30.6 g/dL (ref 30.0–36.0)
MCV: 97.2 fL (ref 78.0–100.0)
Monocytes Absolute: 1.8 10*3/uL — ABNORMAL HIGH (ref 0.1–1.0)
Monocytes Relative: 12 %
NEUTROS PCT: 80 %
Neutro Abs: 12.1 10*3/uL — ABNORMAL HIGH (ref 1.7–7.7)
PLATELETS: 168 10*3/uL (ref 150–400)
RBC: 3.97 MIL/uL (ref 3.87–5.11)
RDW: 14.8 % (ref 11.5–15.5)
WBC: 15.1 10*3/uL — AB (ref 4.0–10.5)

## 2017-06-03 MED ORDER — BOOST / RESOURCE BREEZE PO LIQD
1.0000 | Freq: Two times a day (BID) | ORAL | Status: DC
  Administered 2017-06-05: 1 via ORAL

## 2017-06-03 MED ORDER — ACETAMINOPHEN 325 MG PO TABS: 650.0000 mg | ORAL_TABLET | Freq: Four times a day (QID) | ORAL | Status: DC

## 2017-06-03 MED ORDER — OXYCODONE HCL 5 MG PO TABS
5.0000 mg | ORAL_TABLET | ORAL | Status: DC | PRN
  Administered 2017-06-03 – 2017-06-05 (×5): 5 mg via ORAL
  Filled 2017-06-03 (×6): qty 1

## 2017-06-03 MED ORDER — HYDROMORPHONE HCL-NACL 0.5-0.9 MG/ML-% IV SOSY: 0.5000 mg | PREFILLED_SYRINGE | INTRAVENOUS | Status: DC | PRN

## 2017-06-03 MED ORDER — ACETAMINOPHEN 325 MG PO TABS
650.0000 mg | ORAL_TABLET | Freq: Four times a day (QID) | ORAL | Status: DC | PRN
  Administered 2017-06-04 – 2017-06-06 (×6): 650 mg via ORAL
  Filled 2017-06-03 (×6): qty 2

## 2017-06-03 MED ORDER — FUROSEMIDE 10 MG/ML IJ SOLN
40.0000 mg | Freq: Once | INTRAMUSCULAR | Status: AC
  Administered 2017-06-03: 40 mg via INTRAVENOUS
  Filled 2017-06-03: qty 4

## 2017-06-03 NOTE — Progress Notes (Signed)
PT Cancellation Note  Patient Details Name: Crystal Yoder MRN: [id number] DOB: 03-May-1946   Cancelled Treatment:    Reason Eval/Treat Not Completed: Checked on pt. Pt working with OT. Pt and husband reported pt walked with nursing just prior to OT session-declined to work with PT at this time. Will check back another day.    Rebeca Alert, MPT Pager: [phone]
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
## 2017-06-03 NOTE — Progress Notes (Signed)
PROGRESS NOTE    BASEEMAH LONGCOR  ZTI:[id number] DOB: 11/21/1945 DOA: 05/30/2017 PCP: Laurann Montana, MD     Brief Narrative:  Crystal Yoder is a 71 yo female with past medical hx NICM EF 45%, HTN, COPD, GERD, OSA, and morbid obesity who presented to the ED with 1 day history of diffuse abdominal pain.  Work up in the ED revealed cholelithiasis with mild pericholecystic free fluid, and dilatation of the common bile duct to 1.2 cm in diameter, raising concern for distal obstruction, gallbladder wall thickening, with cholelithiasis and sludge in the gallbladder. General surgery was consulted and she underwent lap chole on 8/6. Slow to recover since then.  Assessment & Plan:   Acute cholecystitis -MRCP without evidence of choledocholithiasis  -General surgery consulted, s/p laparoscopic cholecystectomy 8/6 with Dr. Andrey Campanile -on full liquids, advance diet per [facility] -ambulate, decrease pain meds, incentive spirometry  Hypoxia -due to atelectasis and some volume overload -she is 9L positive, stop IVF-on NS for >3days now -IV lasix now, Incentive spirometry -ambulate, OOB  E Coli UTI POA -Urine culture with > 100,000 E Coli, sensitive to cipro -Continue cipro for 2 more days  HTN -hold Cozaar, continue coreg   NICM -EF 45%, grade 1 diastolic dysfunction. Follows with Dr. Tenny Craw, cardiology  -volume overloaded today, iatrogenic from IVF administration, stop IVF and IV lasix now  GERD -PPI   Nodule on Chest CT -Follow up with PCP in 1 year  OSA -Failed CPAP   Chronic cough secondary to COPD, allergic rhinitis, GERD -Follows with Dr. Maple Hudson, pulmonology   DVT prophylaxis: lovenox Code Status: full Family Communication: husband at bedside  Disposition Plan: pending improvement in post-op course, and home when cleared by [facility]    Consultants:   [facility]  Procedures:   None  Antimicrobials:  Anti-infectives    Start     Dose/Rate Route Frequency Ordered Stop   06/02/17 0900   ciprofloxacin (CIPRO) IVPB 400 mg     400 mg 200 mL/hr over 60 Minutes Intravenous Every 12 hours 06/02/17 0746     06/01/17 0800  cefTRIAXone (ROCEPHIN) 1 g in dextrose 5 % 50 mL IVPB  Status:  Discontinued     1 g 100 mL/hr over 30 Minutes Intravenous Every 24 hours 05/31/17 1207 06/01/17 1758   05/31/17 0630  cefTRIAXone (ROCEPHIN) 2 g in dextrose 5 % 50 mL IVPB  Status:  Discontinued     2 g 100 mL/hr over 30 Minutes Intravenous Every 24 hours 05/31/17 0602 05/31/17 1207   05/31/17 0100  ciprofloxacin (CIPRO) IVPB 400 mg     400 mg 200 mL/hr over 60 Minutes Intravenous  Once 05/31/17 0053 05/31/17 0323       Subjective: Some dyspnea, cough, no flatus or BM  Objective: Vitals:   06/02/17 1400 06/02/17 1800 06/02/17 1953 06/03/17 0527  BP: (!) 103/57 100/60 121/79 (!) 120/42  Pulse: 66 70 88 86  Resp: 18 19 18 16   Temp: 98.6 F (37 C) 98.7 F (37.1 C) 97.7 F (36.5 C) 98.7 F (37.1 C)  TempSrc: Oral Oral Oral Oral  SpO2: 97% 98% 93% 98%  Weight:      Height:        Intake/Output Summary (Last 24 hours) at 06/03/17 1305 Last data filed at 06/03/17 0600  Gross per 24 hour  Intake             1940 ml  Output  300 ml  Net             1640 ml   Filed Weights   05/30/17 2002 06/01/17 1058  Weight: 120.2 kg (265 lb) 120.2 kg (265 lb)    Examination:  General exam: obese, ill appearing female, laying in bed, no distress Respiratory system: poor air movement, decreased at bases Cardiovascular system: S1 & S2 heard, RRR. Gastrointestinal system: Abd is obese, soft, slightly distended, port sites with mild redness, BS diminished but present Central nervous system: somnolent but no focal neurological deficits. Extremities: trace edema Skin: No rashes, lesions or ulcers Psychiatry: Judgement and insight appear normal. Mood & affect appropriate.   Data Reviewed: I have personally reviewed following labs and imaging studies  CBC:  Recent Labs Lab  05/30/17 2145 06/01/17 0450 06/02/17 0605 06/03/17 0521  WBC 12.4* 19.7* 18.2* 15.1*  NEUTROABS  --   --  14.7* 12.1*  HGB 13.2 13.0 11.5* 11.8*  HCT 41.8 40.4 37.8 38.6  MCV 93.3 92.7 96.4 97.2  PLT 211 177 169 168   Basic Metabolic Panel:  Recent Labs Lab 05/30/17 2145 06/01/17 0450 06/02/17 0540 06/03/17 0521  NA 141 141 140 141  K 4.6 4.1 4.7 4.4  CL 108 108 106 108  CO2 24 26 27 26   GLUCOSE 141* 113* 110* 100*  BUN 18 11 16  21*  CREATININE 0.77 0.66 0.82 1.08*  CALCIUM 8.9 8.6* 8.3* 8.6*   GFR: Estimated Creatinine Clearance: 66.2 mL/min (A) (by C-G formula based on SCr of 1.08 mg/dL (H)). Liver Function Tests:  Recent Labs Lab 05/30/17 2145 06/01/17 0450  AST 17 18  ALT 14 14  ALKPHOS 65 47  BILITOT 0.4 0.7  PROT 7.8 6.5  ALBUMIN 4.1 3.2*    Recent Labs Lab 05/30/17 2145  LIPASE 24   No results for input(s): AMMONIA in the last 168 hours. Coagulation Profile: No results for input(s): INR, PROTIME in the last 168 hours. Cardiac Enzymes: No results for input(s): CKTOTAL, CKMB, CKMBINDEX, TROPONINI in the last 168 hours. BNP (last 3 results) No results for input(s): PROBNP in the last 8760 hours. HbA1C: No results for input(s): HGBA1C in the last 72 hours. CBG: No results for input(s): GLUCAP in the last 168 hours. Lipid Profile: No results for input(s): CHOL, HDL, LDLCALC, TRIG, CHOLHDL, LDLDIRECT in the last 72 hours. Thyroid Function Tests: No results for input(s): TSH, T4TOTAL, FREET4, T3FREE, THYROIDAB in the last 72 hours. Anemia Panel: No results for input(s): VITAMINB12, FOLATE, FERRITIN, TIBC, IRON, RETICCTPCT in the last 72 hours. Sepsis Labs: No results for input(s): PROCALCITON, LATICACIDVEN in the last 168 hours.  Recent Results (from the past 240 hour(s))  Urine culture     Status: Abnormal   Collection Time: 05/31/17 12:53 AM  Result Value Ref Range Status   Specimen Description URINE, CLEAN CATCH  Final   Special Requests  NONE  Final   Culture >=100,000 COLONIES/mL ESCHERICHIA COLI (A)  Final   Report Status 06/02/2017 FINAL  Final   Organism ID, Bacteria ESCHERICHIA COLI (A)  Final      Susceptibility   Escherichia coli - MIC*    AMPICILLIN >=32 RESISTANT Resistant     CEFAZOLIN <=4 SENSITIVE Sensitive     CEFTRIAXONE <=1 SENSITIVE Sensitive     CIPROFLOXACIN <=0.25 SENSITIVE Sensitive     GENTAMICIN >=16 RESISTANT Resistant     IMIPENEM <=0.25 SENSITIVE Sensitive     NITROFURANTOIN <=16 SENSITIVE Sensitive     TRIMETH/SULFA <=20  SENSITIVE Sensitive     AMPICILLIN/SULBACTAM >=32 RESISTANT Resistant     PIP/TAZO <=4 SENSITIVE Sensitive     Extended ESBL NEGATIVE Sensitive     * >=100,000 COLONIES/mL ESCHERICHIA COLI       Radiology Studies: Dg Chest 2 View  Result Date: 06/03/2017 CLINICAL DATA:  Hypoxia EXAM: CHEST - 2 VIEW COMPARISON:  12/01/2016 FINDINGS: Lower lung volumes with new bibasilar patchy atelectasis or infiltrate left greater than right. The posterior aspect of the right diaphragmatic leaflet is obscured and can't entirely exclude small effusion. Heart size upper limits normal for technique. Tortuous thoracic aorta. No pneumothorax. Visualized bones unremarkable. Surgical clips in the upper abdomen. IMPRESSION: 1. Low volumes with new bibasilar atelectasis or infiltrates. Electronically Signed   By: Corlis Leak M.D.   On: 06/03/2017 10:03      Scheduled Meds: . acetaminophen  1,000 mg Oral Q6H  . carvedilol  3.125 mg Oral BID WC  . docusate sodium  100 mg Oral BID  . enoxaparin (LOVENOX) injection  60 mg Subcutaneous Q24H  . losartan  50 mg Oral Daily  . pantoprazole (PROTONIX) IV  40 mg Intravenous Daily   Continuous Infusions: . ciprofloxacin Stopped (06/03/17 1000)     LOS: 1 day    Time spent: 30 minutes   Zannie Cove, MD [facility] Page via [email or web address] Password [email or web address] 06/03/2017, 1:05 PM

## 2017-06-03 NOTE — Discharge Instructions (Signed)
Laparoscopic Cholecystectomy, Care After °This sheet gives you information about how to care for yourself after your procedure. Your health care provider may also give you more specific instructions. If you have problems or questions, contact your health care provider. °What can I expect after the procedure? °After the procedure, it is common to have: °· Pain at your incision sites. You will be given medicines to control this pain. °· Mild nausea or vomiting. °· Bloating and possible shoulder pain from the air-like gas that was used during the procedure. °Follow these instructions at home: °Incision care  ° °· Follow instructions from your health care provider about how to take care of your incisions. Make sure you: °¨ Wash your hands with soap and water before you change your bandage (dressing). If soap and water are not available, use hand sanitizer. °¨ Change your dressing as told by your health care provider. °¨ Leave stitches (sutures), skin glue, or adhesive strips in place. These skin closures may need to be in place for 2 weeks or longer. If adhesive strip edges start to loosen and curl up, you may trim the loose edges. Do not remove adhesive strips completely unless your health care provider tells you to do that. °· Do not take baths, swim, or use a hot tub until your health care provider approves. Ask your health care provider if you can take showers. You may only be allowed to take sponge baths for bathing. °· Check your incision area every day for signs of infection. Check for: °¨ More redness, swelling, or pain. °¨ More fluid or blood. °¨ Warmth. °¨ Pus or a bad smell. °Activity  °· Do not drive or use heavy machinery while taking prescription pain medicine. °· Do not lift anything that is heavier than 10 lb (4.5 kg) until your health care provider approves. °· Do not play contact sports until your health care provider approves. °· Do not drive for 24 hours if you were given a medicine to help you relax  (sedative). °· Rest as needed. Do not return to work or school until your health care provider approves. °General instructions  °· Take over-the-counter and prescription medicines only as told by your health care provider. °· To prevent or treat constipation while you are taking prescription pain medicine, your health care provider may recommend that you: °¨ Drink enough fluid to keep your urine clear or pale yellow. °¨ Take over-the-counter or prescription medicines. °¨ Eat foods that are high in fiber, such as fresh fruits and vegetables, whole grains, and beans. °¨ Limit foods that are high in fat and processed sugars, such as fried and sweet foods. °Contact a health care provider if: °· You develop a rash. °· You have more redness, swelling, or pain around your incisions. °· You have more fluid or blood coming from your incisions. °· Your incisions feel warm to the touch. °· You have pus or a bad smell coming from your incisions. °· You have a fever. °· One or more of your incisions breaks open. °Get help right away if: °· You have trouble breathing. °· You have chest pain. °· You have increasing pain in your shoulders. °· You faint or feel dizzy when you stand. °· You have severe pain in your abdomen. °· You have nausea or vomiting that lasts for more than one day. °· You have leg pain. °This information is not intended to replace advice given to you by your health care provider. Make sure you discuss any   questions you have with your health care provider. °Document Released: 10/13/2005 Document Revised: 05/03/2016 Document Reviewed: 03/31/2016 °Elsevier Interactive Patient Education © 2017 [employer] ° °CCS ______[facility] °LAPAROSCOPIC SURGERY: POST OP INSTRUCTIONS °Always review your discharge instruction sheet given to you by the facility where your surgery was performed. °IF YOU HAVE DISABILITY OR FAMILY LEAVE FORMS, YOU MUST BRING THEM TO THE OFFICE FOR PROCESSING.   °DO NOT GIVE  THEM TO YOUR DOCTOR. ° °1. A prescription for pain medication may be given to you upon discharge.  Take your pain medication as prescribed, if needed.  If narcotic pain medicine is not needed, then you may take acetaminophen (Tylenol) or ibuprofen (Advil) as needed. °2. Take your usually prescribed medications unless otherwise directed. °3. If you need a refill on your pain medication, please contact your pharmacy.  They will contact our office to request authorization. Prescriptions will not be filled after 5pm or on week-ends. °4. You should follow a light diet the first few days after arrival home, such as soup and crackers, etc.  Be sure to include lots of fluids daily. °5. Most patients will experience some swelling and bruising in the area of the incisions.  Ice packs will help.  Swelling and bruising can take several days to resolve.  °6. It is common to experience some constipation if taking pain medication after surgery.  Increasing fluid intake and taking a stool softener (such as Colace) will usually help or prevent this problem from occurring.  A mild laxative (Milk of Magnesia or Miralax) should be taken according to package instructions if there are no bowel movements after 48 hours. °7. Unless discharge instructions indicate otherwise, you may remove your bandages 24-48 hours after surgery, and you may shower at that time.  You may have steri-strips (small skin tapes) in place directly over the incision.  These strips should be left on the skin for 7-10 days.  If your surgeon used skin glue on the incision, you may shower in 24 hours.  The glue will flake off over the next 2-3 weeks.  Any sutures or staples will be removed at the office during your follow-up visit. °8. ACTIVITIES:  You may resume regular (light) daily activities beginning the next day--such as daily self-care, walking, climbing stairs--gradually increasing activities as tolerated.  You may have sexual intercourse when it is  comfortable.  Refrain from any heavy lifting or straining until approved by your doctor. °a. You may drive when you are no longer taking prescription pain medication, you can comfortably wear a seatbelt, and you can safely maneuver your car and apply brakes. °b. RETURN TO WORK:  __________________________________________________________ °9. You should see your doctor in the office for a follow-up appointment approximately 2-3 weeks after your surgery.  Make sure that you call for this appointment within a day or two after you arrive home to insure a convenient appointment time. °10. OTHER INSTRUCTIONS: __________________________________________________________________________________________________________________________ __________________________________________________________________________________________________________________________ °WHEN TO CALL YOUR DOCTOR: °1. Fever over 101.0 °2. Inability to urinate °3. Continued bleeding from incision. °4. Increased pain, redness, or drainage from the incision. °5. Increasing abdominal pain ° °The clinic staff is available to answer your questions during regular business hours.  Please don’t hesitate to call and ask to speak to one of the nurses for clinical concerns.  If you have a medical emergency, go to the nearest emergency room or call 911.  A surgeon from [facility] is always on call at the hospital. °[address], [address], [place], [place]  [address] ? [address] [address], [place], [place]   [address] °[phone] ? [phone] ? FAX [phone] °Web site: [email or web address] ° °

## 2017-06-04 DIAGNOSIS — I429 Cardiomyopathy, unspecified: Secondary | ICD-10-CM

## 2017-06-04 DIAGNOSIS — K8000 Calculus of gallbladder with acute cholecystitis without obstruction: Secondary | ICD-10-CM

## 2017-06-04 LAB — CBC WITH DIFFERENTIAL/PLATELET
BASOS PCT: 0 %
Basophils Absolute: 0 10*3/uL (ref 0.0–0.1)
EOS ABS: 0.3 10*3/uL (ref 0.0–0.7)
Eosinophils Relative: 3 %
HCT: 35.5 % — ABNORMAL LOW (ref 36.0–46.0)
HEMOGLOBIN: 10.9 g/dL — AB (ref 12.0–15.0)
Lymphocytes Relative: 12 %
Lymphs Abs: 1.2 10*3/uL (ref 0.7–4.0)
MCH: 29.4 pg (ref 26.0–34.0)
MCHC: 30.7 g/dL (ref 30.0–36.0)
MCV: 95.7 fL (ref 78.0–100.0)
MONOS PCT: 13 %
Monocytes Absolute: 1.4 10*3/uL — ABNORMAL HIGH (ref 0.1–1.0)
NEUTROS PCT: 72 %
Neutro Abs: 7.5 10*3/uL (ref 1.7–7.7)
Platelets: 191 10*3/uL (ref 150–400)
RBC: 3.71 MIL/uL — AB (ref 3.87–5.11)
RDW: 14.7 % (ref 11.5–15.5)
WBC: 10.3 10*3/uL (ref 4.0–10.5)

## 2017-06-04 LAB — BASIC METABOLIC PANEL
ANION GAP: 8 (ref 5–15)
BUN: 18 mg/dL (ref 6–20)
CHLORIDE: 104 mmol/L (ref 101–111)
CO2: 28 mmol/L (ref 22–32)
CREATININE: 0.83 mg/dL (ref 0.44–1.00)
Calcium: 8.8 mg/dL — ABNORMAL LOW (ref 8.9–10.3)
GFR calc non Af Amer: >60 mL/min (ref >60)
Glucose, Bld: 105 mg/dL — ABNORMAL HIGH (ref 65–99)
Potassium: 4 mmol/L (ref 3.5–5.1)
SODIUM: 140 mmol/L (ref 135–145)

## 2017-06-04 MED ORDER — POTASSIUM CHLORIDE CRYS ER 20 MEQ PO TBCR
20.0000 meq | EXTENDED_RELEASE_TABLET | Freq: Once | ORAL | Status: AC
  Administered 2017-06-04: 20 meq via ORAL
  Filled 2017-06-04: qty 1

## 2017-06-04 MED ORDER — CIPROFLOXACIN HCL 500 MG PO TABS
250.0000 mg | ORAL_TABLET | Freq: Two times a day (BID) | ORAL | Status: DC
  Administered 2017-06-04 – 2017-06-05 (×3): 250 mg via ORAL
  Filled 2017-06-04 (×2): qty 1

## 2017-06-04 MED ORDER — PANTOPRAZOLE SODIUM 40 MG PO TBEC
40.0000 mg | DELAYED_RELEASE_TABLET | Freq: Every day | ORAL | Status: DC
  Administered 2017-06-04 – 2017-06-06 (×3): 40 mg via ORAL
  Filled 2017-06-04 (×3): qty 1

## 2017-06-04 MED ORDER — FUROSEMIDE 10 MG/ML IJ SOLN
40.0000 mg | Freq: Once | INTRAMUSCULAR | Status: AC
  Administered 2017-06-04: 40 mg via INTRAVENOUS
  Filled 2017-06-04: qty 4

## 2017-06-04 NOTE — Progress Notes (Signed)
Physical Therapy Treatment Patient Details Name: Crystal Yoder MRN: [id number] DOB: 01-07-1946 Today's Date: 06/04/2017   SATURATION QUALIFICATIONS: (This note is used to comply with regulatory documentation for home oxygen)   Patient Saturations on Room Air while Ambulating = 87%  Patient Saturations on 1 Liters of oxygen while Ambulating = 91%     History of Present Illness 71 yo female s/p lap cholectystectomy 06/01/17. Hx of HTN, COPD, obesity    PT Comments    Progressing with mobility. Pt still rates pain as moderate with activity. Recommend ambulation with nursing supervision.    Follow Up Recommendations  Home health PT;Supervision/Assistance - 24 hour     Equipment Recommendations  None recommended by PT    Recommendations for Other Services       Precautions / Restrictions Precautions Precautions: Fall Precaution Comments: abd surgery. monitor O2 Restrictions Weight Bearing Restrictions: No    Mobility  Bed Mobility               General bed mobility comments: oob in recliner  Transfers Overall transfer level: Needs assistance Equipment used: Rolling walker (2 wheeled) Transfers: Sit to/from Stand Sit to Stand: Min guard         General transfer comment: close guard for safety. some cueing required as well.   Ambulation/Gait Ambulation/Gait assistance: Min guard Ambulation Distance (Feet): 110 Feet (15'x1, 110'x1) Assistive device: Rolling walker (2 wheeled) Gait Pattern/deviations: Step-through pattern;Decreased stride length;Decreased step length - right     General Gait Details: close guard for safety. slow gait speed. O2 sat 87% on RA, 91% on 1L [place] O2.    Stairs            Wheelchair Mobility    Modified Rankin (Stroke Patients Only)       Balance Overall balance assessment: Needs assistance         Standing balance support: Bilateral upper extremity supported Standing balance-Leahy Scale: Poor                               Cognition Arousal/Alertness: Awake/alert Behavior During Therapy: WFL for tasks assessed/performed Overall Cognitive Status: Within Functional Limits for tasks assessed                                        Exercises      General Comments        Pertinent Vitals/Pain Pain Assessment: 0-10 Pain Score: 8  Pain Location: abdomen Pain Descriptors / Indicators: Sore;Tender Pain Intervention(s): Monitored during session    Home Living                      Prior Function            PT Goals (current goals can now be found in the care plan section) Progress towards PT goals: Progressing toward goals    Frequency    Min 3X/week      PT Plan Current plan remains appropriate    Co-evaluation              AM-PAC PT "6 Clicks" Daily Activity  Outcome Measure  Difficulty turning over in bed (including adjusting bedclothes, sheets and blankets)?: A Lot Difficulty moving from lying on back to sitting on the side of the bed? : A Lot Difficulty sitting down on and standing  up from a chair with arms (e.g., wheelchair, bedside commode, etc,.)?: A Lot Help needed moving to and from a bed to chair (including a wheelchair)?: A Little Help needed walking in hospital room?: A Little Help needed climbing 3-5 steps with a railing? : A Lot 6 Click Score: 14    End of Session Equipment Utilized During Treatment: Oxygen Activity Tolerance: Patient limited by pain Patient left: in chair;with call bell/phone within reach   PT Visit Diagnosis: Muscle weakness (generalized) (M62.81);Difficulty in walking, not elsewhere classified (R26.2)     Time: 1022-1050 PT Time Calculation (min) (ACUTE ONLY): 28 min  Charges:  $Gait Training: 23-37 mins                    G Codes:         Rebeca Alert, MPT Pager: [phone]

## 2017-06-04 NOTE — Progress Notes (Signed)
PROGRESS NOTE    Crystal Yoder  OPF:[id number] DOB: 07/29/1946 DOA: 05/30/2017 PCP: Laurann Montana, MD     Brief Narrative:  Crystal Yoder is a 71 yo female with past medical hx NICM EF 45%, HTN, COPD, GERD, OSA, and morbid obesity who presented to the ED with 1 day history of diffuse abdominal pain.  Work up in the ED revealed cholelithiasis with mild pericholecystic free fluid, and dilatation of the common bile duct to 1.2 cm in diameter, raising concern for distal obstruction, gallbladder wall thickening, with cholelithiasis and sludge in the gallbladder. General surgery was consulted and she underwent lap chole on 8/6. Slow to recover since then.  Assessment & Plan:   Acute cholecystitis -MRCP without evidence of choledocholithiasis  -General surgery consulted, s/p laparoscopic cholecystectomy 8/6 with Dr. Andrey Campanile -Advance diet, increase ambulation -Encouraged incentive spirometry and cut down pain medications  Hypoxia -due to atelectasis and some volume overload -she is 9L positive, stopped IVF -Improved with IV Lasix and incentive spirometer -We'll give another dose of Lasix today with potassium -Out of bed to chair and ambulate halls with assistance  E Coli UTI POA -Urine culture with > 100,000 E Coli, sensitive to cipro -Continue ciprofloxacin for 1 more day, change to by mouth   HTN -hold Cozaar, continue coreg  -BP improved with diuresis  NICM -EF 45%, grade 1 diastolic dysfunction. Follows with Dr. Tenny Craw, cardiology  -Volume status much improved, another dose of IV Lasix today  GERD -PPI changed to by mouth  Nodule on Chest CT -Follow up with PCP in 1 year  OSA -Failed CPAP   Chronic cough secondary to COPD, allergic rhinitis, GERD -Follows with Dr. Maple Hudson, pulmonology   DVT prophylaxis: lovenox Code Status: full Family Communication: husband at bedside  Disposition Plan: home tomorrow    Consultants:   CCS  Procedures: Laparoscopic cholecystectomy  8/6  Antimicrobials:  Anti-infectives    Start     Dose/Rate Route Frequency Ordered Stop   06/04/17 1100  ciprofloxacin (CIPRO) tablet 250 mg     250 mg Oral 2 times daily 06/04/17 1036 06/06/17 0759   06/02/17 0900  ciprofloxacin (CIPRO) IVPB 400 mg  Status:  Discontinued     400 mg 200 mL/hr over 60 Minutes Intravenous Every 12 hours 06/02/17 0746 06/04/17 1036   06/01/17 0800  cefTRIAXone (ROCEPHIN) 1 g in dextrose 5 % 50 mL IVPB  Status:  Discontinued     1 g 100 mL/hr over 30 Minutes Intravenous Every 24 hours 05/31/17 1207 06/01/17 1758   05/31/17 0630  cefTRIAXone (ROCEPHIN) 2 g in dextrose 5 % 50 mL IVPB  Status:  Discontinued     2 g 100 mL/hr over 30 Minutes Intravenous Every 24 hours 05/31/17 0602 05/31/17 1207   05/31/17 0100  ciprofloxacin (CIPRO) IVPB 400 mg     400 mg 200 mL/hr over 60 Minutes Intravenous  Once 05/31/17 0053 05/31/17 0323       Subjective: -Feels better, cut some IV Dilaudid last night, positive flatus, no BM, breathing improving, walked in the halls with her husband yesterday  Objective: Vitals:   06/03/17 0527 06/03/17 1407 06/03/17 2200 06/04/17 0423  BP: (!) 120/42 133/62 127/80 139/77  Pulse: 86 76 86 76  Resp: 16 16 16 18   Temp: 98.7 F (37.1 C) 98.2 F (36.8 C) 98.2 F (36.8 C) 98.4 F (36.9 C)  TempSrc: Oral Oral Oral Oral  SpO2: 98% 96% 95% 99%  Weight:  Height:        Intake/Output Summary (Last 24 hours) at 06/04/17 1256 Last data filed at 06/03/17 2330  Gross per 24 hour  Intake              640 ml  Output              200 ml  Net              440 ml   Filed Weights   05/30/17 2002 06/01/17 1058  Weight: 120.2 kg (265 lb) 120.2 kg (265 lb)    Examination:  Gen: Obese, ill-appearing female, sitting in recliner, more alert today, no distress HEENT: PERRLA, Neck supple, no JVD Lungs: Improved air movement but decreased breath sounds at both bases CVS: RRR,No Gallops,Rubs or new Murmurs Abd: Soft obese, mildly  distended, port sites clean, bowel sounds present Extremities: 1+ edema bilaterally Skin: no new rashes  Data Reviewed: I have personally reviewed following labs and imaging studies  CBC:  Recent Labs Lab 05/30/17 2145 06/01/17 0450 06/02/17 0605 06/03/17 0521 06/04/17 0518  WBC 12.4* 19.7* 18.2* 15.1* 10.3  NEUTROABS  --   --  14.7* 12.1* 7.5  HGB 13.2 13.0 11.5* 11.8* 10.9*  HCT 41.8 40.4 37.8 38.6 35.5*  MCV 93.3 92.7 96.4 97.2 95.7  PLT 211 177 169 168 191   Basic Metabolic Panel:  Recent Labs Lab 05/30/17 2145 06/01/17 0450 06/02/17 0540 06/03/17 0521 06/04/17 0518  NA 141 141 140 141 140  K 4.6 4.1 4.7 4.4 4.0  CL 108 108 106 108 104  CO2 24 26 27 26 28   GLUCOSE 141* 113* 110* 100* 105*  BUN 18 11 16  21* 18  CREATININE 0.77 0.66 0.82 1.08* 0.83  CALCIUM 8.9 8.6* 8.3* 8.6* 8.8*   GFR: Estimated Creatinine Clearance: 86.2 mL/min (by C-G formula based on SCr of 0.83 mg/dL). Liver Function Tests:  Recent Labs Lab 05/30/17 2145 06/01/17 0450  AST 17 18  ALT 14 14  ALKPHOS 65 47  BILITOT 0.4 0.7  PROT 7.8 6.5  ALBUMIN 4.1 3.2*    Recent Labs Lab 05/30/17 2145  LIPASE 24   No results for input(s): AMMONIA in the last 168 hours. Coagulation Profile: No results for input(s): INR, PROTIME in the last 168 hours. Cardiac Enzymes: No results for input(s): CKTOTAL, CKMB, CKMBINDEX, TROPONINI in the last 168 hours. BNP (last 3 results) No results for input(s): PROBNP in the last 8760 hours. HbA1C: No results for input(s): HGBA1C in the last 72 hours. CBG: No results for input(s): GLUCAP in the last 168 hours. Lipid Profile: No results for input(s): CHOL, HDL, LDLCALC, TRIG, CHOLHDL, LDLDIRECT in the last 72 hours. Thyroid Function Tests: No results for input(s): TSH, T4TOTAL, FREET4, T3FREE, THYROIDAB in the last 72 hours. Anemia Panel: No results for input(s): VITAMINB12, FOLATE, FERRITIN, TIBC, IRON, RETICCTPCT in the last 72 hours. Sepsis  Labs: No results for input(s): PROCALCITON, LATICACIDVEN in the last 168 hours.  Recent Results (from the past 240 hour(s))  Urine culture     Status: Abnormal   Collection Time: 05/31/17 12:53 AM  Result Value Ref Range Status   Specimen Description URINE, CLEAN CATCH  Final   Special Requests NONE  Final   Culture >=100,000 COLONIES/mL ESCHERICHIA COLI (A)  Final   Report Status 06/02/2017 FINAL  Final   Organism ID, Bacteria ESCHERICHIA COLI (A)  Final      Susceptibility   Escherichia coli - MIC*  AMPICILLIN >=32 RESISTANT Resistant     CEFAZOLIN <=4 SENSITIVE Sensitive     CEFTRIAXONE <=1 SENSITIVE Sensitive     CIPROFLOXACIN <=0.25 SENSITIVE Sensitive     GENTAMICIN >=16 RESISTANT Resistant     IMIPENEM <=0.25 SENSITIVE Sensitive     NITROFURANTOIN <=16 SENSITIVE Sensitive     TRIMETH/SULFA <=20 SENSITIVE Sensitive     AMPICILLIN/SULBACTAM >=32 RESISTANT Resistant     PIP/TAZO <=4 SENSITIVE Sensitive     Extended ESBL NEGATIVE Sensitive     * >=100,000 COLONIES/mL ESCHERICHIA COLI       Radiology Studies: Dg Chest 2 View  Result Date: 06/03/2017 CLINICAL DATA:  Hypoxia EXAM: CHEST - 2 VIEW COMPARISON:  12/01/2016 FINDINGS: Lower lung volumes with new bibasilar patchy atelectasis or infiltrate left greater than right. The posterior aspect of the right diaphragmatic leaflet is obscured and can't entirely exclude small effusion. Heart size upper limits normal for technique. Tortuous thoracic aorta. No pneumothorax. Visualized bones unremarkable. Surgical clips in the upper abdomen. IMPRESSION: 1. Low volumes with new bibasilar atelectasis or infiltrates. Electronically Signed   By: Corlis Leak M.D.   On: 06/03/2017 10:03      Scheduled Meds: . carvedilol  3.125 mg Oral BID WC  . ciprofloxacin  250 mg Oral BID  . docusate sodium  100 mg Oral BID  . enoxaparin (LOVENOX) injection  60 mg Subcutaneous Q24H  . feeding supplement  1 Container Oral BID BM  . pantoprazole  40  mg Oral Q1200   Continuous Infusions:    LOS: 3 days    Time spent: 30 minutes   Zannie Cove, MD [facility] Page via [email or web address] Password [email or web address] 06/04/2017, 12:56 PM

## 2017-06-04 NOTE — Progress Notes (Signed)
[facility] Progress Note  3 Days Post-Op  Subjective: CC:  Sitting up in chair, asleep. Denies SOB. Taking tylenol and oxycodone for pain. Tolerating PO. +flatus. Denies BM. Using IS (750 cc) and flutter valve. Intermittently requiring O2 with ambulation.  Objective: Vital signs in last 24 hours: Temp:  [98.2 F (36.8 C)-98.4 F (36.9 C)] 98.4 F (36.9 C) (08/09 0423) Pulse Rate:  [76-86] 76 (08/09 0423) Resp:  [16-18] 18 (08/09 0423) BP: (127-139)/(62-80) 139/77 (08/09 0423) SpO2:  [95 %-99 %] 99 % (08/09 0423) Last BM Date: 05/31/17  Intake/Output from previous day: 08/08 0701 - 08/09 0700 In: 640 [P.O.:240; IV Piggyback:400] Out: 200 [Urine:200] Intake/Output this shift: No intake/output data recorded.  PE: Gen:  Alert, NAD, pleasant Card:  Regular rate and rhythm Pulm:  Normal effort, crackles at right lung base  Abd: Soft, appropriately tender, incisions c/d/i  Skin: warm and dry, no rashes  Psych: A&Ox3   Lab Results:   Recent Labs  06/03/17 0521 06/04/17 0518  WBC 15.1* 10.3  HGB 11.8* 10.9*  HCT 38.6 35.5*  PLT 168 191   BMET  Recent Labs  06/03/17 0521 06/04/17 0518  NA 141 140  K 4.4 4.0  CL 108 104  CO2 26 28  GLUCOSE 100* 105*  BUN 21* 18  CREATININE 1.08* 0.83  CALCIUM 8.6* 8.8*   PT/INR No results for input(s): LABPROT, INR in the last 72 hours. CMP     Component Value Date/Time   NA 140 06/04/2017 0518   K 4.0 06/04/2017 0518   CL 104 06/04/2017 0518   CO2 28 06/04/2017 0518   GLUCOSE 105 (H) 06/04/2017 0518   BUN 18 06/04/2017 0518   CREATININE 0.83 06/04/2017 0518   CALCIUM 8.8 (L) 06/04/2017 0518   PROT 6.5 06/01/2017 0450   ALBUMIN 3.2 (L) 06/01/2017 0450   AST 18 06/01/2017 0450   ALT 14 06/01/2017 0450   ALKPHOS 47 06/01/2017 0450   BILITOT 0.7 06/01/2017 0450   GFRNONAA >60 06/04/2017 0518   GFRAA >60 06/04/2017 0518   Lipase     Component Value Date/Time   LIPASE 24 05/30/2017 2145        Studies/Results: Dg Chest 2 View  Result Date: 06/03/2017 CLINICAL DATA:  Hypoxia EXAM: CHEST - 2 VIEW COMPARISON:  12/01/2016 FINDINGS: Lower lung volumes with new bibasilar patchy atelectasis or infiltrate left greater than right. The posterior aspect of the right diaphragmatic leaflet is obscured and can't entirely exclude small effusion. Heart size upper limits normal for technique. Tortuous thoracic aorta. No pneumothorax. Visualized bones unremarkable. Surgical clips in the upper abdomen. IMPRESSION: 1. Low volumes with new bibasilar atelectasis or infiltrates. Electronically Signed   By: Corlis Leak M.D.   On: 06/03/2017 10:03    Anti-infectives: Anti-infectives    Start     Dose/Rate Route Frequency Ordered Stop   06/02/17 0900  ciprofloxacin (CIPRO) IVPB 400 mg     400 mg 200 mL/hr over 60 Minutes Intravenous Every 12 hours 06/02/17 0746     06/01/17 0800  cefTRIAXone (ROCEPHIN) 1 g in dextrose 5 % 50 mL IVPB  Status:  Discontinued     1 g 100 mL/hr over 30 Minutes Intravenous Every 24 hours 05/31/17 1207 06/01/17 1758   05/31/17 0630  cefTRIAXone (ROCEPHIN) 2 g in dextrose 5 % 50 mL IVPB  Status:  Discontinued     2 g 100 mL/hr over 30 Minutes Intravenous Every 24 hours 05/31/17 0602 05/31/17 1207  05/31/17 0100  ciprofloxacin (CIPRO) IVPB 400 mg     400 mg 200 mL/hr over 60 Minutes Intravenous  Once 05/31/17 0053 05/31/17 0323     Assessment/Plan Acute on chronic calculous cholecystitis POD#3 s/p laparoscopic cholecystectomy 06/01/17 Dr. Gaynelle Adu    Escherichia coli UTI - on Cipro Hypertension Nonischemic cardiomyopathy EF 45% grade 1 diastolic dysfunction. GERD Morbid obesity - Body mass index is 39.13 kg/m. Difficulty with ambulation - chronic COPD with chronic cough FEN -  HH diet  VTE - Lovenox ID - cipro 8/6>>, leukocytosis resolved, no PO abx at discharge from surgical perspective  Plan - tolerating diet, pain controlled on orals, I believe she  would benefit from continued diuresis, pulmonary toilet, and mobilizing with therapies today. Plan for discharge tomorrow if medically stable.   To [facility] office for staple removal and post-op follow up 8/23 Therapies recommending HH PT/OT at discharge   LOS: 3 days    Adam Phenix , [facility] 06/04/2017, 7:58 AM Pager: [phone] Consults: [phone] Mon-Fri 7:00 am-4:30 pm Sat-Sun 7:00 am-11:30 am

## 2017-06-04 NOTE — Progress Notes (Signed)
OT Note:  Evaluation was hand written and placed in ghost chart yesterday as EPIC was down.  We will follow her in acute setting.  Pt needs 24/7 and HHOT.  Marica Otter, [place] [phone] 06/04/2017

## 2017-06-04 NOTE — Progress Notes (Signed)
Occupational Therapy Treatment Patient Details Name: Crystal Yoder MRN: [id number] DOB: [birth year] Today's Date: 06/04/2017    History of present illness 71 yo female s/p lap cholectystectomy 06/01/17. Hx of HTN, COPD, obesity   OT comments  Pt liimited by pain. Educated on bed mobility this pm.    Follow Up Recommendations  Home health OT;Supervision/Assistance - 24 hour    Equipment Recommendations  None recommended by OT (pt has 3:1 commode at home)    Recommendations for Other Services      Precautions / Restrictions Precautions Precautions: Fall Precaution Comments: abd surgery. monitor O2       Mobility Bed Mobility             Sit to sidelying: Min assist General bed mobility comments: demonstrated and educated on sit<>sidelying for comfort.  Pt does not have a recliner at home  Transfers   Equipment used: Rolling walker (2 wheeled)   Sit to Stand: Min guard         General transfer comment: for safety and cues for UE placement    Balance                                           ADL either performed or assessed with clinical judgement   ADL Overall ADL's : Needs assistance/impaired                         Toilet Transfer: Min guard;Stand-pivot;RW (chair to bed)             General ADL Comments: husband can assist with LB adls when she returns home     Vision       Perception     Praxis      Cognition Arousal/Alertness: Awake/alert Behavior During Therapy: WFL for tasks assessed/performed Overall Cognitive Status: Within Functional Limits for tasks assessed                                          Exercises     Shoulder Instructions       General Comments      Pertinent Vitals/ Pain       Pain Score: 9  Pain Location: abdomen Pain Descriptors / Indicators: Aching Pain Intervention(s): Limited activity within patient's tolerance;Monitored during session;Repositioned;Patient  requesting pain meds-RN notified  Home Living                                          Prior Functioning/Environment              Frequency  Min 2X/week        Progress Toward Goals  OT Goals(current goals can now be found in the care plan section)  Progress towards OT goals: Progressing toward goals     Plan Discharge plan remains appropriate    Co-evaluation                 AM-PAC PT "6 Clicks" Daily Activity     Outcome Measure   Help from another person eating meals?: None Help from another person taking care of personal grooming?: A Little Help from another person toileting, which  includes using toliet, bedpan, or urinal?: A Little Help from another person bathing (including washing, rinsing, drying)?: A Lot Help from another person to put on and taking off regular upper body clothing?: A Little Help from another person to put on and taking off regular lower body clothing?: A Lot 6 Click Score: 17    End of Session    OT Visit Diagnosis: Muscle weakness (generalized) (M62.81)   Activity Tolerance Treatment limited secondary to agitation   Patient Left in bed;with call bell/phone within reach;with bed alarm set;with nursing/sitter in room   Nurse Communication          Time: 2111-5520 OT Time Calculation (min): 17 min  Charges: OT General Charges $OT Visit: 1 Procedure OT Treatments $Therapeutic Activity: 8-22 mins  Marica Otter, OTR/L [phone] 06/04/2017   Crystal Yoder 06/04/2017, 3:13 PM

## 2017-06-05 LAB — CBC WITH DIFFERENTIAL/PLATELET
BASOS ABS: 0 10*3/uL (ref 0.0–0.1)
Basophils Relative: 0 %
EOS PCT: 8 %
Eosinophils Absolute: 0.6 10*3/uL (ref 0.0–0.7)
HEMATOCRIT: 33.7 % — AB (ref 36.0–46.0)
HEMOGLOBIN: 10.5 g/dL — AB (ref 12.0–15.0)
LYMPHS PCT: 17 %
Lymphs Abs: 1.4 10*3/uL (ref 0.7–4.0)
MCH: 29.6 pg (ref 26.0–34.0)
MCHC: 31.2 g/dL (ref 30.0–36.0)
MCV: 94.9 fL (ref 78.0–100.0)
Monocytes Absolute: 1 10*3/uL (ref 0.1–1.0)
Monocytes Relative: 12 %
NEUTROS ABS: 5.5 10*3/uL (ref 1.7–7.7)
NEUTROS PCT: 63 %
PLATELETS: 191 10*3/uL (ref 150–400)
RBC: 3.55 MIL/uL — ABNORMAL LOW (ref 3.87–5.11)
RDW: 14.7 % (ref 11.5–15.5)
WBC: 8.6 10*3/uL (ref 4.0–10.5)

## 2017-06-05 LAB — BASIC METABOLIC PANEL
ANION GAP: 8 (ref 5–15)
BUN: 15 mg/dL (ref 6–20)
CHLORIDE: 104 mmol/L (ref 101–111)
CO2: 31 mmol/L (ref 22–32)
Calcium: 8.7 mg/dL — ABNORMAL LOW (ref 8.9–10.3)
Creatinine, Ser: 0.77 mg/dL (ref 0.44–1.00)
GFR calc Af Amer: >60 mL/min (ref >60)
GLUCOSE: 101 mg/dL — AB (ref 65–99)
POTASSIUM: 3.6 mmol/L (ref 3.5–5.1)
Sodium: 143 mmol/L (ref 135–145)

## 2017-06-05 MED ORDER — POLYETHYLENE GLYCOL 3350 17 G PO PACK
17.0000 g | PACK | Freq: Two times a day (BID) | ORAL | Status: DC
  Administered 2017-06-05 – 2017-06-06 (×3): 17 g via ORAL
  Filled 2017-06-05 (×2): qty 1

## 2017-06-05 MED ORDER — GADOBENATE DIMEGLUMINE 529 MG/ML IV SOLN
20.0000 mL | Freq: Once | INTRAVENOUS | Status: AC | PRN
  Administered 2017-06-05: 20 mL via INTRAVENOUS

## 2017-06-05 MED ORDER — BISACODYL 5 MG PO TBEC
10.0000 mg | DELAYED_RELEASE_TABLET | Freq: Once | ORAL | Status: AC
  Administered 2017-06-05: 10 mg via ORAL
  Filled 2017-06-05: qty 2

## 2017-06-05 MED ORDER — POLYETHYLENE GLYCOL 3350 17 G PO PACK: 17.0000 g | PACK | Freq: Every day | ORAL | Status: DC

## 2017-06-05 MED ORDER — OXYCODONE HCL 5 MG PO TABS
5.0000 mg | ORAL_TABLET | Freq: Four times a day (QID) | ORAL | Status: DC | PRN
  Administered 2017-06-05 – 2017-06-06 (×3): 5 mg via ORAL
  Filled 2017-06-05 (×3): qty 1

## 2017-06-05 NOTE — Progress Notes (Signed)
Upper midline incision dressing clean, dry and intact. No need to change. Lina Sar, RN

## 2017-06-05 NOTE — Care Management Important Message (Signed)
Important Message  Patient Details  Name: Crystal Yoder MRN: [id number] Date of Birth: May 10, 1946   Medicare Important Message Given:  Yes    Caren Macadam 06/05/2017, 10:16 AMImportant Message  Patient Details  Name: Crystal Yoder MRN: [id number] Date of Birth: 10/09/1946   Medicare Important Message Given:  Yes    Caren Macadam 06/05/2017, 10:16 AM

## 2017-06-05 NOTE — Progress Notes (Signed)
PROGRESS NOTE    Crystal Yoder  YQM:[id number] DOB: 02/04/1946 DOA: 05/30/2017 PCP: Laurann Montana, MD     Brief Narrative:  Crystal Yoder is a 71 yo female with past medical hx NICM EF 45%, HTN, COPD, GERD, OSA, and morbid obesity who presented to the ED with 1 day history of diffuse abdominal pain.  Work up in the ED revealed cholelithiasis with mild pericholecystic free fluid, and dilatation of the common bile duct to 1.2 cm in diameter, raising concern for distal obstruction, gallbladder wall thickening, with cholelithiasis and sludge in the gallbladder. General surgery was consulted and she underwent lap chole on 8/6. Slow to recover since then. Developed cough, atelactsis -improving Still complains of mod ongoing abd pain, pain control remains an issue  Assessment & Plan:   Acute cholecystitis -MRCP without evidence of choledocholithiasis  -General surgery consulted,  -s/p laparoscopic cholecystectomy 8/6 with Dr. Andrey Campanile -very slow to recover post op, continues to complain of mod to severe abd pain but tolertaing diet and labs unremarkable -increase activity, incentive spirometry, ambulate -add laxatives now -cut down pain meds further  Hypoxia -due to atelectasis and some volume overload -she was 9L positive, stopped IVF -Improved with IV Lasix and incentive spirometry, weaned off O2 at rest -OOB, ambulate  E Coli UTI POA -Urine culture with > 100,000 E Coli, sensitive to cipro -completed 5days of CIpro, will stop now  HTN -hold Cozaar, continue coreg  -BP improved with diuresis  NICM -EF 45%, grade 1 diastolic dysfunction. Follows with Dr. Tenny Craw, cardiology  -Volume status much improved, after few doses of IV lasix -euvolemic, hold lasix now  GERD -PPI changed to by mouth  Nodule on Chest CT -Follow up with PCP in 1 year  OSA -Failed CPAP   Chronic cough secondary to COPD, allergic rhinitis, GERD -Follows with Dr. Maple Hudson, pulmonology   DVT prophylaxis:  lovenox Code Status: full Family Communication: husband at bedside  Disposition Plan: home tomorrow    Consultants:   CCS  Procedures: Laparoscopic cholecystectomy 8/6  Antimicrobials:  Anti-infectives    Start     Dose/Rate Route Frequency Ordered Stop   06/04/17 1100  ciprofloxacin (CIPRO) tablet 250 mg  Status:  Discontinued     250 mg Oral 2 times daily 06/04/17 1036 06/05/17 1005   06/02/17 0900  ciprofloxacin (CIPRO) IVPB 400 mg  Status:  Discontinued     400 mg 200 mL/hr over 60 Minutes Intravenous Every 12 hours 06/02/17 0746 06/04/17 1036   06/01/17 0800  cefTRIAXone (ROCEPHIN) 1 g in dextrose 5 % 50 mL IVPB  Status:  Discontinued     1 g 100 mL/hr over 30 Minutes Intravenous Every 24 hours 05/31/17 1207 06/01/17 1758   05/31/17 0630  cefTRIAXone (ROCEPHIN) 2 g in dextrose 5 % 50 mL IVPB  Status:  Discontinued     2 g 100 mL/hr over 30 Minutes Intravenous Every 24 hours 05/31/17 0602 05/31/17 1207   05/31/17 0100  ciprofloxacin (CIPRO) IVPB 400 mg     400 mg 200 mL/hr over 60 Minutes Intravenous  Once 05/31/17 0053 05/31/17 0323       Subjective: -Feels better, cut some IV Dilaudid last night, positive flatus, no BM, breathing improving, walked in the halls with her husband yesterday  Objective: Vitals:   06/04/17 0423 06/04/17 1422 06/04/17 2055 06/05/17 0640  BP: 139/77 125/69 (!) 151/82 111/71  Pulse: 76 71 73 71  Resp: 18 18 18 16   Temp: 98.4 F (36.9  C) 98.6 F (37 C) 98.4 F (36.9 C) 98.3 F (36.8 C)  TempSrc: Oral Oral Oral Oral  SpO2: 99% 95% 100% 100%  Weight:      Height:        Intake/Output Summary (Last 24 hours) at 06/05/17 1435 Last data filed at 06/05/17 0641  Gross per 24 hour  Intake              540 ml  Output              100 ml  Net              440 ml   Filed Weights   05/30/17 2002 06/01/17 1058  Weight: 120.2 kg (265 lb) 120.2 kg (265 lb)    Examination:  Gen: Obese, ill-appearing female, sitting in recliner, more  alert today, no distress HEENT: PERRLA, Neck supple, no JVD Lungs: Improved air movement but decreased breath sounds at both bases CVS: RRR,No Gallops,Rubs or new Murmurs Abd: Soft obese, mildly distended, port sites clean, bowel sounds present Extremities: 1+ edema bilaterally Skin: no new rashes  Data Reviewed: I have personally reviewed following labs and imaging studies  CBC:  Recent Labs Lab 06/01/17 0450 06/02/17 0605 06/03/17 0521 06/04/17 0518 06/05/17 0538  WBC 19.7* 18.2* 15.1* 10.3 8.6  NEUTROABS  --  14.7* 12.1* 7.5 5.5  HGB 13.0 11.5* 11.8* 10.9* 10.5*  HCT 40.4 37.8 38.6 35.5* 33.7*  MCV 92.7 96.4 97.2 95.7 94.9  PLT 177 169 168 191 191   Basic Metabolic Panel:  Recent Labs Lab 06/01/17 0450 06/02/17 0540 06/03/17 0521 06/04/17 0518 06/05/17 0538  NA 141 140 141 140 143  K 4.1 4.7 4.4 4.0 3.6  CL 108 106 108 104 104  CO2 26 27 26 28 31   GLUCOSE 113* 110* 100* 105* 101*  BUN 11 16 21* 18 15  CREATININE 0.66 0.82 1.08* 0.83 0.77  CALCIUM 8.6* 8.3* 8.6* 8.8* 8.7*   GFR: Estimated Creatinine Clearance: 89.4 mL/min (by C-G formula based on SCr of 0.77 mg/dL). Liver Function Tests:  Recent Labs Lab 05/30/17 2145 06/01/17 0450  AST 17 18  ALT 14 14  ALKPHOS 65 47  BILITOT 0.4 0.7  PROT 7.8 6.5  ALBUMIN 4.1 3.2*    Recent Labs Lab 05/30/17 2145  LIPASE 24   No results for input(s): AMMONIA in the last 168 hours. Coagulation Profile: No results for input(s): INR, PROTIME in the last 168 hours. Cardiac Enzymes: No results for input(s): CKTOTAL, CKMB, CKMBINDEX, TROPONINI in the last 168 hours. BNP (last 3 results) No results for input(s): PROBNP in the last 8760 hours. HbA1C: No results for input(s): HGBA1C in the last 72 hours. CBG: No results for input(s): GLUCAP in the last 168 hours. Lipid Profile: No results for input(s): CHOL, HDL, LDLCALC, TRIG, CHOLHDL, LDLDIRECT in the last 72 hours. Thyroid Function Tests: No results for  input(s): TSH, T4TOTAL, FREET4, T3FREE, THYROIDAB in the last 72 hours. Anemia Panel: No results for input(s): VITAMINB12, FOLATE, FERRITIN, TIBC, IRON, RETICCTPCT in the last 72 hours. Sepsis Labs: No results for input(s): PROCALCITON, LATICACIDVEN in the last 168 hours.  Recent Results (from the past 240 hour(s))  Urine culture     Status: Abnormal   Collection Time: 05/31/17 12:53 AM  Result Value Ref Range Status   Specimen Description URINE, CLEAN CATCH  Final   Special Requests NONE  Final   Culture >=100,000 COLONIES/mL ESCHERICHIA COLI (A)  Final   Report  Status 06/02/2017 FINAL  Final   Organism ID, Bacteria ESCHERICHIA COLI (A)  Final      Susceptibility   Escherichia coli - MIC*    AMPICILLIN >=32 RESISTANT Resistant     CEFAZOLIN <=4 SENSITIVE Sensitive     CEFTRIAXONE <=1 SENSITIVE Sensitive     CIPROFLOXACIN <=0.25 SENSITIVE Sensitive     GENTAMICIN >=16 RESISTANT Resistant     IMIPENEM <=0.25 SENSITIVE Sensitive     NITROFURANTOIN <=16 SENSITIVE Sensitive     TRIMETH/SULFA <=20 SENSITIVE Sensitive     AMPICILLIN/SULBACTAM >=32 RESISTANT Resistant     PIP/TAZO <=4 SENSITIVE Sensitive     Extended ESBL NEGATIVE Sensitive     * >=100,000 COLONIES/mL ESCHERICHIA COLI       Radiology Studies: No results found.    Scheduled Meds: . carvedilol  3.125 mg Oral BID WC  . docusate sodium  100 mg Oral BID  . enoxaparin (LOVENOX) injection  60 mg Subcutaneous Q24H  . feeding supplement  1 Container Oral BID BM  . pantoprazole  40 mg Oral Q1200  . polyethylene glycol  17 g Oral BID   Continuous Infusions:    LOS: 4 days    Time spent: 30 minutes   Zannie Cove, MD [facility] Page via [email or web address] Password [email or web address] 06/05/2017, 2:35 PM

## 2017-06-05 NOTE — Progress Notes (Signed)
[facility] Progress Note  4 Days Post-Op  Subjective: CC:  Somnolent and a little confused but oriented to person, place, time. Abdominal pain controlled, described as soreness. Mobilizing with therapies. Ate 75-100% of her breakfast. Denies SOB.  Objective: Vital signs in last 24 hours: Temp:  [98.3 F (36.8 C)-98.6 F (37 C)] 98.3 F (36.8 C) (08/10 0640) Pulse Rate:  [71-73] 71 (08/10 0640) Resp:  [16-18] 16 (08/10 0640) BP: (111-151)/(69-82) 111/71 (08/10 0640) SpO2:  [95 %-100 %] 100 % (08/10 0640) Last BM Date: 05/31/17  Intake/Output from previous day: 08/09 0701 - 08/10 0700 In: 540 [P.O.:540] Out: 300 [Urine:300] Intake/Output this shift: No intake/output data recorded.  PE: Gen:  Alert, NAD, pleasant Card:  Regular rate and rhythm Pulm:  Normal effort, CTAB Abd: Soft, appropriately tender, incisions c/d/i  Skin: warm and dry, no rashes  Psych: A&Ox3   Lab Results:   Recent Labs  06/04/17 0518 06/05/17 0538  WBC 10.3 8.6  HGB 10.9* 10.5*  HCT 35.5* 33.7*  PLT 191 191   BMET  Recent Labs  06/04/17 0518 06/05/17 0538  NA 140 143  K 4.0 3.6  CL 104 104  CO2 28 31  GLUCOSE 105* 101*  BUN 18 15  CREATININE 0.83 0.77  CALCIUM 8.8* 8.7*   PT/INR No results for input(s): LABPROT, INR in the last 72 hours. CMP     Component Value Date/Time   NA 143 06/05/2017 0538   K 3.6 06/05/2017 0538   CL 104 06/05/2017 0538   CO2 31 06/05/2017 0538   GLUCOSE 101 (H) 06/05/2017 0538   BUN 15 06/05/2017 0538   CREATININE 0.77 06/05/2017 0538   CALCIUM 8.7 (L) 06/05/2017 0538   PROT 6.5 06/01/2017 0450   ALBUMIN 3.2 (L) 06/01/2017 0450   AST 18 06/01/2017 0450   ALT 14 06/01/2017 0450   ALKPHOS 47 06/01/2017 0450   BILITOT 0.7 06/01/2017 0450   GFRNONAA >60 06/05/2017 0538   GFRAA >60 06/05/2017 0538   Lipase     Component Value Date/Time   LIPASE 24 05/30/2017 2145       Studies/Results: Dg Chest 2 View  Result Date:  06/03/2017 CLINICAL DATA:  Hypoxia EXAM: CHEST - 2 VIEW COMPARISON:  12/01/2016 FINDINGS: Lower lung volumes with new bibasilar patchy atelectasis or infiltrate left greater than right. The posterior aspect of the right diaphragmatic leaflet is obscured and can't entirely exclude small effusion. Heart size upper limits normal for technique. Tortuous thoracic aorta. No pneumothorax. Visualized bones unremarkable. Surgical clips in the upper abdomen. IMPRESSION: 1. Low volumes with new bibasilar atelectasis or infiltrates. Electronically Signed   By: Corlis Leak M.D.   On: 06/03/2017 10:03    Anti-infectives: Anti-infectives    Start     Dose/Rate Route Frequency Ordered Stop   06/04/17 1100  ciprofloxacin (CIPRO) tablet 250 mg     250 mg Oral 2 times daily 06/04/17 1036 06/06/17 0759   06/02/17 0900  ciprofloxacin (CIPRO) IVPB 400 mg  Status:  Discontinued     400 mg 200 mL/hr over 60 Minutes Intravenous Every 12 hours 06/02/17 0746 06/04/17 1036   06/01/17 0800  cefTRIAXone (ROCEPHIN) 1 g in dextrose 5 % 50 mL IVPB  Status:  Discontinued     1 g 100 mL/hr over 30 Minutes Intravenous Every 24 hours 05/31/17 1207 06/01/17 1758   05/31/17 0630  cefTRIAXone (ROCEPHIN) 2 g in dextrose 5 % 50 mL IVPB  Status:  Discontinued  2 g 100 mL/hr over 30 Minutes Intravenous Every 24 hours 05/31/17 0602 05/31/17 1207   05/31/17 0100  ciprofloxacin (CIPRO) IVPB 400 mg     400 mg 200 mL/hr over 60 Minutes Intravenous  Once 05/31/17 0053 05/31/17 0323     Assessment/Plan 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 GERD Morbid obesity - Body mass index is 39.13 kg/m. Difficulty with ambulation - chronic COPD with chronic cough FEN - HH diet   VTE - Lovenox ID -cipro 8/6>>, leukocytosis resolved, no PO abx at discharge from surgical perspective  Plan - stable for discharge from surgical perspective. F/U provided. No role for abx at discharge.    LOS: 4 days    Adam Phenix , [facility] 06/05/2017, 8:30 AM Pager: [phone] Consults: [phone] Mon-Fri 7:00 am-4:30 pm Sat-Sun 7:00 am-11:30 am

## 2017-06-06 ENCOUNTER — Telehealth: Payer: Self-pay | Admitting: General Surgery

## 2017-06-06 LAB — CBC WITH DIFFERENTIAL/PLATELET
Basophils Absolute: 0 10*3/uL (ref 0.0–0.1)
Basophils Relative: 0 %
EOS ABS: 0.5 10*3/uL (ref 0.0–0.7)
EOS PCT: 6 %
HCT: 35.1 % — ABNORMAL LOW (ref 36.0–46.0)
Hemoglobin: 11.1 g/dL — ABNORMAL LOW (ref 12.0–15.0)
LYMPHS ABS: 1.5 10*3/uL (ref 0.7–4.0)
LYMPHS PCT: 17 %
MCH: 29.3 pg (ref 26.0–34.0)
MCHC: 31.6 g/dL (ref 30.0–36.0)
MCV: 92.6 fL (ref 78.0–100.0)
MONO ABS: 0.7 10*3/uL (ref 0.1–1.0)
MONOS PCT: 9 %
Neutro Abs: 5.7 10*3/uL (ref 1.7–7.7)
Neutrophils Relative %: 68 %
PLATELETS: 232 10*3/uL (ref 150–400)
RBC: 3.79 MIL/uL — ABNORMAL LOW (ref 3.87–5.11)
RDW: 14.4 % (ref 11.5–15.5)
WBC: 8.4 10*3/uL (ref 4.0–10.5)

## 2017-06-06 LAB — BASIC METABOLIC PANEL
Anion gap: 11 (ref 5–15)
BUN: 13 mg/dL (ref 6–20)
CALCIUM: 9 mg/dL (ref 8.9–10.3)
CO2: 29 mmol/L (ref 22–32)
CREATININE: 0.73 mg/dL (ref 0.44–1.00)
Chloride: 105 mmol/L (ref 101–111)
GFR calc Af Amer: >60 mL/min (ref >60)
GLUCOSE: 104 mg/dL — AB (ref 65–99)
POTASSIUM: 3.6 mmol/L (ref 3.5–5.1)
SODIUM: 145 mmol/L (ref 135–145)

## 2017-06-06 MED ORDER — MENTHOL 3 MG MT LOZG: 1.0000 | LOZENGE | OROMUCOSAL | Status: DC | PRN

## 2017-06-06 MED ORDER — POLYETHYLENE GLYCOL 3350 17 G PO PACK: 17.0000 g | PACK | Freq: Every day | ORAL | 0 refills | Status: DC | PRN

## 2017-06-06 MED ORDER — OXYCODONE HCL 5 MG PO TABS: 5.0000 mg | ORAL_TABLET | Freq: Four times a day (QID) | ORAL | 0 refills | Status: DC | PRN

## 2017-06-06 NOTE — Progress Notes (Signed)
Dr. Jomarie Longs aware via text that pt was wheezy earlier this shift. No distress noted. Pt spent 45 minutes up in BR recently and ambulating in room preparing for dc home with assist of NT, Myeisha. Following activity pt O2 sats was 95% on RA w/ no distress or complaints voiced.

## 2017-06-06 NOTE — Care Management Note (Addendum)
Case Management Note  Patient Details  Name: Crystal Yoder MRN: [id number] Date of Birth: 01/30/1946  Subjective/Objective:    Acute cholecystitis, Lap Cholecystectomy 06/01/2017                 Action/Plan: Discharge Planning: NCM spoke to pt and friend's Dewayne Hatch and Okey Regal at bedside. Pt states she has RW at home. Offered choice for HH/list provided. Pt agreeable to [facility]. Contacted [facility] to arrange [facility]. Husband and son at home to assist with care.   PCP WHITE, CYNTHIA MD  Expected Discharge Date:  06/06/17               Expected Discharge Plan:  Home w Home Health Services  In-House Referral:  NA  Discharge planning Services  CM Consult  Post Acute Care Choice:  Home Health Choice offered to:  Patient  DME Arranged:  N/A DME Agency:  NA  HH Arranged:  PT HH Agency:  [facility] (formerly [facility]  Status of Service:  Completed, signed off  If discussed at [employer], dates discussed:    Additional Comments:  Elliot Cousin, RN 06/06/2017, 12:20 PM

## 2017-06-06 NOTE — Telephone Encounter (Signed)
She called today because she was not able to pick up her prescription for oxycodone. She had a cholecystectomy earlier this week and was discharged today. The prescription was dropped off at her pharmacy. When her husband went back to pick it up, the pharmacy was closed. She was wondering what she could do for pain. I advised him to take some scheduled Tylenol and Advil overnight than in the morning pick up her pain medication.

## 2017-06-06 NOTE — Progress Notes (Signed)
Assessment unchanged. Pt doing well. Husband at bedside. Pt verbalized understanding of dc instructions including medications to resume, follow up care and when to call the doctor. Scripts x 2 given as provided by MD. Discharged via wc to front entrance accompanied by NT and husband.

## 2017-06-07 MED ORDER — METOPROLOL TARTRATE 25 MG PO TABS: 25.0000 mg | ORAL_TABLET | Freq: Once | ORAL | Status: DC

## 2017-06-07 NOTE — Discharge Summary (Signed)
Physician Discharge Summary  Crystal Yoder UJW:[id number] DOB: 03-11-1946 DOA: 05/30/2017  PCP: Crystal Montana, MD  Admit date: 05/30/2017 Discharge date: 06/06/2017  Time spent: 45 minutes  Recommendations for Outpatient Follow-up:  1. [facility] 8/15 for staples removal 2. [facility] MD 8/23 for post op follow up 3. PCP in 1 week 4. Right Upper lobe Lung nodule needs FU Imaging   Discharge Diagnoses:  Principal Problem:   Acute cholecystitis Active Problems:   Obstructive sleep apnea   Cardiomyopathy (HCC)   Essential hypertension   Acute calculous cholecystitis   Hypoxia   Atelectasis  Discharge Condition: stable  Diet recommendation: heart healthy  Filed Weights   05/30/17 2002 06/01/17 1058  Weight: 120.2 kg (265 lb) 120.2 kg (265 lb)    History of present illness:  Crystal Yoder a 71 yo female with past medical hx NICM EF 45%, HTN, COPD, GERD, OSA, and morbid obesity who presented to the ED with 1 day history of diffuse abdominal pain.  Work up in the ED revealed cholelithiasis with mild pericholecystic free fluid, and dilatation of the common bile duct to 1.2 cm in diameter, raising concern for distal obstruction, gallbladder wall thickening, with cholelithiasis and sludge in the gallbladder  Hospital Course:   Acute cholecystitis -admitted with Abd pain, seen by [facility], underwent MRCP which noted no evidence of choledocholithiasis  -s/p laparoscopic cholecystectomy 8/6 with Dr. Andrey Yoder -very slow to recover post op, due to obesity, debility, deconditioning -finally improving, tolertaing diet and ambulating at this time, cut down pain meds and added laxatives with good affect. Advised FU with [facility] for staples removal and MD check  Hypoxia/acute on chronic diastolic CHF -due to atelectasis and some volume overload from iatrogenic peri-op excessive IV fluids -she was 9L positive, stopped IVF, diuresed -Improved with IV Lasix and incentive spirometry, weaned off O2 at rest  E  Coli UTI POA -Urine culture with > 100,000 E Coli, sensitive to cipro -completed 5days of CIpro  HTN -resumed Cozaar, coreg  -BP improved with diuresis  NICM -EF 45%, grade 1 diastolic dysfunction. Follows with Dr. Tenny Yoder, cardiology  -Volume status much improved, after few doses of IV lasix -euvolemic, much improved, not on diuretics at baseline -FU with Dr.ROss  GERD -PPI changed to by mouth  Nodule on Chest CT -Follow up CT chest with PCP in 1 year  OSA -didn't tolerate CPAP   Chronic cough secondary to COPD, allergic rhinitis, GERD -Follows with Dr. Maple Yoder, pulmonology   Consultations:  [facility]  GI  Discharge Exam: Vitals:   06/05/17 2127 06/06/17 0544  BP: (!) 166/90 (!) 143/97  Pulse: 75 71  Resp: 18 20  Temp: 98.3 F (36.8 C) 98.4 F (36.9 C)  SpO2: 96% 91%    General: AAOx3 Cardiovascular: S1S2/RRR Respiratory: improved breath sounds  Discharge Instructions   Discharge Instructions    Diet - low sodium heart healthy    Complete by:  As directed    Increase activity slowly    Complete by:  As directed      Discharge Medication List as of 06/06/2017  1:19 PM    START taking these medications   Details  oxyCODONE (OXY IR/ROXICODONE) 5 MG immediate release tablet Take 1 tablet (5 mg total) by mouth every 6 (six) hours as needed for moderate pain or severe pain., Starting Sat 06/06/2017, Print    polyethylene glycol (MIRALAX / GLYCOLAX) packet Take 17 g by mouth daily as needed for mild constipation., Starting Sat 06/06/2017, Print  CONTINUE these medications which have NOT CHANGED   Details  Ascorbic Acid (VITAMIN C) 1000 MG tablet Take 1,000 mg by mouth daily., Historical Med    Azelastine-Fluticasone (DYMISTA) 137-50 MCG/ACT SUSP Place 2 sprays into both nostrils at bedtime., Starting Thu 05/28/2017, Sample    carvedilol (COREG) 3.125 MG tablet take 1 tablet by mouth twice a day with meals, Normal    cholecalciferol (VITAMIN D) 1000 units  tablet Take 1,000 Units by mouth daily., Historical Med    losartan (COZAAR) 50 MG tablet TAKE 1 TABLET BY MOUTH DAILY, Normal    Multiple Vitamin (MULTIVITAMIN WITH MINERALS) TABS tablet Take 1 tablet by mouth daily., Historical Med    pantoprazole (PROTONIX) 40 MG tablet Take 40 mg by mouth daily. , Historical Med       Allergies  Allergen Reactions  . Penicillins Hives and Other (See Comments)    Has patient had a PCN reaction causing immediate rash, facial/tongue/throat swelling, SOB or lightheadedness with hypotension: No Has patient had a PCN reaction causing severe rash involving mucus membranes or skin necrosis: No Has patient had a PCN reaction that required hospitalization: No Has patient had a PCN reaction occurring within the last 10 years: No If all of the above answers are "NO", then may proceed with Cephalosporin use.  . Sulfa Antibiotics Diarrhea and Nausea And Vomiting  . Sulfonamide Derivatives Diarrhea and Nausea And Vomiting   Follow-up Information    [facility] Follow up on 06/18/2017.   Specialty:  General Surgery Why:  Your appointment is at 1:30 PM, be at the office 30 minutes early, bring photo ID and insurance information. Contact information: [address] [place] [address] [phone]        [facility] [place] Follow up on 06/10/2017.   Specialty:  General Surgery Why:  go at 2:00 pm for staple removal by nurse. please arrive 30 minutes early to get checked in and fill out any necessary paperwork.  Contact information: [address] [place] [address] [phone]       Crystal Montana, MD. Schedule an appointment as soon as possible for a visit in 1 week(s).   Specialty:  Family Medicine Contact information: [phone] [address] [place] [address] [phone]        [facility] Follow up.   Specialty:  Home Health Services Why:  Home Health  Physical Therapy- agency will call to arrange initial visit Contact information: [address] [place] [address] [place] [address] [phone]            The results of significant diagnostics from this hospitalization (including imaging, microbiology, ancillary and laboratory) are listed below for reference.    Significant Diagnostic Studies: Dg Chest 2 View  Result Date: 06/03/2017 CLINICAL DATA:  Hypoxia EXAM: CHEST - 2 VIEW COMPARISON:  12/01/2016 FINDINGS: Lower lung volumes with new bibasilar patchy atelectasis or infiltrate left greater than right. The posterior aspect of the right diaphragmatic leaflet is obscured and can't entirely exclude small effusion. Heart size upper limits normal for technique. Tortuous thoracic aorta. No pneumothorax. Visualized bones unremarkable. Surgical clips in the upper abdomen. IMPRESSION: 1. Low volumes with new bibasilar atelectasis or infiltrates. Electronically Signed   By: Corlis Leak M.D.   On: 06/03/2017 10:03   Ct Abdomen Pelvis W Contrast  Result Date: 05/31/2017 CLINICAL DATA:  Initial evaluation for acute abdominal pain, distension. EXAM: CT ABDOMEN AND PELVIS WITH  CONTRAST TECHNIQUE: Multidetector CT imaging of the abdomen and pelvis was performed using the standard protocol following bolus administration of intravenous contrast. CONTRAST:  ISOVUE-300 IOPAMIDOL (ISOVUE-300) INJECTION 61% COMPARISON:  None available. FINDINGS: Lower chest: Scattered patchy and linear atelectatic changes present within the visualized lung bases. Visualized lung bases are otherwise clear. 5 mm subpleural nodule noted at the peripheral right middle lobe (series 4, image 4), indeterminate. Mild cardiomegaly, partially visualized. Hepatobiliary: Liver demonstrates a normal contrast enhanced appearance. Subcentimeter focus of hyperdense Lee/gas noted within the gallbladder lumen, likely reflecting internal stone. Gallbladder wall somewhat ill-defined with suggestion  of pericholecystic free fluid, suggesting possible mild inflammation. No biliary dilatation. Pancreas: Pancreas within normal limits. Spleen: Somewhat irregular cystic lesion measuring 3.6 cm with scattered calcifications noted within the spleen, of doubtful significance. Spleen otherwise unremarkable. Adrenals/Urinary Tract: Adrenal glands within normal limits. Kidneys equal in size with symmetric enhancement. Scattered cyst noted within the right kidney, largest of which measures 3.6 cm. No nephrolithiasis, hydronephrosis, or focal enhancing renal mass. No hydroureter. Bladder largely decompressed without acute abnormality. Stomach/Bowel: Stomach within normal limits. No evidence for bowel obstruction. Appendix normal. Sigmoid diverticulosis without evidence for acute diverticulitis. No acute inflammatory changes seen about the bowels. Vascular/Lymphatic: Normal intravascular enhancement seen throughout the intra-abdominal aorta and its branch vessels. Mild aorto bi-iliac atherosclerotic disease. No adenopathy. Reproductive: Uterus is absent. Ovaries within normal limits. Pelvic support device in place. Other: No free intraperitoneal air. No free fluid or ascites. Note made of a few varices within the left abdomen. Musculoskeletal: No acute osseus abnormality. No worrisome lytic or blastic osseous lesions. Multilevel degenerate spondylolysis noted within the visualized spine, greatest at L2-3. IMPRESSION: 1. Cholelithiasis with mild pericholecystic free fluid, which may reflect sequelae of acute biliary pathology. Correlation with LFTs suggested. Additionally, further evaluation with dedicated right upper quadrant ultrasound may be helpful for further evaluation as warranted. 2. No other acute intra-abdominal or pelvic process. 3. Sigmoid diverticulosis without evidence for acute diverticulitis. 4. Mild aorto bi-iliac atherosclerotic disease. 5. 5 mm right middle lobe pulmonary nodule, indeterminate. No follow-up  needed if patient is low-risk. Non-contrast chest CT can be considered in 12 months if patient is high-risk. This recommendation follows the consensus statement: Guidelines for Management of Incidental Pulmonary Nodules Detected on CT Images: From the Fleischner Society 2017; Radiology 2017; 284:228-243. Electronically Signed   By: Rise Mu M.D.   On: 05/31/2017 00:54   Mr 3d Recon At Scanner  Result Date: 06/01/2017 CLINICAL DATA:  Cholelithiasis.  Common duct dilatation. EXAM: MRI ABDOMEN WITHOUT AND WITH CONTRAST (INCLUDING MRCP) TECHNIQUE: Multiplanar multisequence MR imaging of the abdomen was performed both before and after the administration of intravenous contrast. Heavily T2-weighted images of the biliary and pancreatic ducts were obtained, and three-dimensional MRCP images were rendered by post processing. CONTRAST:  20 cc of MultiHance. COMPARISON:  CT ultrasound of earlier in the day. FINDINGS: Mild to moderate motion degradation involves the precontrast imaging. The dynamic images are of relatively good quality. Lower chest: Mild cardiomegaly, without pericardial or pleural effusion. Hepatobiliary: No focal liver lesion. Borderline to mild intrahepatic biliary duct dilatation. The common duct is upper normal in size, including at 9 mm on image 61/series 4. No evidence of choledocholithiasis. Multiple gallstones within a distended gallbladder. Moderate gallbladder wall thickening and pericholecystic interstitial infiltration. Pancreas:  Normal, without mass or ductal dilatation. Spleen: 3.2 cm mildly septated cystic lesion within the subcapsular medial spleen, including on image 30/ series 69629. Adrenals/Urinary Tract: Normal adrenal  glands. Normal left kidney. Right renal cysts. No hydronephrosis. Stomach/Bowel: Normal stomach and abdominal bowel loops. Vascular/Lymphatic: Normal caliber of the aorta and branch vessels. No retroperitoneal or retrocrural adenopathy. Other:  No ascites.  Musculoskeletal: No acute osseous abnormality. IMPRESSION: 1. Motion degraded exam. 2. Cholelithiasis with gallbladder wall thickening and pericholecystic interstitial thickening. Given absence of sonographic Murphy sign on ultrasound, findings are suspicious for chronic cholecystitis. 3. Borderline biliary duct dilatation, without evidence of choledocholithiasis. 4. 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 Presuming no left upper quadrant symptoms or fever, of doubtful clinical significance. Electronically Signed   By: Jeronimo Greaves M.D.   On: 06/01/2017 07:51   Mr Abdomen Mrcp Vivien Rossetti Contast  Result Date: 06/01/2017 CLINICAL DATA:  Cholelithiasis.  Common duct dilatation. EXAM: MRI ABDOMEN WITHOUT AND WITH CONTRAST (INCLUDING MRCP) TECHNIQUE: Multiplanar multisequence MR imaging of the abdomen was performed both before and after the administration of intravenous contrast. Heavily T2-weighted images of the biliary and pancreatic ducts were obtained, and three-dimensional MRCP images were rendered by post processing. CONTRAST:  20 cc of MultiHance. COMPARISON:  CT ultrasound of earlier in the day. FINDINGS: Mild to moderate motion degradation involves the precontrast imaging. The dynamic images are of relatively good quality. Lower chest: Mild cardiomegaly, without pericardial or pleural effusion. Hepatobiliary: No focal liver lesion. Borderline to mild intrahepatic biliary duct dilatation. The common duct is upper normal in size, including at 9 mm on image 61/series 4. No evidence of choledocholithiasis. Multiple gallstones within a distended gallbladder. Moderate gallbladder wall thickening and pericholecystic interstitial infiltration. Pancreas:  Normal, without mass or ductal dilatation. Spleen: 3.2 cm mildly septated cystic lesion within the subcapsular medial spleen, including on image 30/ series 16109. Adrenals/Urinary Tract: Normal adrenal glands. Normal left kidney. Right renal cysts. No  hydronephrosis. Stomach/Bowel: Normal stomach and abdominal bowel loops. Vascular/Lymphatic: Normal caliber of the aorta and branch vessels. No retroperitoneal or retrocrural adenopathy. Other:  No ascites. Musculoskeletal: No acute osseous abnormality. IMPRESSION: 1. Motion degraded exam. 2. Cholelithiasis with gallbladder wall thickening and pericholecystic interstitial thickening. Given absence of sonographic Murphy sign on ultrasound, findings are suspicious for chronic cholecystitis. 3. Borderline biliary duct dilatation, without evidence of choledocholithiasis. 4. 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 Presuming no left upper quadrant symptoms or fever, of doubtful clinical significance. Electronically Signed   By: Jeronimo Greaves M.D.   On: 06/01/2017 07:51   US Abdomen Limited Ruq  Result Date: 05/31/2017 CLINICAL DATA:  Acute onset of right upper quadrant abdominal pain. Initial encounter. EXAM: ULTRASOUND ABDOMEN LIMITED RIGHT UPPER QUADRANT COMPARISON:  CT of the abdomen and pelvis performed earlier today at 12:32 a.m. FINDINGS: Gallbladder: Stones and sludge are noted within the gallbladder. There is gallbladder wall thickening, measuring up to 4 mm. No pericholecystic fluid or ultrasonographic Murphy's sign is elicited. Common bile duct: Diameter: 1.2 cm, dilated for the patient's age. Liver: No focal lesion identified. Diffusely increased parenchymal echogenicity and coarsened echotexture, compatible with fatty infiltration. IMPRESSION: 1. Dilatation of the common bile duct to 1.2 cm in diameter, raising concern for distal obstruction. Would correlate with LFTs, and consider MRCP or ERCP for further evaluation, as deemed clinically appropriate. 2. Gallbladder wall thickening, with cholelithiasis and sludge in the gallbladder. This may reflect underlying obstruction, given the dilated common bile duct. No ultrasonographic Murphy's sign elicited. 3. Diffuse fatty infiltration within the  liver. Electronically Signed   By: Roanna Raider M.D.   On: 05/31/2017 03:18    Microbiology: Recent  Results (from the past 240 hour(s))  Urine culture     Status: Abnormal   Collection Time: 05/31/17 12:53 AM  Result Value Ref Range Status   Specimen Description URINE, CLEAN CATCH  Final   Special Requests NONE  Final   Culture >=100,000 COLONIES/mL ESCHERICHIA COLI (A)  Final   Report Status 06/02/2017 FINAL  Final   Organism ID, Bacteria ESCHERICHIA COLI (A)  Final      Susceptibility   Escherichia coli - MIC*    AMPICILLIN >=32 RESISTANT Resistant     CEFAZOLIN <=4 SENSITIVE Sensitive     CEFTRIAXONE <=1 SENSITIVE Sensitive     CIPROFLOXACIN <=0.25 SENSITIVE Sensitive     GENTAMICIN >=16 RESISTANT Resistant     IMIPENEM <=0.25 SENSITIVE Sensitive     NITROFURANTOIN <=16 SENSITIVE Sensitive     TRIMETH/SULFA <=20 SENSITIVE Sensitive     AMPICILLIN/SULBACTAM >=32 RESISTANT Resistant     PIP/TAZO <=4 SENSITIVE Sensitive     Extended ESBL NEGATIVE Sensitive     * >=100,000 COLONIES/mL ESCHERICHIA COLI     Labs: Basic Metabolic Panel:  Recent Labs Lab 06/02/17 0540 06/03/17 0521 06/04/17 0518 06/05/17 0538 06/06/17 0533  NA 140 141 140 143 145  K 4.7 4.4 4.0 3.6 3.6  CL 106 108 104 104 105  CO2 27 26 28 31 29   GLUCOSE 110* 100* 105* 101* 104*  BUN 16 21* 18 15 13   CREATININE 0.82 1.08* 0.83 0.77 0.73  CALCIUM 8.3* 8.6* 8.8* 8.7* 9.0   Liver Function Tests:  Recent Labs Lab 06/01/17 0450  AST 18  ALT 14  ALKPHOS 47  BILITOT 0.7  PROT 6.5  ALBUMIN 3.2*   No results for input(s): LIPASE, AMYLASE in the last 168 hours. No results for input(s): AMMONIA in the last 168 hours. CBC:  Recent Labs Lab 06/02/17 0605 06/03/17 0521 06/04/17 0518 06/05/17 0538 06/06/17 0533  WBC 18.2* 15.1* 10.3 8.6 8.4  NEUTROABS 14.7* 12.1* 7.5 5.5 5.7  HGB 11.5* 11.8* 10.9* 10.5* 11.1*  HCT 37.8 38.6 35.5* 33.7* 35.1*  MCV 96.4 97.2 95.7 94.9 92.6  PLT 169 168 191 191  232   Cardiac Enzymes: No results for input(s): CKTOTAL, CKMB, CKMBINDEX, TROPONINI in the last 168 hours. BNP: BNP (last 3 results) No results for input(s): BNP in the last 8760 hours.  ProBNP (last 3 results) No results for input(s): PROBNP in the last 8760 hours.  CBG: No results for input(s): GLUCAP in the last 168 hours.     SignedZannie Cove MD.  [facility] 06/07/2017, 12:54 PM

## 2017-06-30 ENCOUNTER — Telehealth: Payer: Self-pay | Admitting: Internal Medicine

## 2017-06-30 DIAGNOSIS — R918 Other nonspecific abnormal finding of lung field: Secondary | ICD-10-CM

## 2017-06-30 NOTE — Telephone Encounter (Signed)
Spoke with pt, aware of recs.  Order placed for follow up ct chest in 4 mos.  Nothing further needed at this time.

## 2017-06-30 NOTE — Telephone Encounter (Signed)
Called and spoke with pt and she stated that she had gallbladder surgery about 1 month ago and they told her that she had a pulmonary nodule on her lung from her CT scan.  She did follow up with her PCP who told her to discuss with CY.  The ct scan says to repeat in about 1 year.  Pt was calling to get recs from CY on waiting for a year of doing something at this time. CY please advise. Thanks

## 2017-06-30 NOTE — Telephone Encounter (Signed)
Especially since the CT scan only looked at part of her lung, I suggest we order future CT chest in 4 months, no contrast for dx lung nodule

## 2017-09-15 ENCOUNTER — Other Ambulatory Visit: Payer: Self-pay | Admitting: Family Medicine

## 2017-09-15 DIAGNOSIS — Z1231 Encounter for screening mammogram for malignant neoplasm of breast: Secondary | ICD-10-CM

## 2017-10-14 ENCOUNTER — Ambulatory Visit
Admission: RE | Admit: 2017-10-14 | Discharge: 2017-10-14 | Disposition: A | Discharge status: Home / Self Care | Payer: Medicare Other | Source: Ambulatory Visit | Attending: Family Medicine | Admitting: Family Medicine

## 2017-10-14 DIAGNOSIS — Z1231 Encounter for screening mammogram for malignant neoplasm of breast: Secondary | ICD-10-CM

## 2017-10-30 ENCOUNTER — Ambulatory Visit (INDEPENDENT_AMBULATORY_CARE_PROVIDER_SITE_OTHER)
Admission: RE | Admit: 2017-10-30 | Discharge: 2017-10-30 | Disposition: A | Discharge status: Home / Self Care | Payer: Medicare Other | Source: Ambulatory Visit | Attending: Internal Medicine | Admitting: Internal Medicine

## 2017-10-30 DIAGNOSIS — R918 Other nonspecific abnormal finding of lung field: Secondary | ICD-10-CM | POA: Diagnosis not present

## 2017-11-02 ENCOUNTER — Telehealth: Payer: Self-pay | Admitting: Internal Medicine

## 2017-11-02 NOTE — Telephone Encounter (Signed)
Notes recorded by Waymon Budge, MD on 10/30/2017 at 4:57 PM EST CT chest shows a stable lung nodule which can be considered benign. --------------- Pt aware of recs.  Pt is requesting to know if her spleen showed up on imaging.    CY Please advise.  Thanks.

## 2017-11-02 NOTE — Telephone Encounter (Signed)
Left message for patient to call back for results.  

## 2017-11-02 NOTE — Telephone Encounter (Signed)
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

## 2017-11-03 NOTE — Telephone Encounter (Signed)
Spoke with pt, aware of CY's recs.  Nothing further needed.  

## 2017-11-05 ENCOUNTER — Ambulatory Visit: Payer: Medicare Other | Admitting: Internal Medicine

## 2017-11-05 ENCOUNTER — Encounter: Payer: Self-pay | Admitting: Internal Medicine

## 2017-11-05 VITALS — BP 130/84 | HR 67 | Ht 69.0 in | Wt 254.0 lb

## 2017-11-05 DIAGNOSIS — I5022 Chronic systolic (congestive) heart failure: Secondary | ICD-10-CM | POA: Diagnosis not present

## 2017-11-05 DIAGNOSIS — I10 Essential (primary) hypertension: Secondary | ICD-10-CM | POA: Diagnosis not present

## 2017-11-05 DIAGNOSIS — E782 Mixed hyperlipidemia: Secondary | ICD-10-CM

## 2017-11-05 LAB — LIPID PANEL
CHOLESTEROL TOTAL: 198 mg/dL (ref 100–199)
Chol/HDL Ratio: 2.7 ratio (ref 0.0–4.4)
HDL: 73 mg/dL (ref >39)
LDL CALC: 109 mg/dL — AB (ref 0–99)
Triglycerides: 81 mg/dL (ref 0–149)
VLDL CHOLESTEROL CAL: 16 mg/dL (ref 5–40)

## 2017-11-05 NOTE — Progress Notes (Signed)
Cardiology Office Note   Date:  11/05/2017   ID:  Crystal Yoder, DOB [birth year], MRN [id number]  PCP:  Crystal Montana, MD  Cardiologist:   Dietrich Pates, MD   F/U of NICM     History of Present Illness: Crystal Yoder is a 72 y.o. female with a history of HTN NICM (LVEF 45 to 50%) GERD, HL, reactive airway dz  I saw her in Oct  Complained of more fatigue at that time   Echo was unchanged LVEF 45%  Seen in June  In Aug had choley  (open)  Recovery was slow  Breatihing is OK  Did have epigastirc discomfort   Improved  No signif edema   Current Meds  Medication Sig  . Ascorbic Acid (VITAMIN C) 1000 MG tablet Take 1,000 mg by mouth daily.  . Azelastine-Fluticasone 137-50 MCG/ACT SUSP Place 2 sprays into the nose as needed.  . carvedilol (COREG) 3.125 MG tablet take 1 tablet by mouth twice a day with meals  . cholecalciferol (VITAMIN D) 1000 units tablet Take 1,000 Units by mouth daily.  Marland Kitchen losartan (COZAAR) 50 MG tablet TAKE 1 TABLET BY MOUTH DAILY  . Multiple Vitamin (MULTIVITAMIN WITH MINERALS) TABS tablet Take 1 tablet by mouth daily.  . pantoprazole (PROTONIX) 40 MG tablet Take 40 mg by mouth daily.      Allergies:   Penicillins; Sulfa antibiotics; and Sulfonamide derivatives   Past Medical History:  Diagnosis Date  . Arthritis   . Arthropathy, unspecified, site unspecified   . COPD (chronic obstructive pulmonary disease) (HCC)   . Esophageal reflux   . Essential hypertension 05/31/2017  . Obesity, unspecified   . Obstructive sleep apnea (adult) (pediatric)   . Other acute sinusitis   . Other primary cardiomyopathies   . Psoriasis   . Shortness of breath   . Unspecified venous (peripheral) insufficiency     Past Surgical History:  Procedure Laterality Date  . ABDOMINAL HYSTERECTOMY  2011  . BLADDER SURGERY  2011   tac=ant/post  . CHOLECYSTECTOMY N/A 06/01/2017   Procedure: LAPAROSCOPIC CHOLECYSTECTOMY;  Surgeon: Gaynelle Adu, MD;  Location: [facility];  Service:  General;  Laterality: N/A;  . IRRIGATION AND DEBRIDEMENT OF WOUND WITH SPLIT THICKNESS SKIN GRAFT Right 01/25/2014   Procedure: RIGHT FOOT IRRIGATION AND DEBRIDEMENT WITH ACELL/SPLICK THICKNESS SKINGRAFT WITH VAC;  Surgeon: Wayland Denis, DO;  Location:  [facility]ERY [facility];  Service: Plastics;  Laterality: Right;  . PELVIC FLOOR REPAIR    . REPLACEMENT TOTAL KNEE     rt and lt  . 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 Her smoking use included cigarettes. She has a 5.00 pack-year smoking history. she has never used smokeless tobacco. She reports that she drinks alcohol. She reports that she does not use drugs.   Family History:  The patient's family history includes Diabetes in her paternal grandfather; Diverticulitis in her father; Prostate cancer in her father.    ROS:  Please see the history of present illness. All other systems are reviewed and  Negative to the above problem except as noted.    PHYSICAL EXAM: VS:  BP 130/84   Pulse 67   Ht 5\' 9"  (1.753 m)   Wt 254 lb (115.2 kg)   SpO2 95%   BMI 37.51 kg/m   GEN: Morbidly obese 71 yo , in no acute distress  HEENT: normal  Neck: no JVD, carotid bruits, or masses Cardiac: RRR; no murmurs, rubs, or gallops,no edema  Respiratory:  clear to auscultation bilaterally, normal work of breathing GI: soft, nontender, nondistended, + BS  No hepatomegaly  MS: no deformity Moving all extremities   Skin: warm and dry, no rash Neuro:  Strength and sensation are intact Psych: euthymic mood, full affect   EKG:  EKG is ordered today.SR 69 bpm  LVH w repol abnormalitiy     Lipid Panel    Component Value Date/Time   CHOL 201 (H) 10/31/2013 1440   TRIG 42.0 10/31/2013 1440   TRIG 75 10/23/2006 0812   HDL 67.80 10/31/2013 1440   CHOLHDL 3 10/31/2013 1440   VLDL 8.4 10/31/2013 1440   LDLCALC 98 03/02/2012 1010   LDLDIRECT 121.5 10/31/2013 1440       Wt Readings from Last 3 Encounters:  11/05/17 254 lb (115.2 kg)  06/01/17 265 lb (120.2 kg)  03/27/17 265 lb 12.8 oz (120.6 kg)      ASSESSMENT AND PLAN:  1  NICM  Mild LV dysfunciton on echo  VOlume status is OK  2  HTN  BP is OK  Keep on same regimen.  3  Pulmonary  Follows in pulmonary clinic   4  HL   Pt with mild plaquing of aorta  Get lipids    Should be on satin  F/U in 6 months     Current medicines are reviewed at length with the patient today.  The patient does not have concerns regarding medicines.  Signed, Dietrich Pates, MD  11/05/2017 12:10 PM    [facility] [address] [place], [place], [place]  [address] Phone: [phone]; Fax: [phone]      Cardiology Office Note   Date:  11/05/2017   ID:  Crystal Yoder, Crystal Yoder September 12, 1946, MRN [id number]  PCP:  Crystal Montana, MD  Cardiologist:   Dietrich Pates, MD   F/U of NICM     History of Present Illness: Crystal Yoder is a 72 y.o. female with a history of HTN NICM (LVEF 45 to 50%) GERD, HL, reactive airway dz  I saw her in Oct  Complained of more fatigue at that time   Echo was unchanged LVEF 45%  Since seen the pt says her breathing is stable  Denies CP  No edema   Pt does have dry cough  She is under signif increased stress  Electricity is turned off in home  Home may be foreclosed on    Current Meds  Medication Sig  . Ascorbic Acid (VITAMIN C) 1000 MG tablet Take 1,000 mg by mouth daily.  . Azelastine-Fluticasone 137-50 MCG/ACT SUSP Place 2 sprays into the nose as needed.  . carvedilol (COREG) 3.125 MG tablet take 1 tablet by mouth twice a day with meals  . cholecalciferol (VITAMIN D) 1000 units tablet Take 1,000 Units by mouth daily.  Marland Kitchen losartan (COZAAR) 50 MG tablet TAKE 1 TABLET BY MOUTH DAILY  . Multiple Vitamin (MULTIVITAMIN WITH MINERALS) TABS tablet Take 1 tablet by mouth daily.  . pantoprazole (PROTONIX) 40 MG tablet Take 40 mg by mouth daily.      Allergies:    Penicillins; Sulfa antibiotics; and Sulfonamide derivatives   Past Medical History:  Diagnosis Date  . Arthritis   . Arthropathy, unspecified, site unspecified   . COPD (chronic obstructive pulmonary disease) (HCC)   . Esophageal reflux   . Essential hypertension 05/31/2017  . Obesity, unspecified   .  Obstructive sleep apnea (adult) (pediatric)   . Other acute sinusitis   . Other primary cardiomyopathies   . Psoriasis   . Shortness of breath   . Unspecified venous (peripheral) insufficiency     Past Surgical History:  Procedure Laterality Date  . ABDOMINAL HYSTERECTOMY  2011  . BLADDER SURGERY  2011   tac=ant/post  . CHOLECYSTECTOMY N/A 06/01/2017   Procedure: LAPAROSCOPIC CHOLECYSTECTOMY;  Surgeon: Gaynelle Adu, MD;  Location: [facility];  Service: General;  Laterality: N/A;  . IRRIGATION AND DEBRIDEMENT OF WOUND WITH SPLIT THICKNESS SKIN GRAFT Right 01/25/2014   Procedure: RIGHT FOOT IRRIGATION AND DEBRIDEMENT WITH ACELL/SPLICK THICKNESS SKINGRAFT WITH VAC;  Surgeon: Wayland Denis, DO;  Location: [facility];  Service: Plastics;  Laterality: Right;  . PELVIC FLOOR REPAIR    . REPLACEMENT TOTAL KNEE     rt and lt  . 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 Her smoking use included cigarettes. She has a 5.00 pack-year smoking history. she has never used smokeless tobacco. She reports that she drinks alcohol. She reports that she does not use drugs.   Family History:  The patient's family history includes Diabetes in her paternal grandfather; Diverticulitis in her father; Prostate cancer in her father.    ROS:  Please see the history of present illness. All other systems are reviewed and  Negative to the above problem except as noted.    PHYSICAL EXAM: VS:  BP 130/84   Pulse 67   Ht 5\' 9"  (1.753 m)   Wt 254 lb (115.2 kg)   SpO2 95%   BMI 37.51 kg/m   GEN: Well nourished, well  developed, in no acute distress  HEENT: normal  Neck: no JVD, carotid bruits, or masses Cardiac: RRR; no murmurs, rubs, or gallops,no edema  Respiratory:  clear to auscultation bilaterally, normal work of breathing GI: soft, nontender, nondistended, + BS  No hepatomegaly  MS: no deformity Moving all extremities   Skin: warm and dry, no rash Neuro:  Strength and sensation are intact Psych: euthymic mood, full affect   EKG:  EKG is ordered today.SR 69 bpm  LVH w repol abnormalitiy     Lipid Panel    Component Value Date/Time   CHOL 201 (H) 10/31/2013 1440   TRIG 42.0 10/31/2013 1440   TRIG 75 10/23/2006 0812   HDL 67.80 10/31/2013 1440   CHOLHDL 3 10/31/2013 1440   VLDL 8.4 10/31/2013 1440   LDLCALC 98 03/02/2012 1010   LDLDIRECT 121.5 10/31/2013 1440      Wt Readings from Last 3 Encounters:  11/05/17 254 lb (115.2 kg)  06/01/17 265 lb (120.2 kg)  03/27/17 265 lb 12.8 oz (120.6 kg)      ASSESSMENT AND PLAN:  1  NICM  Mild LV dysfunciton on echo  I would keep on current regimen   2  HTN  BP is OK  Keep on same regimen.  3  Pulmonary  Follows with C Young  Pt concerned about possibilites of throat cancer I have told her her symptoms are nonspecific  May be related to allergies  Ultimately should discuss with C Young.    4  Psychosocial  Pt under signif increased stress today  COmes in with husband  Very angry about situation of home, loss of home  Bitter  I recomm counselling  Given name for D Gutterman  F/U  in clinic next winter      Current medicines are reviewed at length with the patient today.  The patient does not have concerns regarding medicines.  Signed, Dietrich Pates, MD  11/05/2017 12:10 PM    [facility] [address] [place], [place], [place]  [address] Phone: [phone]; Fax: [phone]

## 2017-11-05 NOTE — Patient Instructions (Signed)
Your physician recommends that you continue on your current medications as directed. Please refer to the Current Medication list given to you today.  Your physician recommends that you return for lab work today --LIPIDS  Your physician wants you to follow-up in: 6 MONTHS WITH DR. Tenny Craw. You will receive a reminder letter in the mail two months in advance. If you don't receive a letter, please call our office to schedule the follow-up appointment.

## 2017-12-17 ENCOUNTER — Telehealth: Payer: Self-pay | Admitting: Internal Medicine

## 2017-12-17 NOTE — Telephone Encounter (Signed)
Patient wanted to let Dr. Tenny Craw know that she is researching Crestor and just is not ready to start taking because she has concerns about the potential side effects.  She is researching natural remedies currently and so wants to hold off for now.  She is aware I will let Dr. Tenny Craw know.

## 2017-12-17 NOTE — Telephone Encounter (Signed)
New Message  Pt c/o medication issue:  1. Name of Medication: Crestor  2. How are you currently taking this medication (dosage and times per day)? 10mg   3. Are you having a reaction (difficulty breathing--STAT)? no  4. What is your medication issue? Not sure she wants to take it and she wants to discuss with nurse

## 2018-02-15 ENCOUNTER — Other Ambulatory Visit: Payer: Self-pay | Admitting: Internal Medicine

## 2018-05-13 ENCOUNTER — Other Ambulatory Visit: Payer: Self-pay | Admitting: Internal Medicine

## 2018-05-30 NOTE — Progress Notes (Signed)
Cardiology Office Note   Date:  05/31/2018   ID:  Crystal, Yoder 06-25-1946, MRN [id number]  PCP:  Crystal Montana, MD  Cardiologist:   Dietrich Pates, MD   F/U of NICM     History of Present Illness: Crystal Yoder is a 72 y.o. female with a history of HTN NICM (LVEF 45 to 50%) GERD, HL, reactive airway dz  I saw her in Oct  Complained of more fatigue at that time   Echo was unchanged LVEF 45% I saw her in Jan 2019   She was under signif increased stress (financial, home related) at that time    She comes in today with her husband   She remains under increased stress due to financial/housing issues  Breathing is fair   She does say she get SOB with bending    Denies CP   Denies palpitaitons    DOes have occasional LE edema    Current Meds  Medication Sig  . Ascorbic Acid (VITAMIN C) 1000 MG tablet Take 1,000 mg by mouth daily.  . Azelastine-Fluticasone 137-50 MCG/ACT SUSP Place 2 sprays into the nose as needed.  . carvedilol (COREG) 3.125 MG tablet TAKE 1 TABLET BY MOUTH TWICE DAILY WITH MEALS  . cholecalciferol (VITAMIN D) 1000 units tablet Take 1,000 Units by mouth daily.  Marland Kitchen losartan (COZAAR) 50 MG tablet TAKE 1 TABLET BY MOUTH DAILY  . Multiple Vitamin (MULTIVITAMIN WITH MINERALS) TABS tablet Take 1 tablet by mouth daily.  . pantoprazole (PROTONIX) 40 MG tablet Take 40 mg by mouth daily.      Allergies:   Penicillins; Sulfa antibiotics; and Sulfonamide derivatives   Past Medical History:  Diagnosis Date  . Arthritis   . Arthropathy, unspecified, site unspecified   . COPD (chronic obstructive pulmonary disease) (HCC)   . Esophageal reflux   . Essential hypertension 05/31/2017  . Obesity, unspecified   . Obstructive sleep apnea (adult) (pediatric)   . Other acute sinusitis   . Other primary cardiomyopathies   . Psoriasis   . Shortness of breath   . Unspecified venous (peripheral) insufficiency     Past Surgical History:  Procedure Laterality Date  . ABDOMINAL  HYSTERECTOMY  2011  . BLADDER SURGERY  2011   tac=ant/post  . CHOLECYSTECTOMY N/A 06/01/2017   Procedure: LAPAROSCOPIC CHOLECYSTECTOMY;  Surgeon: Gaynelle Adu, MD;  Location: [facility];  Service: General;  Laterality: N/A;  . IRRIGATION AND DEBRIDEMENT OF WOUND WITH SPLIT THICKNESS SKIN GRAFT Right 01/25/2014   Procedure: RIGHT FOOT IRRIGATION AND DEBRIDEMENT WITH ACELL/SPLICK THICKNESS SKINGRAFT WITH VAC;  Surgeon: Wayland Denis, DO;  Location: [facility];  Service: Plastics;  Laterality: Right;  . PELVIC FLOOR REPAIR    . REPLACEMENT TOTAL KNEE     rt and lt  . 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 Her smoking use included cigarettes. She has a 5.00 pack-year smoking history. She has never used smokeless tobacco. She reports that she drinks alcohol. She reports that she does not use drugs.   Family History:  The patient's family history includes Diabetes in her paternal grandfather; Diverticulitis in her father; Prostate cancer in her father.    ROS:  Please see the history of present illness. All other systems are reviewed and  Negative to the above problem except as noted.    PHYSICAL EXAM: VS:  BP 132/84 (BP Location: Right Arm, Patient Position: Sitting, Cuff Size: Large)   Pulse 71   Ht 5\' 9"  (1.753 m)   Wt 253 lb (114.8 kg)   BMI 37.36 kg/m   GEN: Morbidly obese 71 yo in no acute distress  HEENT: normal  Neck: no JVD, carotid bruits, or masses Cardiac: RRR; no murmurs, rubs, or gallops,Tr  edema  Respiratory:  clear to auscultation bilaterally, normal work of breathing GI: soft, nontender, nondistended, + BS  No hepatomegaly  MS: no deformity Moving all extremities   Skin: Chronic skin changes   Neuro:  Strength and sensation are intact Psych: euthymic mood, full affect   EKG:  EKG is  ordered    SR 71 bpm   LVH with QRS widening   ST T Wave changes consistent with strain  pattern   Lipid Panel    Component Value Date/Time   CHOL 198 11/05/2017 1220   TRIG 81 11/05/2017 1220   TRIG 75 10/23/2006 0812   HDL 73 11/05/2017 1220   CHOLHDL 2.7 11/05/2017 1220   CHOLHDL 3 10/31/2013 1440   VLDL 8.4 10/31/2013 1440   LDLCALC 109 (H) 11/05/2017 1220   LDLDIRECT 121.5 10/31/2013 1440      Wt Readings from Last 3 Encounters:  05/31/18 253 lb (114.8 kg)  11/05/17 254 lb (115.2 kg)  06/01/17 265 lb (120.2 kg)      ASSESSMENT AND PLAN:  1  NICM  Mild LV dysfunciton on echo  She complains of some "bendopnea"   WIll check labs   Volume does not appear to be too bad but may feel better with occasional lasix  2  HTN  BP is OK  Keep on same regimen.  3  Lipids   LDL 109 in Jan   Plauqing of aorta noted on CT of abdomen   I have recomm Crestor again to patient   She is reluctant    Would lkie to try diet though she admits that she has not been following a good diet  4 Pulmonary  Follows with C Young Has appt in next several day  5  Psychosocial  Pt with continued economic stressors   Angry.  Tearful during appt  I encouraged her to seek out help    REcomm D Gutterman      I also encouraged her to try some conditioning exercises or water activity   May help with stress as well as other medical issues    I am not optimstic about pt looking into this      Plan for f/u next winter   Current medicines are reviewed at length with the patient today.  The patient does not have concerns regarding medicines.  Signed, Dietrich Pates, MD  05/31/2018 2:56 PM    [facility] [address] [place], [place], [place]  [address] Phone: [phone]; Fax: [phone]

## 2018-05-31 ENCOUNTER — Encounter: Payer: Self-pay | Admitting: Internal Medicine

## 2018-05-31 ENCOUNTER — Ambulatory Visit: Payer: Medicare Other | Admitting: Internal Medicine

## 2018-05-31 VITALS — BP 132/84 | HR 71 | Ht 69.0 in | Wt 253.0 lb

## 2018-05-31 DIAGNOSIS — I10 Essential (primary) hypertension: Secondary | ICD-10-CM | POA: Diagnosis not present

## 2018-05-31 DIAGNOSIS — I5022 Chronic systolic (congestive) heart failure: Secondary | ICD-10-CM | POA: Diagnosis not present

## 2018-05-31 DIAGNOSIS — I700 Atherosclerosis of aorta: Secondary | ICD-10-CM | POA: Diagnosis not present

## 2018-05-31 DIAGNOSIS — E782 Mixed hyperlipidemia: Secondary | ICD-10-CM | POA: Diagnosis not present

## 2018-05-31 DIAGNOSIS — F419 Anxiety disorder, unspecified: Secondary | ICD-10-CM

## 2018-05-31 NOTE — Patient Instructions (Addendum)
Your physician recommends that you continue on your current medications as directed. Please refer to the Current Medication list given to you today.  Your physician recommends that you return for lab work now: bmet, bnp

## 2018-06-01 ENCOUNTER — Telehealth: Payer: Self-pay

## 2018-06-01 LAB — BASIC METABOLIC PANEL
BUN / CREAT RATIO: 19 (ref 12–28)
BUN: 20 mg/dL (ref 8–27)
CALCIUM: 9 mg/dL (ref 8.7–10.3)
CO2: 25 mmol/L (ref 20–29)
Chloride: 104 mmol/L (ref 96–106)
Creatinine, Ser: 1.04 mg/dL — ABNORMAL HIGH (ref 0.57–1.00)
GFR, EST AFRICAN AMERICAN: 62 mL/min/{1.73_m2} (ref >59)
GFR, EST NON AFRICAN AMERICAN: 54 mL/min/{1.73_m2} — AB (ref >59)
Glucose: 87 mg/dL (ref 65–99)
POTASSIUM: 5.1 mmol/L (ref 3.5–5.2)
SODIUM: 144 mmol/L (ref 134–144)

## 2018-06-01 LAB — PRO B NATRIURETIC PEPTIDE: NT-Pro BNP: 3419 pg/mL — ABNORMAL HIGH (ref 0–301)

## 2018-06-01 NOTE — Telephone Encounter (Signed)
-----   Message from Dietrich Pates V, MD sent at 06/01/2018  1:01 PM EDT ----- Fluid is increasedl Kidney function is normal as are electrolytes REcomm: 1  Would add lasix 40 mg to regimen with 20 KCL 2.   Would switch cozaar for Entresto 24/26 bid  May help ease work of heart 3   F/U in 10 days for BMET / BNP as well as BP check

## 2018-06-01 NOTE — Telephone Encounter (Signed)
Notes recorded by Sigurd Sos, RN on 06/01/2018 at 1:28 PM EDT Mailbox full, no answer at home ------

## 2018-06-02 ENCOUNTER — Other Ambulatory Visit: Payer: Self-pay

## 2018-06-02 DIAGNOSIS — Z79899 Other long term (current) drug therapy: Secondary | ICD-10-CM

## 2018-06-02 MED ORDER — POTASSIUM CHLORIDE CRYS ER 20 MEQ PO TBCR: 20.0000 meq | EXTENDED_RELEASE_TABLET | Freq: Every day | ORAL | 3 refills | Status: DC

## 2018-06-02 MED ORDER — SACUBITRIL-VALSARTAN 24-26 MG PO TABS: 1.0000 | ORAL_TABLET | Freq: Two times a day (BID) | ORAL | 3 refills | Status: DC

## 2018-06-02 MED ORDER — FUROSEMIDE 40 MG PO TABS: 40.0000 mg | ORAL_TABLET | Freq: Every day | ORAL | 3 refills | Status: DC

## 2018-06-02 NOTE — Telephone Encounter (Signed)
Informed patient of results/recommendations.  She verbalized understanding.

## 2018-06-03 ENCOUNTER — Encounter: Payer: Self-pay | Admitting: Internal Medicine

## 2018-06-03 ENCOUNTER — Telehealth: Payer: Self-pay | Admitting: Internal Medicine

## 2018-06-03 ENCOUNTER — Ambulatory Visit: Payer: Medicare Other | Admitting: Internal Medicine

## 2018-06-03 DIAGNOSIS — G4733 Obstructive sleep apnea (adult) (pediatric): Secondary | ICD-10-CM | POA: Diagnosis not present

## 2018-06-03 DIAGNOSIS — J410 Simple chronic bronchitis: Secondary | ICD-10-CM | POA: Diagnosis not present

## 2018-06-03 DIAGNOSIS — I428 Other cardiomyopathies: Secondary | ICD-10-CM | POA: Diagnosis not present

## 2018-06-03 NOTE — Telephone Encounter (Signed)
Patient asked several questions about medication changes. Reviewed that she is to take lasix 40 mg and potassium 20 meq once a day and that it will cause increased urination so take in the morning unless she is going out in the morning then wait till she gets back home. Advised to take Entresto 2 times every day, about 12 hours apart. Advised ok to take same time as carvedilol and ok to take with or without food.  Scheduled appointment for nurse visit for 06/15/18, and made lab appointment for same day (BMET, BNP)  Pt aware to stop losartan due to starting Entresto.  Pt states she will start with these changes tomorrow, since she wasn't sure what to do until so late today.

## 2018-06-03 NOTE — Telephone Encounter (Signed)
Follow up:  Patient calling back, because she doesn't know if she needed to keep taking her hart medicine. Voice mail not set up for the main line. Patient stated she didn't understand result. Patient has a appointment today she will not be back home until after 4 pm.

## 2018-06-03 NOTE — Patient Instructions (Addendum)
Please call if we can help 

## 2018-06-03 NOTE — Progress Notes (Signed)
Patient ID: Crystal Yoder, female    DOB: 1946-07-31, 72 y.o.   MRN: [id number]   HPI F former smoker followed for OSA/ failed CPAP, allergic rhinitis, COPD/ chronic cough,  complicated by hx of GERD, peripheral venous insufficiency, OHS, Asplenia, CM/CHF, psoriasis NPSG 05/03/00 [place]- AHI 20.9, incomplete control on BIPAP 13/7, body weight 237 lbs PFT 2012-mild obstruction small airways, DLCO 54% Echocardiogram 08/27/2016-EF 40%-45%, , gr 1 diastolic dysfunction, chamber dilatation, PA peak 47 mmHg FENO 08/29/16  6- WNL ----------------------------------------------------------------------------------- 05/28/17- 72 year old female former smoker followed for COPD/chronic cough, allergic rhinitis, OSA/failed CPAP, complicated by GERD, peripheral venous insufficiency, obesity, OHS, Asplenia, CM/CHF, psoriasis ACUTE VISIT:Pt states she continues to have congestion and cough-slightly better since June but stays fatigued. Pt was having hoarsness, fullness in ears, and sneezing. Also has clammy feelings and real low grade chills.  Onset cough and malaise in late June treated by [facility] with Z-Pak. Some mild headache not aggravated by leaning over and no nasal discharge. Some sense of postnasal drip. ENT evaluation/Dr. Ezzard Standing for hoarseness-treated with Flonase. Going to beach soon and hopes that atmosphere will be better for her than all the rain we have been having here.  06/03/2018- 72 year old female former smoker followed for COPD/chronic cough, allergic rhinitis, OSA/failed CPAP, complicated by GERD, peripheral venous insufficiency, obesity, OHS, Asplenia, CM/CHF/ diast dysfunction, psoriasis -----1 yr f/u for OSA and possible allergies.  Patient refused to be weighed. Flonase A little wheeze only occasionally, especially while lying down.  It does not wake her and "not bad". Had cholecystectomy last year and denies respiratory problems with that. Followed by her cardiologist with recent blood  pressure med changes. BNP 3 days ago was 3,419 - pending f/u. Husband tells her she no longer snores or has witnessed apneas.  We discussed oral appliances a therapeutic option if the symptoms return. Has had pneumonia vaccines at her PCP office. CT chest 10/30/2017 IMPRESSION: Stable right middle lobe pulmonary nodule, consistent with benign etiology. No active disease within the thorax. Aortic Atherosclerosis (ICD10-I70.0) and Emphysema (ICD10-J43.9).  Review of Systems-see HPI    + = positive Constitutional:   No-   weight loss, night sweats, fevers, chills, + fatigue, lassitude. HEENT:   No-   headaches, difficulty swallowing, tooth/dental problems, sore throat,                  Little-   sneezing, itching, ear ache, +nasal congestion, post nasal drip,  CV:  No-   chest pain, orthopnea, PND, swelling in lower extremities, anasarca, dizziness, palpitations GI:  No-   heartburn, indigestion, abdominal pain, nausea, vomiting, diarrhea,                 change in bowel habits, loss of appetite Resp: + shortness of breath with exertion or at rest.  No-  excess mucus,             No-  coughing up of blood.  + Persistent dry cough           change in color of mucus.  No- wheezing.   Skin: No-   rash or lesions. GU: No-   dysuria,  MS:  No-acute  joint pain or swelling. Psych:  No- change in mood or affect. + depression or anxiety.  No memory loss.  Objective:   Physical Exam  General- Alert, Oriented, Affect-appropriate, Distress- none acute    + Obese,  Skin- rash-none, lesions- none, excoriation- none Lymphadenopathy- none Head- atraumatic  Eyes- Gross vision intact, PERRLA, conjunctivae clear secretions            Ears- Hearing, canals + cerumen bilaterally            Nose- +sounds stuffy, No-Septal dev, mucus, polyps, erosion, perforation             Throat- Mallampati III-IV , mucosa clear , drainage- none, tonsils- atrophic. + horse Neck- flexible , trachea midline, no  stridor , thyroid nl, carotid no bruit Chest - symmetrical excursion , unlabored           Heart/CV- RRR , no murmur , no gallop  , no rub, nl s1 s2                           - JVD- none , edema+ trace, stasis changes- none, varices- none           Lung-  Wheeze-None, cough-none; no cough with deep breath , dullness-none, rub- none           Chest wall-  Abd-  Br/ Gen/ Rectal- Not done, not indicated Extrem- cyanosis- none, clubbing, none, atrophy- none, strength- nl.  Superficial varices.  Neuro- grossly intact to observation

## 2018-06-04 NOTE — Assessment & Plan Note (Signed)
Recent BNP 3,419. She is followed by cardiology and I asked her to be in touch with them before her symptoms catch up with her.

## 2018-06-04 NOTE — Assessment & Plan Note (Signed)
By her account, she is not symptomatic and her husband tells her she is not snoring or having witnessed apneas.  She has not lost significant weight.  I discussed a fitted oral appliance is a very reasonable option, encouraging her to pay attention and to seek help if her status changes.

## 2018-06-04 NOTE — Assessment & Plan Note (Signed)
She does not have an active bronchitis now.  I would be more concerned about possible decompensated CHF.  We will address as needed.

## 2018-06-15 ENCOUNTER — Ambulatory Visit (INDEPENDENT_AMBULATORY_CARE_PROVIDER_SITE_OTHER): Payer: Medicare Other

## 2018-06-15 ENCOUNTER — Other Ambulatory Visit: Payer: Medicare Other

## 2018-06-15 VITALS — BP 104/62 | HR 77 | Ht 68.0 in | Wt 242.1 lb

## 2018-06-15 DIAGNOSIS — I5022 Chronic systolic (congestive) heart failure: Secondary | ICD-10-CM

## 2018-06-15 DIAGNOSIS — Z79899 Other long term (current) drug therapy: Secondary | ICD-10-CM

## 2018-06-15 NOTE — Progress Notes (Signed)
Agree with changes I am in clinic on Fri   Would recomm adding pt on if still having difficulty If continues to feel week, hold Entresto

## 2018-06-15 NOTE — Patient Instructions (Signed)
Medication Instructions:  Tonight: Take your Carvedilol, skip your entresto  Tomorrow: Take 20 mg of Lasix, half a tablet, by mouth and 10 mg of Potassium, half a tablet, by mouth, Take regular dose of entresto, take Carvedilol as normal.  Labwork: We will call you back with the results of your lab tests  If you need a refill on your cardiac medications before your next appointment, please call your pharmacy.

## 2018-06-15 NOTE — Progress Notes (Signed)
1.) Reason for visit: Blood pressure check  2.) Name of MD requesting visit: Dr. Tenny Craw  3.) H&P: HTN, NICM, GERD, SOB  4.) ROS related to problem: Patient is short of breath and is very fatigued.  5.) Assessment and plan per MD: The patient had SOB, that is worse after her medication change to entresto and lasix 40 mg. The patient was very fatigued and would have instructions repeated multiple times. She has trouble with position changes and requires a brief rest period before ambulating. The patient's blood pressure was very difficult to read, the automatic blood pressure was not able to read. After multiple attempts, 104/62. Another nurse got, 96/60. Used a doppler to confirm placement. The patient's pulse was very difficult to assess and radial pulse was 44 HR, We performed an EKG. The EKG had frequent PVCs, with sinus rhythm and HR at 77. Per Dr. Katrinka Blazing (DOD), the patient has frequent PVCs and she should decrease her Lasix to 20 mg and Potassium to 10 mg. She should also hold her entresto tonight and start the medication again tomorrow. The patient should continue to take Lasix and Potassium at the lower dose, until the lab results are back. The patient accepted the changes.

## 2018-06-16 ENCOUNTER — Other Ambulatory Visit: Payer: Self-pay | Admitting: Family Medicine

## 2018-06-16 ENCOUNTER — Ambulatory Visit
Admission: RE | Admit: 2018-06-16 | Discharge: 2018-06-16 | Disposition: A | Discharge status: Home / Self Care | Payer: Medicare Other | Source: Ambulatory Visit | Attending: Family Medicine | Admitting: Family Medicine

## 2018-06-16 ENCOUNTER — Telehealth: Payer: Self-pay | Admitting: Internal Medicine

## 2018-06-16 DIAGNOSIS — I5022 Chronic systolic (congestive) heart failure: Secondary | ICD-10-CM

## 2018-06-16 DIAGNOSIS — R05 Cough: Secondary | ICD-10-CM

## 2018-06-16 DIAGNOSIS — R059 Cough, unspecified: Secondary | ICD-10-CM

## 2018-06-16 LAB — BASIC METABOLIC PANEL
BUN/Creatinine Ratio: 27 (ref 12–28)
BUN: 35 mg/dL — AB (ref 8–27)
CALCIUM: 9.4 mg/dL (ref 8.7–10.3)
CO2: 25 mmol/L (ref 20–29)
CREATININE: 1.31 mg/dL — AB (ref 0.57–1.00)
Chloride: 101 mmol/L (ref 96–106)
GFR calc Af Amer: 47 mL/min/{1.73_m2} — ABNORMAL LOW (ref >59)
GFR, EST NON AFRICAN AMERICAN: 41 mL/min/{1.73_m2} — AB (ref >59)
Glucose: 99 mg/dL (ref 65–99)
Potassium: 5.2 mmol/L (ref 3.5–5.2)
Sodium: 143 mmol/L (ref 134–144)

## 2018-06-16 LAB — PRO B NATRIURETIC PEPTIDE: NT-Pro BNP: 465 pg/mL — ABNORMAL HIGH (ref 0–301)

## 2018-06-16 NOTE — Telephone Encounter (Addendum)
-----   Message from Pricilla Riffle, MD sent at 06/15/2018  8:59 PM EDT ----- Pricilla Riffle, MD at 06/15/2018 2:00 PM   Status: Signed   Agree with changes I am in clinic on Fri   Would recomm adding pt on if still having difficulty If continues to feel week, hold Entresto    ___________________________________________________________________________  Saw Dr. Tiburcio Pea (PCP).  Had CXR at [facility] Pulse sitting 60.101/69 BP sitting O2 95% She was instructed not to take carvedilol today.   She has not been drinking much.  Encouraged her to stay hydrated. I advised that if she continues to be weak that Dr. Tenny Craw wants to hold [facility].  Advised she can be added on to clinic on 8/23.  Pt stated several times "I guess this has come on gradually, I have been crying a lot and it adds to my congestion.  I have so many things psychologically and physically going on.  I just can't even think straight."

## 2018-06-16 NOTE — Telephone Encounter (Signed)
New Message:   Patient calling because she has some concern about medication changes. Entresto 24/26 mg, patient having a bad cough and shortness of breat.

## 2018-06-16 NOTE — Telephone Encounter (Signed)
Pt called to report that she has been experiencing a productive cough, nasal congestion, and feels clammy but not sure what her temp is. She says she was recently at the beach and has developed a blister in her nose which has happened to her before and needed a "special" med to treat it. She denies shortness of breath but says it hurts to take a deep breath and when she coughs. I advised her to call Dr. Cliffton Asters her PMD to be seen as soon as possible. I advised her that she may ned a CXR for potential pneumonia. The nurse that saw her yesterday for her BP check said she appeared very fatigued. She says she has been increasingly getting lethargic as she developed the cold symptoms. Advised her to try and be seen by her PMD today, and that I will forward her message to Dr. Tenny Craw and let her know if she would like to make any further changes after reviewing her labs. Pt agrees with plan and will let me know if she has any difficulty getting in with her PMD today.

## 2018-06-17 ENCOUNTER — Telehealth: Payer: Self-pay | Admitting: Internal Medicine

## 2018-06-17 MED ORDER — POTASSIUM CHLORIDE ER 10 MEQ PO TBCR: 10.0000 meq | EXTENDED_RELEASE_TABLET | ORAL | 3 refills | Status: DC

## 2018-06-17 MED ORDER — FUROSEMIDE 40 MG PO TABS: 20.0000 mg | ORAL_TABLET | ORAL | 3 refills | Status: DC

## 2018-06-17 NOTE — Telephone Encounter (Signed)
Home number connected to fax machine. Called mobile number.  Mailbox is full and cannot accept any messages at this time.

## 2018-06-17 NOTE — Telephone Encounter (Signed)
Notes recorded by Pricilla Riffle, MD on 06/17/2018 at 8:24 AM EDT Fluid number is signif improved from 2 wks ago Cr is increased mildly She may not need daily lasix    1  REview BP on Entresto 2  How breathing 3  REcomm pulling back on lasix to every other day     F/U BMET /BNP in another 2 wks  Tried to call pt but no answer   _________________________  Reviewed with patient. She will continue lasix 20 and potassium 10 meq (as was recommended by Dr. Katrinka Blazing at nurse visit/bp check)--but wll decrease to every other day. Will come for lab work on 07/01/18.

## 2018-06-17 NOTE — Telephone Encounter (Signed)
Cardiology Moonlighter Note  Was coming home from store today and decided to stop at [facility] to get blood pressure and heart rate checked. Systolic BP was 110. HR 36 bpm. No dizziness, lightheadedness. Does feel fatigued, but thinks this is because she didn't sleep well last night and has had a very stressful day. Taking all of her medications as scheduled, including carvedilol 3.125mg  BID. Had not yet taken her PM dose of carvedilol. She has been taking this dose of carvedilol for many years and has had no troubles with heart rates.   I reassured the patient that if she is truly asymptomatic from her bradycardia then it would be reasonable to remain on carvedilol. She has a history of heart failure, although her LVEF is borderline (40-45%) rather than truly "reduced" LVEF, thus the benefit of carvedilol in her is unclear. I told her that her cardiologist may recommended ambulatory monitoring to evaluate her bradycardia burden prior to deciding whether she should come off carvedilol. A copy of this note will be sent to Dr. Tenny Craw.  Crystal Jacks, MD Cardiology Fellow, PGY-6

## 2018-06-17 NOTE — Telephone Encounter (Signed)
Follow Up:    Pt is calling to find out if she is able to go on vacation and also still have some questions about her medication.

## 2018-06-17 NOTE — Telephone Encounter (Signed)
Patient really cant decide if she feels better. Got a new diagnosis for her husband today. Overwhelmed. Wants to go to beach with her friend next week Mon-Fri. Does not have BP cuff. Will check at [facility] and record.

## 2018-06-17 NOTE — Telephone Encounter (Signed)
New message ° ° °Patient is returning your call. °

## 2018-06-18 ENCOUNTER — Ambulatory Visit (INDEPENDENT_AMBULATORY_CARE_PROVIDER_SITE_OTHER): Payer: Medicare Other

## 2018-06-18 ENCOUNTER — Telehealth: Payer: Self-pay | Admitting: Internal Medicine

## 2018-06-18 ENCOUNTER — Encounter: Payer: Self-pay | Admitting: Internal Medicine

## 2018-06-18 ENCOUNTER — Ambulatory Visit: Payer: Medicare Other | Admitting: Internal Medicine

## 2018-06-18 VITALS — BP 124/82 | HR 76 | Ht 68.0 in | Wt 244.4 lb

## 2018-06-18 DIAGNOSIS — I10 Essential (primary) hypertension: Secondary | ICD-10-CM | POA: Diagnosis not present

## 2018-06-18 DIAGNOSIS — I5022 Chronic systolic (congestive) heart failure: Secondary | ICD-10-CM

## 2018-06-18 DIAGNOSIS — I493 Ventricular premature depolarization: Secondary | ICD-10-CM | POA: Diagnosis not present

## 2018-06-18 DIAGNOSIS — R002 Palpitations: Secondary | ICD-10-CM

## 2018-06-18 NOTE — Telephone Encounter (Signed)
New Message        Patient is having trouble breathing and speaking, she is also crying.

## 2018-06-18 NOTE — Telephone Encounter (Signed)
Crystal Yoder called in today extremely upset and uncontrollably crying because her HR was 34 when she had it checked at [facility] last night and again this morning when her son checked it. She kept saying she is overwhelmed. Her husband was just diagnosed with early Alzheimer's, they are moving because they can't pay their bills, and her friend who is a nurse told her she "looks good to be almost dead."  She was inconsolable and would not allow for me to speak.   Attempted to teach patient how to count her HR, but she stopped counting after hitting 14 and said she "probably missed some." She denies dizziness but says she is tired.  She has not taken any of her medications today, including her Coreg. Her last BP was 111/71. Discussed with Dr. Tenny Craw, who agrees to see the patient today. Scheduled patient for evaluation with Dr. Tenny Craw today at 1420. She was grateful for assistance.

## 2018-06-18 NOTE — Progress Notes (Addendum)
Cardiology Office Note   Date:  06/20/2018   ID:  Crystal Yoder Feb 12, 1946, MRN [id number]  PCP:  Crystal Montana, MD  Cardiologist:   Crystal Pates, MD   F/U of NICM     History of Present Illness: Crystal Yoder is a 72 y.o. female with a history of HTN NICM (LVEF 45 to 50%) GERD, HL, reactive airway dz  I saw her in Oct  Complained of more fatigue at that time   Echo was unchanged LVEF 45% I saw her in Earlier this month  She complained of SOB with bending and I recomm adding lasix to regimen   I also switch Cozaar to entresto  The pt came in on 8/20 for BPcheck  and labs   Was SOB , she said worse than before  change in meds   Fatigued   Pt's pulse irreg  EKG showed frequent PVCs   It was recomm she cut back on lasix to 20 and K to 10    Labs:  BNP improved   REcomm to continue The pt called in this AM very upset   HR 34 at check at [facility]   BP 111/71     She denies CP   Denies dizziness         Current Meds  Medication Sig  . furosemide (LASIX) 40 MG tablet Take 0.5 tablets (20 mg total) by mouth every other day.  . Multiple Vitamin (MULTIVITAMIN WITH MINERALS) TABS tablet Take 1 tablet by mouth daily.  . pantoprazole (PROTONIX) 40 MG tablet Take 40 mg by mouth daily.   . potassium chloride (K-DUR) 10 MEQ tablet Take 1 tablet (10 mEq total) by mouth every other day.  . sacubitril-valsartan (ENTRESTO) 24-26 MG Take 1 tablet by mouth 2 (two) times daily.  . [DISCONTINUED] carvedilol (COREG) 3.125 MG tablet TAKE 1 TABLET BY MOUTH TWICE DAILY WITH MEALS  . [DISCONTINUED] potassium chloride SA (K-DUR,KLOR-CON) 20 MEQ tablet Take 1 tablet (20 mEq total) by mouth daily.     Allergies:   Penicillins; Sulfa antibiotics; and Sulfonamide derivatives   Past Medical History:  Diagnosis Date  . Arthritis   . Arthropathy, unspecified, site unspecified   . COPD (chronic obstructive pulmonary disease) (HCC)   . Esophageal reflux   . Essential hypertension 05/31/2017  .  Obesity, unspecified   . Obstructive sleep apnea (adult) (pediatric)   . Other acute sinusitis   . Other primary cardiomyopathies   . Psoriasis   . Shortness of breath   . Unspecified venous (peripheral) insufficiency     Past Surgical History:  Procedure Laterality Date  . ABDOMINAL HYSTERECTOMY  2011  . BLADDER SURGERY  2011   tac=ant/post  . CHOLECYSTECTOMY N/A 06/01/2017   Procedure: LAPAROSCOPIC CHOLECYSTECTOMY;  Surgeon: Gaynelle Adu, MD;  Location: [facility];  Service: General;  Laterality: N/A;  . IRRIGATION AND DEBRIDEMENT OF WOUND WITH SPLIT THICKNESS SKIN GRAFT Right 01/25/2014   Procedure: RIGHT FOOT IRRIGATION AND DEBRIDEMENT WITH ACELL/SPLICK THICKNESS SKINGRAFT WITH VAC;  Surgeon: Wayland Denis, DO;  Location: [facility];  Service: Plastics;  Laterality: Right;  . PELVIC FLOOR REPAIR    . REPLACEMENT TOTAL KNEE     rt and lt  . 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 Her smoking use included cigarettes. She has a 5.00 pack-year smoking history. She has  never used smokeless tobacco. She reports that she drinks alcohol. She reports that she does not use drugs.   Family History:  The patient's family history includes Diabetes in her paternal grandfather; Diverticulitis in her father; Prostate cancer in her father.    ROS:  Please see the history of present illness. All other systems are reviewed and  Negative to the above problem except as noted.    PHYSICAL EXAM: VS:  BP 124/82 (BP Location: Left Arm, Patient Position: Sitting, Cuff Size: Large)   Pulse 76   Ht 5\' 8"  (1.727 m)   Wt 244 lb 6.4 oz (110.9 kg)   BMI 37.16 kg/m   GEN: Morbidly obese 72 yo in no acute distress  HEENT: normal  Neck: JVP is normal  No, carotid bruits, or masses Cardiac: RRR; no murmurs, rubs, or gallops,Trivial LE edema  Respiratory:  clear to auscultation bilaterally, normal work of  breathing GI: soft, nontender, nondistended, + BS  No hepatomegaly  MS: no deformity Moving all extremities   Skin: Chronic skin changes   Neuro:  Strength and sensation are intact Psych: euthymic mood, full affect   EKG:  EKG is  ordered    SR 76 bpm   LVH with QRS widening  Frequent PVCs   Lipid Panel    Component Value Date/Time   CHOL 198 11/05/2017 1220   TRIG 81 11/05/2017 1220   TRIG 75 10/23/2006 0812   HDL 73 11/05/2017 1220   CHOLHDL 2.7 11/05/2017 1220   CHOLHDL 3 10/31/2013 1440   VLDL 8.4 10/31/2013 1440   LDLCALC 109 (H) 11/05/2017 1220   LDLDIRECT 121.5 10/31/2013 1440      Wt Readings from Last 3 Encounters:  06/18/18 244 lb 6.4 oz (110.9 kg)  06/15/18 242 lb 1.9 oz (109.8 kg)  05/31/18 253 lb (114.8 kg)      ASSESSMENT AND PLAN:  1 Bradycardia.  I think this may be an error due to PVCs    Will get holter monitor.   COntinue to hold carvedilol    2   NICM  Mild LV dysfunciton on echo  Volume looks better than on last visit   Will recheck labs  2  HTN  BP is OK  3  Lipids   LDL 109 in Jan     4 Pulmonary   5  Psychosocial  Pt remains stressed   Husband recently diagnosied with early dementia      ADDENDUM:   July 27, 2018  Since seen in clinic on 06/18/18:  1.  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  Crystal Yoder  RA 8; RV  55/  Signed, Crystal Pates, MD  06/20/2018 11:20 PM    [facility] [address] [place], [place], [place]  [address] Phone: [phone]; Fax: [phone]

## 2018-06-18 NOTE — Patient Instructions (Signed)
Medication Instructions:  Your physician has recommended you make the following change in your medication:   STOP Carvedilol (Coreg)   Labwork: TODAY - BNP, BMET   Testing/Procedures: Your physician has recommended that you wear a holter monitor. Holter monitors are medical devices that record the heart's electrical activity. Doctors most often use these monitors to diagnose arrhythmias. Arrhythmias are problems with the speed or rhythm of the heartbeat. The monitor is a small, portable device. You can wear one while you do your normal daily activities. This is usually used to diagnose what is causing palpitations/syncope (passing out).  Your physician has requested that you have an echocardiogram. Echocardiography is a painless test that uses sound waves to create images of your heart. It provides your doctor with information about the size and shape of your heart and how well your heart's chambers and valves are working. This procedure takes approximately one hour. There are no restrictions for this procedure.   Follow-Up: Your physician recommends that you return for a follow-up appointment in: 4 weeks or sooner based on test results   If you need a refill on your cardiac medications before your next appointment, please call your pharmacy.   Thank you for choosing [facility]! Eligha Bridegroom, RN [phone]

## 2018-06-19 LAB — BASIC METABOLIC PANEL
BUN/Creatinine Ratio: 29 — ABNORMAL HIGH (ref 12–28)
BUN: 30 mg/dL — ABNORMAL HIGH (ref 8–27)
CALCIUM: 9.6 mg/dL (ref 8.7–10.3)
CO2: 25 mmol/L (ref 20–29)
CREATININE: 1.03 mg/dL — AB (ref 0.57–1.00)
Chloride: 105 mmol/L (ref 96–106)
GFR, EST AFRICAN AMERICAN: 63 mL/min/{1.73_m2} (ref >59)
GFR, EST NON AFRICAN AMERICAN: 54 mL/min/{1.73_m2} — AB (ref >59)
Glucose: 94 mg/dL (ref 65–99)
Potassium: 4.9 mmol/L (ref 3.5–5.2)
Sodium: 143 mmol/L (ref 134–144)

## 2018-06-19 LAB — PRO B NATRIURETIC PEPTIDE: NT-Pro BNP: 699 pg/mL — ABNORMAL HIGH (ref 0–301)

## 2018-06-21 ENCOUNTER — Telehealth: Payer: Self-pay | Admitting: Internal Medicine

## 2018-06-21 NOTE — Telephone Encounter (Signed)
New message    Patient needs call asap she is getting ready to go out of town and  needs lab work  [phone] home

## 2018-06-21 NOTE — Telephone Encounter (Signed)
I spoke to Ms. Crystal Yoder. She asked about lab results and monitor results. Just dropped off holter this morning. Said we didn't lead her believe that it would take days to get results from this. Waiting for this information to determine if she can go to beach.  Advised that although Dr. Tenny Craw has not sent labs to me that they appear stable and if any med changes are necessary we can do that over the phone.  Advised monitor results will mostly likely not be available until closer to end of the week.  She wants Dr. Tenny Craw to know that if she even goes to beach now, she will be driving alone, and will this be ok. I urged her to go if she feels comfortable driving and that there are medical facilities nearby if needed.

## 2018-06-22 ENCOUNTER — Other Ambulatory Visit (HOSPITAL_COMMUNITY): Payer: Medicare Other

## 2018-06-24 ENCOUNTER — Telehealth: Payer: Self-pay

## 2018-06-24 DIAGNOSIS — I493 Ventricular premature depolarization: Secondary | ICD-10-CM

## 2018-06-24 MED ORDER — CARVEDILOL 3.125 MG PO TABS: 3.1250 mg | ORAL_TABLET | Freq: Every day | ORAL | 3 refills | Status: DC

## 2018-06-24 NOTE — Telephone Encounter (Signed)
Called patient about holter results. Patient will come in tomorrow for lab work, since she is out of town today. Patient will start back on Coreg 3.125 mg daily.

## 2018-06-24 NOTE — Telephone Encounter (Signed)
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

## 2018-06-25 ENCOUNTER — Other Ambulatory Visit: Payer: Medicare Other | Admitting: *Deleted

## 2018-06-25 DIAGNOSIS — I493 Ventricular premature depolarization: Secondary | ICD-10-CM

## 2018-06-27 LAB — SEDIMENTATION RATE: Sed Rate: 28 mm/hr (ref 0–40)

## 2018-06-27 LAB — TROPONIN T: Troponin T TROPT: <0.01 ng/mL (ref <0.011)

## 2018-06-30 ENCOUNTER — Ambulatory Visit (HOSPITAL_COMMUNITY): Payer: Medicare Other | Attending: Cardiovascular Disease

## 2018-06-30 ENCOUNTER — Other Ambulatory Visit: Payer: Self-pay

## 2018-06-30 DIAGNOSIS — Z87891 Personal history of nicotine dependence: Secondary | ICD-10-CM | POA: Diagnosis not present

## 2018-06-30 DIAGNOSIS — Z6837 Body mass index (BMI) 37.0-37.9, adult: Secondary | ICD-10-CM | POA: Diagnosis not present

## 2018-06-30 DIAGNOSIS — R002 Palpitations: Secondary | ICD-10-CM

## 2018-06-30 DIAGNOSIS — I088 Other rheumatic multiple valve diseases: Secondary | ICD-10-CM | POA: Insufficient documentation

## 2018-06-30 DIAGNOSIS — E669 Obesity, unspecified: Secondary | ICD-10-CM | POA: Insufficient documentation

## 2018-06-30 DIAGNOSIS — I429 Cardiomyopathy, unspecified: Secondary | ICD-10-CM | POA: Diagnosis not present

## 2018-06-30 DIAGNOSIS — I5022 Chronic systolic (congestive) heart failure: Secondary | ICD-10-CM | POA: Insufficient documentation

## 2018-06-30 DIAGNOSIS — E785 Hyperlipidemia, unspecified: Secondary | ICD-10-CM | POA: Diagnosis not present

## 2018-07-07 ENCOUNTER — Encounter: Payer: Self-pay | Admitting: *Deleted

## 2018-07-07 ENCOUNTER — Telehealth: Payer: Self-pay | Admitting: Internal Medicine

## 2018-07-07 DIAGNOSIS — I519 Heart disease, unspecified: Secondary | ICD-10-CM

## 2018-07-07 DIAGNOSIS — I493 Ventricular premature depolarization: Secondary | ICD-10-CM

## 2018-07-07 NOTE — Telephone Encounter (Signed)
Echo result notes from Dr. Tenny Craw: Notes recorded by Pricilla Riffle, MD on 07/07/2018 at 8:42 AM EDT Reviewed with patient  LVEF is down from previous echo, probably a litle better than reported  QUestion if related to PVCs Reviewed with EP Would recomm CT coronary angiogram to redefine anatomy prior to further EP evaluation  Called patient back. Advised orders have been placed and once authorization is complete she will be called to scheduled this test. Advised she may need lab work prior, depending on what the test date is.

## 2018-07-07 NOTE — Telephone Encounter (Signed)
New message   Patient is calling per the phone conversation yesterday with Dr. Tenny Craw to see when the heart  test that was discussed will be scheduled? Please call to discuss.

## 2018-07-12 ENCOUNTER — Telehealth: Payer: Self-pay | Admitting: Internal Medicine

## 2018-07-12 NOTE — Telephone Encounter (Signed)
Spoke with patient who states she is under a great deal of stress.  Has friends in helping prepare house to put up for sale. She has had some brief episodes of chest pain, hasn't paid a lot of attention to it.  Not sure if it is brought on by anything other than stress.    Is very surprised/concerned that coronary CT has not been scheduled yet.  I informed her of the length of time it takes to schedule this when we ordered it.  Reiterated this to her today.  Advised she may call her insurance company to see if that would help speed anything up.   Advised to be seen in ER for eval for any episodes of chest pain.  Pt verbalizes understanding and agreement.

## 2018-07-12 NOTE — Telephone Encounter (Signed)
Call to discuss when she will get her CT.   Explain that we were still waiting on the insurance to approve the test.  She want to talk with the nurse about her sob and still not feeling well.

## 2018-07-15 ENCOUNTER — Encounter: Payer: Self-pay | Admitting: *Deleted

## 2018-07-16 NOTE — Telephone Encounter (Signed)
Reviewed with patient instructions for coronary CT scan on Monday. She will not take lasix. Aware of eating/drinking restrictions, location and to arrive 30-45 min early. Denies a known contrast allergy. Pt verbalizes understanding of instructions provided.

## 2018-07-19 ENCOUNTER — Ambulatory Visit (HOSPITAL_COMMUNITY)
Admission: RE | Admit: 2018-07-19 | Discharge: 2018-07-19 | Disposition: A | Discharge status: Home / Self Care | Payer: Medicare Other | Source: Ambulatory Visit | Attending: Internal Medicine | Admitting: Internal Medicine

## 2018-07-19 ENCOUNTER — Ambulatory Visit (HOSPITAL_COMMUNITY): Payer: Medicare Other

## 2018-07-19 DIAGNOSIS — K219 Gastro-esophageal reflux disease without esophagitis: Secondary | ICD-10-CM | POA: Diagnosis not present

## 2018-07-19 DIAGNOSIS — I517 Cardiomegaly: Secondary | ICD-10-CM

## 2018-07-19 DIAGNOSIS — I429 Cardiomyopathy, unspecified: Secondary | ICD-10-CM | POA: Diagnosis not present

## 2018-07-19 DIAGNOSIS — I519 Heart disease, unspecified: Secondary | ICD-10-CM | POA: Diagnosis not present

## 2018-07-19 DIAGNOSIS — E785 Hyperlipidemia, unspecified: Secondary | ICD-10-CM | POA: Insufficient documentation

## 2018-07-19 DIAGNOSIS — I10 Essential (primary) hypertension: Secondary | ICD-10-CM | POA: Insufficient documentation

## 2018-07-19 DIAGNOSIS — I493 Ventricular premature depolarization: Secondary | ICD-10-CM | POA: Insufficient documentation

## 2018-07-19 MED ORDER — NITROGLYCERIN 0.4 MG SL SUBL
SUBLINGUAL_TABLET | SUBLINGUAL | Status: AC
  Filled 2018-07-19: qty 2

## 2018-07-19 MED ORDER — METOPROLOL TARTRATE 5 MG/5ML IV SOLN
10.0000 mg | INTRAVENOUS | Status: AC | PRN
  Administered 2018-07-19: 10 mg via INTRAVENOUS
  Filled 2018-07-19: qty 10

## 2018-07-19 MED ORDER — IOPAMIDOL (ISOVUE-370) INJECTION 76%
100.0000 mL | Freq: Once | INTRAVENOUS | Status: AC | PRN
  Administered 2018-07-19: 100 mL via INTRAVENOUS

## 2018-07-19 MED ORDER — METOPROLOL TARTRATE 5 MG/5ML IV SOLN
5.0000 mg | INTRAVENOUS | Status: DC | PRN
  Filled 2018-07-19: qty 5

## 2018-07-19 MED ORDER — METOPROLOL TARTRATE 5 MG/5ML IV SOLN
INTRAVENOUS | Status: AC
  Filled 2018-07-19: qty 20

## 2018-07-19 MED ORDER — NITROGLYCERIN 0.4 MG SL SUBL
0.8000 mg | SUBLINGUAL_TABLET | Freq: Once | SUBLINGUAL | Status: AC
  Administered 2018-07-19: 0.8 mg via SUBLINGUAL
  Filled 2018-07-19: qty 25

## 2018-07-20 IMAGING — CT CT ABD-PELV W/ CM
2 of 5 series · 15 of 46 positions shown, 17 images · IV contrast (iopamidol)
Comparison: None available.

CLINICAL DATA: Initial evaluation for acute abdominal pain,
distension.

EXAM:
CT ABDOMEN AND PELVIS WITH CONTRAST
TECHNIQUE: Multidetector CT imaging of the abdomen and pelvis was performed
using the standard protocol following bolus administration of
intravenous contrast.
CONTRAST:  100mL QVIVR4-AOO IOPAMIDOL (QVIVR4-AOO) INJECTION 61%

[Series 2: abd/pel with · axial · 0.91mm/px · z∈[-512,-122]mm · 12 of 92 slices shown, 14 images]
[im 7/92  soft-tissue]
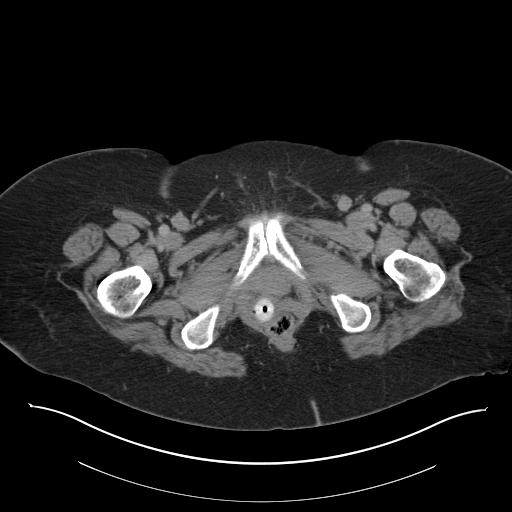
[im 7/92  bone]
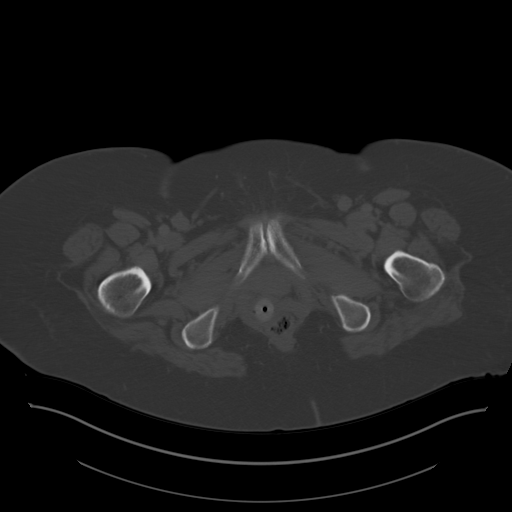
[im 13/92  soft-tissue]
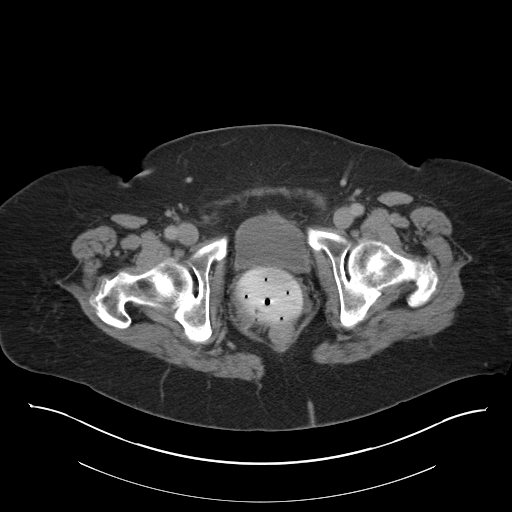
[im 19/92  soft-tissue]
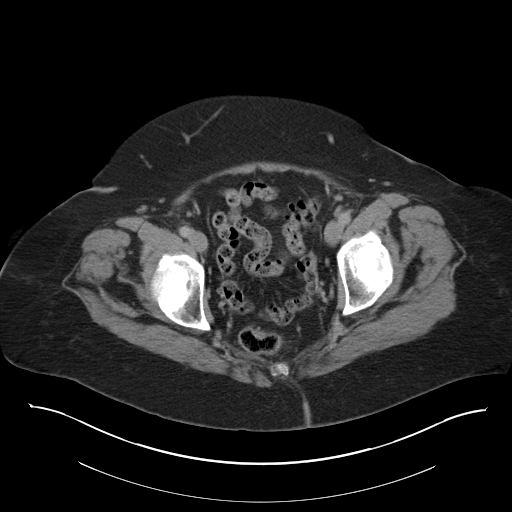
[im 31/92  soft-tissue]
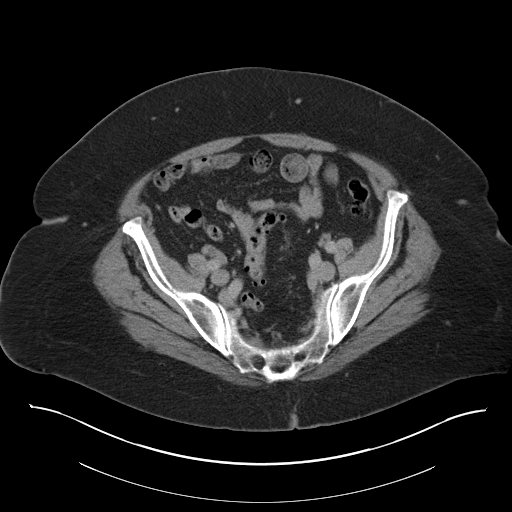
[im 37/92  soft-tissue]
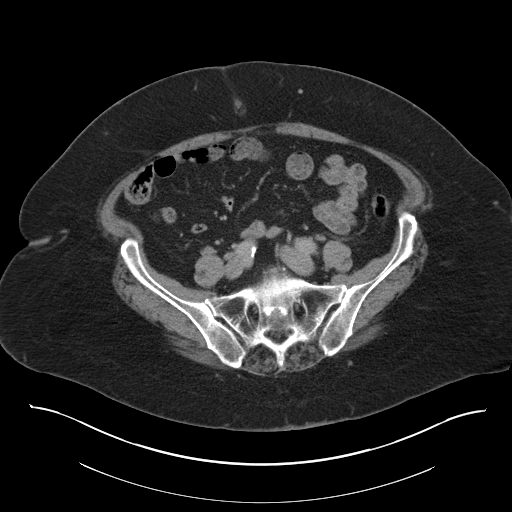
[im 43/92  soft-tissue]
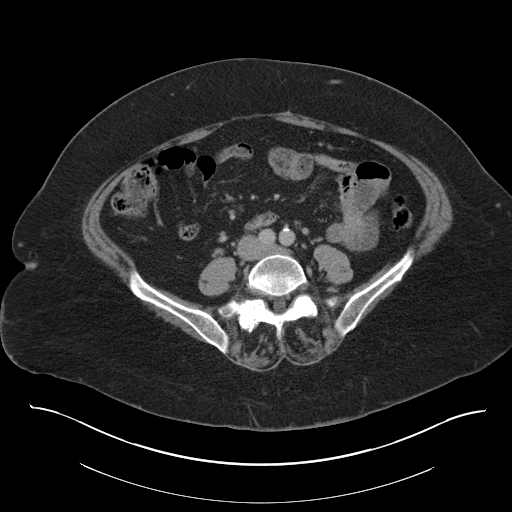
[im 49/92  soft-tissue]
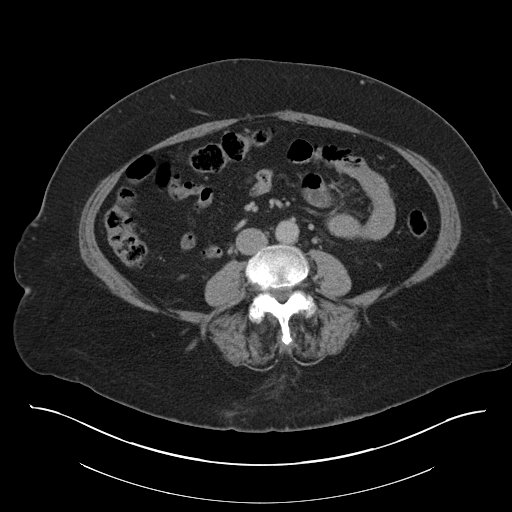
[im 55/92  soft-tissue]
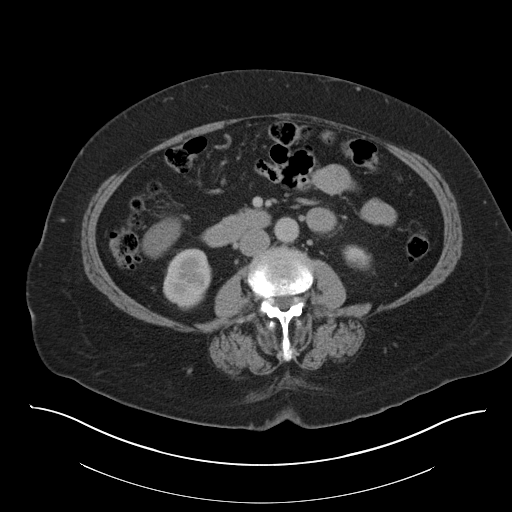
[im 61/92  soft-tissue]
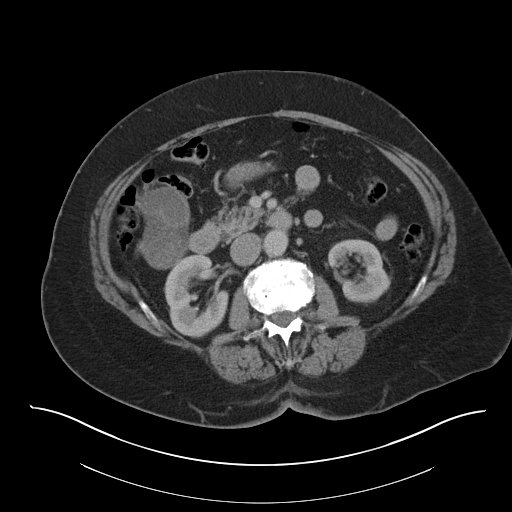
[im 61/92  bone]
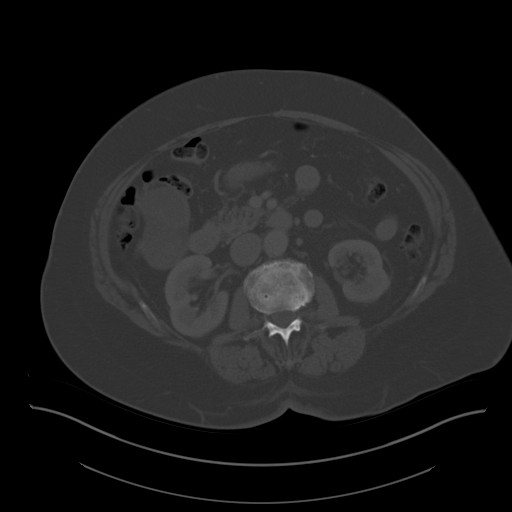
[im 73/92  soft-tissue]
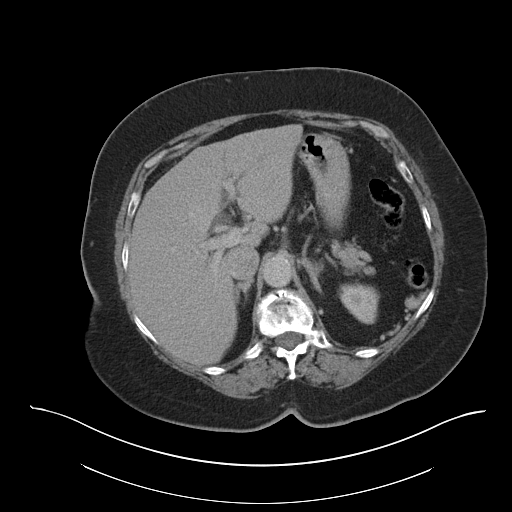
[im 79/92  soft-tissue]
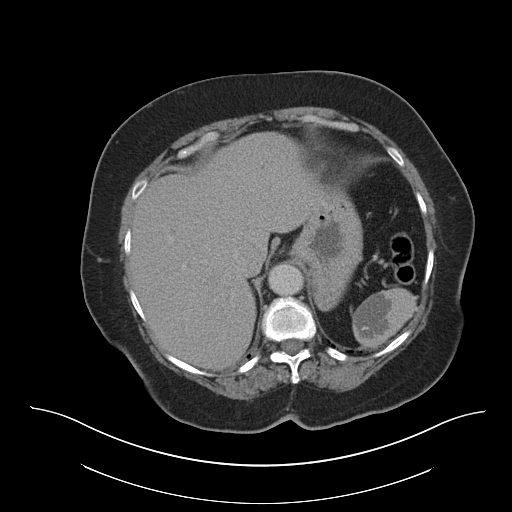
[im 85/92  soft-tissue]
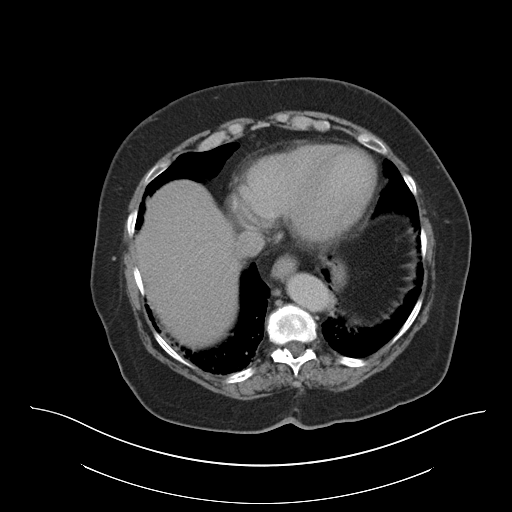

[Series 5: coronal a/|p · coronal · 0.78mm/px · 3 of 159 slices shown]
[im 53/159  soft-tissue]
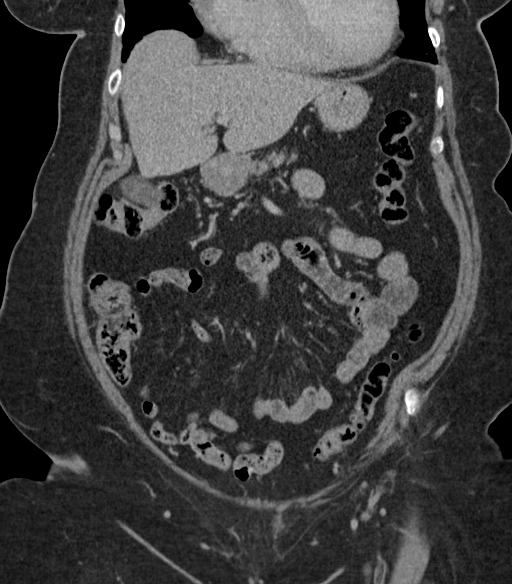
[im 71/159  soft-tissue]
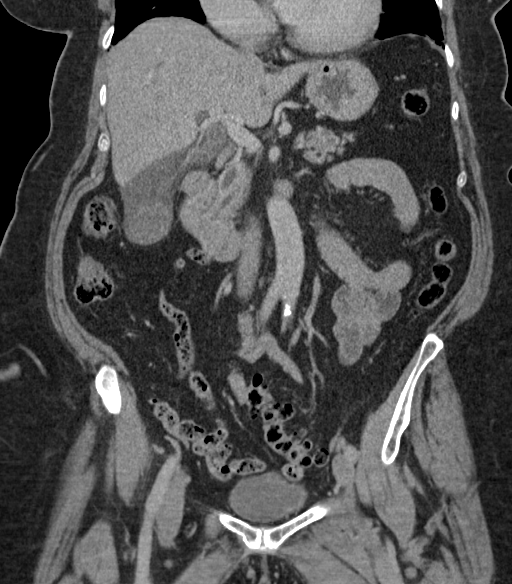
[im 88/159  soft-tissue]
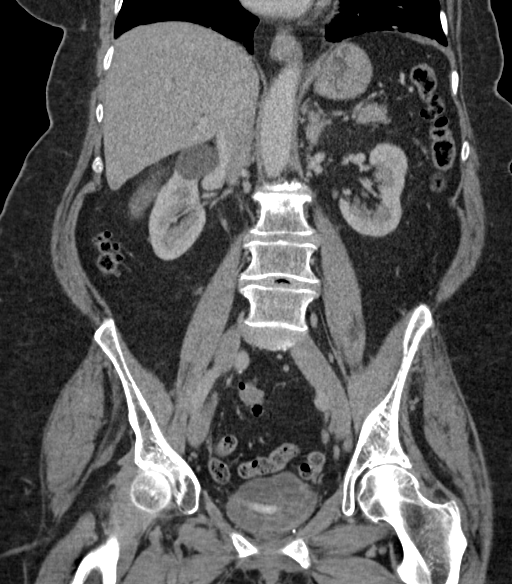

[15 of 46 positions shown; findings below may reference images not displayed]

FINDINGS: Lower chest: Scattered patchy and linear atelectatic changes present
within the visualized lung bases. Visualized lung bases are
otherwise clear. 5 mm subpleural nodule noted at the peripheral
right middle lobe (series 4, image 4), indeterminate. Mild
cardiomegaly, partially visualized.

Hepatobiliary: Liver demonstrates a normal contrast enhanced
appearance.

Subcentimeter focus of hyperdense T[place]bua/gas noted within the
gallbladder lumen, likely reflecting internal stone. Gallbladder
wall somewhat ill-defined with suggestion of pericholecystic free
fluid, suggesting possible mild inflammation. No biliary dilatation.

Pancreas: Pancreas within normal limits.

Spleen: Somewhat irregular cystic lesion measuring 3.6 cm with
scattered calcifications noted within the spleen, of doubtful
significance. Spleen otherwise unremarkable.

Adrenals/Urinary Tract: Adrenal glands within normal limits. Kidneys
equal in size with symmetric enhancement. Scattered cyst noted
within the right kidney, largest of which measures 3.6 cm. No
nephrolithiasis, hydronephrosis, or focal enhancing renal mass. No
hydroureter. Bladder largely decompressed without acute abnormality.

Stomach/Bowel: Stomach within normal limits. No evidence for bowel
obstruction. Appendix normal. Sigmoid diverticulosis without
evidence for acute diverticulitis. No acute inflammatory changes
seen about the bowels.

Vascular/Lymphatic: Normal intravascular enhancement seen throughout
the intra-abdominal aorta and its branch vessels. Mild aorto
bi-iliac atherosclerotic disease. No adenopathy.

Reproductive: Uterus is absent. Ovaries within normal limits. Pelvic
support device in place.

Other: No free intraperitoneal air. No free fluid or ascites. Note
made of a few varices within the left abdomen.

Musculoskeletal: No acute osseus abnormality. No worrisome lytic or
blastic osseous lesions. Multilevel degenerate spondylolysis noted
within the visualized spine, greatest at L2-3.
IMPRESSION: 1. Cholelithiasis with mild pericholecystic free fluid, which may
reflect sequelae of acute biliary pathology. Correlation with LFTs
suggested. Additionally, further evaluation with dedicated right
upper quadrant ultrasound may be helpful for further evaluation as
warranted.
2. No other acute intra-abdominal or pelvic process.
3. Sigmoid diverticulosis without evidence for acute diverticulitis.
4. Mild aorto bi-iliac atherosclerotic disease.
5. 5 mm right middle lobe pulmonary nodule, indeterminate. No
follow-up needed if patient is low-risk. Non-contrast chest CT can
be considered in 12 months if patient is high-risk. This
recommendation follows the consensus statement: Guidelines for
Management of Incidental Pulmonary Nodules Detected on CT Images:

## 2018-07-21 ENCOUNTER — Telehealth: Payer: Self-pay | Admitting: Internal Medicine

## 2018-07-21 DIAGNOSIS — R0602 Shortness of breath: Secondary | ICD-10-CM

## 2018-07-21 NOTE — Telephone Encounter (Signed)
Courtesy call from Dr Ross/ Cardiology. Pt has large pulmonary arteries and may have pulmonary hypertension.  Dr Tenny Craw is ordering CTa chest then probable RH cath.

## 2018-07-21 NOTE — Telephone Encounter (Signed)
Order placed and scheduled at hospital for this afternoon 5:30 pm.  Pt to arrive by 5pm. I tried to reach patient back but she did not answer.  Voicemail was full. I spoke with her husband at home and informed him.  He will work on reaching her.  He is aware of where she needs to go and what time to be there and that this has been pre certified w/ insurance already.

## 2018-07-21 NOTE — Telephone Encounter (Signed)
Notes recorded by Pricilla Riffle, MD on 07/21/2018 at 10:24 AM EDT Reviewed CT results with patient as well as with Dr Melba Coon (pulmonary) Minimal coronary plaquing Pulmonary artery is severely dilated  New from previous Would recomm CT angiogram to evaluate for PE  Again, pt notified and is expecting a call to schedule  _______________________________________________________ Sherron Monday with patient. She is aware order has been placed for CT angio. She would like CPT code so she can call insurance company. I have cancelled her office visit for tomorrow with Dr. Tenny Craw.  Per Dr. Tenny Craw she has spoken to patient and will again after this CT is completed.

## 2018-07-21 NOTE — Telephone Encounter (Signed)
Follow up:   Patient returning call and she has some questions.

## 2018-07-22 ENCOUNTER — Ambulatory Visit: Payer: Medicare Other | Admitting: Internal Medicine

## 2018-07-22 ENCOUNTER — Ambulatory Visit (HOSPITAL_COMMUNITY)
Admission: RE | Admit: 2018-07-22 | Discharge: 2018-07-22 | Disposition: A | Discharge status: Home / Self Care | Payer: Medicare Other | Source: Ambulatory Visit | Attending: Internal Medicine | Admitting: Internal Medicine

## 2018-07-22 ENCOUNTER — Encounter: Payer: Self-pay | Admitting: *Deleted

## 2018-07-22 DIAGNOSIS — I517 Cardiomegaly: Secondary | ICD-10-CM | POA: Diagnosis not present

## 2018-07-22 DIAGNOSIS — R0602 Shortness of breath: Secondary | ICD-10-CM | POA: Diagnosis not present

## 2018-07-22 MED ORDER — IOPAMIDOL (ISOVUE-370) INJECTION 76%
INTRAVENOUS | Status: AC
  Filled 2018-07-22: qty 100

## 2018-07-22 MED ORDER — IOPAMIDOL (ISOVUE-370) INJECTION 76%
100.0000 mL | Freq: Once | INTRAVENOUS | Status: AC | PRN
  Administered 2018-07-22: 75 mL via INTRAVENOUS

## 2018-07-22 NOTE — Telephone Encounter (Signed)
I spoke with patient. She has been rescheduled for today at 5:30 pm Aware to arrive at 5pm. Dr. Tenny Craw aware.

## 2018-07-22 NOTE — Telephone Encounter (Signed)
Erroneous encounter

## 2018-07-23 ENCOUNTER — Ambulatory Visit: Payer: Medicare Other | Admitting: Internal Medicine

## 2018-07-23 ENCOUNTER — Telehealth: Payer: Self-pay

## 2018-07-23 DIAGNOSIS — I519 Heart disease, unspecified: Secondary | ICD-10-CM

## 2018-07-23 DIAGNOSIS — I10 Essential (primary) hypertension: Secondary | ICD-10-CM

## 2018-07-23 DIAGNOSIS — I5022 Chronic systolic (congestive) heart failure: Secondary | ICD-10-CM

## 2018-07-23 DIAGNOSIS — Z79899 Other long term (current) drug therapy: Secondary | ICD-10-CM

## 2018-07-23 DIAGNOSIS — Z01812 Encounter for preprocedural laboratory examination: Secondary | ICD-10-CM

## 2018-07-23 NOTE — Telephone Encounter (Signed)
Pt to have labs and EKG for heart cath on 07/27/18 nurse visit... Pt aware.

## 2018-07-23 NOTE — Telephone Encounter (Signed)
Pt advised per Dr. Tenny Craw that she is scheduled for a right heart cath on Oct 2 with Dr. Tresa Endo... Pt verbalized understanding of her instructions.. Letter left at the front for the pt to pick up when she comes in for labs 07/26/18.

## 2018-07-26 ENCOUNTER — Telehealth: Payer: Self-pay | Admitting: Internal Medicine

## 2018-07-26 NOTE — Telephone Encounter (Signed)
Follow up:    Patient returning a call no one has called her back concerning some medication. Please call

## 2018-07-26 NOTE — Telephone Encounter (Signed)
New Message:     Pt c/o medication issue:  1. Name of Medication: carvedilol (COREG) 3.125 MG tablet  2. How are you currently taking this medication (dosage and times per day)? Take 1 tablet (3.125 mg total) by mouth daily.  3. Are you having a reaction (difficulty breathing--STAT)? No   4. What is your medication issue? Patient is asking for clarification about the medication and when she should take it

## 2018-07-26 NOTE — Telephone Encounter (Signed)
Patient reports there were some changes to her medicines recently and she wanted to be sure she is taking correctly.  Coreg: 3.125 mg is ordered daily--this is how she is taking. It was ordered daily to start after her holter monitor was reviewed by Dr. Tenny Craw on 06/18/18.

## 2018-07-27 ENCOUNTER — Ambulatory Visit (INDEPENDENT_AMBULATORY_CARE_PROVIDER_SITE_OTHER): Payer: Medicare Other

## 2018-07-27 ENCOUNTER — Other Ambulatory Visit: Payer: Medicare Other

## 2018-07-27 DIAGNOSIS — Z01812 Encounter for preprocedural laboratory examination: Secondary | ICD-10-CM | POA: Diagnosis not present

## 2018-07-27 DIAGNOSIS — R002 Palpitations: Secondary | ICD-10-CM | POA: Diagnosis not present

## 2018-07-27 DIAGNOSIS — Z79899 Other long term (current) drug therapy: Secondary | ICD-10-CM

## 2018-07-27 DIAGNOSIS — I10 Essential (primary) hypertension: Secondary | ICD-10-CM

## 2018-07-27 DIAGNOSIS — I519 Heart disease, unspecified: Secondary | ICD-10-CM

## 2018-07-27 DIAGNOSIS — I5022 Chronic systolic (congestive) heart failure: Secondary | ICD-10-CM

## 2018-07-27 LAB — CBC WITH DIFFERENTIAL/PLATELET
Basophils Absolute: 0 10*3/uL (ref 0.0–0.2)
Basos: 1 %
EOS (ABSOLUTE): 0.2 10*3/uL (ref 0.0–0.4)
EOS: 4 %
HEMOGLOBIN: 13.6 g/dL (ref 11.1–15.9)
Hematocrit: 42.6 % (ref 34.0–46.6)
IMMATURE GRANS (ABS): 0 10*3/uL (ref 0.0–0.1)
Immature Granulocytes: 0 %
LYMPHS ABS: 1.6 10*3/uL (ref 0.7–3.1)
Lymphs: 34 %
MCH: 30.2 pg (ref 26.6–33.0)
MCHC: 31.9 g/dL (ref 31.5–35.7)
MCV: 95 fL (ref 79–97)
MONOCYTES: 10 %
MONOS ABS: 0.5 10*3/uL (ref 0.1–0.9)
NEUTROS ABS: 2.4 10*3/uL (ref 1.4–7.0)
Neutrophils: 51 %
Platelets: 186 10*3/uL (ref 150–450)
RBC: 4.5 x10E6/uL (ref 3.77–5.28)
RDW: 14.7 % (ref 12.3–15.4)
WBC: 4.6 10*3/uL (ref 3.4–10.8)

## 2018-07-27 LAB — BASIC METABOLIC PANEL
BUN/Creatinine Ratio: 21 (ref 12–28)
BUN: 21 mg/dL (ref 8–27)
CO2: 24 mmol/L (ref 20–29)
CREATININE: 1 mg/dL (ref 0.57–1.00)
Calcium: 9.1 mg/dL (ref 8.7–10.3)
Chloride: 104 mmol/L (ref 96–106)
GFR calc non Af Amer: 56 mL/min/{1.73_m2} — ABNORMAL LOW (ref >59)
GFR, EST AFRICAN AMERICAN: 65 mL/min/{1.73_m2} (ref >59)
GLUCOSE: 95 mg/dL (ref 65–99)
Potassium: 5.2 mmol/L (ref 3.5–5.2)
SODIUM: 142 mmol/L (ref 134–144)

## 2018-07-27 NOTE — Progress Notes (Signed)
1.) Reason for visit: EKG and lab work  2.) Name of MD requesting visit: Dr. Tenny Craw  3.) H&P: Patient has history of HTN, NICM, GERD, and HL. Patient scheduled for heart cath and has come into the office for EKG  4.) ROS related to problem: Patient complaining of fatigue and stressed about having a yard sale this weekend.   5.) Assessment and plan per MD: Has DOD, Dr. Mayford Knife,  review EKG and signed. Sent copy with patient, in case EKG does not get scanned in time fore patient's heart cath. Patient is having lab work today for procedure tomorrow. Will route to Dr. Tenny Craw.

## 2018-07-27 NOTE — Addendum Note (Signed)
Addended by: Virl Axe, Robben Jagiello L on: 07/27/2018 10:16 AM   Modules accepted: Orders

## 2018-07-27 NOTE — Telephone Encounter (Signed)
Lab work reordered for STAT since heart cath is tomorrow.

## 2018-07-27 NOTE — Patient Instructions (Signed)
Medication Instructions:  Your physician recommends that you continue on your current medications as directed. Please refer to the Current Medication list given to you today.  Labwork: Your physician recommends that you have lab work today- BMET and CBC  Testing/Procedures: NONE  Follow-Up: Your physician wants you to follow-up in: 2 weeks with PA or NP  If you need a refill on your cardiac medications before your next appointment, please call your pharmacy.

## 2018-07-28 ENCOUNTER — Ambulatory Visit (HOSPITAL_COMMUNITY)
Admission: RE | Admit: 2018-07-28 | Discharge: 2018-07-28 | Disposition: A | Discharge status: Home / Self Care | Payer: Medicare Other | Source: Ambulatory Visit | Attending: Cardiovascular Disease | Admitting: Cardiovascular Disease

## 2018-07-28 ENCOUNTER — Encounter (HOSPITAL_COMMUNITY)
Admission: RE | Disposition: A | Discharge status: Home / Self Care | Payer: Self-pay | Source: Ambulatory Visit | Attending: Cardiovascular Disease

## 2018-07-28 DIAGNOSIS — G4733 Obstructive sleep apnea (adult) (pediatric): Secondary | ICD-10-CM | POA: Insufficient documentation

## 2018-07-28 DIAGNOSIS — Z9889 Other specified postprocedural states: Secondary | ICD-10-CM | POA: Insufficient documentation

## 2018-07-28 DIAGNOSIS — E785 Hyperlipidemia, unspecified: Secondary | ICD-10-CM | POA: Diagnosis not present

## 2018-07-28 DIAGNOSIS — M1990 Unspecified osteoarthritis, unspecified site: Secondary | ICD-10-CM | POA: Insufficient documentation

## 2018-07-28 DIAGNOSIS — J449 Chronic obstructive pulmonary disease, unspecified: Secondary | ICD-10-CM | POA: Diagnosis not present

## 2018-07-28 DIAGNOSIS — Z880 Allergy status to penicillin: Secondary | ICD-10-CM | POA: Diagnosis not present

## 2018-07-28 DIAGNOSIS — Z79899 Other long term (current) drug therapy: Secondary | ICD-10-CM | POA: Diagnosis not present

## 2018-07-28 DIAGNOSIS — Z882 Allergy status to sulfonamides status: Secondary | ICD-10-CM | POA: Diagnosis not present

## 2018-07-28 DIAGNOSIS — Z87891 Personal history of nicotine dependence: Secondary | ICD-10-CM | POA: Diagnosis not present

## 2018-07-28 DIAGNOSIS — E669 Obesity, unspecified: Secondary | ICD-10-CM | POA: Insufficient documentation

## 2018-07-28 DIAGNOSIS — Z9081 Acquired absence of spleen: Secondary | ICD-10-CM | POA: Diagnosis not present

## 2018-07-28 DIAGNOSIS — I493 Ventricular premature depolarization: Secondary | ICD-10-CM | POA: Insufficient documentation

## 2018-07-28 DIAGNOSIS — Z9049 Acquired absence of other specified parts of digestive tract: Secondary | ICD-10-CM | POA: Insufficient documentation

## 2018-07-28 DIAGNOSIS — K219 Gastro-esophageal reflux disease without esophagitis: Secondary | ICD-10-CM | POA: Diagnosis not present

## 2018-07-28 DIAGNOSIS — I272 Pulmonary hypertension, unspecified: Secondary | ICD-10-CM

## 2018-07-28 DIAGNOSIS — I10 Essential (primary) hypertension: Secondary | ICD-10-CM | POA: Insufficient documentation

## 2018-07-28 DIAGNOSIS — Z90710 Acquired absence of both cervix and uterus: Secondary | ICD-10-CM | POA: Insufficient documentation

## 2018-07-28 DIAGNOSIS — Z96653 Presence of artificial knee joint, bilateral: Secondary | ICD-10-CM | POA: Diagnosis not present

## 2018-07-28 HISTORY — PX: RIGHT HEART CATH: CATH118263

## 2018-07-28 SURGERY — RIGHT HEART CATH

## 2018-07-28 MED ORDER — HEPARIN (PORCINE) IN NACL 1000-0.9 UT/500ML-% IV SOLN
INTRAVENOUS | Status: DC | PRN
  Administered 2018-07-28: 500 mL

## 2018-07-28 MED ORDER — SODIUM CHLORIDE 0.9 % IV SOLN: 250.0000 mL | INTRAVENOUS | Status: DC | PRN

## 2018-07-28 MED ORDER — SODIUM CHLORIDE 0.9 % IV SOLN
INTRAVENOUS | Status: DC
  Administered 2018-07-28: 11:00:00 via INTRAVENOUS

## 2018-07-28 MED ORDER — LIDOCAINE HCL (PF) 1 % IJ SOLN
INTRAMUSCULAR | Status: AC
  Filled 2018-07-28: qty 30

## 2018-07-28 MED ORDER — FENTANYL CITRATE (PF) 100 MCG/2ML IJ SOLN
INTRAMUSCULAR | Status: DC | PRN
  Administered 2018-07-28: 25 ug via INTRAVENOUS

## 2018-07-28 MED ORDER — SODIUM CHLORIDE 0.9% FLUSH: 3.0000 mL | INTRAVENOUS | Status: DC | PRN

## 2018-07-28 MED ORDER — SODIUM CHLORIDE 0.9% FLUSH: 3.0000 mL | Freq: Two times a day (BID) | INTRAVENOUS | Status: DC

## 2018-07-28 MED ORDER — ASPIRIN 81 MG PO CHEW: 81.0000 mg | CHEWABLE_TABLET | ORAL | Status: DC

## 2018-07-28 MED ORDER — ONDANSETRON HCL 4 MG/2ML IJ SOLN: 4.0000 mg | Freq: Four times a day (QID) | INTRAMUSCULAR | Status: DC | PRN

## 2018-07-28 MED ORDER — ACETAMINOPHEN 325 MG PO TABS: 650.0000 mg | ORAL_TABLET | ORAL | Status: DC | PRN

## 2018-07-28 MED ORDER — MIDAZOLAM HCL 2 MG/2ML IJ SOLN
INTRAMUSCULAR | Status: AC
  Filled 2018-07-28: qty 2

## 2018-07-28 MED ORDER — HEPARIN (PORCINE) IN NACL 1000-0.9 UT/500ML-% IV SOLN
INTRAVENOUS | Status: AC
  Filled 2018-07-28: qty 500

## 2018-07-28 MED ORDER — MIDAZOLAM HCL 2 MG/2ML IJ SOLN
INTRAMUSCULAR | Status: DC | PRN
  Administered 2018-07-28 (×2): 1 mg via INTRAVENOUS

## 2018-07-28 MED ORDER — LIDOCAINE HCL (PF) 1 % IJ SOLN
INTRAMUSCULAR | Status: DC | PRN
  Administered 2018-07-28: 2 mL

## 2018-07-28 MED ORDER — FENTANYL CITRATE (PF) 100 MCG/2ML IJ SOLN
INTRAMUSCULAR | Status: AC
  Filled 2018-07-28: qty 2

## 2018-07-28 SURGICAL SUPPLY — 9 items
CATH BALLN WEDGE 5F 110CM (CATHETERS) ×2 IMPLANT
GUIDEWIRE .025 260CM (WIRE) ×2 IMPLANT
PACK CARDIAC CATHETERIZATION (CUSTOM PROCEDURE TRAY) ×2 IMPLANT
PROTECTION STATION PRESSURIZED (MISCELLANEOUS) ×2
SHEATH GLIDE SLENDER 4/5FR (SHEATH) ×2 IMPLANT
STATION PROTECTION PRESSURIZED (MISCELLANEOUS) ×1 IMPLANT
STOPCOCK MORSE 400PSI 3WAY (MISCELLANEOUS) ×2 IMPLANT
TUBING ART PRESS 72  MALE/FEM (TUBING) ×1
TUBING ART PRESS 72 MALE/FEM (TUBING) ×1 IMPLANT

## 2018-07-28 NOTE — H&P (Addendum)
History & Physical    Patient ID: Crystal Yoder MRN: [id number], DOB/AGE: [id number]   Admit date: 07/28/2018   Primary Physician: Laurann Montana, MD Primary Cardiologist: Dr. Tenny Craw  Patient Profile    72 yo female with PMH of HTN, ICM (45-50%), GERD, HL and reactive airway disease who presents for RHC from the office.   Past Medical History   Past Medical History:  Diagnosis Date  . Arthritis   . Arthropathy, unspecified, site unspecified   . COPD (chronic obstructive pulmonary disease) (HCC)   . Esophageal reflux   . Essential hypertension 05/31/2017  . Obesity, unspecified   . Obstructive sleep apnea (adult) (pediatric)   . Other acute sinusitis   . Other primary cardiomyopathies   . Psoriasis   . Shortness of breath   . Unspecified venous (peripheral) insufficiency     Past Surgical History:  Procedure Laterality Date  . ABDOMINAL HYSTERECTOMY  2011  . BLADDER SURGERY  2011   tac=ant/post  . CHOLECYSTECTOMY N/A 06/01/2017   Procedure: LAPAROSCOPIC CHOLECYSTECTOMY;  Surgeon: Gaynelle Adu, MD;  Location: [facility];  Service: General;  Laterality: N/A;  . IRRIGATION AND DEBRIDEMENT OF WOUND WITH SPLIT THICKNESS SKIN GRAFT Right 01/25/2014   Procedure: RIGHT FOOT IRRIGATION AND DEBRIDEMENT WITH ACELL/SPLICK THICKNESS SKINGRAFT WITH VAC;  Surgeon: Wayland Denis, DO;  Location: [facility];  Service: Plastics;  Laterality: Right;  . PELVIC FLOOR REPAIR    . REPLACEMENT TOTAL KNEE     rt and lt  . 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 Penicillins Hives and Other (See Comments)    Has patient had a PCN reaction causing immediate rash, facial/tongue/throat swelling, SOB or lightheadedness with hypotension: No Has patient had a PCN reaction causing severe rash involving mucus membranes or skin necrosis: No Has patient had a PCN reaction that required hospitalization: No Has patient had a PCN reaction occurring  within the last 10 years: No If all of the above answers are "NO", then may proceed with Cephalosporin use.  . Sulfa Antibiotics Diarrhea, Nausea And Vomiting and Other (See Comments)    Other Reaction: GI Upset Other Reaction: GI Upset  Other Reaction: GI Upset  . Sulfonamide Derivatives Diarrhea and Nausea And Vomiting    History of Present Illness    Crystal Yoder is a 72 yo female with PMH of  HTN, ICM (45-50%), GERD, HL and reactive airway disease. She was seen in the office on 06/18/18 regarding ongoing fatigue. She was set up for outpatient cardiac monitor that showed PVCs, and Echo with decline in EF to 30-35%. Underwent a coronary CT that showed normal LM, and LAD with mild plaque in the Lcx with a Ca++ score of 9. Given her CT findings and echo she was referred for a RHC.   Home Medications    Prior to Admission medications   Medication Sig Start Date End Date Taking? Authorizing Provider  carvedilol (COREG) 3.125 MG tablet Take 1 tablet (3.125 mg total) by mouth daily. 06/24/18 09/22/18 Yes Pricilla Riffle, MD  Cholecalciferol (VITAMIN D3) 5000 units TABS Take 5,000 Units by mouth once a week.   Yes [provider]  furosemide (LASIX) 40 MG tablet Take 0.5 tablets (20 mg total) by mouth every other day. 06/17/18  Yes Pricilla Riffle, MD  Multiple Vitamin (MULTIVITAMIN WITH MINERALS) TABS tablet Take 1 tablet by mouth once a week.    Yes  [provider]  pantoprazole (PROTONIX) 40 MG tablet Take 40 mg by mouth daily.    Yes [provider]  potassium chloride (K-DUR) 10 MEQ tablet Take 1 tablet (10 mEq total) by mouth every other day. 06/17/18  Yes Pricilla Riffle, MD  sacubitril-valsartan (ENTRESTO) 24-26 MG Take 1 tablet by mouth 2 (two) times daily. 06/02/18  Yes Pricilla Riffle, MD    Family History     Family History  Problem Relation Age of Onset  . Prostate cancer Father   . Diverticulitis Father   . Diabetes Paternal Grandfather     Social History      Social History   Socioeconomic History  . Marital status: Married    Spouse name: Not on file  . Number of children: Not on file  . Years of education: Not on file  . Highest education level: Not on file  Occupational History  . Occupation: Engineer, site  Social Needs  . Financial resource strain: Not on file  . Food insecurity:    Worry: Not on file    Inability: Not on file  . Transportation needs:    Medical: Not on file    Non-medical: Not on file  Tobacco Use  . Smoking status: Former Smoker    Packs/day: 0.50    Years: 10.00    Pack years: 5.00    Types: Cigarettes    Last attempt to quit: 10/28/1979    Years since quitting: 38.7  . Smokeless tobacco: Never Used  Substance and Sexual Activity  . Alcohol use: Yes    Comment: rare  . Drug use: No  . Sexual activity: Not on file  Lifestyle  . Physical activity:    Days per week: Not on file    Minutes per session: Not on file  . Stress: Not on file  Relationships  . Social connections:    Talks on phone: Not on file    Gets together: Not on file    Attends religious service: Not on file    Active member of club or organization: Not on file    Attends meetings of clubs or organizations: Not on file    Relationship status: Not on file  . Intimate partner violence:    Fear of current or ex partner: Not on file    Emotionally abused: Not on file    Physically abused: Not on file    Forced sexual activity: Not on file  Other Topics Concern  . Not on file  Social History Narrative  . Not on file     Review of Systems    General:  No chills, fever, night sweats or weight changes.  Cardiovascular:  No chest pain, dyspnea on exertion, edema, orthopnea, palpitations, paroxysmal nocturnal dyspnea. Dermatological: No rash, lesions/masses Respiratory: No cough, dyspnea Urologic: No hematuria, dysuria Abdominal:   No nausea, vomiting, diarrhea, bright red blood per rectum, melena, or hematemesis Neurologic:  No  visual changes, wkns, changes in mental status. All other systems reviewed and are otherwise negative except as noted above.  Physical Exam    Blood pressure (!) 127/57, pulse 70, temperature 97.8 F (36.6 C), temperature source Oral, resp. rate 18, height 5\' 8"  (1.727 m), weight 117.9 kg, SpO2 98 %.  General: Pleasant, obese WF, NAD Psych: Normal affect. Neuro: Alert and oriented X 3. Moves all extremities spontaneously. HEENT: Normal  Neck: Supple without bruits or JVD. Lungs:  Resp regular and unlabored, CTA. Heart: RRR no s3,  s4, or murmurs. Abdomen: Soft, non-tender, non-distended, BS + x 4.  Extremities: No clubbing, cyanosis or edema. DP/PT/Radials 2+ and equal bilaterally.  Labs    Troponin (Point of Care Test) No results for input(s): TROPIPOC in the last 72 hours. No results for input(s): CKTOTAL, CKMB, TROPONINI in the last 72 hours. Lab Results  Component Value Date   WBC 4.6 07/27/2018   HGB 13.6 07/27/2018   HCT 42.6 07/27/2018   MCV 95 07/27/2018   PLT 186 07/27/2018    Recent Labs  Lab 07/27/18 1206  NA 142  K 5.2  CL 104  CO2 24  BUN 21  CREATININE 1.00  CALCIUM 9.1  GLUCOSE 95   Lab Results  Component Value Date   CHOL 198 11/05/2017   HDL 73 11/05/2017   LDLCALC 109 (H) 11/05/2017   TRIG 81 11/05/2017   No results found for: [facility]   Radiology Studies    Ct Angio Chest Pe W Or Wo Contrast  Result Date: 07/22/2018 CLINICAL DATA:  Shortness of breath, weakness EXAM: CT ANGIOGRAPHY CHEST WITH CONTRAST TECHNIQUE: Multidetector CT imaging of the chest was performed using the standard protocol during bolus administration of intravenous contrast. Multiplanar CT image reconstructions and MIPs were obtained to evaluate the vascular anatomy. CONTRAST:  75mL ISOVUE-370 IOPAMIDOL (ISOVUE-370) INJECTION 76% COMPARISON:  10/30/2017 FINDINGS: Cardiovascular: No filling defects in the pulmonary arteries to suggest pulmonary emboli. Heart is enlarged. Aorta is  normal caliber. Mediastinum/Nodes: No mediastinal, hilar, or axillary adenopathy. Lungs/Pleura: No confluent opacities or effusions. Upper Abdomen: Prior cholecystectomy. Low-density area with calcifications in the spleen measures approximately 4 cm, similar to prior study. This is unchanged since 2012. Musculoskeletal: Chest wall soft tissues are unremarkable. No acute bony abnormality. Review of the MIP images confirms the above findings. IMPRESSION: No evidence of pulmonary embolus. Cardiomegaly. No acute cardiopulmonary disease. Electronically Signed   By: Charlett Nose M.D.   On: 07/22/2018 18:19   Ct Coronary Morph W/cta Cor W/score W/ca W/cm &/or Wo/cm  Addendum Date: 07/19/2018   ADDENDUM REPORT: 07/19/2018 18:11 CLINICAL DATA:  72 year old female with h/o hypertension, NICM (LVEF 45 to 50%) GERD, hyperlipidemia with worsening fatigue. EXAM: Cardiac/Coronary  CT TECHNIQUE: The patient was scanned on a [employer]. FINDINGS: A 120 kV prospective scan was triggered in the descending thoracic aorta at 111 HU's. Axial non-contrast 3 mm slices were carried out through the heart. The data set was analyzed on a dedicated work station and scored using the Agatson method. Gantry rotation speed was 250 msecs and collimation was .6 mm. No beta blockade and 0.8 mg of sl NTG was given. The 3D data set was reconstructed in 5% intervals of the 67-82 % of the R-R cycle. Diastolic phases were analyzed on a dedicated work station using MPR, MIP and VRT modes. The patient received 80 cc of contrast. Aorta:  Normal size.  No calcifications.  No dissection. Aortic Valve:  Trileaflet.  No calcifications. Coronary Arteries:  Normal coronary origin.  Right dominance. RCA is a very large dominant artery that gives rise to PDA and PLVB. There is no plaque. Left main is a large artery that gives rise to LAD, ramus intermedius and LCX arteries. Left main has no plaque. LAD is a medium size vessel that has mild calcified  plaque in the proximal portion. LCX is a small non-dominant artery that has no obvious plaque. Other findings: Normal pulmonary vein drainage into the left atrium. Normal let atrial appendage without a thrombus.  IMPRESSION: 1. Coronary calcium score of 9. This was 42 percentile for age and sex matched control. 2. Normal coronary origin with right dominance. 3. This study is affected by motion, however there is only mild non-obstructive CAD. 4. Pulmonary artery is severely dilated measuring 53 mm suggestive of pulmonary hypertension. Electronically Signed   By: Tobias Alexander   On: 07/19/2018 18:11   Result Date: 07/19/2018 EXAM: OVER-READ INTERPRETATION  CT CHEST The following report is an over-read performed by radiologist Dr. Trudie Reed of [facility] on 07/19/2018. This over-read does not include interpretation of cardiac or coronary anatomy or pathology. The coronary calcium score/coronary CTA interpretation by the cardiologist is attached. COMPARISON:  Chest CT 10/30/2017. FINDINGS: Severe dilatation of the pulmonic trunk (5 cm in diameter). Within the visualized portions of the thorax there are no suspicious appearing pulmonary nodules or masses, there is no acute consolidative airspace disease, no pleural effusions, no pneumothorax and no lymphadenopathy. Visualized portions of the upper abdomen are unremarkable. There are no aggressive appearing lytic or blastic lesions noted in the visualized portions of the skeleton. IMPRESSION: 1. Severe dilatation of the pulmonic trunk (5 cm in diameter), concerning for pulmonary arterial hypertension. Electronically Signed: By: Trudie Reed M.D. On: 07/19/2018 12:38    ECG & Cardiac Imaging    EKG: SR with PVCs. 07/27/18  Assessment & Plan    72 yo female with PMH of HTN, ICM (45-50%), GERD, HL and reactive airway disease who presents for RHC from the office.   1. Dyspnea with new systolic HF: Echo in the office with decline in EF to  30- 35%. She was referred for outpatient RHC per Dr. Tenny Craw after review of outpatient CT and echo. Dr. Tresa Endo saw the patient at the bedside and reviewed procedure with patient and family.    2. HTN: blood pressure stable  3. HL: LDL 109  Janice Coffin, NP-C Pager [phone] 07/28/2018, 1:08 PM   Patient seen and examined. Agree with assessment and plan.  Crystal Yoder is a 72 year old female patient of Dr. Dietrich Pates who has a history of cardiomyopathy.  In 2007 she underwent cardiac catheterization by Dr. Gala Romney at which time her EF was 20%.  She was not found to have significant coronary obstructive disease.  Seen by Dr. Tenny Craw recently underwent CT imaging of her chest and CT coronary angio Which demonstrated a calcium score of 9.  Not found to have significant CAD with only very mild plaque.  Her pulmonary artery was severely dilated measuring 53 mm suggestive of pulmonary hypertension.  She is now referred by Dr. Dietrich Pates for a right heart catheterization.  Her ECG yesterday shows sinus rhythm with occasional to frequent PVCs.  Lennette Bihari, MD, [facility] 07/28/2018 1:26 PM

## 2018-07-28 NOTE — Discharge Instructions (Signed)
° °  Right Heart Catheterization, Care After This sheet gives you information about how to care for yourself after your procedure. Your health care provider may also give you more specific instructions. If you have problems or questions, contact your health care provider. What can I expect after the procedure? After the procedure, it is common to have:  Bruising or mild discomfort in the area where the IV was inserted (insertion site).  Follow these instructions at home: Eating and drinking  Follow instructions from your health care provider about eating or drinking restrictions.  Drink a lot of fluids for the first several days after the procedure, as directed by your health care provider. Check your IV insertion area every day for signs of infection. Check for: ? Redness, swelling, or pain. ? Fluid or blood. ? Warmth. ? Pus or a bad smell.  Take over-the-counter and prescription medicines only as told by your health care provider.  Rest and return to your normal activities as told by your health care provider. Ask your health care provider what activities are safe for you.  Do not drive for 24 hours if you were given a medicine to help you relax (sedative), or until your health care provider approves.  Keep all follow-up visits as told by your health care provider. This is important. Contact a health care provider if:  Your skin becomes itchy or you develop a rash or hives.  You have a fever that does not get better with medicine.  You feel nauseous.  You vomit.  You have redness, swelling, or pain around the insertion site.  You have fluid or blood coming from the insertion site.  Your insertion area feels warm to the touch.  You have pus or a bad smell coming from the insertion site. Get help right away if:  You have difficulty breathing or shortness of breath.  You develop chest pain.  You faint.  You feel very dizzy. These symptoms may represent a serious  problem that is an emergency. Do not wait to see if the symptoms will go away. Get medical help right away. Call your local emergency services (911 in the [place] Do not drive yourself to the hospital. Summary  After your procedure, it is common to have bruising or mild discomfort in the area where the IV was inserted.  You should check your IV insertion area every day for signs of infection.  Take over-the-counter and prescription medicines only as told by your health care provider.  You should drink a lot of fluids for the first several days after the procedure to help flush the contrast from your body. This information is not intended to replace advice given to you by your health care provider. Make sure you discuss any questions you have with your health care provider. Document Released: 08/03/2013 Document Revised: 09/06/2016 Document Reviewed: 09/06/2016 Elsevier Interactive Patient Education  2017 [employer].

## 2018-07-28 NOTE — Interval H&P Note (Signed)
History and Physical Interval Note:  07/28/2018 1:30 PM  Crystal Yoder  has presented today for surgery, with the diagnosis of elevated pressures  The various methods of treatment have been discussed with the patient and family. After consideration of risks, benefits and other options for treatment, the patient has consented to  Procedure(s): RIGHT HEART CATH (N/A) as a surgical intervention .  The patient's history has been reviewed, patient examined, no change in status, stable for surgery.  I have reviewed the patient's chart and labs.  Questions were answered to the patient's satisfaction.     Nicki Guadalajara

## 2018-07-29 ENCOUNTER — Encounter (HOSPITAL_COMMUNITY): Payer: Self-pay | Admitting: Cardiovascular Disease

## 2018-07-29 ENCOUNTER — Telehealth: Payer: Self-pay | Admitting: Internal Medicine

## 2018-07-29 DIAGNOSIS — I272 Pulmonary hypertension, unspecified: Secondary | ICD-10-CM

## 2018-07-29 LAB — POCT I-STAT 3, VENOUS BLOOD GAS (G3P V)
ACID-BASE DEFICIT: 2 mmol/L (ref 0.0–2.0)
Acid-base deficit: 1 mmol/L (ref 0.0–2.0)
BICARBONATE: 24.9 mmol/L (ref 20.0–28.0)
Bicarbonate: 24.2 mmol/L (ref 20.0–28.0)
O2 SAT: 73 %
O2 Saturation: 73 %
PH VEN: 7.351 (ref 7.250–7.430)
PO2 VEN: 41 mmHg (ref 32.0–45.0)
TCO2: 25 mmol/L (ref 22–32)
TCO2: 26 mmol/L (ref 22–32)
pCO2, Ven: 43.8 mmHg — ABNORMAL LOW (ref 44.0–60.0)
pCO2, Ven: 45 mmHg (ref 44.0–60.0)
pH, Ven: 7.35 (ref 7.250–7.430)
pO2, Ven: 41 mmHg (ref 32.0–45.0)

## 2018-07-29 MED ORDER — FUROSEMIDE 40 MG PO TABS: 20.0000 mg | ORAL_TABLET | Freq: Every day | ORAL | 3 refills | Status: DC

## 2018-07-29 NOTE — Telephone Encounter (Signed)
Spoke to patient about R heart cath results Reviewed numbers/pressures  REcomm:    Trial of daily lasix 1/2 tab daily  Check BMET and BNP in 10 day Weigh every am  WIll call with results     May increase dose    At some point consider repeat R heart cath to redefine after fluid down  Pt has appt with B Bhagat   Cancel this for now.

## 2018-07-29 NOTE — Telephone Encounter (Signed)
Changed lasix directions in medicine list to reflect Lasix 40 mg --take 1/2 tablet daily Orders for BMET, BNP to be done on 08/09/18. Appointment with B. Iver Nestle has been cancelled. Attempted to call patient several times to confirm all of this with her.  Line busy. Unable to reach her.

## 2018-07-30 NOTE — Telephone Encounter (Signed)
Reviewed with Dr. Tenny Craw who states patient is aware of the med change and to come for the lab work 08/09/18 and that the APP appt was cancelled.

## 2018-08-01 IMAGING — US US ABDOMEN LIMITED
1 series · 14 of 25 positions shown · non-contrast
Comparison: CT of the abdomen and pelvis performed earlier today at
[DATE] a.m.

CLINICAL DATA: Acute onset of right upper quadrant abdominal pain.
Initial encounter.

EXAM:
ULTRASOUND ABDOMEN LIMITED RIGHT UPPER QUADRANT

[Series 1: us abdomen limited · 0.25mm/px · 14 of 34 slices shown]
[im 1/34]
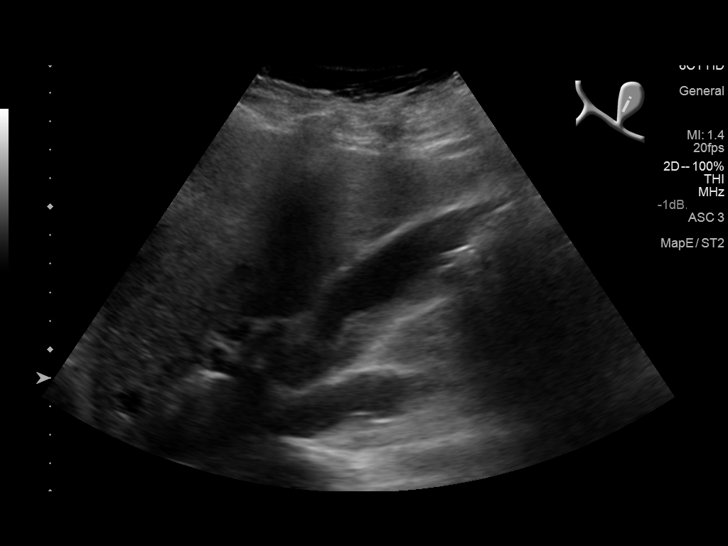
[im 3/34]
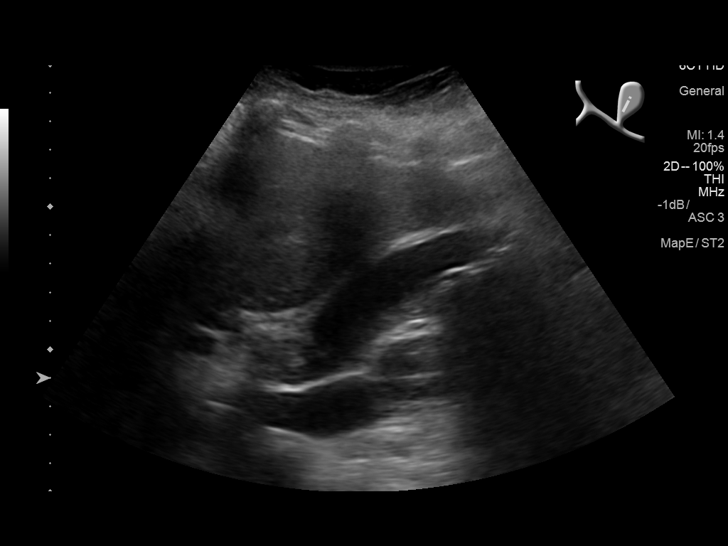
[im 6/34]
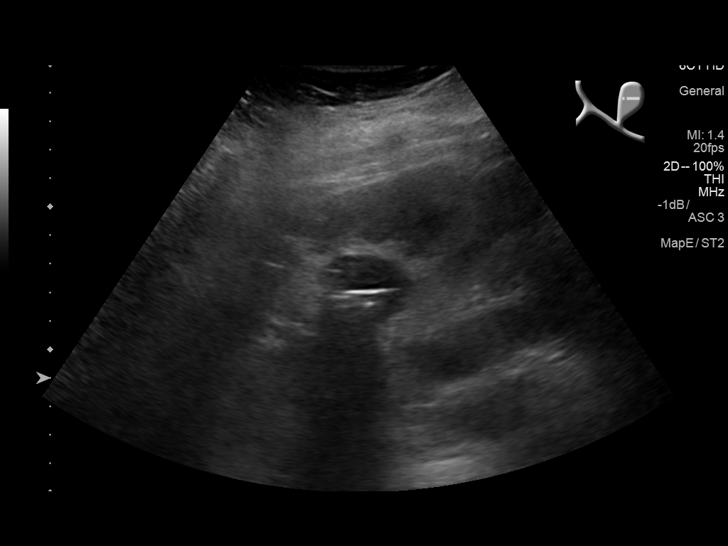
[im 9/34]
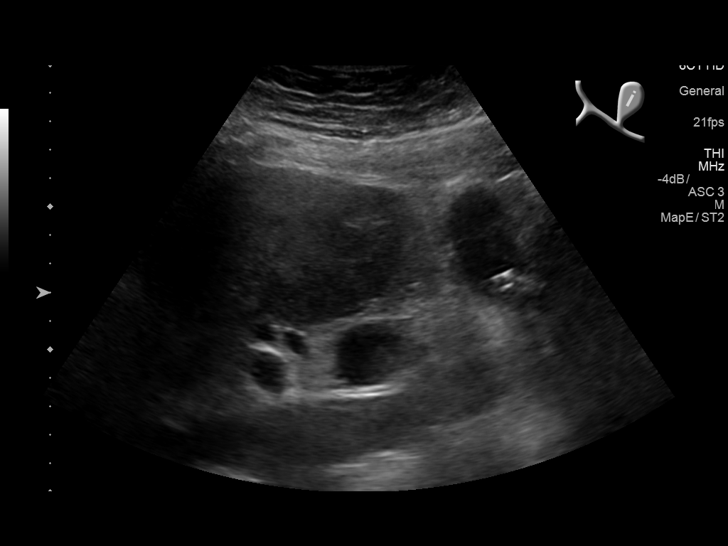
[im 12/34]
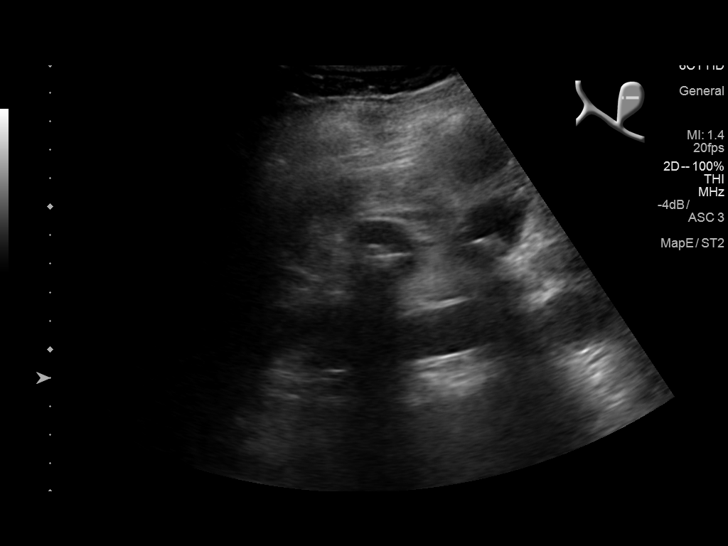
[im 13/34]
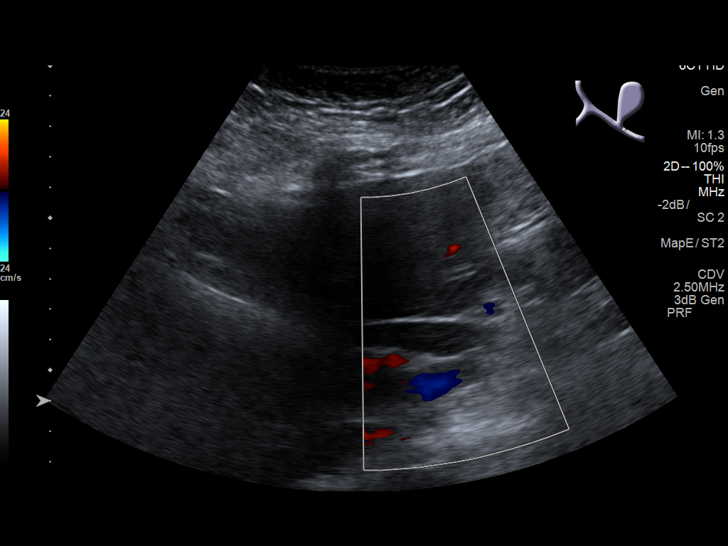
[im 16/34]
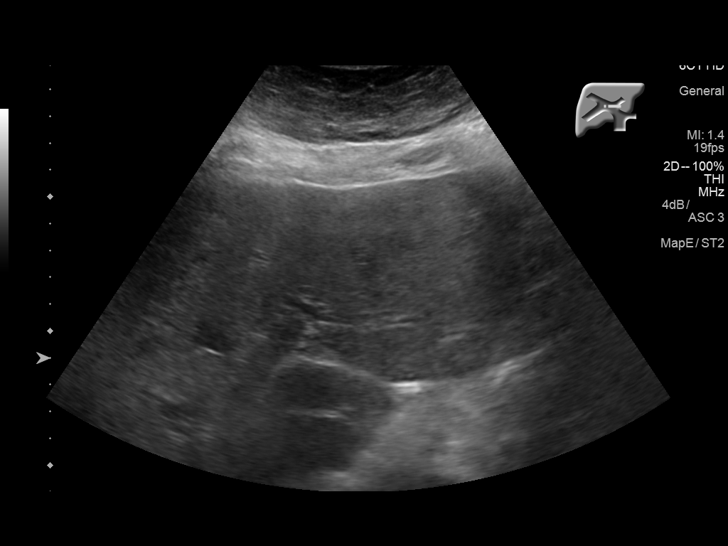
[im 18/34]
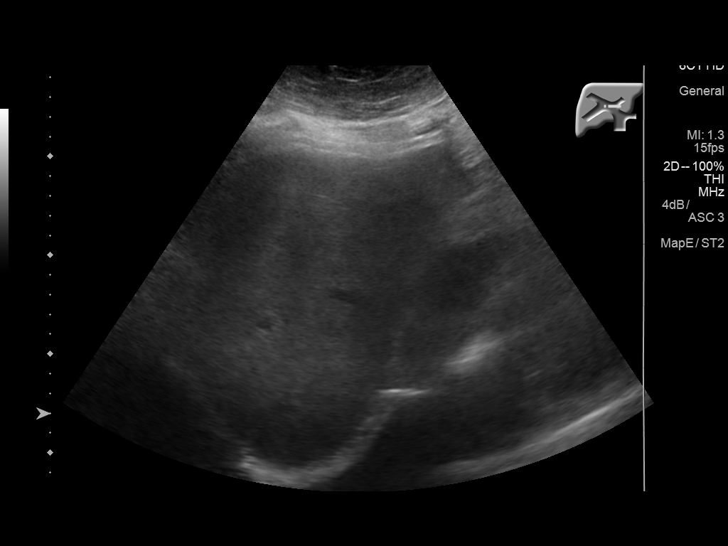
[im 21/34]
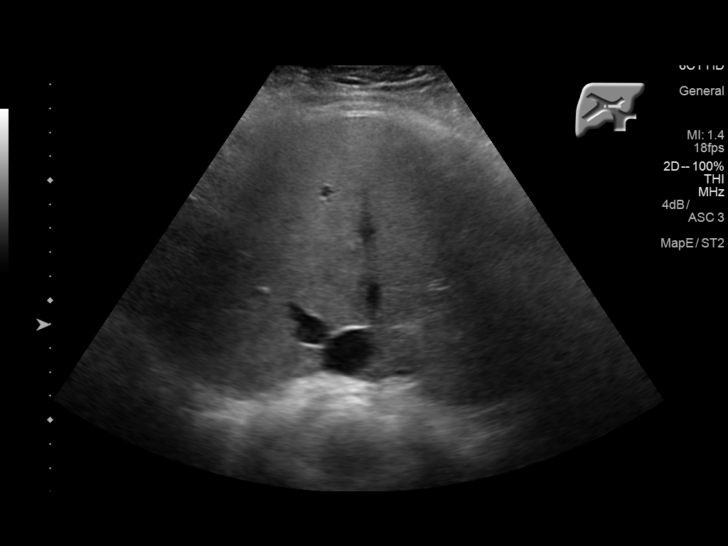
[im 23/34]
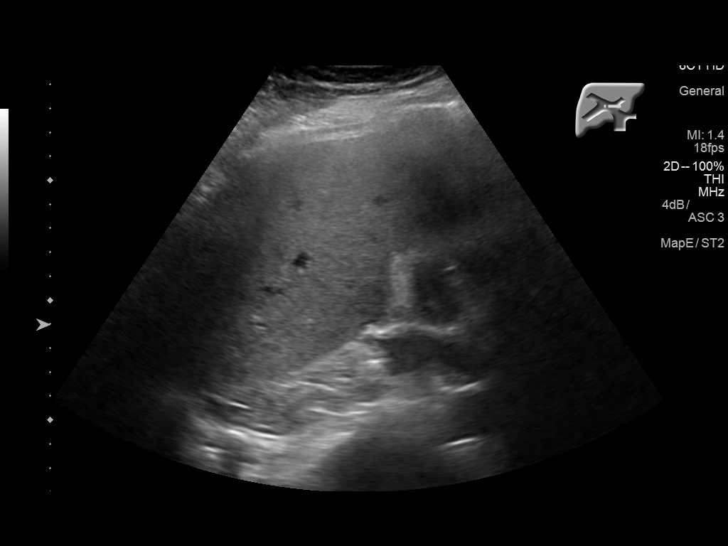
[im 25/34]
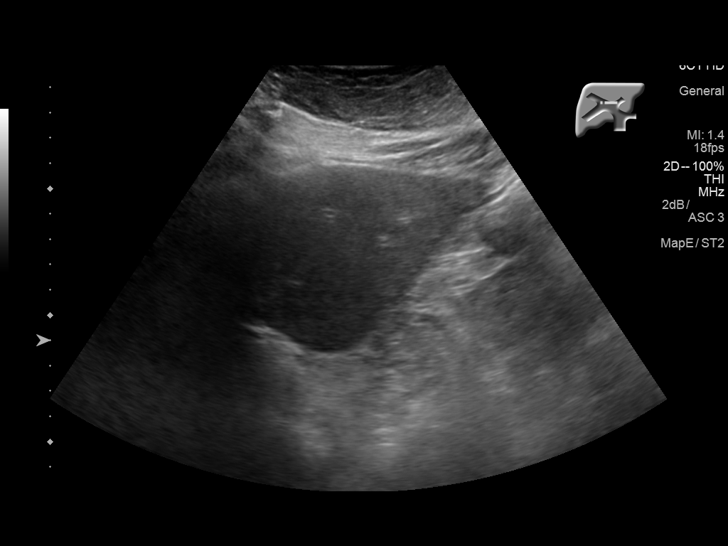
[im 28/34]
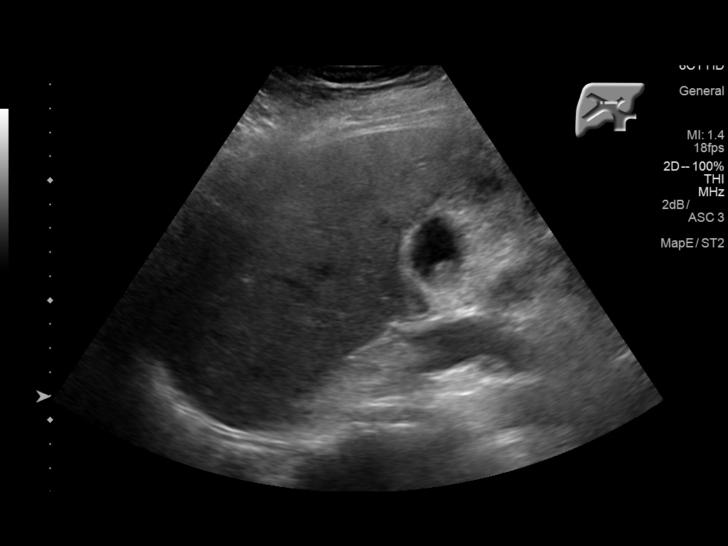
[im 31/34]
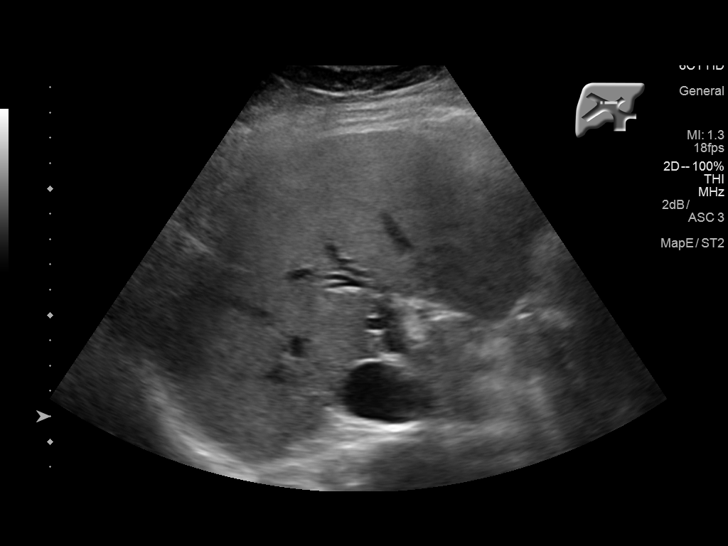
[im 34/34]
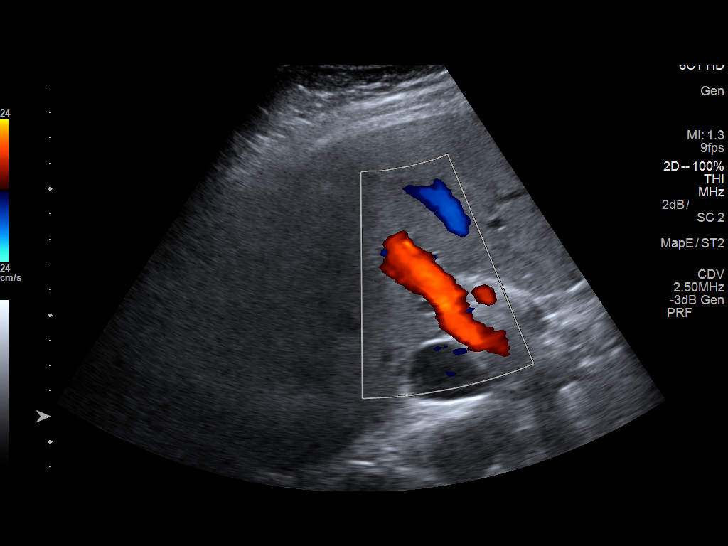

[14 of 25 positions shown; findings below may reference images not displayed]

FINDINGS: Gallbladder:

Stones and sludge are noted within the gallbladder. There is
gallbladder wall thickening, measuring up to 4 mm. No
pericholecystic fluid or ultrasonographic Murphy's sign is elicited.

Common bile duct:

Diameter: 1.2 cm, dilated for the patient's age.

Liver:

No focal lesion identified. Diffusely increased parenchymal
echogenicity and coarsened echotexture, compatible with fatty
infiltration.
IMPRESSION: 1. Dilatation of the common bile duct to 1.2 cm in diameter, raising
concern for distal obstruction. Would correlate with LFTs, and
consider MRCP or ERCP for further evaluation, as deemed clinically
appropriate.
2. Gallbladder wall thickening, with cholelithiasis and sludge in
the gallbladder. This may reflect underlying obstruction, given the
dilated common bile duct. No ultrasonographic Murphy's sign
elicited.
3. Diffuse fatty infiltration within the liver.

## 2018-08-09 ENCOUNTER — Other Ambulatory Visit: Payer: Medicare Other | Admitting: *Deleted

## 2018-08-09 DIAGNOSIS — I272 Pulmonary hypertension, unspecified: Secondary | ICD-10-CM

## 2018-08-09 LAB — BASIC METABOLIC PANEL
BUN / CREAT RATIO: 21 (ref 12–28)
BUN: 24 mg/dL (ref 8–27)
CO2: 23 mmol/L (ref 20–29)
Calcium: 9.2 mg/dL (ref 8.7–10.3)
Chloride: 105 mmol/L (ref 96–106)
Creatinine, Ser: 1.15 mg/dL — ABNORMAL HIGH (ref 0.57–1.00)
GFR calc Af Amer: 55 mL/min/{1.73_m2} — ABNORMAL LOW (ref >59)
GFR calc non Af Amer: 47 mL/min/{1.73_m2} — ABNORMAL LOW (ref >59)
Glucose: 96 mg/dL (ref 65–99)
POTASSIUM: 4.7 mmol/L (ref 3.5–5.2)
SODIUM: 144 mmol/L (ref 134–144)

## 2018-08-09 LAB — PRO B NATRIURETIC PEPTIDE: NT-Pro BNP: 1447 pg/mL — ABNORMAL HIGH (ref 0–301)

## 2018-08-10 ENCOUNTER — Ambulatory Visit: Payer: Medicare Other | Admitting: Physician Assistant

## 2018-08-13 ENCOUNTER — Telehealth: Payer: Self-pay | Admitting: *Deleted

## 2018-08-13 DIAGNOSIS — I5022 Chronic systolic (congestive) heart failure: Secondary | ICD-10-CM

## 2018-08-13 MED ORDER — FUROSEMIDE 40 MG PO TABS: ORAL_TABLET | ORAL | 3 refills | Status: DC

## 2018-08-13 NOTE — Telephone Encounter (Signed)
Notes recorded by Lendon Ka, RN on 08/11/2018 at 6:00 PM EDT Informed patient of results/notes from Dr. Tenny Craw. She will start tomorrow taking 40 mg lasix for 3 days then alternate 40 mg with 20 mg every other day. Lab appointment scheduled for 08/20/18. Reviewed reading labels for sodium and discussed eating out/hidden sodium. __________________________________________________________________  Notes recorded by Pricilla Riffle, MD on 08/11/2018 at 4:42 PM EDT Fluid appears slightly higher than previous Need to increase lasix to 40 daily for 3 days then every other day  Check BMETand BNP in 10 days  Watch salt  2 to 3 grams

## 2018-08-20 ENCOUNTER — Other Ambulatory Visit: Payer: Medicare Other | Admitting: *Deleted

## 2018-08-20 DIAGNOSIS — I5022 Chronic systolic (congestive) heart failure: Secondary | ICD-10-CM

## 2018-08-20 LAB — BASIC METABOLIC PANEL
BUN / CREAT RATIO: 21 (ref 12–28)
BUN: 26 mg/dL (ref 8–27)
CO2: 22 mmol/L (ref 20–29)
Calcium: 9 mg/dL (ref 8.7–10.3)
Chloride: 101 mmol/L (ref 96–106)
Creatinine, Ser: 1.21 mg/dL — ABNORMAL HIGH (ref 0.57–1.00)
GFR calc Af Amer: 51 mL/min/{1.73_m2} — ABNORMAL LOW (ref >59)
GFR, EST NON AFRICAN AMERICAN: 44 mL/min/{1.73_m2} — AB (ref >59)
Glucose: 104 mg/dL — ABNORMAL HIGH (ref 65–99)
POTASSIUM: 4.6 mmol/L (ref 3.5–5.2)
SODIUM: 139 mmol/L (ref 134–144)

## 2018-08-20 LAB — PRO B NATRIURETIC PEPTIDE: NT-Pro BNP: 881 pg/mL — ABNORMAL HIGH (ref 0–301)

## 2018-08-26 ENCOUNTER — Encounter: Payer: Self-pay | Admitting: Physician Assistant

## 2018-09-13 ENCOUNTER — Telehealth: Payer: Self-pay | Admitting: Internal Medicine

## 2018-09-13 NOTE — Telephone Encounter (Signed)
Message sent to scheduling. Pt can be added into hold spot on 09/21/18.

## 2018-09-13 NOTE — Telephone Encounter (Signed)
New message  Per the patient states that she would like to see Dr. Dietrich Pates. She was rescheduled to see Ronie Spies on 10/13/2018 due to a change in provider schedule. It states that she needs to see provider when Dr. Tenny Craw is in the office. Dr. Tenny Craw will not have any openings until next year. Please call to advise patient when she can be worked in to see someone on a day that Dr. Tenny Craw is in the office.

## 2018-09-19 NOTE — Progress Notes (Signed)
Cardiology Office Note   Date:  09/21/2018   ID:  Myonna, Chisom [birth year], MRN [id number]  PCP:  Laurann Montana, MD  Cardiologist:   Dietrich Pates, MD   F/U of NICM     History of Present Illness: Crystal Yoder is a 72 y.o. female with a history of HTN NICM (LVEF 45 to 50%) GERD, HL, reactive airway  This summer she complained if increased dyspnea  EKG with increased PVCs    Adjusted her meds as an outpt   Added lasix     WIth continued symptoms a repeat echo was ordered    LVEF was reported at 30 to 35% with increased fillinpressure   LA was dilated There was mild to mod AS.   CT coronary angio showed coronary calcium score of 9   Normal coronary arteries  The PA was noted to be severely dilated at 53 mm    CT of lungs showed no PE    In October  she underwent R heart cath  To evaluated for pulmonary HTN   Results:  Pressures RA: A-wave 12, V wave 12; mean 10 RV: 59/12 PA: 62/26; mean 37 PW: A-wave 20, V wave 22; mean 18 Repeat PA: 55/25; mean 36  Oxygen saturation in the pulmonary artery was 73% in the AO 96%.  By the Fick method, cardiac output was 7.1 L/min with a cardiac index of 3.1 L/min/m.  PVR: 2.66 WU   REcomm lasix 20 mg daily  Repeat labs on 10/14 BNP was increased   Lasix was increasted to 40 alternating with 20 mg  Since seen she says she has increased difficulty with urinary incontinence, particularly the days she takes 40 mg lasix    She has a pessary in place and has a lot of pressure     Breathing is OK    No CP    Current Meds  Medication Sig  . carvedilol (COREG) 3.125 MG tablet Take 1 tablet (3.125 mg total) by mouth daily.  . Cholecalciferol (VITAMIN D3) 5000 units TABS Take 5,000 Units by mouth once a week.  . furosemide (LASIX) 40 MG tablet Take 40 mg by mouth daily alternating with 20 mg daily  . Multiple Vitamin (MULTIVITAMIN WITH MINERALS) TABS tablet Take 1 tablet by mouth once a week.   . pantoprazole (PROTONIX) 40 MG tablet  Take 40 mg by mouth daily.   . potassium chloride (K-DUR) 10 MEQ tablet Take 1 tablet (10 mEq total) by mouth every other day.  . sacubitril-valsartan (ENTRESTO) 24-26 MG Take 1 tablet by mouth 2 (two) times daily.     Allergies:   Penicillins; Sulfa antibiotics; and Sulfonamide derivatives   Past Medical History:  Diagnosis Date  . Arthritis   . Arthropathy, unspecified, site unspecified   . COPD (chronic obstructive pulmonary disease) (HCC)   . Esophageal reflux   . Essential hypertension 05/31/2017  . Obesity, unspecified   . Obstructive sleep apnea (adult) (pediatric)   . Other acute sinusitis   . Other primary cardiomyopathies   . Psoriasis   . Shortness of breath   . Unspecified venous (peripheral) insufficiency     Past Surgical History:  Procedure Laterality Date  . ABDOMINAL HYSTERECTOMY  2011  . BLADDER SURGERY  2011   tac=ant/post  . CHOLECYSTECTOMY N/A 06/01/2017   Procedure: LAPAROSCOPIC CHOLECYSTECTOMY;  Surgeon: Gaynelle Adu, MD;  Location: [facility];  Service: General;  Laterality: N/A;  . IRRIGATION AND DEBRIDEMENT OF  WOUND WITH SPLIT THICKNESS SKIN GRAFT Right 01/25/2014   Procedure: RIGHT FOOT IRRIGATION AND DEBRIDEMENT WITH ACELL/SPLICK THICKNESS SKINGRAFT WITH VAC;  Surgeon: Wayland Denis, DO;  Location: [facility];  Service: Plastics;  Laterality: Right;  . PELVIC FLOOR REPAIR    . REPLACEMENT TOTAL KNEE     rt and lt  . RIGHT HEART CATH N/A 07/28/2018   Procedure: RIGHT HEART CATH;  Surgeon: Lennette Bihari, MD;  Location: [facility] INVASIVE CV LAB;  Service: Cardiovascular;  Laterality: N/A;  . 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 Her smoking use included cigarettes. She has a 5.00 pack-year smoking history. She has never used smokeless tobacco. She reports that she drinks alcohol. She reports that she does not use drugs.   Family History:  The patient's family  history includes Diabetes in her paternal grandfather; Diverticulitis in her father; Prostate cancer in her father.    ROS:  Please see the history of present illness. All other systems are reviewed and  Negative to the above problem except as noted.    PHYSICAL EXAM: VS:  BP 104/78   Pulse 70   Ht 5\' 8"  (1.727 m)   Wt 246 lb 12.8 oz (111.9 kg)   SpO2 96%   BMI 37.53 kg/m   GEN: Morbidly obese 72 yo in no acute distress  HEENT: normal  Neck: JVP is not elevated   No, carotid bruits, or masses Cardiac: RRR; no murmurs, rubs, or gallops,Trivial LE edema  Respiratory:  clear to auscultation bilaterally, normal work of breathing GI: soft, nontender, nondistended, + BS  No hepatomegaly  MS: no deformity Moving all extremities   Skin: Chronic skin changes   Neuro:  Strength and sensation are intact Psych: euthymic mood, full affect   EKG:  EKG is not ordered  Lipid Panel    Component Value Date/Time   CHOL 198 11/05/2017 1220   TRIG 81 11/05/2017 1220   TRIG 75 10/23/2006 0812   HDL 73 11/05/2017 1220   CHOLHDL 2.7 11/05/2017 1220   CHOLHDL 3 10/31/2013 1440   VLDL 8.4 10/31/2013 1440   LDLCALC 109 (H) 11/05/2017 1220   LDLDIRECT 121.5 10/31/2013 1440      Wt Readings from Last 3 Encounters:  09/21/18 246 lb 12.8 oz (111.9 kg)  07/28/18 260 lb (117.9 kg)  06/18/18 244 lb 6.4 oz (110.9 kg)      ASSESSMENT AND PLAN:  1   NICM  Pt has difficulty with current lasix dosing   Will back down to 20 mg daily    Discussed fluid AND salt restriction   Will check labs    WIll need to reassess LVEF in several montsh   Consideration for other therapeutic options   2  HTN  BP is OK  3  Lipids   LDL 109 in Jan     4  PVCs     Will need to have repat holter to reasses  PVC burden  5  Psychoscocial   Pt appears less stressed   House is under contract       Signed, Dietrich Pates, MD  09/21/2018 11:21 AM    [facility] [address] [place], [place], [place]   [address] Phone: [phone]; Fax: [phone]

## 2018-09-20 ENCOUNTER — Ambulatory Visit: Payer: Medicare Other | Admitting: Physician Assistant

## 2018-09-21 ENCOUNTER — Ambulatory Visit: Payer: Medicare Other | Admitting: Internal Medicine

## 2018-09-21 ENCOUNTER — Encounter: Payer: Self-pay | Admitting: Internal Medicine

## 2018-09-21 VITALS — BP 104/78 | HR 70 | Ht 68.0 in | Wt 246.8 lb

## 2018-09-21 DIAGNOSIS — I10 Essential (primary) hypertension: Secondary | ICD-10-CM

## 2018-09-21 DIAGNOSIS — I493 Ventricular premature depolarization: Secondary | ICD-10-CM | POA: Diagnosis not present

## 2018-09-21 DIAGNOSIS — I5022 Chronic systolic (congestive) heart failure: Secondary | ICD-10-CM

## 2018-09-21 DIAGNOSIS — E782 Mixed hyperlipidemia: Secondary | ICD-10-CM

## 2018-09-21 MED ORDER — FUROSEMIDE 20 MG PO TABS: 20.0000 mg | ORAL_TABLET | Freq: Every day | ORAL | 3 refills | Status: DC

## 2018-09-21 NOTE — Patient Instructions (Signed)
Medication Instructions:  Your physician has recommended you make the following change in your medication:  1.) decrease lasix (furosemide) to 20 mg once a day   If you need a refill on your cardiac medications before your next appointment, please call your pharmacy.   Lab work: Today: BMET, BNP  If you have labs (blood work) drawn today and your tests are completely normal, you will receive your results only by: Marland Kitchen MyChart Message (if you have MyChart) OR . A paper copy in the mail If you have any lab test that is abnormal or we need to change your treatment, we will call you to review the results.  Testing/Procedures: none  Follow-Up: Your physician recommends that you schedule a follow-up appointment in: Feb-Mar, 2020   Any Other Special Instructions Will Be Listed Below (If Applicable).  Low-Sodium Eating Plan Sodium, which is an element that makes up salt, helps you maintain a healthy balance of fluids in your body. Too much sodium can increase your blood pressure and cause fluid and waste to be held in your body. Your health care provider or dietitian may recommend following this plan if you have high blood pressure (hypertension), kidney disease, liver disease, or heart failure. Eating less sodium can help lower your blood pressure, reduce swelling, and protect your heart, liver, and kidneys. What are tips for following this plan? General guidelines  Most people on this plan should limit their sodium intake to 1,500-2,000 mg (milligrams) of sodium each day. Reading food labels  The Nutrition Facts label lists the amount of sodium in one serving of the food. If you eat more than one serving, you must multiply the listed amount of sodium by the number of servings.  Choose foods with less than 140 mg of sodium per serving.  Avoid foods with 300 mg of sodium or more per serving. Shopping  Look for lower-sodium products, often labeled as "low-sodium" or "no salt  added."  Always check the sodium content even if foods are labeled as "unsalted" or "no salt added".  Buy fresh foods. ? Avoid canned foods and premade or frozen meals. ? Avoid canned, cured, or processed meats  Buy breads that have less than 80 mg of sodium per slice. Cooking  Eat more home-cooked food and less restaurant, buffet, and fast food.  Avoid adding salt when cooking. Use salt-free seasonings or herbs instead of table salt or sea salt. Check with your health care provider or pharmacist before using salt substitutes.  Cook with plant-based oils, such as canola, sunflower, or olive oil. Meal planning  When eating at a restaurant, ask that your food be prepared with less salt or no salt, if possible.  Avoid foods that contain MSG (monosodium glutamate). MSG is sometimes added to [place] food, bouillon, and some canned foods. What foods are recommended? The items listed may not be a complete list. Talk with your dietitian about what dietary choices are best for you. Grains Low-sodium cereals, including oats, puffed wheat and rice, and shredded wheat. Low-sodium crackers. Unsalted rice. Unsalted pasta. Low-sodium bread. Whole-grain breads and whole-grain pasta. Vegetables Fresh or frozen vegetables. "No salt added" canned vegetables. "No salt added" tomato sauce and paste. Low-sodium or reduced-sodium tomato and vegetable juice. Fruits Fresh, frozen, or canned fruit. Fruit juice. Meats and other protein foods Fresh or frozen (no salt added) meat, poultry, seafood, and fish. Low-sodium canned tuna and salmon. Unsalted nuts. Dried peas, beans, and lentils without added salt. Unsalted canned beans. Eggs. Unsalted nut  butters. Dairy Milk. Soy milk. Cheese that is naturally low in sodium, such as ricotta cheese, fresh mozzarella, or Swiss cheese Low-sodium or reduced-sodium cheese. Cream cheese. Yogurt. Fats and oils Unsalted butter. Unsalted margarine with no trans fat. Vegetable  oils such as canola or olive oils. Seasonings and other foods Fresh and dried herbs and spices. Salt-free seasonings. Low-sodium mustard and ketchup. Sodium-free salad dressing. Sodium-free light mayonnaise. Fresh or refrigerated horseradish. Lemon juice. Vinegar. Homemade, reduced-sodium, or low-sodium soups. Unsalted popcorn and pretzels. Low-salt or salt-free chips. What foods are not recommended? The items listed may not be a complete list. Talk with your dietitian about what dietary choices are best for you. Grains Instant hot cereals. Bread stuffing, pancake, and biscuit mixes. Croutons. Seasoned rice or pasta mixes. Noodle soup cups. Boxed or frozen macaroni and cheese. Regular salted crackers. Self-rising flour. Vegetables Sauerkraut, pickled vegetables, and relishes. Olives. [place] fries. Onion rings. Regular canned vegetables (not low-sodium or reduced-sodium). Regular canned tomato sauce and paste (not low-sodium or reduced-sodium). Regular tomato and vegetable juice (not low-sodium or reduced-sodium). Frozen vegetables in sauces. Meats and other protein foods Meat or fish that is salted, canned, smoked, spiced, or pickled. Bacon, ham, sausage, hotdogs, corned beef, chipped beef, packaged lunch meats, salt pork, jerky, pickled herring, anchovies, regular canned tuna, sardines, salted nuts. Dairy Processed cheese and cheese spreads. Cheese curds. Blue cheese. Feta cheese. String cheese. Regular cottage cheese. Buttermilk. Canned milk. Fats and oils Salted butter. Regular margarine. Ghee. Bacon fat. Seasonings and other foods Onion salt, garlic salt, seasoned salt, table salt, and sea salt. Canned and packaged gravies. Worcestershire sauce. Tartar sauce. Barbecue sauce. Teriyaki sauce. Soy sauce, including reduced-sodium. Steak sauce. Fish sauce. Oyster sauce. Cocktail sauce. Horseradish that you find on the shelf. Regular ketchup and mustard. Meat flavorings and tenderizers. Bouillon cubes.  Hot sauce and Tabasco sauce. Premade or packaged marinades. Premade or packaged taco seasonings. Relishes. Regular salad dressings. Salsa. Potato and tortilla chips. Corn chips and puffs. Salted popcorn and pretzels. Canned or dried soups. Pizza. Frozen entrees and pot pies. Summary  Eating less sodium can help lower your blood pressure, reduce swelling, and protect your heart, liver, and kidneys.  Most people on this plan should limit their sodium intake to 1,500-2,000 mg (milligrams) of sodium each day.  Canned, boxed, and frozen foods are high in sodium. Restaurant foods, fast foods, and pizza are also very high in sodium. You also get sodium by adding salt to food.  Try to cook at home, eat more fresh fruits and vegetables, and eat less fast food, canned, processed, or prepared foods. This information is not intended to replace advice given to you by your health care provider. Make sure you discuss any questions you have with your health care provider. Document Released: 04/04/2002 Document Revised: 10/06/2016 Document Reviewed: 10/06/2016 Elsevier Interactive Patient Education  [employer].

## 2018-09-22 ENCOUNTER — Other Ambulatory Visit: Payer: Self-pay | Admitting: *Deleted

## 2018-09-22 DIAGNOSIS — I272 Pulmonary hypertension, unspecified: Secondary | ICD-10-CM

## 2018-09-22 LAB — BASIC METABOLIC PANEL
BUN/Creatinine Ratio: 25 (ref 12–28)
BUN: 26 mg/dL (ref 8–27)
CALCIUM: 9.2 mg/dL (ref 8.7–10.3)
CO2: 25 mmol/L (ref 20–29)
Chloride: 100 mmol/L (ref 96–106)
Creatinine, Ser: 1.02 mg/dL — ABNORMAL HIGH (ref 0.57–1.00)
GFR calc Af Amer: 63 mL/min/{1.73_m2} (ref >59)
GFR calc non Af Amer: 55 mL/min/{1.73_m2} — ABNORMAL LOW (ref >59)
GLUCOSE: 70 mg/dL (ref 65–99)
Potassium: 4.8 mmol/L (ref 3.5–5.2)
Sodium: 141 mmol/L (ref 134–144)

## 2018-09-22 LAB — PRO B NATRIURETIC PEPTIDE: NT-PRO BNP: 1170 pg/mL — AB (ref 0–301)

## 2018-10-01 ENCOUNTER — Other Ambulatory Visit: Payer: Self-pay | Admitting: Family Medicine

## 2018-10-01 DIAGNOSIS — Z1231 Encounter for screening mammogram for malignant neoplasm of breast: Secondary | ICD-10-CM

## 2018-10-13 ENCOUNTER — Ambulatory Visit: Payer: Medicare Other | Admitting: Physician Assistant

## 2018-10-13 ENCOUNTER — Encounter

## 2018-10-15 ENCOUNTER — Ambulatory Visit
Admission: RE | Admit: 2018-10-15 | Discharge: 2018-10-15 | Disposition: A | Discharge status: Home / Self Care | Payer: Medicare Other | Source: Ambulatory Visit | Attending: Family Medicine | Admitting: Family Medicine

## 2018-10-15 DIAGNOSIS — Z1231 Encounter for screening mammogram for malignant neoplasm of breast: Secondary | ICD-10-CM

## 2018-11-10 ENCOUNTER — Other Ambulatory Visit (HOSPITAL_COMMUNITY): Payer: Medicare Other

## 2018-11-12 ENCOUNTER — Ambulatory Visit: Payer: Medicare Other

## 2018-12-08 ENCOUNTER — Ambulatory Visit (HOSPITAL_COMMUNITY): Payer: Medicare Other | Attending: Cardiology

## 2018-12-08 DIAGNOSIS — I272 Pulmonary hypertension, unspecified: Secondary | ICD-10-CM | POA: Diagnosis not present

## 2018-12-21 ENCOUNTER — Other Ambulatory Visit: Payer: Self-pay | Admitting: *Deleted

## 2018-12-21 DIAGNOSIS — I493 Ventricular premature depolarization: Secondary | ICD-10-CM

## 2019-01-03 ENCOUNTER — Telehealth: Payer: Self-pay | Admitting: Internal Medicine

## 2019-01-03 NOTE — Telephone Encounter (Signed)
Spoke with the pt and who was inquiring about her appt on 3/20 and 3/23 Informed pt that she would need to still come to both appts. Pt verbalized understanding.

## 2019-01-03 NOTE — Telephone Encounter (Signed)
New Message:       Pt said she needs to talk to [facility] about her Holter Monitor appt on 01-14-19  and office visit on 01-17-19 please.

## 2019-01-12 ENCOUNTER — Telehealth: Payer: Self-pay | Admitting: Internal Medicine

## 2019-01-12 NOTE — Telephone Encounter (Signed)
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

## 2019-01-12 NOTE — Telephone Encounter (Signed)
Cancelled appointment for holter and follow up appointment. Routed to r/s pool to plan for next appointment.

## 2019-01-17 ENCOUNTER — Ambulatory Visit: Payer: Medicare Other | Admitting: Internal Medicine

## 2019-02-11 ENCOUNTER — Telehealth: Payer: Self-pay

## 2019-02-11 NOTE — Telephone Encounter (Signed)
    Virtual Visit Pre-Appointment Phone Call  Steps For Call:  1. Confirm consent - "In the setting of the current Covid19 crisis, you are scheduled for a VIDEO visit with your provider on 4/23 at 3pm.  Just as we do with many in-office visits, in order for you to participate in this visit, we must obtain consent.  If you'd like, I can send this to your mychart (if signed up) or email for you to review.  Otherwise, I can obtain your verbal consent now.  All virtual visits are billed to your insurance company just like a normal visit would be.  By agreeing to a virtual visit, we'd like you to understand that the technology does not allow for your provider to perform an examination, and thus may limit your provider's ability to fully assess your condition. If your provider identifies any concerns that need to be evaluated in person, we will make arrangements to do so.  Finally, though the technology is pretty good, we cannot assure that it will always work on either your or our end, and in the setting of a video visit, we may have to convert it to a phone-only visit.  In either situation, we cannot ensure that we have a secure connection.  Are you willing to proceed?" STAFF: Did the patient verbally acknowledge consent to telehealth visit? Document YES/NO here: YES  2. Confirm the BEST phone number to call the day of the visit by including in appointment notes  3. Give patient instructions for WebEx/MyChart download to smartphone as below or [email or web address] if video visit (depending on what platform provider is using)  4. Advise patient to be prepared with their blood pressure, heart rate, weight, any heart rhythm information, their current medicines, and a piece of paper and pen handy for any instructions they may receive the day of their visit  5. Inform patient they will receive a phone call 15 minutes prior to their appointment time (may be from unknown caller ID) so they should be prepared to  answer  6. Confirm that appointment type is correct in Epic appointment notes (VIDEO vs PHONE)     TELEPHONE CALL NOTE  Crystal Yoder has been deemed a candidate for a follow-up tele-health visit to limit community exposure during the Covid-19 pandemic. I spoke with the patient via phone to ensure availability of phone/video source, confirm preferred email & phone number, and discuss instructions and expectations.  I reminded AMALIA WEBB to be prepared with any vital sign and/or heart rhythm information that could potentially be obtained via home monitoring, at the time of her visit. I reminded JANYIAH SHURE to expect a phone call at the time of her visit if her visit.  Leanord Hawking, RN 02/11/2019 2:52 PM   .

## 2019-02-17 ENCOUNTER — Encounter: Payer: Self-pay | Admitting: Internal Medicine

## 2019-02-17 ENCOUNTER — Telehealth (INDEPENDENT_AMBULATORY_CARE_PROVIDER_SITE_OTHER): Payer: Medicare Other | Admitting: Internal Medicine

## 2019-02-17 ENCOUNTER — Other Ambulatory Visit: Payer: Self-pay

## 2019-02-17 ENCOUNTER — Telehealth: Payer: Self-pay | Admitting: Internal Medicine

## 2019-02-17 VITALS — Ht 68.0 in | Wt 246.0 lb

## 2019-02-17 DIAGNOSIS — I5043 Acute on chronic combined systolic (congestive) and diastolic (congestive) heart failure: Secondary | ICD-10-CM

## 2019-02-17 DIAGNOSIS — I493 Ventricular premature depolarization: Secondary | ICD-10-CM | POA: Diagnosis not present

## 2019-02-17 DIAGNOSIS — E782 Mixed hyperlipidemia: Secondary | ICD-10-CM | POA: Diagnosis not present

## 2019-02-17 DIAGNOSIS — I10 Essential (primary) hypertension: Secondary | ICD-10-CM | POA: Diagnosis not present

## 2019-02-17 MED ORDER — SACUBITRIL-VALSARTAN 24-26 MG PO TABS: 1.0000 | ORAL_TABLET | Freq: Two times a day (BID) | ORAL | 3 refills | Status: DC

## 2019-02-17 NOTE — Telephone Encounter (Signed)
Virtual visit completed.

## 2019-02-17 NOTE — Telephone Encounter (Signed)
New Message   Patient is calling because she would need to do the appointment today via video on her phone. She can not find the speaker to attach to her computer.
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
## 2019-02-17 NOTE — Patient Instructions (Signed)
Medication Instructions:  No changes If you need a refill on your cardiac medications before your next appointment, please call your pharmacy.   Lab work: none If you have labs (blood work) drawn today and your tests are completely normal, you will receive your results only by: Marland Kitchen MyChart Message (if you have MyChart) OR . A paper copy in the mail If you have any lab test that is abnormal or we need to change your treatment, we will call you to review the results.  Testing/Procedures: Your physician has recommended that you wear a holter monitor. Holter monitors are medical devices that record the heart's electrical activity. Doctors most often use these monitors to diagnose arrhythmias. Arrhythmias are problems with the speed or rhythm of the heartbeat. The monitor is a small, portable device. You can wear one while you do your normal daily activities. This is usually used to diagnose what is causing palpitations/syncope (passing out).   Follow-Up: At [facility], you and your health needs are our priority.  As part of our continuing mission to provide you with exceptional heart care, we have created designated [employer].  These Care Teams include your primary Cardiologist (physician) and Advanced Practice Providers (APPs -  Physician Assistants and Nurse Practitioners) who all work together to provide you with the care you need, when you need it. You will need a follow up appointment in:  August, 2020--4 months.  Please call our office 2 months in advance to schedule this appointment.  You may see Dietrich Pates, MD or one of the following Advanced Practice Providers on your designated Care Team: Tereso Newcomer, PA-C [place], [place] . Berton Bon, NP  Any Other Special Instructions Will Be Listed Below (If Applicable).

## 2019-03-08 NOTE — Addendum Note (Signed)
Addended by: Lendon Ka on: 03/08/2019 12:44 PM   Modules accepted: Orders

## 2019-04-21 ENCOUNTER — Telehealth: Payer: Self-pay | Admitting: Internal Medicine

## 2019-04-21 NOTE — Telephone Encounter (Signed)
I spoke to the patient who is having CP and pressure under the left breast.  It is intermittent and she has been under a lot of stress lately.  She said that it is not bad enough for the ED, but would like to be evaluated.    TELEPHONE CALL NOTE  This patient has been deemed a candidate for follow-up tele-health visit to limit community exposure during the Covid-19 pandemic. I spoke with the patient via phone to discuss instructions.  A Virtual Office Visit appointment type has been scheduled for 6/26 with Richardson Dopp, PA, with "VIDEO" or "TELEPHONE" in the appointment notes - patient prefers Video type.   Frederik Schmidt, RN 04/21/2019 3:03 PM  Patient has consented to a Virtual Visit by Video with Richardson Dopp, [place] 6/26.

## 2019-04-21 NOTE — Telephone Encounter (Signed)
Pt c/o of Chest Pain: STAT if CP now or developed within 24 hours  1. Are you having CP right now? yes  2. Are you experiencing any other symptoms (ex. SOB, nausea, vomiting, sweating)? fatigue  3. How long have you been experiencing CP? 2-3 days  4. Is your CP continuous or coming and going? Coming and going, especially after bending over   5. Have you taken Nitroglycerin? No. Patient does not have any  Patient also is experiencing a heavy feeling more on the left side on her body. She also feels tightness in her chest.  Patient also states she is under a lot of stress.  ?
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
## 2019-04-22 ENCOUNTER — Telehealth (INDEPENDENT_AMBULATORY_CARE_PROVIDER_SITE_OTHER): Payer: Medicare Other | Admitting: Physician Assistant

## 2019-04-22 ENCOUNTER — Telehealth: Payer: Self-pay | Admitting: Radiology

## 2019-04-22 ENCOUNTER — Other Ambulatory Visit: Payer: Self-pay

## 2019-04-22 ENCOUNTER — Telehealth: Payer: Self-pay | Admitting: *Deleted

## 2019-04-22 ENCOUNTER — Encounter: Payer: Self-pay | Admitting: Physician Assistant

## 2019-04-22 ENCOUNTER — Telehealth: Payer: Self-pay | Admitting: Physician Assistant

## 2019-04-22 ENCOUNTER — Telehealth: Payer: Self-pay | Admitting: Internal Medicine

## 2019-04-22 VITALS — Ht 68.0 in | Wt 256.0 lb

## 2019-04-22 DIAGNOSIS — I493 Ventricular premature depolarization: Secondary | ICD-10-CM

## 2019-04-22 DIAGNOSIS — R0789 Other chest pain: Secondary | ICD-10-CM

## 2019-04-22 DIAGNOSIS — I272 Pulmonary hypertension, unspecified: Secondary | ICD-10-CM

## 2019-04-22 DIAGNOSIS — I10 Essential (primary) hypertension: Secondary | ICD-10-CM

## 2019-04-22 DIAGNOSIS — Z7189 Other specified counseling: Secondary | ICD-10-CM

## 2019-04-22 DIAGNOSIS — I5022 Chronic systolic (congestive) heart failure: Secondary | ICD-10-CM

## 2019-04-22 NOTE — Patient Instructions (Addendum)
Medication Instructions:   Increase Lasix to 40 mg daily x3 days, then resume 20 mg daily  Increase potassium to 20 mEq daily x3 days, then resume 10 mEq daily   If you need a refill on your cardiac medications before your next appointment, please call your pharmacy.   Lab work: home visit requested for BMET, BNP, blood pressure check and physical exam patient will be notified by Home visit Nurse Practioner   If you have labs (blood work) drawn today and your tests are completely normal, you will receive your results only by: Marland Kitchen MyChart Message (if you have MyChart) OR . A paper copy in the mail If you have any lab test that is abnormal or we need to change your treatment, we will call you to review the results.  Testing/Procedures: DEPARTMENT CONTACTED ABOUT holter monitor. Holter monitors are medical devices that record the heart's electrical activity. Doctors most often use these monitors to diagnose arrhythmias. Arrhythmias are problems with the speed or rhythm of the heartbeat. The monitor is a small, portable device. You can wear one while you do your normal daily activities. This is usually used to diagnose what is causing palpitations/syncope (passing out).   Follow-Up: At [facility], you and your health needs are our priority.  As part of our continuing mission to provide you with exceptional heart care, we have created designated Provider Care Teams.  These Care Teams include your primary Cardiologist (physician) and Advanced Practice Providers (APPs -  Physician Assistants and Nurse Practitioners) who all work together to provide you with the care you need, when you need it. You will need a follow up appointment in:  1-2  weeks.  You may see Dorris Carnes, MD or one of the following Advanced Practice Providers on your designated Care Team: Richardson Dopp, PA-C [place], [place] . Daune Perch, NP  Any Other Special Instructions Will Be Listed Below (If Applicable).

## 2019-04-22 NOTE — Telephone Encounter (Signed)
Reviewed visit withAPP THis may be pt to add on to virtual call next week

## 2019-04-22 NOTE — Telephone Encounter (Signed)
Enrolled patient for a 3 Day Zio monitor to be mailed. Brief instructions were gone over with patient and she knows to expect the monitor to arrive in 3-4 days. Note: 24hr Holter was changed to 3 Day Zio so monitor could be mailed

## 2019-04-22 NOTE — Telephone Encounter (Signed)
[place] Visit Initial Request  Date of Request (Chrisman):  April 22, 2019  Requesting Provider:  Richardson Dopp PA     Agency Requested:    [facility] Contact:  Glory Buff, NP [address] [place], [place] [address] Phone #:  [phone] Fax #:  [phone]  Patient Demographic Information: Name:  Crystal Yoder Age:  73 y.o.   DOB:  07/13/1946  MRN:  [id number]   Address:   [address] [place] [address]   Phone Numbers:   Home Phone [phone]  Mobile [phone]     Emergency Contact Information on File:   Contact Information    Name Relation Home Work [place] L Spouse [phone]  [phone]      The above family members may be contacted for information on this patient (review DPR on file):  Yes    Patient Clinical Information:  Primary Care Provider:  Harlan Stains, MD  Primary Cardiologist:  Dorris Carnes, MD  Primary Electrophysiologist:  None   Past Medical Hx: Crystal Yoder  has a past medical history of Arthritis, Arthropathy, unspecified, site unspecified, COPD (chronic obstructive pulmonary disease) ([place]), Esophageal reflux, Essential hypertension (05/31/2017), Obesity, unspecified, Obstructive sleep apnea (adult) (pediatric), Other acute sinusitis, Other primary cardiomyopathies, Psoriasis, Shortness of breath, and Unspecified venous (peripheral) insufficiency.   Allergies: She is allergic to penicillins; sulfa antibiotics; and sulfonamide derivatives.   Medications: Current Outpatient Medications on File Prior to Visit  Medication Sig  . carvedilol (COREG) 3.125 MG tablet Take 1 tablet (3.125 mg total) by mouth daily.  . furosemide (LASIX) 20 MG tablet Take 1 tablet (20 mg total) by mouth daily.  . pantoprazole (PROTONIX) 40 MG tablet Take 40 mg by mouth daily.   . potassium chloride (K-DUR) 10 MEQ tablet Take 10 mEq by mouth daily.  . sacubitril-valsartan (ENTRESTO) 24-26 MG Take 1  tablet by mouth 2 (two) times daily.   No current facility-administered medications on file prior to visit.      Social Hx: She  reports that she quit smoking about 39 years ago. Her smoking use included cigarettes. She has a 5.00 pack-year smoking history. She has never used smokeless tobacco. She reports current alcohol use. She reports that she does not use drugs.    Diagnosis/Reason for Visit:   CHF/CP/HTN  Services Requested: Physical Exam and Labs BMET /BNP  # of Visits Needed/Frequency per Week: Once on 04-25-19  or 04-26-19   A copy of the office note will be faxed with this form. All labs ordered for this home visit have been released and the request was sent to Chrissie Noa at [facility].

## 2019-04-22 NOTE — Telephone Encounter (Signed)
New message    B/p readings: 04/22/2019 within a 5 minute period    94/84  Hr 51  106/94   112/53  Hr 70   Patient has questions a bout doubling furosemide (LASIX) 20 MG tablet. Please call.

## 2019-04-25 NOTE — Telephone Encounter (Signed)
Lvm for patient to call clinic back about blood pressure readings from Friday

## 2019-04-25 NOTE — Telephone Encounter (Signed)
Please contact patient about her concerns. One of these BP readings is a little low but the others are ok. If she is symptomatic (lightheaded, dizzy, weak) she can resume her usual dose of Lasix. Remind patient that she was to only take double dose of Lasix for 3 days total and then resume her usual dose. Richardson Dopp, PA-C    04/25/2019 1:45 PM

## 2019-04-26 ENCOUNTER — Ambulatory Visit (INDEPENDENT_AMBULATORY_CARE_PROVIDER_SITE_OTHER): Payer: Medicare Other

## 2019-04-26 ENCOUNTER — Telehealth: Payer: Self-pay

## 2019-04-26 ENCOUNTER — Other Ambulatory Visit: Payer: Medicare Other

## 2019-04-26 ENCOUNTER — Other Ambulatory Visit: Payer: Self-pay

## 2019-04-26 ENCOUNTER — Telehealth: Payer: Self-pay | Admitting: *Deleted

## 2019-04-26 DIAGNOSIS — I5022 Chronic systolic (congestive) heart failure: Secondary | ICD-10-CM

## 2019-04-26 DIAGNOSIS — R0602 Shortness of breath: Secondary | ICD-10-CM

## 2019-04-26 DIAGNOSIS — I493 Ventricular premature depolarization: Secondary | ICD-10-CM | POA: Diagnosis not present

## 2019-04-26 NOTE — Telephone Encounter (Signed)
Patient is coming today 6/30 to have monitor applied

## 2019-04-26 NOTE — Telephone Encounter (Signed)
Pt reports tired, not sleeping well. Crying throughout call. Since moving can't find anything she needs.  i've got issues and they just can't be addressed over the phone.  " I need to be seen", does not want to double lasix because--  The plumbers are in and out and she cannot get into the bathroom as needed.-  Causes frequency/incontinence  There is a hot water leak causing high water and [employer] bills.  Causing more stress.   She was happy to realize/remember that has an in office visit with SW on 05/04/19. Has received the heart monitor but it is overwhelming to try to put on herself.  Wants to wait until 7/8 visit and have it applied then. Adv I would ask the monitor coordinator/tech to contact her to explain how to apply it.  She would like a call tomorrow around 11:00 am if possible.  She will plan for this unless she hears otherwise.

## 2019-04-26 NOTE — Telephone Encounter (Signed)
The patient came into the office for her monitor to be placed and requested to have her labs done. Will swap the labs over. Spoke with the Home health agency and cancelled her lab draw tomorrow.

## 2019-04-26 NOTE — Telephone Encounter (Signed)
    COVID-19 Pre-Screening Questions:  . In the past 7 to 10 days have you had a cough,  shortness of breath, headache, congestion, fever (100 or greater) body aches, chills, sore throat, or sudden loss of taste or sense of smell? . Have you been around anyone with known Covid 19. . Have you been around anyone who is awaiting Covid 19 test results in the past 7 to 10 days? . Have you been around anyone who has been exposed to Covid 19, or has mentioned symptoms of Covid 19 within the past 7 to 10 days?  If you have any concerns/questions about symptoms patients report during screening (either on the phone or at threshold). Contact the provider seeing the patient or DOD for further guidance.  If neither are available contact a member of the leadership team.   Patient answered no to above questions.  Patient aware not to arrive sooner than 15 min prior to her appointment time.  Patient aware only she will be allowed to come up to 3rd floor.  Patient asked to bring and wear a facemask during her appointment.

## 2019-04-27 ENCOUNTER — Telehealth: Payer: Self-pay | Admitting: Internal Medicine

## 2019-04-27 LAB — BASIC METABOLIC PANEL
BUN/Creatinine Ratio: 27 (ref 12–28)
BUN: 26 mg/dL (ref 8–27)
CO2: 23 mmol/L (ref 20–29)
Calcium: 9.1 mg/dL (ref 8.7–10.3)
Chloride: 107 mmol/L — ABNORMAL HIGH (ref 96–106)
Creatinine, Ser: 0.95 mg/dL (ref 0.57–1.00)
GFR calc Af Amer: 69 mL/min/{1.73_m2} (ref >59)
GFR calc non Af Amer: 60 mL/min/{1.73_m2} (ref >59)
Glucose: 111 mg/dL — ABNORMAL HIGH (ref 65–99)
Potassium: 4.8 mmol/L (ref 3.5–5.2)
Sodium: 143 mmol/L (ref 134–144)

## 2019-04-27 LAB — PRO B NATRIURETIC PEPTIDE: NT-Pro BNP: 528 pg/mL — ABNORMAL HIGH (ref 0–301)

## 2019-04-27 NOTE — Telephone Encounter (Signed)
New message:     Patient calling asking can her lab fax over to Dr. Dema Severin at [facility] so she will not have to do labs again. Patient appt is Monday 05/03/19.
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
## 2019-05-04 ENCOUNTER — Ambulatory Visit (INDEPENDENT_AMBULATORY_CARE_PROVIDER_SITE_OTHER): Payer: Medicare Other | Admitting: Physician Assistant

## 2019-05-04 ENCOUNTER — Other Ambulatory Visit: Payer: Self-pay

## 2019-05-04 ENCOUNTER — Encounter: Payer: Self-pay | Admitting: Physician Assistant

## 2019-05-04 VITALS — BP 102/72 | HR 59 | Ht 68.0 in | Wt 257.0 lb

## 2019-05-04 DIAGNOSIS — I493 Ventricular premature depolarization: Secondary | ICD-10-CM | POA: Diagnosis not present

## 2019-05-04 DIAGNOSIS — I5022 Chronic systolic (congestive) heart failure: Secondary | ICD-10-CM | POA: Diagnosis not present

## 2019-05-04 DIAGNOSIS — I272 Pulmonary hypertension, unspecified: Secondary | ICD-10-CM | POA: Diagnosis not present

## 2019-05-04 DIAGNOSIS — I10 Essential (primary) hypertension: Secondary | ICD-10-CM

## 2019-05-04 NOTE — Patient Instructions (Signed)
Medication Instructions:  No changes.  Continue current medications.  If you need a refill on your cardiac medications before your next appointment, please call your pharmacy.   Lab work: None needed.  If you have labs (blood work) drawn today and your tests are completely normal, you will receive your results only by: Marland Kitchen MyChart Message (if you have MyChart) OR . A paper copy in the mail If you have any lab test that is abnormal or we need to change your treatment, we will call you to review the results.  Testing/Procedures: No testing needed.   Follow-Up: At [facility], you and your health needs are our priority.  As part of our continuing mission to provide you with exceptional heart care, we have created designated Provider Care Teams.  These Care Teams include your primary Cardiologist (physician) and Advanced Practice Providers (APPs -  Physician Assistants and Nurse Practitioners) who all work together to provide you with the care you need, when you need it. . Dr. Harrington Challenger August 24 as scheduled  Any Other Special Instructions Will Be Listed Below (If Applicable).  Check your blood pressure often If you see BP readings less than 161 systolic (top number) on a regular basis, let us know.

## 2019-06-19 NOTE — Progress Notes (Addendum)
Cardiology Office Note   Date:  06/20/2019   ID:  Marley, Bobbitt 08/29/1946, MRN [id number]  PCP:  Laurann Montana, MD  Cardiologist:   Dietrich Pates, MD   F/U of NICM     History of Present Illness: DZYRE CAZENAVE is a 73 y.o. female with a history of HTN NICM (LVEF 45 to 50%) GERD, HL, reactive airway  Last summer  summer she complained if increased dyspnea  EKG with increased PVCs (31%)   Repeat echo showed LVEF was t 30 to 35% with increased filling pressures   LA was dilated There was mild to mod AS.   CT coronary angio showed coronary calcium score of 9   Normal coronary arteries  The PA was noted to be severely dilated at 53 mm    CT of lungs showed no PE  In Oct 2019 she underwent R heart cath  To evaluated for pulmonary HTN   Results:  Pressures RA: A-wave 12, V wave 12; mean 10 RV: 59/12 PA: 62/26; mean 37 PW: A-wave 20, V wave 22; mean 18 Repeat PA: 55/25; mean 36  Oxygen saturation in the pulmonary artery was 73% in the AO 96%.  By the Fick method, cardiac output was 7.1 L/min with a cardiac index of 3.1 L/min/m.  PVR: 2.66 WU   REcomm lasix 20 mg daily  Repeat labs on 10/14 BNP was increased   Lasix was increasted to 40 alternating with 20 mg  I saw the pt in clinic in  Nov 2019  Repeat echo 11/2018:  LVEF 35 to 40% and a televisit in June 2020 She has also been seen by Wende Mott, first in June (tele) then in person (July 2020)  Repeat monitor ordered to reevaluate PVC burden This showed SR    Average HR 66   PVCs were 7% total, improved from previous  Since July she has done OK   Life is hectic  She is moving homes.   Just got back from beach  Very relaxed while their    Admits to not exercising(foot limits)  She says she is getting weaker   Breathing is OK   Denies CP   No palpitations   Triv edema    Current Meds  Medication Sig  . carvedilol (COREG) 3.125 MG tablet Take 1 tablet (3.125 mg total) by mouth daily.  . furosemide (LASIX) 20 MG tablet Take  1 tablet (20 mg total) by mouth daily.  . pantoprazole (PROTONIX) 40 MG tablet Take 40 mg by mouth daily.   . potassium chloride (K-DUR) 10 MEQ tablet Take 10 mEq by mouth daily.  . sacubitril-valsartan (ENTRESTO) 24-26 MG Take 1 tablet by mouth 2 (two) times daily.     Allergies:   Penicillins, Sulfa antibiotics, and Sulfonamide derivatives   Past Medical History:  Diagnosis Date  . Arthritis   . Arthropathy, unspecified, site unspecified   . COPD (chronic obstructive pulmonary disease) (HCC)   . Esophageal reflux   . Essential hypertension 05/31/2017  . Obesity, unspecified   . Obstructive sleep apnea (adult) (pediatric)   . Other acute sinusitis   . Other primary cardiomyopathies   . Psoriasis   . Shortness of breath   . Unspecified venous (peripheral) insufficiency     Past Surgical History:  Procedure Laterality Date  . ABDOMINAL HYSTERECTOMY  2011  . BLADDER SURGERY  2011   tac=ant/post  . CHOLECYSTECTOMY N/A 06/01/2017   Procedure: LAPAROSCOPIC CHOLECYSTECTOMY;  Surgeon: Greer Pickerel, MD;  Location: [facility];  Service: General;  Laterality: N/A;  . IRRIGATION AND DEBRIDEMENT OF WOUND WITH SPLIT THICKNESS SKIN GRAFT Right 01/25/2014   Procedure: RIGHT FOOT IRRIGATION AND DEBRIDEMENT WITH ACELL/SPLICK THICKNESS SKINGRAFT WITH VAC;  Surgeon: Theodoro Kos, DO;  Location: [facility];  Service: Plastics;  Laterality: Right;  . PELVIC FLOOR REPAIR    . REPLACEMENT TOTAL KNEE     rt and lt  . RIGHT HEART CATH N/A 07/28/2018   Procedure: RIGHT HEART CATH;  Surgeon: Troy Sine, MD;  Location: [facility];  Service: Cardiovascular;  Laterality: N/A;  . 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     Social History:  The patient  reports that she quit smoking about 39 years ago. Her smoking use included cigarettes. She has a 5.00 pack-year smoking history. She has never used smokeless tobacco. She reports current alcohol use. She reports that she does not use  drugs.   Family History:  The patient's family history includes Diabetes in her paternal grandfather; Diverticulitis in her father; Prostate cancer in her father.    ROS:  Please see the history of present illness. All other systems are reviewed and  Negative to the above problem except as noted.    PHYSICAL EXAM: VS:  BP 128/76   Pulse 70   Ht 5\' 8"  (1.727 m)   Wt 263 lb 6.4 oz (119.5 kg)   SpO2 97%   BMI 40.05 kg/m   GEN: Morbidly obese 73 yo in no acute distress  HEENT: normal  Neck: JVP is normao   No, carotid bruits, Cardiac: RRR; no murmurs, rubs, or gallops,Tr LE edema (chronic statis changes) Respiratory:  clear to auscultation bilaterally, normal work of breathing GI: soft, nontender, nondistended, + BS  No hepatomegaly  MS: no deformity Moving all extremities   Skin: Chronic skin changes   Neuro:  Strength and sensation are intact Psych: euthymic mood, full affect   EKG:  EKG is not ordered  Lipid Panel    Component Value Date/Time   CHOL 198 11/05/2017 1220   TRIG 81 11/05/2017 1220   TRIG 75 10/23/2006 0812   HDL 73 11/05/2017 1220   CHOLHDL 2.7 11/05/2017 1220   CHOLHDL 3 10/31/2013 1440   VLDL 8.4 10/31/2013 1440   LDLCALC 109 (H) 11/05/2017 1220   LDLDIRECT 121.5 10/31/2013 1440      Wt Readings from Last 3 Encounters:  06/20/19 263 lb 6.4 oz (119.5 kg)  05/04/19 257 lb (116.6 kg)  04/22/19 256 lb (116.1 kg)      ASSESSMENT AND PLAN:  1   NICM  I would keep on current dose of lasix  Volume does not appear bad (diffiuclt exam due to size) and she is not complaining of SOB I will review echos, consider repeat limited to reassess LVEF  2  HTN  BP is OK  3  Lipids   LDL 109 in January   4  PVCs   Down from previous   Keep on same regimen   5  Psychoscocial   Pt appears less stressed today than on previous visits     F?U in clinic in 4 months    Signed, Dorris Carnes, MD  06/20/2019 3:55 PM    [address], [place], [place]  [address] Phone: [phone]; Fax: [phone]

## 2019-06-20 ENCOUNTER — Other Ambulatory Visit: Payer: Self-pay

## 2019-06-20 ENCOUNTER — Encounter: Payer: Self-pay | Admitting: Internal Medicine

## 2019-06-20 ENCOUNTER — Ambulatory Visit (INDEPENDENT_AMBULATORY_CARE_PROVIDER_SITE_OTHER): Payer: Medicare Other | Admitting: Internal Medicine

## 2019-06-20 ENCOUNTER — Encounter

## 2019-06-20 VITALS — BP 128/76 | HR 70 | Ht 68.0 in | Wt 263.4 lb

## 2019-06-20 DIAGNOSIS — I10 Essential (primary) hypertension: Secondary | ICD-10-CM

## 2019-06-20 DIAGNOSIS — I493 Ventricular premature depolarization: Secondary | ICD-10-CM | POA: Diagnosis not present

## 2019-06-20 DIAGNOSIS — I428 Other cardiomyopathies: Secondary | ICD-10-CM

## 2019-06-20 MED ORDER — POTASSIUM CHLORIDE ER 10 MEQ PO TBCR: 10.0000 meq | EXTENDED_RELEASE_TABLET | ORAL | 3 refills | Status: DC

## 2019-06-20 MED ORDER — POTASSIUM CHLORIDE ER 10 MEQ PO TBCR: 10.0000 meq | EXTENDED_RELEASE_TABLET | Freq: Every day | ORAL | 3 refills | Status: DC

## 2019-06-20 NOTE — Patient Instructions (Signed)
Medication Instructions:  Your physician has recommended you make the following change in your medication:  1.) decrease potassium chloride 10 meq to [place]  If you need a refill on your cardiac medications before your next appointment, please call your pharmacy.   Lab work: none If you have labs (blood work) drawn today and your tests are completely normal, you will receive your results only by: Marland Kitchen MyChart Message (if you have MyChart) OR . A paper copy in the mail If you have any lab test that is abnormal or we need to change your treatment, we will call you to review the results.  Testing/Procedures: none  Follow-Up: At [facility], you and your health needs are our priority.  As part of our continuing mission to provide you with exceptional heart care, we have created designated Provider Care Teams.  These Care Teams include your primary Cardiologist (physician) and Advanced Practice Providers (APPs -  Physician Assistants and Nurse Practitioners) who all work together to provide you with the care you need, when you need it. You will need a follow up appointment in:  4 months.  Please call our office 2 months in advance to schedule this appointment.  You may see Dorris Carnes, MD or one of the following Advanced Practice Providers on your designated Care Team: Richardson Dopp, PA-C [place], [place] . Daune Perch, NP  Any Other Special Instructions Will Be Listed Below (If Applicable).

## 2019-07-16 ENCOUNTER — Other Ambulatory Visit: Payer: Self-pay | Admitting: Internal Medicine

## 2019-08-09 ENCOUNTER — Other Ambulatory Visit: Payer: Self-pay | Admitting: Family Medicine

## 2019-08-09 ENCOUNTER — Ambulatory Visit
Admission: RE | Admit: 2019-08-09 | Discharge: 2019-08-09 | Disposition: A | Discharge status: Home / Self Care | Payer: Medicare Other | Source: Ambulatory Visit | Attending: Family Medicine | Admitting: Family Medicine

## 2019-08-09 DIAGNOSIS — R1084 Generalized abdominal pain: Secondary | ICD-10-CM

## 2019-08-23 ENCOUNTER — Other Ambulatory Visit: Payer: Self-pay | Admitting: Internal Medicine

## 2019-08-31 ENCOUNTER — Encounter (HOSPITAL_COMMUNITY): Payer: Self-pay | Admitting: Emergency Medicine

## 2019-08-31 ENCOUNTER — Ambulatory Visit (HOSPITAL_COMMUNITY)
Admission: EM | Admit: 2019-08-31 | Discharge: 2019-08-31 | Disposition: A | Discharge status: Home / Self Care | Payer: Medicare Other | Attending: Family Medicine | Admitting: Family Medicine

## 2019-08-31 ENCOUNTER — Other Ambulatory Visit: Payer: Self-pay

## 2019-08-31 ENCOUNTER — Ambulatory Visit (INDEPENDENT_AMBULATORY_CARE_PROVIDER_SITE_OTHER): Payer: Medicare Other

## 2019-08-31 ENCOUNTER — Telehealth: Payer: Self-pay | Admitting: Internal Medicine

## 2019-08-31 DIAGNOSIS — R0789 Other chest pain: Secondary | ICD-10-CM | POA: Insufficient documentation

## 2019-08-31 DIAGNOSIS — R0602 Shortness of breath: Secondary | ICD-10-CM | POA: Diagnosis not present

## 2019-08-31 DIAGNOSIS — R600 Localized edema: Secondary | ICD-10-CM

## 2019-08-31 LAB — BASIC METABOLIC PANEL
Anion gap: 10 (ref 5–15)
BUN: 18 mg/dL (ref 8–23)
CO2: 25 mmol/L (ref 22–32)
Calcium: 9 mg/dL (ref 8.9–10.3)
Chloride: 107 mmol/L (ref 98–111)
Creatinine, Ser: 0.95 mg/dL (ref 0.44–1.00)
GFR calc Af Amer: >60 mL/min (ref >60)
GFR calc non Af Amer: 59 mL/min — ABNORMAL LOW (ref >60)
Glucose, Bld: 83 mg/dL (ref 70–99)
Potassium: 4.9 mmol/L (ref 3.5–5.1)
Sodium: 142 mmol/L (ref 135–145)

## 2019-08-31 LAB — BRAIN NATRIURETIC PEPTIDE: B Natriuretic Peptide: 155.1 pg/mL — ABNORMAL HIGH (ref 0.0–100.0)

## 2019-08-31 NOTE — ED Triage Notes (Addendum)
Pt reports centralized pain with deep breaths and bending over, and generalized chest tightness all over that started about two days ago.  Pt spoke with her cardiologist and they told her they do not think it is cardiovascular related.  Pt admits to being under a lot of stress at the moment and does suffer from allergies this time of year, every year.  After further conversation with the pt it is noted that she has +3 edema in her LLE and +1 in her right.  Pt has not had any of her medications today and she did not mention the swelling in her lower extremities to her cardiologist.

## 2019-08-31 NOTE — ED Notes (Signed)
Patient has pain with deep inspiration.  Spoke to patient about ED option and [facility].  Patient has chose to stay at [facility]

## 2019-08-31 NOTE — Discharge Instructions (Addendum)
Begin increasing your dose of Lasix to 20mg  daily for the next few days.  We will call you with any significant abnormalities of the blood tests drawn today.

## 2019-08-31 NOTE — Telephone Encounter (Signed)
Pt c/o of Chest Pain: STAT if CP now or developed within 24 hours  1. Are you having CP right now? Yes chest tightness  2. Are you experiencing any other symptoms (ex. SOB, nausea, vomiting, sweating)? SOB  3. How long have you been experiencing CP? 2 nights ago  4. Is your CP continuous or coming and going? Coming and going  5. Have you taken Nitroglycerin? No, states she does not have any ?

## 2019-08-31 NOTE — Telephone Encounter (Signed)
Couple of nights ago, chest tightness and pain.   Tightness is from right to left side.  Feeling it at rest and not worse with ambulating.  SOB at times but not related to this discomfort.  Feels across left and right side.  Feels worse when takes a deep breath and pushes on her chest.  I adv her to contact primary care to try to be seen today, or go to urgent care or the ER.  She remains concerned that this is related to her heart and she is not sure what to do.  I told her all the recommendations are okay but that she should be seen today.    If ER go to [facility].  I encouraged her to speak with primary care doctor first.  She may call her lung doctor's office.

## 2019-09-01 NOTE — Telephone Encounter (Signed)
Follow up   Patient is calling because she did go to the Urgent Care and they said that she had edema in her leg. She said they told that she needed to see cardiology. She wants to see Dr. Harrington Challenger but she does not have anything. Please contact patient

## 2019-09-01 NOTE — Telephone Encounter (Signed)
Called patient back.  She is tearful on the phone about her chest hurting when she moves around.  She also has a cough and using an inhaler.   Reports to me that she is "really stressed".  She is unsure what dose of furosemide she has been taking.  She has a bottle of 20 mg and a bottle of 40 mg and she has been taking half tab daily but not sure from which bottle.  She was seen at urgent care yesterday.  "they weren't overly concerned" about her chest hurting  and she told to double up on lasix for a few days and make an appointment with Dr. Harrington Challenger because of her symptoms which include edema in one lower extremity - foot only  She took a whole lasix pill today but does not know the dose.  I have scheduled her with Maryla Morrow, NP on 09/02/19.

## 2019-09-02 ENCOUNTER — Ambulatory Visit (INDEPENDENT_AMBULATORY_CARE_PROVIDER_SITE_OTHER): Payer: Medicare Other | Admitting: Cardiology

## 2019-09-02 ENCOUNTER — Other Ambulatory Visit: Payer: Self-pay

## 2019-09-02 ENCOUNTER — Encounter: Payer: Self-pay | Admitting: Cardiology

## 2019-09-02 VITALS — BP 126/70 | HR 76 | Ht 68.0 in | Wt 262.0 lb

## 2019-09-02 DIAGNOSIS — I493 Ventricular premature depolarization: Secondary | ICD-10-CM

## 2019-09-02 DIAGNOSIS — I10 Essential (primary) hypertension: Secondary | ICD-10-CM | POA: Diagnosis not present

## 2019-09-02 DIAGNOSIS — M7989 Other specified soft tissue disorders: Secondary | ICD-10-CM | POA: Diagnosis not present

## 2019-09-02 DIAGNOSIS — I428 Other cardiomyopathies: Secondary | ICD-10-CM

## 2019-09-02 MED ORDER — FUROSEMIDE 20 MG PO TABS: 20.0000 mg | ORAL_TABLET | Freq: Every day | ORAL | 3 refills | Status: DC

## 2019-09-02 NOTE — Patient Instructions (Addendum)
Medication Instructions:  Take 20 mg of lasix daily alternating with 40 mg daily   *If you need a refill on your cardiac medications before your next appointment, please call your pharmacy*  Lab Work: BMET ON 09/30/2019   If you have labs (blood work) drawn today and your tests are completely normal, you will receive your results only by: Marland Kitchen MyChart Message (if you have MyChart) OR . A paper copy in the mail If you have any lab test that is abnormal or we need to change your treatment, we will call you to review the results.  Testing/Procedures: Your physician has requested that you have a lower or upper extremity venous duplex. This test is an ultrasound of the veins in the legs or arms. It looks at venous blood flow that carries blood from the heart to the legs or arms. Allow one hour for a Lower Venous exam. Allow thirty minutes for an Upper Venous exam. There are no restrictions or special instructions.     Follow-Up: You are scheduled to see Pecolia Ades, NP on 09/30/2019 @ 11:30 AM   You are scheduled to see Dr. Harrington Challenger on 10/10/2019 @ 9:40 AM   Other Instructions

## 2019-09-02 NOTE — Progress Notes (Signed)
Cardiology Office Note:    Date:  09/02/2019   ID:  Crystal Yoder, DOB 09/16/1946, MRN [id number]  PCP:  Laurann MontanaWhite, Cynthia, MD  Cardiologist:  Dietrich PatesPaula Ross, MD  Referring MD: Laurann MontanaWhite, Cynthia, MD   Chief Complaint  Patient presents with  . Edema    History of Present Illness:    Crystal Yoder is a 73 y.o. female with a past medical history significant for HTN NICM (LVEF 45 to 50%) GERD, HL, reactive airway.  She was seen last summer by Dr. Tenny Crawoss for evaluation of increased dyspnea.  EKG with increased PVCs (31%).   Repeat echo showed LVEF was  30 to 35% with increased filling pressures   LA was dilated. There was mild to mod AS.  CT coronary angio showed coronary calcium score of 9 and Normal coronary arteries.  The PA was noted to be severely dilated at 53 mm.   CT of lungs showed no PE.  In Oct 2019 she underwent R heart cath  To evaluated for pulmonary HTN   Results:  Pressures RA: A-wave 12, V wave 12; mean 10 RV: 59/12 PA: 62/26; mean 37 PW: A-wave 20, V wave 22; mean 18 Repeat PA: 55/25; mean 36  Oxygen saturation in the pulmonary artery was 73% in the AO 96%.  By the Fick method, cardiac output was 7.1 L/min with a cardiac index of 3.1 L/min/m.  PVR: 2.66 WU   Recommended lasix 20 mg daily  Repeat labs on 10/14 BNP was increased.   Lasix was increasted to 40 alternating with 20 mg  She was seen again by Dr. Tenny Crawoss in  Nov 2019.  Repeat echo 11/2018:  LVEF 35 to 40% and a televisit in June 2020 She has also been seen by Wende MottS Weaver, first in June (tele) then in person (July 2020).  Repeat monitor ordered to reevaluate PVC burden which showed SR, Average HR 66, PVCs were 7% total, improved from previous.  She was last seen in the office on 06/20/2019 by Dr. Tenny Crawoss at which time she was doing okay.  She was under some life stressors and was moving.  She also was not exercising, limited by foot pain.  She denied palpitations.  Volume status was stable on exam and Lasix was  continued.  Patient was seen at urgent care on 08/31/2019 for complaints of chest pain that occurred with deep inspiration.  Apparently there was little concern about her chest discomfort.  EKG was without ischemia.  BNP was 155.1 and she was told to take extra Lasix for lower extremity swelling  Crystal Yoder is here today for follow-up.  She says that at some point her Lasix had been decreased due to frequent urination.  She had 2 bottles of Lasix one was 20 mg and one was 40 mg.  She was cutting a pill in half but she did not know which bottle she was using.  She has significant left lower leg edema which is not common for her.  She has no leg pain.  She is having some chest tightness with associated shortness of breath mostly when bending over, coughing or laughing.  She is having no chest tightness today.  She denies orthopnea.  She says that she went to the beach last week and was eating lots of salty foods and snacks.  Of note her weight on 8/20 office visit was 263.  Today her weight is 262.  She says that she has been under a lot of stress  with a recent move and taking care of her husband who has Alzheimer's.  She has not been taking care of herself or paying much attention to her medications or diet.     Cardiac studies   See HPI  Past Medical History:  Diagnosis Date  . Arthritis   . Arthropathy, unspecified, site unspecified   . COPD (chronic obstructive pulmonary disease) (HCC)   . Esophageal reflux   . Essential hypertension 05/31/2017  . Obesity, unspecified   . Obstructive sleep apnea (adult) (pediatric)   . Other acute sinusitis   . Other primary cardiomyopathies   . Psoriasis   . Shortness of breath   . Unspecified venous (peripheral) insufficiency     Past Surgical History:  Procedure Laterality Date  . ABDOMINAL HYSTERECTOMY  2011  . BLADDER SURGERY  2011   tac=ant/post  . CHOLECYSTECTOMY N/A 06/01/2017   Procedure: LAPAROSCOPIC CHOLECYSTECTOMY;  Surgeon: Gaynelle Adu,  MD;  Location: [facility];  Service: General;  Laterality: N/A;  . IRRIGATION AND DEBRIDEMENT OF WOUND WITH SPLIT THICKNESS SKIN GRAFT Right 01/25/2014   Procedure: RIGHT FOOT IRRIGATION AND DEBRIDEMENT WITH ACELL/SPLICK THICKNESS SKINGRAFT WITH VAC;  Surgeon: Wayland Denis, DO;  Location: [facility];  Service: Plastics;  Laterality: Right;  . PELVIC FLOOR REPAIR    . REPLACEMENT TOTAL KNEE     rt and lt  . RIGHT HEART CATH N/A 07/28/2018   Procedure: RIGHT HEART CATH;  Surgeon: Lennette Bihari, MD;  Location: [facility] INVASIVE CV LAB;  Service: Cardiovascular;  Laterality: N/A;  . 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 carvedilol (COREG) 3.125 MG tablet TAKE 1 TABLET BY MOUTH DAILY  . furosemide (LASIX) 20 MG tablet Take 1-2 tablets (20-40 mg total) by mouth daily.  . pantoprazole (PROTONIX) 40 MG tablet Take 40 mg by mouth daily.   . potassium chloride (K-DUR) 10 MEQ tablet Take 1 tablet (10 mEq total) by mouth every other day.  . sacubitril-valsartan (ENTRESTO) 24-26 MG Take 1 tablet by mouth 2 (two) times daily.  . [DISCONTINUED] furosemide (LASIX) 20 MG tablet Take 1 tablet (20 mg total) by mouth daily.  . [DISCONTINUED] furosemide (LASIX) 40 MG tablet Take 40 mg by mouth.     Allergies:   Penicillins, Sulfa antibiotics, and Sulfonamide derivatives   Social History   Socioeconomic History  . Marital status: Married    Spouse name: Not on file  . Number of children: Not on file  . Years of education: Not on file  . Highest education level: Not on file  Occupational History  . Occupation: Engineer, site  Social Needs  . Financial resource strain: Not on file  . Food insecurity    Worry: Not on file    Inability: Not on file  . Transportation needs    Medical: Not on file    Non-medical: Not on file  Tobacco Use  . Smoking status: Former Smoker    Packs/day: 0.50    Years: 10.00    Pack years: 5.00    Types:  Cigarettes    Quit date: 10/28/1979    Years since quitting: 39.8  . Smokeless tobacco: Never Used  Substance and Sexual Activity  . Alcohol use: Yes    Comment: rare  . Drug use: No  . Sexual activity: Not on file  Lifestyle  . Physical activity    Days per week: Not on file  Minutes per session: Not on file  . Stress: Not on file  Relationships  . Social Musician on phone: Not on file    Gets together: Not on file    Attends religious service: Not on file    Active member of club or organization: Not on file    Attends meetings of clubs or organizations: Not on file    Relationship status: Not on file  Other Topics Concern  . Not on file  Social History Narrative  . Not on file     Family History: The patient's family history includes Diabetes in her paternal grandfather; Diverticulitis in her father; Prostate cancer in her father. ROS:   Please see the history of present illness.     All other systems reviewed and are negative.   EKG:  EKG is not ordered today.   Recent Labs: 04/26/2019: NT-Pro BNP 528 08/31/2019: B Natriuretic Peptide 155.1; BUN 18; Creatinine, Ser 0.95; Potassium 4.9; Sodium 142   Recent Lipid Panel    Component Value Date/Time   CHOL 198 11/05/2017 1220   TRIG 81 11/05/2017 1220   TRIG 75 10/23/2006 0812   HDL 73 11/05/2017 1220   CHOLHDL 2.7 11/05/2017 1220   CHOLHDL 3 10/31/2013 1440   VLDL 8.4 10/31/2013 1440   LDLCALC 109 (H) 11/05/2017 1220   LDLDIRECT 121.5 10/31/2013 1440    Physical Exam:    VS:  BP 126/70   Pulse 76   Ht 5\' 8"  (1.727 m)   Wt 262 lb (118.8 kg)   SpO2 97%   BMI 39.84 kg/m     Wt Readings from Last 6 Encounters:  09/02/19 262 lb (118.8 kg)  06/20/19 263 lb 6.4 oz (119.5 kg)  05/04/19 257 lb (116.6 kg)  04/22/19 256 lb (116.1 kg)  02/17/19 246 lb (111.6 kg)  09/21/18 246 lb 12.8 oz (111.9 kg)     Physical Exam  Constitutional: She is oriented to person, place, and time. She appears  well-developed and well-nourished. No distress.  HENT:  Head: Normocephalic and atraumatic.  Neck: Normal range of motion. Neck supple. No JVD present.  Cardiovascular: Normal rate, regular rhythm, normal heart sounds and intact distal pulses. Exam reveals no gallop and no friction rub.  No murmur heard. Pulmonary/Chest: Effort normal and breath sounds normal. No respiratory distress. She has no wheezes. She has no rales.  Abdominal: Soft. Bowel sounds are normal.  Musculoskeletal: Normal range of motion.        General: Edema present.     Comments: Left lower leg edema, 2+ pitting.  No edema on the right.  Neurological: She is alert and oriented to person, place, and time.  Skin: Skin is warm and dry.  Psychiatric: She has a normal mood and affect. Her behavior is normal. Judgment and thought content normal.  Vitals reviewed.    ASSESSMENT:    1. NICM (nonischemic cardiomyopathy) (HCC)   2. Left leg swelling   3. Essential hypertension   4. PVC's (premature ventricular contractions)    PLAN:    In order of problems listed above:  Nonischemic cardiomyopathy Leg swelling -EF 35-40%.  No significant CAD on coronary CTA. -The patient has been managed on Lasix, Entresto and carvedilol. -The patient went to urgent care on 08/31/2019 with complaints of leg edema and chest pain.  Exam and labs were reassuring in regards to chest pain.  She was felt to be mildly volume overloaded.  BNP was mildly elevated at  155.  Chest x-ray showed no overt edema. -Today the patient reports some chest tightness and shortness of breath with bending over, coughing or laughing.  Lungs are clear.  No orthopnea.  She has unilateral edema of the left leg.  She also notes a recent trip to the beach and significant dietary indiscretions with lots of salty food. -The patient was unclear how much Lasix she was taking and may have only been taking 10 mg a day.  Will have her take Lasix 40 mg alternating with 20 mg  daily.  We will follow-up in 10 days in the office with metabolic panel. -With unilateral leg swelling I will check a venous ultrasound to rule out DVT.  -Advised to decrease salt intake.  Hypertension -On low-dose carvedilol, Entresto and Lasix -Blood pressure is well controlled -Labs 2 days ago showed normal renal function and potassium  PVCs -Has been very frequent in the past however less so on heart monitor in 04/2019.  She is currently having rare palpitations.    Medication Adjustments/Labs and Tests Ordered: Current medicines are reviewed at length with the patient today.  Concerns regarding medicines are outlined above. Labs and tests ordered and medication changes are outlined in the patient instructions below:  Patient Instructions  Medication Instructions:  Take 20 mg of lasix daily alternating with 40 mg daily   *If you need a refill on your cardiac medications before your next appointment, please call your pharmacy*  Lab Work: BMET ON 09/30/2019   If you have labs (blood work) drawn today and your tests are completely normal, you will receive your results only by: Marland Kitchen MyChart Message (if you have MyChart) OR . A paper copy in the mail If you have any lab test that is abnormal or we need to change your treatment, we will call you to review the results.  Testing/Procedures: Your physician has requested that you have a lower or upper extremity venous duplex. This test is an ultrasound of the veins in the legs or arms. It looks at venous blood flow that carries blood from the heart to the legs or arms. Allow one hour for a Lower Venous exam. Allow thirty minutes for an Upper Venous exam. There are no restrictions or special instructions.     Follow-Up: You are scheduled to see Pecolia Ades, NP on 09/30/2019 @ 11:30 AM   You are scheduled to see Dr. Harrington Challenger on 10/10/2019 @ 9:40 AM   Other Instructions      Signed, Daune Perch, NP  09/02/2019 5:50 PM    [place]

## 2019-09-03 ENCOUNTER — Telehealth: Payer: Self-pay | Admitting: Cardiology

## 2019-09-03 MED ORDER — APIXABAN 5 MG PO TABS: ORAL_TABLET | ORAL | 0 refills | Status: DC

## 2019-09-03 NOTE — ED Provider Notes (Signed)
[place]   [id number] 08/31/19 Arrival Time: 1227  ASSESSMENT & PLAN:  1. SOB (shortness of breath)   2. Atypical chest pain   3. Lower extremity edema      I have personally viewed the imaging studies ordered this visit. No frank pulmonary edema.  ECG: Performed today and interpreted by me: normal EKG, normal sinus rhythm; no signs of ischemia    Discharge Instructions     Begin increasing your dose of Lasix to 20mg  daily for the next few days.  We will call you with any significant abnormalities of the blood tests drawn today.     Recommend: Follow-up Information    Schedule an appointment as soon as possible for a visit  with Fay Records, MD.   Specialty: Cardiology Contact information: [place] [address] [phone]        [facility] EMERGENCY DEPARTMENT.   Specialty: Emergency Medicine Why: If symptoms worsen in any way. Contact information: [address] [id number] [place] [phone]          Reviewed expectations re: course of current medical issues. Questions answered. Outlined signs and symptoms indicating need for more acute intervention. Patient verbalized understanding. After Visit Summary given.   SUBJECTIVE:  History from: patient. Crystal Yoder is a 73 y.o. female who presents with complaint of intermittent chest discomfort described as "tightness" that does not radiate. Onset gradual, first noted about two d ago. Reports described symptoms are stable since beginning. Injury or recent strenuous activity: none. When present, rates as 2/10 in intensity when present; without associated n/v; with associated mild and transient SOB. No current SOB. Has also noted LE edema; more than usual. Questions increase in salty foods lately. Typical duration of symptoms when present: "unsure". Denies: claudication, fatigue, irregular heart beat,  near-syncope, orthopnea, palpitations, paroxysmal nocturnal dyspnea and tachypnea. Aggravating factors: have not been identified. Alleviating factors: have not been identified. Recent illnesses: none. Fever: absent. Ambulatory without assistance. Self/OTC treatment: none; no new medications. Illicit drug use: none.  Social History   Tobacco Use  Smoking Status Former Smoker  . Packs/day: 0.50  . Years: 10.00  . Pack years: 5.00  . Types: Cigarettes  . Quit date: 10/28/1979  . Years since quitting: 39.8  Smokeless Tobacco Never Used   Social History   Substance and Sexual Activity  Alcohol Use Yes   Comment: rare     ROS: As per HPI. All other systems negative.   OBJECTIVE:  Vitals:   08/31/19 1318  BP: 140/83  Pulse: 69  Resp: (!) 24  Temp: 98.2 F (36.8 C)  TempSrc: Oral  SpO2: 100%    General appearance: alert, oriented, no acute distress Eyes: PERRLA; EOMI; conjunctivae normal HENT: normocephalic; atraumatic Neck: supple with FROM Lungs: without labored respirations; CTAB; speaks full sentences without difficulty Heart: regular rate and rhythm Chest Wall: without tenderness to palpation Abdomen: soft, non-tender; bowel sounds normal; no masses or organomegaly; no guarding or rebound tenderness Extremities: with 1+ LE edema; without calf swelling or tenderness; symmetrical without gross deformities Skin: warm and dry; without rash or lesions Psychological: alert and cooperative; normal mood and affect  Labs: Results for orders placed or performed during the hospital encounter of 44/03/47  Basic metabolic panel  Result Value Ref Range   Sodium 142 135 - 145 mmol/L   Potassium 4.9 3.5 - 5.1 mmol/L   Chloride 107 98 - 111 mmol/L  CO2 25 22 - 32 mmol/L   Glucose, Bld 83 70 - 99 mg/dL   BUN 18 8 - 23 mg/dL   Creatinine, Ser 0.98 0.44 - 1.00 mg/dL   Calcium 9.0 8.9 - 11.9 mg/dL   GFR calc non Af Amer 59 (L) >60 mL/min   GFR calc Af Amer >60 >60 mL/min    Anion gap 10 5 - 15  Brain natriuretic peptide  Result Value Ref Range   B Natriuretic Peptide 155.1 (H) 0.0 - 100.0 pg/mL   Labs Reviewed  BASIC METABOLIC PANEL - Abnormal; Notable for the following components:      Result Value   GFR calc non Af Amer 59 (*)    All other components within normal limits  BRAIN NATRIURETIC PEPTIDE - Abnormal; Notable for the following components:   B Natriuretic Peptide 155.1 (*)    All other components within normal limits    Imaging: Dg Chest 2 View  Result Date: 08/31/2019 CLINICAL DATA:  The breast EXAM: CHEST - 2 VIEW COMPARISON:  Chest radiograph 06/16/2018, 06/03/2017 FINDINGS: Stable cardiomediastinal contours with enlarged heart size. There are a few scattered linear opacities in the lung bases favored to represent atelectasis. No evidence of overt edema or infection. No pneumothorax or pleural effusion. Multilevel degenerative changes in the thoracic spine. IMPRESSION: Scattered linear opacities favored to represent atelectasis. No evidence of overt edema or infection. Electronically Signed   By: Emmaline Kluver M.D.   On: 08/31/2019 14:09       Allergies  Allergen Reactions  . Penicillins Hives and Other (See Comments)    Has patient had a PCN reaction causing immediate rash, facial/tongue/throat swelling, SOB or lightheadedness with hypotension: No Has patient had a PCN reaction causing severe rash involving mucus membranes or skin necrosis: No Has patient had a PCN reaction that required hospitalization: No Has patient had a PCN reaction occurring within the last 10 years: No If all of the above answers are "NO", then may proceed with Cephalosporin use.  . Sulfa Antibiotics Diarrhea, Nausea And Vomiting and Other (See Comments)    Other Reaction: GI Upset Other Reaction: GI Upset  Other Reaction: GI Upset  . Sulfonamide Derivatives Diarrhea and Nausea And Vomiting    Past Medical History:  Diagnosis Date  . Arthritis   .  Arthropathy, unspecified, site unspecified   . COPD (chronic obstructive pulmonary disease) (HCC)   . Esophageal reflux   . Essential hypertension 05/31/2017  . Obesity, unspecified   . Obstructive sleep apnea (adult) (pediatric)   . Other acute sinusitis   . Other primary cardiomyopathies   . Psoriasis   . Shortness of breath   . Unspecified venous (peripheral) insufficiency    Social History   Socioeconomic History  . Marital status: Married    Spouse name: Not on file  . Number of children: Not on file  . Years of education: Not on file  . Highest education level: Not on file  Occupational History  . Occupation: Engineer, site  Social Needs  . Financial resource strain: Not on file  . Food insecurity    Worry: Not on file    Inability: Not on file  . Transportation needs    Medical: Not on file    Non-medical: Not on file  Tobacco Use  . Smoking status: Former Smoker    Packs/day: 0.50    Years: 10.00    Pack years: 5.00    Types: Cigarettes    Quit date:  10/28/1979    Years since quitting: 39.8  . Smokeless tobacco: Never Used  Substance and Sexual Activity  . Alcohol use: Yes    Comment: rare  . Drug use: No  . Sexual activity: Not on file  Lifestyle  . Physical activity    Days per week: Not on file    Minutes per session: Not on file  . Stress: Not on file  Relationships  . Social Musician on phone: Not on file    Gets together: Not on file    Attends religious service: Not on file    Active member of club or organization: Not on file    Attends meetings of clubs or organizations: Not on file    Relationship status: Not on file  . Intimate partner violence    Fear of current or ex partner: Not on file    Emotionally abused: Not on file    Physically abused: Not on file    Forced sexual activity: Not on file  Other Topics Concern  . Not on file  Social History Narrative  . Not on file   Family History  Problem Relation Age of Onset  .  Prostate cancer Father   . Diverticulitis Father   . Diabetes Paternal Grandfather    Past Surgical History:  Procedure Laterality Date  . ABDOMINAL HYSTERECTOMY  2011  . BLADDER SURGERY  2011   tac=ant/post  . CHOLECYSTECTOMY N/A 06/01/2017   Procedure: LAPAROSCOPIC CHOLECYSTECTOMY;  Surgeon: Gaynelle Adu, MD;  Location: [facility];  Service: General;  Laterality: N/A;  . IRRIGATION AND DEBRIDEMENT OF WOUND WITH SPLIT THICKNESS SKIN GRAFT Right 01/25/2014   Procedure: RIGHT FOOT IRRIGATION AND DEBRIDEMENT WITH ACELL/SPLICK THICKNESS SKINGRAFT WITH VAC;  Surgeon: Wayland Denis, DO;  Location: [facility];  Service: Plastics;  Laterality: Right;  . PELVIC FLOOR REPAIR    . REPLACEMENT TOTAL KNEE     rt and lt  . RIGHT HEART CATH N/A 07/28/2018   Procedure: RIGHT HEART CATH;  Surgeon: Lennette Bihari, MD;  Location: [facility] INVASIVE CV LAB;  Service: Cardiovascular;  Laterality: N/A;  . 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

## 2019-09-03 NOTE — Telephone Encounter (Signed)
I called Crystal Yoder. She was unable to get her leg US done yesterday. She has been scheduled for Monday. Since her imaging is postponed, I will start anticoagulation. We can then stop it if her imaging is negative for DVT. She understands and agrees. I will send in Rx for Eliquis now.

## 2019-09-05 ENCOUNTER — Other Ambulatory Visit: Payer: Self-pay | Admitting: Cardiology

## 2019-09-05 ENCOUNTER — Ambulatory Visit (HOSPITAL_COMMUNITY)
Admission: RE | Admit: 2019-09-05 | Discharge: 2019-09-05 | Disposition: A | Discharge status: Home / Self Care | Payer: Medicare Other | Source: Ambulatory Visit | Attending: Cardiology | Admitting: Cardiology

## 2019-09-05 ENCOUNTER — Other Ambulatory Visit: Payer: Medicare Other

## 2019-09-05 ENCOUNTER — Other Ambulatory Visit: Payer: Self-pay

## 2019-09-05 ENCOUNTER — Telehealth: Payer: Self-pay | Admitting: Internal Medicine

## 2019-09-05 DIAGNOSIS — M7989 Other specified soft tissue disorders: Secondary | ICD-10-CM

## 2019-09-05 DIAGNOSIS — I10 Essential (primary) hypertension: Secondary | ICD-10-CM

## 2019-09-05 NOTE — Telephone Encounter (Signed)
Outreach made to Pt.  Pt on another line-states to make this quick.  Advised she is scheduled for a d dimer today which could determine if she has had a clot.  Pt asks this nurse to call her back in 5 minutes.

## 2019-09-05 NOTE — Telephone Encounter (Signed)
Patient is having questions in regards to her lab work done today. She want's to make sure this is not duplicating lab work she had done at urgent care.

## 2019-09-05 NOTE — Telephone Encounter (Signed)
Returned call to pt.  Advised per [facility]-needs d dimer and u/s to rule out DVT so she could possibly discontinue oac.  Pt indicates understanding.

## 2019-09-06 ENCOUNTER — Other Ambulatory Visit: Payer: Self-pay | Admitting: Cardiology

## 2019-09-06 ENCOUNTER — Telehealth: Payer: Self-pay | Admitting: Internal Medicine

## 2019-09-06 LAB — BASIC METABOLIC PANEL
BUN/Creatinine Ratio: 21 (ref 12–28)
BUN: 21 mg/dL (ref 8–27)
CO2: 23 mmol/L (ref 20–29)
Calcium: 9.3 mg/dL (ref 8.7–10.3)
Chloride: 103 mmol/L (ref 96–106)
Creatinine, Ser: 0.98 mg/dL (ref 0.57–1.00)
GFR calc Af Amer: 66 mL/min/{1.73_m2} (ref >59)
GFR calc non Af Amer: 57 mL/min/{1.73_m2} — ABNORMAL LOW (ref >59)
Glucose: 118 mg/dL — ABNORMAL HIGH (ref 65–99)
Potassium: 4.7 mmol/L (ref 3.5–5.2)
Sodium: 142 mmol/L (ref 134–144)

## 2019-09-06 LAB — D-DIMER, QUANTITATIVE: D-DIMER: 3.21 mg/L FEU — ABNORMAL HIGH (ref 0.00–0.49)

## 2019-09-06 NOTE — Telephone Encounter (Signed)
New Message   Pt c/o medication issue:  1. Name of Medication: apixaban (ELIQUIS) 5 MG TABS tablet  2. How are you currently taking this medication (dosage and times per day)? As directed  3. Are you having a reaction (difficulty breathing--STAT)? No  4. What is your medication issue? Patient states that she went for testing yesterday and was told that she does not have a blood clot. Patient wants to know can she stop taking the blood thinners. Please give a call back to discuss.

## 2019-09-06 NOTE — Telephone Encounter (Signed)
Results have been reviewed with patient by NP and apixaban has been discontinued.

## 2019-09-20 ENCOUNTER — Other Ambulatory Visit: Payer: Self-pay

## 2019-09-20 DIAGNOSIS — Z20822 Contact with and (suspected) exposure to covid-19: Secondary | ICD-10-CM

## 2019-09-22 LAB — NOVEL CORONAVIRUS, NAA: SARS-CoV-2, NAA: NOT DETECTED

## 2019-09-28 ENCOUNTER — Other Ambulatory Visit: Payer: Self-pay | Admitting: Family Medicine

## 2019-09-28 DIAGNOSIS — Z1231 Encounter for screening mammogram for malignant neoplasm of breast: Secondary | ICD-10-CM

## 2019-09-30 ENCOUNTER — Other Ambulatory Visit: Payer: Medicare Other

## 2019-09-30 ENCOUNTER — Ambulatory Visit: Payer: Medicare Other | Admitting: Cardiology

## 2019-09-30 ENCOUNTER — Encounter: Payer: Self-pay | Admitting: Cardiology

## 2019-09-30 ENCOUNTER — Other Ambulatory Visit: Payer: Self-pay

## 2019-09-30 VITALS — BP 118/64 | HR 70 | Ht 68.0 in | Wt 258.0 lb

## 2019-09-30 DIAGNOSIS — M7989 Other specified soft tissue disorders: Secondary | ICD-10-CM

## 2019-09-30 DIAGNOSIS — I10 Essential (primary) hypertension: Secondary | ICD-10-CM

## 2019-09-30 DIAGNOSIS — I428 Other cardiomyopathies: Secondary | ICD-10-CM | POA: Diagnosis not present

## 2019-09-30 NOTE — Progress Notes (Signed)
Cardiology Office Note:    Date:  09/30/2019   ID:  Crystal Yoder, DOB 1946-08-24, MRN [id number]  PCP:  Laurann Montana, MD  Cardiologist:  Dietrich Pates, MD  Referring MD: Laurann Montana, MD   Chief Complaint  Patient presents with  . Follow-up    left leg swelling    History of Present Illness:    Crystal Yoder is a 73 y.o. female with a past medical history significant for HTN NICM (LVEF 45 to 50%) GERD, HL, reactive airway.  She was seen last summer by Dr. Tenny Craw for evaluation of increased dyspnea. EKG with increased PVCs (31%). Repeat echoshowedLVEF was  30 to 35% with increased fillingpressuresLA was dilated. There was mild to mod AS. CT coronary angio showed coronary calcium score of 9 andNormal coronary arteries. The PA was noted to be severely dilated at 53 mm.CT of lungs showed no PE. In Oct 2019 she underwent R heart cath To evaluated for pulmonary HTN Results:  Pressures RA: A-wave 12, V wave 12; mean 10 RV: 59/12 PA: 62/26; mean 37 PW: A-wave 20, V wave 22; mean 18 Repeat PA: 55/25; mean 36  Oxygen saturation in the pulmonary artery was 73% in the AO 96%.  By the Fick method, cardiac output was 7.1 L/min with a cardiac index of 3.1 L/min/m.  PVR: 2.66 WU   Recommended lasix 20 mg daily Repeat labs on 10/14 BNP was increased. Lasix was increasted to 40 alternating with 20 mg  She was seen again by Dr. Rosaland Lao 2019.Repeat echo 11/2018: LVEF 35 to 40% and a televisit in June 2020 She has also been seen by Wende Mott, first in June (tele) then in person (July 2020). Repeat monitor ordered to reevaluate PVC burden which showed SR, Average HR 66,PVCs were 7% total, improved from previous.  She was seen in the office on 06/20/2019 by Dr. Tenny Craw at which time she was doing okay.  She was under some life stressors and was moving.  She also was not exercising, limited by foot pain.  She denied palpitations.  Volume status was stable on exam and  Lasix was continued.  Patient was seen at urgent care on 08/31/2019 for complaints of chest pain that occurred with deep inspiration.  Apparently there was little concern about her chest discomfort.  EKG was without ischemia.  BNP was 155.1 and she was told to take extra Lasix for lower extremity swelling  Ms. Forstner was seen by me on 09/02/2019.  She was having no chest tightness.  She was having left leg edema and lower extremity doppler was negative for DVT. She had admitted to dietary intake in salt recently and stress relelated to caring for her husband with Alzheimer's. She was also uncertain of the lasix dose that she was taking and felt that she was only taking 10 mg daily. I increased her lasix to 20 mg alternating with 40 mg.   Today she is here for follow up. She is feeling much better. She took the increases in lasix 40 alt with 20 until last week, when she felt that was too much and she went to 20 mg daily. She is having no shortness of breath or chest pain. She has occ shortness of breath when she bends down. Her wt is down 4 pounds. Her left leg edema is much better, still trace edema. She feels like straightening out her lasix dosing really helped.    Cardiac studies   See HPI   Past  Medical History:  Diagnosis Date  . Arthritis   . Arthropathy, unspecified, site unspecified   . COPD (chronic obstructive pulmonary disease) ([place])   . Esophageal reflux   . Essential hypertension 05/31/2017  . Obesity, unspecified   . Obstructive sleep apnea (adult) (pediatric)   . Other acute sinusitis   . Other primary cardiomyopathies   . Psoriasis   . Shortness of breath   . Unspecified venous (peripheral) insufficiency     Past Surgical History:  Procedure Laterality Date  . ABDOMINAL HYSTERECTOMY  2011  . BLADDER SURGERY  2011   tac=ant/post  . CHOLECYSTECTOMY N/A 06/01/2017   Procedure: LAPAROSCOPIC CHOLECYSTECTOMY;  Surgeon: Greer Pickerel, MD;  Location: [facility];  Service: General;   Laterality: N/A;  . IRRIGATION AND DEBRIDEMENT OF WOUND WITH SPLIT THICKNESS SKIN GRAFT Right 01/25/2014   Procedure: RIGHT FOOT IRRIGATION AND DEBRIDEMENT WITH ACELL/SPLICK THICKNESS SKINGRAFT WITH VAC;  Surgeon: Theodoro Kos, DO;  Location: [facility];  Service: Plastics;  Laterality: Right;  . PELVIC FLOOR REPAIR    . REPLACEMENT TOTAL KNEE     rt and lt  . RIGHT HEART CATH N/A 07/28/2018   Procedure: RIGHT HEART CATH;  Surgeon: Troy Sine, MD;  Location: [facility];  Service: Cardiovascular;  Laterality: N/A;  . 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 carvedilol (COREG) 3.125 MG tablet TAKE 1 TABLET BY MOUTH DAILY  . furosemide (LASIX) 20 MG tablet Take 20 mg by mouth daily. You may take an extra as needed for increased weight gain, shortness of breath or swelling  . pantoprazole (PROTONIX) 40 MG tablet Take 40 mg by mouth daily.   . potassium chloride (K-DUR) 10 MEQ tablet Take 1 tablet (10 mEq total) by mouth every other day.  . sacubitril-valsartan (ENTRESTO) 24-26 MG Take 1 tablet by mouth 2 (two) times daily.     Allergies:   Penicillins, Sulfa antibiotics, and Sulfonamide derivatives   Social History   Socioeconomic History  . Marital status: Married    Spouse name: Not on file  . Number of children: Not on file  . Years of education: Not on file  . Highest education level: Not on file  Occupational History  . Occupation: Education officer, museum  Social Needs  . Financial resource strain: Not on file  . Food insecurity    Worry: Not on file    Inability: Not on file  . Transportation needs    Medical: Not on file    Non-medical: Not on file  Tobacco Use  . Smoking status: Former Smoker    Packs/day: 0.50    Years: 10.00    Pack years: 5.00    Types: Cigarettes    Quit date: 10/28/1979    Years since quitting: 39.9  . Smokeless tobacco: Never Used  Substance and Sexual Activity  . Alcohol  use: Yes    Comment: rare  . Drug use: No  . Sexual activity: Not on file  Lifestyle  . Physical activity    Days per week: Not on file    Minutes per session: Not on file  . Stress: Not on file  Relationships  . Social Herbalist on phone: Not on file    Gets together: Not on file    Attends religious service: Not on file    Active member of club or organization: Not on file    Attends  meetings of clubs or organizations: Not on file    Relationship status: Not on file  Other Topics Concern  . Not on file  Social History Narrative  . Not on file     Family History: The patient's family history includes Diabetes in her paternal grandfather; Diverticulitis in her father; Prostate cancer in her father. ROS:   Please see the history of present illness.     All other systems reviewed and are negative.   EKG:  EKG is not ordered today.    Recent Labs: 04/26/2019: NT-Pro BNP 528 08/31/2019: B Natriuretic Peptide 155.1 09/05/2019: BUN 21; Creatinine, Ser 0.98; Potassium 4.7; Sodium 142   Recent Lipid Panel    Component Value Date/Time   CHOL 198 11/05/2017 1220   TRIG 81 11/05/2017 1220   TRIG 75 10/23/2006 0812   HDL 73 11/05/2017 1220   CHOLHDL 2.7 11/05/2017 1220   CHOLHDL 3 10/31/2013 1440   VLDL 8.4 10/31/2013 1440   LDLCALC 109 (H) 11/05/2017 1220   LDLDIRECT 121.5 10/31/2013 1440    Physical Exam:    VS:  BP 118/64   Pulse 70   Ht 5\' 8"  (1.727 m)   Wt 258 lb (117 kg)   SpO2 98%   BMI 39.23 kg/m     Wt Readings from Last 6 Encounters:  09/30/19 258 lb (117 kg)  09/02/19 262 lb (118.8 kg)  06/20/19 263 lb 6.4 oz (119.5 kg)  05/04/19 257 lb (116.6 kg)  04/22/19 256 lb (116.1 kg)  02/17/19 246 lb (111.6 kg)     Physical Exam  Constitutional: She is oriented to person, place, and time. She appears well-developed and well-nourished. No distress.  HENT:  Head: Normocephalic and atraumatic.  Neck: Normal range of motion. Neck supple. No JVD  present.  Cardiovascular: Normal rate, regular rhythm, normal heart sounds and intact distal pulses. Exam reveals no gallop and no friction rub.  No murmur heard. Pulmonary/Chest: Effort normal and breath sounds normal. No respiratory distress. She has no wheezes. She has no rales.  Abdominal: Soft. Bowel sounds are normal.  Musculoskeletal: Normal range of motion.        General: Edema present.     Comments: Trace left pedal edema  Neurological: She is alert and oriented to person, place, and time.  Skin: Skin is warm and dry.  Psychiatric: She has a normal mood and affect. Her behavior is normal. Judgment and thought content normal.  Vitals reviewed.    ASSESSMENT:    1. NICM (nonischemic cardiomyopathy) (HCC)   2. Leg swelling   3. Essential hypertension    PLAN:    In order of problems listed above:  Nonischemic cardiomyopathy Leg swelling -EF 35-40%.  No significant CAD on coronary CTA. -The patient has been managed on Lasix, Entresto and carvedilol. -Left leg ultrasound was negative for DVT. -Lasix increased on 11/6 with much improvement. Wt is down 4 pounds. She has reduced her lasix back to 20 gm daily as she feels back to normal. Left leg swelling is nearly resolved.  -I recommended increasing her Entresto dose as she is on the low dose which would help her diuresis but she does not want to make a change at this time.  -I provided information on when to take extra Lasix and she verbalizes understanding. -If she had any shortness of breath or chest pain I had planned to possibly check a CT of her chest for possible PE but she thinks that she is back to  normal and does not feel that she needs any further testing.  Hypertension -On low-dose carvedilol, Entresto and Lasix -Blood pressure is well controlled  PVCs -Has been very frequent in the past however less so on heart monitor in 04/2019.  She is currently having very rare palpitations.   Medication Adjustments/Labs  and Tests Ordered: Current medicines are reviewed at length with the patient today.  Concerns regarding medicines are outlined above. Labs and tests ordered and medication changes are outlined in the patient instructions below:  Patient Instructions  Medication Instructions:  DECREASE: Lasix to 20 mg once a day, You may take an extra dose as needed for increased weight gain, Shortness of breath or swelling   *If you need a refill on your cardiac medications before your next appointment, please call your pharmacy*  Lab Work: BMET today   If you have labs (blood work) drawn today and your tests are completely normal, you will receive your results only by: Marland Kitchen. MyChart Message (if you have MyChart) OR . A paper copy in the mail If you have any lab test that is abnormal or we need to change your treatment, we will call you to review the results.  Testing/Procedures: None ordered   Follow-Up: Follow up with Dr. Tenny Crawoss on 12/26/2019 @ 11:40 AM   Other Instructions  Do the following things EVERY DAY:   1. Weigh yourself EVERY morning after you go to the bathroom but before you eat or drink anything. Write this number down in a weight log/diary. If you gain 3 pounds overnight or 5 pounds in a week, take an extra lasix. call the office if no improvement with extra lasix    2. Take your medicines as prescribed. If you have concerns about your medications, please call us before you stop taking them.    3. Eat low salt foods-Limit salt (sodium) to 2000 mg per day. This will help prevent your body from holding onto fluid. Read food labels as many processed foods have a lot of sodium, especially canned goods and prepackaged meats. If you would like some assistance choosing low sodium foods, we would be happy to set you up with a nutritionist.   4. Stay as active as you can everyday. Staying active will give you more energy and make your muscles stronger. Start with 5 minutes at a time and work your way up  to 30 minutes a day. Break up your activities--do some in the morning and some in the afternoon. Start with 3 days per week and work your way up to 5 days as you can.  If you have chest pain, feel short of breath, dizzy, or lightheaded, STOP. If you don't feel better after a short rest, call 911. If you do feel better, call the office to let us know you have symptoms with exercise.   5. Limit all fluids for the day to less than 2 liters. Fluid includes all drinks, coffee, juice, ice chips, soup, jello, and all other liquids.       Signed, Berton BonJanine Karryn Kosinski, NP  09/30/2019 6:02 PM    [facility]

## 2019-09-30 NOTE — Patient Instructions (Addendum)
Medication Instructions:  DECREASE: Lasix to 20 mg once a day, You may take an extra dose as needed for increased weight gain, Shortness of breath or swelling   *If you need a refill on your cardiac medications before your next appointment, please call your pharmacy*  Lab Work: BMET today   If you have labs (blood work) drawn today and your tests are completely normal, you will receive your results only by: Marland Kitchen MyChart Message (if you have MyChart) OR . A paper copy in the mail If you have any lab test that is abnormal or we need to change your treatment, we will call you to review the results.  Testing/Procedures: None ordered   Follow-Up: Follow up with Dr. Harrington Challenger on 12/26/2019 @ 11:40 AM   Other Instructions  Do the following things EVERY DAY:   1. Weigh yourself EVERY morning after you go to the bathroom but before you eat or drink anything. Write this number down in a weight log/diary. If you gain 3 pounds overnight or 5 pounds in a week, take an extra lasix. call the office if no improvement with extra lasix    2. Take your medicines as prescribed. If you have concerns about your medications, please call us before you stop taking them.    3. Eat low salt foods-Limit salt (sodium) to 2000 mg per day. This will help prevent your body from holding onto fluid. Read food labels as many processed foods have a lot of sodium, especially canned goods and prepackaged meats. If you would like some assistance choosing low sodium foods, we would be happy to set you up with a nutritionist.   4. Stay as active as you can everyday. Staying active will give you more energy and make your muscles stronger. Start with 5 minutes at a time and work your way up to 30 minutes a day. Break up your activities--do some in the morning and some in the afternoon. Start with 3 days per week and work your way up to 5 days as you can.  If you have chest pain, feel short of breath, dizzy, or lightheaded, STOP. If you  don't feel better after a short rest, call 911. If you do feel better, call the office to let us know you have symptoms with exercise.   5. Limit all fluids for the day to less than 2 liters. Fluid includes all drinks, coffee, juice, ice chips, soup, jello, and all other liquids.

## 2019-10-01 LAB — BASIC METABOLIC PANEL
BUN/Creatinine Ratio: 22 (ref 12–28)
BUN: 24 mg/dL (ref 8–27)
CO2: 26 mmol/L (ref 20–29)
Calcium: 9.3 mg/dL (ref 8.7–10.3)
Chloride: 101 mmol/L (ref 96–106)
Creatinine, Ser: 1.09 mg/dL — ABNORMAL HIGH (ref 0.57–1.00)
GFR calc Af Amer: 58 mL/min/{1.73_m2} — ABNORMAL LOW (ref >59)
GFR calc non Af Amer: 50 mL/min/{1.73_m2} — ABNORMAL LOW (ref >59)
Glucose: 91 mg/dL (ref 65–99)
Potassium: 4.7 mmol/L (ref 3.5–5.2)
Sodium: 141 mmol/L (ref 134–144)

## 2019-10-10 ENCOUNTER — Ambulatory Visit: Payer: Medicare Other | Admitting: Internal Medicine

## 2019-11-01 ENCOUNTER — Ambulatory Visit: Payer: Medicare PPO | Attending: Internal Medicine

## 2019-11-01 DIAGNOSIS — Z20822 Contact with and (suspected) exposure to covid-19: Secondary | ICD-10-CM

## 2019-11-03 LAB — NOVEL CORONAVIRUS, NAA: SARS-CoV-2, NAA: NOT DETECTED

## 2019-11-18 ENCOUNTER — Ambulatory Visit: Payer: Medicare Other

## 2019-12-23 NOTE — Progress Notes (Signed)
Cardiology Office Note   Date:  12/26/2019   ID:  Crystal Yoder, Crystal Yoder 11-20-1945, MRN [id number]  PCP:  Harlan Stains, MD  Cardiologist:   Dorris Carnes, MD   F/U of NICM     History of Present Illness: Crystal Yoder is a 74 y.o. female with a history of HTN, NICM (LVEF 45 to 50%) GERD, HL, reactive airway disease and mild AS   SHe also has Hx of PVCs with Holter monitor in 2019showing PVC burden was 31%   Repeat echo showed LVEF was  30 to 35% with increased filling pressures   LA was dilated There was mild to mod AS.  With Decline in LVEF,  CT coronary angio showed coronary calcium score of 9   Normal coronary arteries  The PA was noted to be severely dilated at 53 mm    CT of lungs showed no PE   In Oct 2019 she underwent R heart cath  To evaluated for pulmonary HTN   Results:  Pressures RA: A-wave 12, V wave 12; mean 10 RV: 59/12 PA: 62/26; mean 37 PW: A-wave 20, V wave 22; mean 18 Repeat PA: 55/25; mean 36  Oxygen saturation in the pulmonary artery was 73% in the AO 96%.  By the Fick method, cardiac output was 7.1 L/min with a cardiac index of 3.1 L/min/m.  PVR: 2.66 WU   Lasix Rx was recommended    I saw the pt in clinic in  Nov 2019  Repeat echo 11/2018:  LVEF 35 to 40% and a televisit in June 2020 She has also been seen by Kathleen Argue, first in June (tele) then in person (July 2020)  Repeat monitor ordered to reevaluate PVC burden  This showed  PVCs were 7% total, improved from previous holter in 2019    I saw the pt in Aug 2020   She was seen by Buren Kos in Nov and Dec 2020     The pt denies CP   She says she noticed she is slowing down   Due to COVID she has not been able to go to the [facility] to swim.  Because of joint problems she has problems with Duanne Guess activiiite   Her husband bought her an exercise gadget but she has not used it yet    Current Meds  Medication Sig  . potassium chloride SA (KLOR-CON) 20 MEQ tablet potassium chloride ER 20 mEq tablet,extended  release(part/cryst)     Allergies:   Penicillins, Sulfa antibiotics, and Sulfonamide derivatives   Past Medical History:  Diagnosis Date  . Arthritis   . Arthropathy, unspecified, site unspecified   . COPD (chronic obstructive pulmonary disease) ([place])   . Esophageal reflux   . Essential hypertension 05/31/2017  . Obesity, unspecified   . Obstructive sleep apnea (adult) (pediatric)   . Other acute sinusitis   . Other primary cardiomyopathies   . Psoriasis   . Shortness of breath   . Unspecified venous (peripheral) insufficiency     Past Surgical History:  Procedure Laterality Date  . ABDOMINAL HYSTERECTOMY  2011  . BLADDER SURGERY  2011   tac=ant/post  . CHOLECYSTECTOMY N/A 06/01/2017   Procedure: LAPAROSCOPIC CHOLECYSTECTOMY;  Surgeon: Greer Pickerel, MD;  Location: [facility];  Service: General;  Laterality: N/A;  . IRRIGATION AND DEBRIDEMENT OF WOUND WITH SPLIT THICKNESS SKIN GRAFT Right 01/25/2014   Procedure: RIGHT FOOT IRRIGATION AND DEBRIDEMENT WITH ACELL/SPLICK THICKNESS SKINGRAFT WITH VAC;  Surgeon: Theodoro Kos, DO;  Location: [facility];  Service: Plastics;  Laterality: Right;  . PELVIC FLOOR REPAIR    . REPLACEMENT TOTAL KNEE     rt and lt  . RIGHT HEART CATH N/A 07/28/2018   Procedure: RIGHT HEART CATH;  Surgeon: Lennette Bihari, MD;  Location: [facility];  Service: Cardiovascular;  Laterality: N/A;  . 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 Her smoking use included cigarettes. She has a 5.00 pack-year smoking history. She has never used smokeless tobacco. She reports current alcohol use. She reports that she does not use drugs.   Family History:  The patient's family history includes Diabetes in her paternal grandfather; Diverticulitis in her father; Prostate cancer in her father.    ROS:  Please see the history of present illness. All other systems are reviewed and   Negative to the above problem except as noted.    PHYSICAL EXAM: VS:  BP 114/68   Pulse 65   Ht 5\' 8"  (1.727 m)   Wt 260 lb 3.2 oz (118 kg)   SpO2 99%   BMI 39.56 kg/m   GEN: Morbidly obese 74 yo in no acute distress  HEENT: normal  Neck: JVP is normal   No carotid bruits, Cardiac: RRR; no murmurs, rubs, or gallops,Tr LE edema (chronic statis changes) Respiratory:  clear to auscultation bilaterally, normal work of breathing GI: soft, nontender, nondistended, + BS  No hepatomegaly  MS: no deformity Moving all extremities   Skin: Chronic skin changes   Neuro:  Strength and sensation are intact Psych: euthymic mood, full affect   EKG:  EKG is not ordered  Lipid Panel    Component Value Date/Time   CHOL 198 11/05/2017 1220   TRIG 81 11/05/2017 1220   TRIG 75 10/23/2006 0812   HDL 73 11/05/2017 1220   CHOLHDL 2.7 11/05/2017 1220   CHOLHDL 3 10/31/2013 1440   VLDL 8.4 10/31/2013 1440   LDLCALC 109 (H) 11/05/2017 1220   LDLDIRECT 121.5 10/31/2013 1440      Wt Readings from Last 3 Encounters:  12/26/19 260 lb 3.2 oz (118 kg)  09/30/19 258 lb (117 kg)  09/02/19 262 lb (118.8 kg)      ASSESSMENT AND PLAN:  1   NICM  Volume status is OK  Last echo in Feb 2020 LVEF was reported at 45 to 50%  I though it wss more  Like 40%  WIll follow   Will check BMET and BNP   2  HTN  BP is controlled    3  Lipids  Will repeat lipoits    4  PVCs  Keep on Coreg   I encouraged her to stay acitve    She may find a time to sched at pool.  Re covid vaccine, she says that she was not planning to take due to other medical prmm F?U in clinic in September    Signed, October, MD  12/26/2019 12:06 PM    [facility] [address] [place], [place], [place]  [place] Phone: [phone]; Fax: [phone]

## 2019-12-26 ENCOUNTER — Other Ambulatory Visit: Payer: Self-pay

## 2019-12-26 ENCOUNTER — Encounter: Payer: Self-pay | Admitting: Internal Medicine

## 2019-12-26 ENCOUNTER — Ambulatory Visit: Payer: Medicare PPO | Admitting: Internal Medicine

## 2019-12-26 VITALS — BP 114/68 | HR 65 | Ht 68.0 in | Wt 260.2 lb

## 2019-12-26 DIAGNOSIS — I10 Essential (primary) hypertension: Secondary | ICD-10-CM

## 2019-12-26 DIAGNOSIS — I493 Ventricular premature depolarization: Secondary | ICD-10-CM

## 2019-12-26 DIAGNOSIS — E782 Mixed hyperlipidemia: Secondary | ICD-10-CM

## 2019-12-26 DIAGNOSIS — I428 Other cardiomyopathies: Secondary | ICD-10-CM | POA: Diagnosis not present

## 2019-12-26 NOTE — Patient Instructions (Addendum)
Medication Instructions:  No changes *If you need a refill on your cardiac medications before your next appointment, please call your pharmacy*   Lab Work: Take prescription with you for blood work- results to be faxed to Dr. Tenny Craw: [phone]-- BMET, LIPID, CBC   Testing/Procedures: none  Your physician recommends that you schedule a follow-up appointment in: September, 2021 with Dr. Tenny Craw.NIC   Other Instructions

## 2019-12-29 ENCOUNTER — Other Ambulatory Visit: Payer: Self-pay | Admitting: Internal Medicine

## 2020-01-23 ENCOUNTER — Ambulatory Visit: Payer: Medicare PPO | Admitting: Internal Medicine

## 2020-01-23 ENCOUNTER — Encounter: Payer: Self-pay | Admitting: Internal Medicine

## 2020-01-23 ENCOUNTER — Other Ambulatory Visit: Payer: Self-pay

## 2020-01-23 DIAGNOSIS — G4733 Obstructive sleep apnea (adult) (pediatric): Secondary | ICD-10-CM | POA: Diagnosis not present

## 2020-01-23 DIAGNOSIS — R05 Cough: Secondary | ICD-10-CM | POA: Diagnosis not present

## 2020-01-23 DIAGNOSIS — J310 Chronic rhinitis: Secondary | ICD-10-CM | POA: Diagnosis not present

## 2020-01-23 DIAGNOSIS — J410 Simple chronic bronchitis: Secondary | ICD-10-CM

## 2020-01-23 DIAGNOSIS — R053 Chronic cough: Secondary | ICD-10-CM

## 2020-01-23 NOTE — Progress Notes (Signed)
Patient ID: Crystal Yoder, female    DOB: 1946-05-19, 74 y.o.   MRN: [id number]   HPI F former smoker followed for OSA/ failed CPAP, allergic rhinitis, COPD/ chronic cough,  complicated by hx of GERD, peripheral venous insufficiency, OHS, Asplenia, CM/CHF, psoriasis NPSG 05/03/00 [place]- AHI 20.9, incomplete control on BIPAP 13/7, body weight 237 lbs PFT 2012-mild obstruction small airways, DLCO 54% Echocardiogram 08/27/2016-EF 40%-45%, , gr 1 diastolic dysfunction, chamber dilatation, PA peak 47 mmHg FENO 08/29/16  6- WNL -----------------------------------------------------------------------------------   06/03/2018- 74 year old female former smoker followed for COPD/chronic cough, allergic rhinitis, OSA/failed CPAP, complicated by GERD, peripheral venous insufficiency, obesity, OHS, Asplenia, CM/CHF/ diast dysfunction, psoriasis -----1 yr f/u for OSA and possible allergies.  Patient refused to be weighed. Flonase A little wheeze only occasionally, especially while lying down.  It does not wake her and "not bad". Had cholecystectomy last year and denies respiratory problems with that. Followed by her cardiologist with recent blood pressure med changes. BNP 3 days ago was 3,419 - pending f/u. Husband tells her she no longer snores or has witnessed apneas.  We discussed oral appliances a therapeutic option if the symptoms return. Has had pneumonia vaccines at her PCP office. CT chest 10/30/2017 IMPRESSION: Stable right middle lobe pulmonary nodule, consistent with benign etiology. No active disease within the thorax. Aortic Atherosclerosis (ICD10-I70.0) and Emphysema (ICD10-J43.9).  01/23/20- 74 year old female former smoker followed for COPD/chronic cough, allergic rhinitis, OSA/failed CPAP, complicated by GERD, peripheral venous insufficiency, obesity, OHS, Asplenia, CM/CHF/ diast dysfunction, psoriasis -----f/u OSA/Cardiomyopathy. Patient stated she does have a cough when she wakes up  .  Not using CPAP. Husband tells her she is not snoring.  ED visit 08/31/19 for fluid overload Rx'd lasix Dry cough on first waking.  She minimizes reflux symptoms. Some postnasal drip. Bothersome pressure discomfort R ear eval by Dr Rosen/ ENT who suggested she see dentist for TMJ.  CXR 08/31/2019-  IMPRESSION: Scattered linear opacities favored to represent atelectasis. No evidence of overt edema or infection.  Review of Systems-see HPI    + = positive Constitutional:   No-   weight loss, night sweats, fevers, chills, + fatigue, lassitude. HEENT:   No-   headaches, difficulty swallowing, tooth/dental problems, sore throat,                  Little-   sneezing, itching, ear ache, nasal congestion, +post nasal drip,  CV:  No-   chest pain, orthopnea, PND, swelling in lower extremities, anasarca, dizziness, palpitations GI:  No-   heartburn, indigestion, abdominal pain, nausea, vomiting, diarrhea,                 change in bowel habits, loss of appetite Resp: + shortness of breath with exertion or at rest.  No-  excess mucus,             No-  coughing up of blood.  + Persistent dry cough           change in color of mucus.  No- wheezing.   Skin: No-   rash or lesions. GU: No-   dysuria,  MS:  No-acute  joint pain or swelling. Psych:  No- change in mood or affect. + depression or anxiety.  No memory loss.  Objective:   Physical Exam  General- Alert, Oriented, Affect-appropriate, Distress- none acute    + Obese,  Skin- rash-none, lesions- none, excoriation- none Lymphadenopathy- none Head- atraumatic  Eyes- Gross vision intact, PERRLA, conjunctivae clear secretions            Ears- Hearing ok            Nose- +watery, No-Septal dev, mucus, polyps, erosion, perforation             Throat- Mallampati III-IV , mucosa clear , drainage- none, tonsils- atrophic.  Neck- flexible , trachea midline, no stridor , thyroid nl, carotid no bruit Chest - symmetrical excursion ,  unlabored           Heart/CV- RRR , no murmur , no gallop  , no rub, nl s1 s2                           - JVD- none , edema+ trace, stasis changes- none, varices- none           Lung-  Wheeze-None, cough-none; no cough with deep breath , dullness-none, rub- none           Chest wall-  Abd-  Br/ Gen/ Rectal- Not done, not indicated Extrem- cyanosis- none, clubbing, none, atrophy- none, strength- nl.  Superficial varices.  Neuro- grossly intact to observation

## 2020-01-23 NOTE — Patient Instructions (Signed)
Suggest using Sudafed - ask at pharmacy counter  Suggest trying otc Nasalcrom/ cromol/ cromolyn nasal spray as an alternative to try instead of Flonase, for comparison.  Please call if we can help

## 2020-02-01 NOTE — Assessment & Plan Note (Signed)
Chest sounds clear and no acute event Dry cough is nonspecific, mainly on awakening and evidently not very bothersome.  Plan- cautioned to pay attention to nasal drainage, reflux, and potential correlates.

## 2020-02-01 NOTE — Assessment & Plan Note (Signed)
Plan- suggested she explore Sudafed and Nasalcrom

## 2020-02-01 NOTE — Assessment & Plan Note (Signed)
Watch for triggers as discussed. This seems to be better now than in past.

## 2020-02-01 NOTE — Assessment & Plan Note (Signed)
She has given up on CPAP and husband tells her she is not snoring. Plan- encourage conservative measures, esp weight loss and sleep position off back

## 2020-04-06 DIAGNOSIS — M2042 Other hammer toe(s) (acquired), left foot: Secondary | ICD-10-CM | POA: Diagnosis not present

## 2020-04-06 DIAGNOSIS — M792 Neuralgia and neuritis, unspecified: Secondary | ICD-10-CM | POA: Diagnosis not present

## 2020-04-06 DIAGNOSIS — M2041 Other hammer toe(s) (acquired), right foot: Secondary | ICD-10-CM | POA: Diagnosis not present

## 2020-04-06 DIAGNOSIS — B351 Tinea unguium: Secondary | ICD-10-CM | POA: Diagnosis not present

## 2020-04-06 DIAGNOSIS — I739 Peripheral vascular disease, unspecified: Secondary | ICD-10-CM | POA: Diagnosis not present

## 2020-04-16 DIAGNOSIS — Z0000 Encounter for general adult medical examination without abnormal findings: Secondary | ICD-10-CM | POA: Diagnosis not present

## 2020-04-16 DIAGNOSIS — Z9081 Acquired absence of spleen: Secondary | ICD-10-CM | POA: Diagnosis not present

## 2020-04-16 DIAGNOSIS — E785 Hyperlipidemia, unspecified: Secondary | ICD-10-CM | POA: Diagnosis not present

## 2020-04-16 DIAGNOSIS — E538 Deficiency of other specified B group vitamins: Secondary | ICD-10-CM | POA: Diagnosis not present

## 2020-04-16 DIAGNOSIS — I420 Dilated cardiomyopathy: Secondary | ICD-10-CM | POA: Diagnosis not present

## 2020-04-16 DIAGNOSIS — E559 Vitamin D deficiency, unspecified: Secondary | ICD-10-CM | POA: Diagnosis not present

## 2020-04-16 DIAGNOSIS — Z79899 Other long term (current) drug therapy: Secondary | ICD-10-CM | POA: Diagnosis not present

## 2020-04-16 DIAGNOSIS — I700 Atherosclerosis of aorta: Secondary | ICD-10-CM | POA: Diagnosis not present

## 2020-04-16 DIAGNOSIS — Z23 Encounter for immunization: Secondary | ICD-10-CM | POA: Diagnosis not present

## 2020-04-18 DIAGNOSIS — N819 Female genital prolapse, unspecified: Secondary | ICD-10-CM | POA: Diagnosis not present

## 2020-04-18 DIAGNOSIS — N952 Postmenopausal atrophic vaginitis: Secondary | ICD-10-CM | POA: Diagnosis not present

## 2020-04-18 DIAGNOSIS — Z960 Presence of urogenital implants: Secondary | ICD-10-CM | POA: Diagnosis not present

## 2020-04-20 ENCOUNTER — Other Ambulatory Visit: Payer: Self-pay | Admitting: Internal Medicine

## 2020-05-02 DIAGNOSIS — I739 Peripheral vascular disease, unspecified: Secondary | ICD-10-CM | POA: Diagnosis not present

## 2020-05-28 DIAGNOSIS — L821 Other seborrheic keratosis: Secondary | ICD-10-CM | POA: Diagnosis not present

## 2020-05-28 DIAGNOSIS — Z1283 Encounter for screening for malignant neoplasm of skin: Secondary | ICD-10-CM | POA: Diagnosis not present

## 2020-06-01 ENCOUNTER — Other Ambulatory Visit: Payer: Self-pay | Admitting: Internal Medicine

## 2020-06-04 ENCOUNTER — Other Ambulatory Visit: Payer: Self-pay | Admitting: Internal Medicine

## 2020-06-28 NOTE — Progress Notes (Signed)
Cardiology Office Note   Date:  06/29/2020   ID:  Crystal Yoder, Crystal Yoder Aug 11, 1946, MRN [id number]  PCP:  Laurann Montana, MD  Cardiologist:   Dietrich Pates, MD   F/U of NICM     History of Present Illness: Crystal Yoder is a 74 y.o. female with a history of HTN, NICM (LVEF 45 to 50%) GERD, HL, reactive airway disease and mild AS   SHe also has Hx of PVCs with Holter monitor in 2019showing PVC burden was 31%   Repeat echo showed LVEF was  30 to 35% with increased filling pressures   LA was dilated There was mild to mod AS.  With Decline in LVEF,  CT coronary angio showed coronary calcium score of 9   Normal coronary arteries  The PA was noted to be severely dilated at 53 mm    CT of lungs showed no PE   In Oct 2019 she underwent R heart cath  To evaluated for pulmonary HTN   Results:  Pressures RA: A-wave 12, V wave 12; mean 10 RV: 59/12 PA: 62/26; mean 37 PW: A-wave 20, V wave 22; mean 18 Repeat PA: 55/25; mean 36  Oxygen saturation in the pulmonary artery was 73% in the AO 96%.  By the Fick method, cardiac output was 7.1 L/min with a cardiac index of 3.1 L/min/m.  PVR: 2.66 WU   Lasix Rx was recommended    I saw the pt in clinic in  Nov 2019  Repeat echo 11/2018:  LVEF 35 to 40% and a televisit in June 2020  I saw the pt in March 2021  Since seen she has been under increased stress with husband Breathing is relatively stable  She denies CP   No significant LE edema She still has not gotten COVID vaccine     Current Meds  Medication Sig  . carvedilol (COREG) 3.125 MG tablet TAKE 1 TABLET BY MOUTH DAILY  . ENTRESTO 24-26 MG TAKE 1 TABLET BY MOUTH TWICE DAILY  . furosemide (LASIX) 20 MG tablet Take 20 mg by mouth daily. You may take an extra as needed for increased weight gain, shortness of breath or swelling  . pantoprazole (PROTONIX) 40 MG tablet Take 40 mg by mouth daily.   . potassium chloride SA (KLOR-CON) 20 MEQ tablet TAKE 1 TABLET BY MOUTH DAILY      Allergies:   Penicillins, Sulfa antibiotics, and Sulfonamide derivatives   Past Medical History:  Diagnosis Date  . Arthritis   . Arthropathy, unspecified, site unspecified   . COPD (chronic obstructive pulmonary disease) (HCC)   . Esophageal reflux   . Essential hypertension 05/31/2017  . Obesity, unspecified   . Obstructive sleep apnea (adult) (pediatric)   . Other acute sinusitis   . Other primary cardiomyopathies   . Psoriasis   . Shortness of breath   . Unspecified venous (peripheral) insufficiency     Past Surgical History:  Procedure Laterality Date  . ABDOMINAL HYSTERECTOMY  2011  . BLADDER SURGERY  2011   tac=ant/post  . CHOLECYSTECTOMY N/A 06/01/2017   Procedure: LAPAROSCOPIC CHOLECYSTECTOMY;  Surgeon: Gaynelle Adu, MD;  Location: [facility];  Service: General;  Laterality: N/A;  . IRRIGATION AND DEBRIDEMENT OF WOUND WITH SPLIT THICKNESS SKIN GRAFT Right 01/25/2014   Procedure: RIGHT FOOT IRRIGATION AND DEBRIDEMENT WITH ACELL/SPLICK THICKNESS SKINGRAFT WITH VAC;  Surgeon: Wayland Denis, DO;  Location: [facility];  Service: Plastics;  Laterality: Right;  . PELVIC FLOOR  REPAIR    . REPLACEMENT TOTAL KNEE     rt and lt  . RIGHT HEART CATH N/A 07/28/2018   Procedure: RIGHT HEART CATH;  Surgeon: Lennette Bihari, MD;  Location: [facility];  Service: Cardiovascular;  Laterality: N/A;  . 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 Her smoking use included cigarettes. She has a 5.00 pack-year smoking history. She has never used smokeless tobacco. She reports current alcohol use. She reports that she does not use drugs.   Family History:  The patient's family history includes Diabetes in her paternal grandfather; Diverticulitis in her father; Prostate cancer in her father.    ROS:  Please see the history of present illness. All other systems are reviewed and  Negative to the  above problem except as noted.    PHYSICAL EXAM: VS:  BP 112/70   Pulse 70   Ht 5\' 8"  (1.727 m)   Wt 263 lb (119.3 kg)   SpO2 94%   BMI 39.99 kg/m   GEN: Morbidly obese 74 yo in no acute distress  HEENT: normal  Neck: JVP is not elevated  Cardiac: RRR; no murmurs, rubs, or gallops,Trivial LE edema Respiratory:  clear to auscultation bilaterally GI: soft, nontender.  NO obvious hepatomegaly  Skin: Chronic stasis changes legs  Neuro:  Strength and sensation are intact Psych: euthymic mood, full affect   EKG:  EKG shows SR 70 LBBB Lipid Panel    Component Value Date/Time   CHOL 198 11/05/2017 1220   TRIG 81 11/05/2017 1220   TRIG 75 10/23/2006 0812   HDL 73 11/05/2017 1220   CHOLHDL 2.7 11/05/2017 1220   CHOLHDL 3 10/31/2013 1440   VLDL 8.4 10/31/2013 1440   LDLCALC 109 (H) 11/05/2017 1220   LDLDIRECT 121.5 10/31/2013 1440      Wt Readings from Last 3 Encounters:  06/29/20 263 lb (119.3 kg)  12/26/19 260 lb 3.2 oz (118 kg)  09/30/19 258 lb (117 kg)      ASSESSMENT AND PLAN:  1   NICM    Volume status is ovrrall OK   Keep on same meds     2  HTN  BP is controlled    3  Lipids  Lipids were fair in 2019   109    4  PVCs Continue Coreg    5  COVID  Discussed vaccine and her concerns for it.  I would recomm getting    Even if gits/doesn't get, if gets covid needs to call re monoclonal antibody   Vit D, VitC, Vit B   F/U in the spring   Signed, 05-18-1991, MD  06/29/2020 1:56 PM    [facility] [address] [place], [place], [place]  [place] Phone: [phone]; Fax: [phone]

## 2020-06-29 ENCOUNTER — Encounter: Payer: Self-pay | Admitting: Internal Medicine

## 2020-06-29 ENCOUNTER — Ambulatory Visit: Payer: Medicare PPO | Admitting: Internal Medicine

## 2020-06-29 ENCOUNTER — Other Ambulatory Visit: Payer: Self-pay

## 2020-06-29 VITALS — BP 112/70 | HR 70 | Ht 68.0 in | Wt 263.0 lb

## 2020-06-29 DIAGNOSIS — I428 Other cardiomyopathies: Secondary | ICD-10-CM

## 2020-06-29 NOTE — Patient Instructions (Signed)
Medication Instructions:  No changes *If you need a refill on your cardiac medications before your next appointment, please call your pharmacy*   Lab Work: none If you have labs (blood work) drawn today and your tests are completely normal, you will receive your results only by: . MyChart Message (if you have MyChart) OR . A paper copy in the mail If you have any lab test that is abnormal or we need to change your treatment, we will call you to review the results.   Testing/Procedures: none   Follow-Up: At [facility], you and your health needs are our priority.  As part of our continuing mission to provide you with exceptional heart care, we have created designated Provider Care Teams.  These Care Teams include your primary Cardiologist (physician) and Advanced Practice Providers (APPs -  Physician Assistants and Nurse Practitioners) who all work together to provide you with the care you need, when you need it.   Your next appointment:   7 month(s)  The format for your next appointment:   In Person  Provider:   You may see Paula Ross, MD or one of the following Advanced Practice Providers on your designated Care Team:    Scott Weaver, PA-C  Vin Bhagat, PA-C   Other Instructions   

## 2020-07-09 DIAGNOSIS — Z20828 Contact with and (suspected) exposure to other viral communicable diseases: Secondary | ICD-10-CM | POA: Diagnosis not present

## 2020-07-10 ENCOUNTER — Other Ambulatory Visit: Payer: Self-pay | Admitting: Internal Medicine

## 2020-08-08 DIAGNOSIS — F439 Reaction to severe stress, unspecified: Secondary | ICD-10-CM | POA: Diagnosis not present

## 2020-08-08 DIAGNOSIS — Z7189 Other specified counseling: Secondary | ICD-10-CM | POA: Diagnosis not present

## 2020-08-08 DIAGNOSIS — R042 Hemoptysis: Secondary | ICD-10-CM | POA: Diagnosis not present

## 2020-08-09 DIAGNOSIS — N819 Female genital prolapse, unspecified: Secondary | ICD-10-CM | POA: Diagnosis not present

## 2020-08-09 DIAGNOSIS — Z01419 Encounter for gynecological examination (general) (routine) without abnormal findings: Secondary | ICD-10-CM | POA: Diagnosis not present

## 2020-08-09 DIAGNOSIS — Z960 Presence of urogenital implants: Secondary | ICD-10-CM | POA: Diagnosis not present

## 2020-08-21 DIAGNOSIS — L02212 Cutaneous abscess of back [any part, except buttock]: Secondary | ICD-10-CM | POA: Diagnosis not present

## 2020-08-23 DIAGNOSIS — Z23 Encounter for immunization: Secondary | ICD-10-CM | POA: Diagnosis not present

## 2020-08-23 DIAGNOSIS — L02212 Cutaneous abscess of back [any part, except buttock]: Secondary | ICD-10-CM | POA: Diagnosis not present

## 2020-10-23 ENCOUNTER — Telehealth: Payer: Self-pay | Admitting: Internal Medicine

## 2020-10-23 DIAGNOSIS — Z1231 Encounter for screening mammogram for malignant neoplasm of breast: Secondary | ICD-10-CM | POA: Diagnosis not present

## 2020-10-23 NOTE — Telephone Encounter (Signed)
Pt called in and stated she is thinking about getting the covid Vaccine and would like dr Charlott Rakes option on which one to get?  She wants to know which brand is best suited for her.     Best number [phone]

## 2020-10-23 NOTE — Telephone Encounter (Signed)
Pt advised Pfzier or Moderna. She appreciates our advisement

## 2020-10-25 DIAGNOSIS — Z7189 Other specified counseling: Secondary | ICD-10-CM | POA: Diagnosis not present

## 2020-11-27 DIAGNOSIS — R309 Painful micturition, unspecified: Secondary | ICD-10-CM | POA: Diagnosis not present

## 2021-01-03 DIAGNOSIS — J019 Acute sinusitis, unspecified: Secondary | ICD-10-CM | POA: Diagnosis not present

## 2021-01-18 DIAGNOSIS — M26629 Arthralgia of temporomandibular joint, unspecified side: Secondary | ICD-10-CM | POA: Diagnosis not present

## 2021-01-21 ENCOUNTER — Telehealth: Payer: Self-pay | Admitting: Internal Medicine

## 2021-01-21 MED ORDER — CEFDINIR 300 MG PO CAPS: 300.0000 mg | ORAL_CAPSULE | Freq: Two times a day (BID) | ORAL | 0 refills | Status: DC

## 2021-01-21 NOTE — Telephone Encounter (Signed)
pt is calling because she is experiencing ear pains, PCP prescribed zipac but that didn't help, she then went to ENT & had gotten OTC ear drops, pt was still experiencing pain.. she then was told it could be TMJ...  pt has some congestion so she doesnt think its relate to TMJ. Pt is wondering if its anything Dr. Maple Hudson could do because she is experiencing congestion. Pt is currently scheduled to come in 01/22/21 Please advise  [phone]

## 2021-01-21 NOTE — Telephone Encounter (Signed)
Called and spoke with patient. She verbalized understanding. RX has been called in for her.   Nothing further needed at time of call.

## 2021-01-21 NOTE — Progress Notes (Signed)
Patient ID: Crystal Yoder, female    DOB: May 20, 1946, 75 y.o.   MRN: [id number]   HPI F former smoker followed for OSA/ failed CPAP, allergic rhinitis, COPD/ chronic cough,  complicated by hx of GERD, peripheral venous insufficiency, OHS, Asplenia, CM/CHF, psoriasis NPSG 05/03/00 [place]- AHI 20.9, incomplete control on BIPAP 13/7, body weight 237 lbs PFT 2012-mild obstruction small airways, DLCO 54% Echocardiogram 08/27/2016-EF 40%-45%, , gr 1 diastolic dysfunction, chamber dilatation, PA peak 47 mmHg FENO 08/29/16  6- WNL -----------------------------------------------------------------------------------   01/23/20- 74 year old female former smoker followed for COPD/chronic cough, allergic rhinitis, OSA/failed CPAP, complicated by GERD, peripheral venous insufficiency, obesity, OHS, Asplenia, CM/CHF/ diast dysfunction, psoriasis -----f/u OSA/Cardiomyopathy. Patient stated she does have a cough when she wakes up .  Not using CPAP. Husband tells her she is not snoring.  ED visit 08/31/19 for fluid overload Rx'd lasix Dry cough on first waking.  She minimizes reflux symptoms. Some postnasal drip. Bothersome pressure discomfort R ear eval by Dr Rosen/ ENT who suggested she see dentist for TMJ. CXR 08/31/2019-  IMPRESSION: Scattered linear opacities favored to represent atelectasis. No evidence of overt edema or infection.  01/22/21- 75 year old female former smoker followed for COPD/chronic cough, allergic rhinitis, OSA/failed CPAP, complicated by GERD, peripheral venous insufficiency, obesity, OHS, Asplenia, CM/CHF/ diast dysfunction, psoriasis -Called 3/28 c/o sinus congestion and ear pain. PCP gave ZPAK. Rosen ENT dx'd TMJ. We called in omnicef. Covid vax- Flu vax-had -----60yr f/u. States she started the [facility] yesterday and tell a slight improvement in her breathing.  Timing suggests maybe pollen related, but omnicef was started based on phone complaint, then we got her in sooner than  expected. Husband with Alzheimers, fell jogging and broke ribs.> stress for her.  Denies any wheeze or cough.         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 HEENT:   No-   headaches, difficulty swallowing, tooth/dental problems, sore throat,                 + sneezing, itching, +ear ache, +nasal congestion, +post nasal drip,  CV:  No-   chest pain, orthopnea, PND, swelling in lower extremities, anasarca, dizziness, palpitations GI:  No-   heartburn, indigestion, abdominal pain, nausea, vomiting, diarrhea,                 change in bowel habits, loss of appetite Resp: + shortness of breath with exertion or at rest.  No-  excess mucus,             No-  coughing up of blood.   Persistent dry cough           change in color of mucus.  No- wheezing.   Skin: No-   rash or lesions. GU: No-   dysuria,  MS:  No-acute  joint pain or swelling. Psych:  No- change in mood or affect. + depression or anxiety.  No memory loss.  Objective:   Physical Exam  General- Alert, Oriented, Affect-appropriate, Distress- none acute    + Obese,  Skin- rash-none, lesions- none, excoriation- none Lymphadenopathy- none Head- atraumatic            Eyes- Gross vision intact, PERRLA, conjunctivae clear secretions  Ears- Hearing ok, + minor cerumen, +TMs look clear            Nose-  No-Septal dev, mucus, polyps, erosion, perforation             Throat- Mallampati III-IV , mucosa clear , drainage- none, tonsils- atrophic.  Neck- flexible , trachea midline, no stridor , thyroid nl, carotid no bruit Chest - symmetrical excursion , unlabored           Heart/CV- RRR , no murmur , no gallop  , no rub, nl s1 s2                           - JVD- none , edema+ trace, stasis changes- none, varices- none           Lung-  Wheeze-None,  cough-none; no cough with deep breath , dullness-none, rub- none           Chest wall-  Abd-  Br/ Gen/ Rectal- Not done, not indicated Extrem- cyanosis- none, clubbing, none, atrophy- none, strength- nl.  Superficial varices.  Neuro- grossly intact to observation

## 2021-01-21 NOTE — Telephone Encounter (Signed)
Primary Pulmonologist: Young Last office visit and with whom: 01/23/20 Dr. Maple Hudson What do we see them for (pulmonary problems): OSA, simple chronic bronchitis, chronic cough, non allergic rhinitis Last OV assessment/plan: Assessment & Plan Note by Waymon Budge, MD at 02/01/2020 10:56 AM  Author: Waymon Budge, MD Author Type: Physician Filed: 02/01/2020 10:58 AM  Note Status: Written Cosign: Cosign Not Required Encounter Date: 01/23/2020  Problem: Bronchitis, chronic (HCC)  Editor: Waymon Budge, MD (Physician)               Chest sounds clear and no acute event Dry cough is nonspecific, mainly on awakening and evidently not very bothersome.  Plan- cautioned to pay attention to nasal drainage, reflux, and potential correlates.        Assessment & Plan Note by Waymon Budge, MD at 02/01/2020 10:55 AM  Author: Waymon Budge, MD Author Type: Physician Filed: 02/01/2020 10:56 AM  Note Status: Written Cosign: Cosign Not Required Encounter Date: 01/23/2020  Problem: Obstructive sleep apnea  Editor: Waymon Budge, MD (Physician)               She has given up on CPAP and husband tells her she is not snoring. Plan- encourage conservative measures, esp weight loss and sleep position off back       Patient Instructions by Waymon Budge, MD at 01/23/2020 10:30 AM  Author: Waymon Budge, MD Author Type: Physician Filed: 01/23/2020 11:36 AM  Note Status: Signed Cosign: Cosign Not Required Encounter Date: 01/23/2020  Editor: Waymon Budge, MD (Physician)               Suggest using Sudafed - ask at pharmacy counter  Suggest trying otc Nasalcrom/ cromol/ cromolyn nasal spray as an alternative to try instead of Flonase, for comparison.  Please call if we can help        Instructions     Return in about 1 year (around 01/22/2021).  Suggest using Sudafed - ask at pharmacy counter  Suggest trying otc Nasalcrom/ cromol/ cromolyn nasal spray as an alternative to  try instead of Flonase, for comparison.  Please call if we can help        Was appointment offered to patient (explain)?  Has appointment already in place 01/22/21   Reason for call:   (examples of things to ask: : When did symptoms start? Fever? Cough? Productive? Color to sputum? More sputum than usual? Wheezing? Have you needed increased oxygen? Are you taking your respiratory medications? What over the counter measures have you tried?)  Allergies  Allergen Reactions  . Penicillins Hives and Other (See Comments)    Has patient had a PCN reaction causing immediate rash, facial/tongue/throat swelling, SOB or lightheadedness with hypotension: No Has patient had a PCN reaction causing severe rash involving mucus membranes or skin necrosis: No Has patient had a PCN reaction that required hospitalization: No Has patient had a PCN reaction occurring within the last 10 years: No If all of the above answers are "NO", then may proceed with Cephalosporin use.  . Sulfa Antibiotics Diarrhea, Nausea And Vomiting and Other (See Comments)    Other Reaction: GI Upset Other Reaction: GI Upset  Other Reaction: GI Upset  . Sulfonamide Derivatives Diarrhea and Nausea And Vomiting    Immunization History  Administered Date(s) Administered  . Influenza Split 07/28/2011, 07/27/2012, 08/13/2013  . Influenza Whole 09/02/2010  . Influenza, High Dose Seasonal PF 08/29/2016, 08/17/2017, 08/20/2018  . Meningococcal B, OMV  05/17/2019  . Meningococcal Mcv4o 05/17/2019

## 2021-01-21 NOTE — Telephone Encounter (Signed)
Called and spoke with patient, she states she has had sinus congestion and ear pain for several weeks.  She went to [facility] and was given a z-pack with no improvement.  She then went to Dr. Pollyann Kennedy (ENT) and was told she may have TMJ.  She is still having congestion and ear pain L>R, she also has some nausea at times, watery eyes and some itching in her right ear.  She does get up some mucus in the am, but could not tell me if it had any color to it.  Her voice is also raspy.  She has not had any of the covid vaccines, she did get her flu vaccine.  She has taken 3 covid tests that were all negative, the last one was less than a week ago.  She has not had any loss of taste or smell, denies fever, chills or body ages.  She denies any know sick contacts.  She has a schedule appointment with Dr. Maple Hudson on 01/22/21.  Dr. Maple Hudson, please advise.

## 2021-01-21 NOTE — Telephone Encounter (Signed)
Recommend we send omnicef 300 mg, # 14, 1 twice daily Also use otc saline sinus rinse (like NeilMed or Ayr, etc)  Antihistamine like claritin or allegra or zyrtec may help with drainage Keep appointment

## 2021-01-22 ENCOUNTER — Other Ambulatory Visit: Payer: Self-pay

## 2021-01-22 ENCOUNTER — Ambulatory Visit: Payer: Medicare PPO | Admitting: Internal Medicine

## 2021-01-22 ENCOUNTER — Encounter: Payer: Self-pay | Admitting: Internal Medicine

## 2021-01-22 DIAGNOSIS — J410 Simple chronic bronchitis: Secondary | ICD-10-CM | POA: Diagnosis not present

## 2021-01-22 DIAGNOSIS — J018 Other acute sinusitis: Secondary | ICD-10-CM

## 2021-01-22 DIAGNOSIS — G4733 Obstructive sleep apnea (adult) (pediatric): Secondary | ICD-10-CM

## 2021-01-22 NOTE — Assessment & Plan Note (Signed)
Treating possible sinusitis with eustachian dysfunction, but suspect this is seasonal allergic rhinitis Plan- finish omnicef. Suggest antihistamine, cautious use of sudafed if tolerated, flonase

## 2021-01-22 NOTE — Assessment & Plan Note (Signed)
Has gained weight Consider reassessment

## 2021-01-22 NOTE — Patient Instructions (Signed)
Finish the [employer] antibiotic, just in case  You can try otc flonase nasal spray- 1 or 2 puffs each nostril once daily. This will need a few days to work.  Ok to try an otc nonsedating antihistamine like claritin, allegra or zyrtec  Ok to try one of the otc ear drops. You do have a little wax.  Ok to use Mucinex- plain or DM (dextromethorphan)   Please call if we can help

## 2021-01-22 NOTE — Assessment & Plan Note (Signed)
Currently asymptomatic Plan- consider adding inhalers back based on symptoms

## 2021-02-27 ENCOUNTER — Other Ambulatory Visit: Payer: Self-pay | Admitting: Internal Medicine

## 2021-03-03 NOTE — Progress Notes (Signed)
Cardiology Office Note   Date:  03/05/2021   ID:  Crystal Yoder, Crystal Yoder 27-Jan-1946, MRN [id number]  PCP:  Laurann Montana, MD  Cardiologist:   Dietrich Pates, MD   F/U of NICM     History of Present Illness: Crystal Yoder is a 75 y.o. female with a history of HTN, NICM (LVEF 45 to 50%) GERD, HL, reactive airway disease and mild AS   SHe also has Hx of PVCs with Holter monitor in 2019showing PVC burden was 31%   Repeat echo showed LVEF was  30 to 35% with increased filling pressures   LA was dilated There was mild to mod AS.  With Decline in LVEF,  CT coronary angio showed coronary calcium score of 9   Normal coronary arteries  The PA was noted to be severely dilated at 53 mm    CT of lungs showed no PE   In Oct 2019 she underwent R heart cath  To evaluated for pulmonary HTN   Results:  Pressures RA: A-wave 12, V wave 12; mean 10 RV: 59/12 PA: 62/26; mean 37 PW: A-wave 20, V wave 22; mean 18 Repeat PA: 55/25; mean 36  Oxygen saturation in the pulmonary artery was 73% in the AO 96%.  By the Fick method, cardiac output was 7.1 L/min with a cardiac index of 3.1 L/min/m.  PVR: 2.66 WU   Lasix Rx was recommended  Last echo 11/2018:  By my review it LVEF  Was probably 35 to 40%    I saw the pt in Sept 2021 The pt says her breathing is stable    She denies CP  No palpitastions    The pt says that this spring she has had extensive sinus issues and cough  Went to ENT who didn't see much   Saw Dr Maple Hudson   Esp bad in AM    Allergies    Current Meds  Medication Sig  . carvedilol (COREG) 3.125 MG tablet TAKE 1 TABLET BY MOUTH DAILY  . ENTRESTO 24-26 MG TAKE 1 TABLET BY MOUTH TWICE DAILY  . furosemide (LASIX) 20 MG tablet Take 20 mg by mouth daily. You may take an extra as needed for increased weight gain, shortness of breath or swelling  . pantoprazole (PROTONIX) 40 MG tablet Take 40 mg by mouth daily.   . potassium chloride SA (KLOR-CON) 20 MEQ tablet TAKE 1 TABLET BY MOUTH DAILY      Allergies:   Penicillins, Sulfa antibiotics, and Sulfonamide derivatives   Past Medical History:  Diagnosis Date  . Arthritis   . Arthropathy, unspecified, site unspecified   . COPD (chronic obstructive pulmonary disease) (HCC)   . Esophageal reflux   . Essential hypertension 05/31/2017  . Obesity, unspecified   . Obstructive sleep apnea (adult) (pediatric)   . Other acute sinusitis   . Other primary cardiomyopathies   . Psoriasis   . Shortness of breath   . Unspecified venous (peripheral) insufficiency     Past Surgical History:  Procedure Laterality Date  . ABDOMINAL HYSTERECTOMY  2011  . BLADDER SURGERY  2011   tac=ant/post  . CHOLECYSTECTOMY N/A 06/01/2017   Procedure: LAPAROSCOPIC CHOLECYSTECTOMY;  Surgeon: Gaynelle Adu, MD;  Location: [facility];  Service: General;  Laterality: N/A;  . IRRIGATION AND DEBRIDEMENT OF WOUND WITH SPLIT THICKNESS SKIN GRAFT Right 01/25/2014   Procedure: RIGHT FOOT IRRIGATION AND DEBRIDEMENT WITH ACELL/SPLICK THICKNESS SKINGRAFT WITH VAC;  Surgeon: Wayland Denis, DO;  Location: [facility];  Service: Plastics;  Laterality: Right;  . PELVIC FLOOR REPAIR    . REPLACEMENT TOTAL KNEE     rt and lt  . RIGHT HEART CATH N/A 07/28/2018   Procedure: RIGHT HEART CATH;  Surgeon: Lennette Bihari, MD;  Location: [facility] INVASIVE CV LAB;  Service: Cardiovascular;  Laterality: N/A;  . 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 Her smoking use included cigarettes. She has a 5.00 pack-year smoking history. She has never used smokeless tobacco. She reports current alcohol use. She reports that she does not use drugs.   Family History:  The patient's family history includes Diabetes in her paternal grandfather; Diverticulitis in her father; Prostate cancer in her father.    ROS:  Please see the history of present illness. All other systems are reviewed and  Negative to the  above problem except as noted.    PHYSICAL EXAM: VS:  BP 126/64   Pulse 80   Ht 5\' 8"  (1.727 m)   Wt 256 lb 9.6 oz (116.4 kg)   SpO2 97%   BMI 39.02 kg/m   GEN: Morbidly obese 75 yo in no acute distress  HEENT: normal  Neck: JVP is not elevated  Cardiac: RRR; no murmurs,  No LE edema Respiratory:  clear to auscultation bilaterally GI: soft, nontender.  NO obvious hepatomegaly  Skin: Chronic stasis changes legs  Neuro:  Strength and sensation are intact Psych: euthymic mood, full affect   EKG:  EKG is not done today   Echo  12/08/18 1. The left ventricle has mildly reduced systolic function, with an  ejection fraction of 45-50%. The cavity size was normal. Left ventricular  diastolic Doppler parameters are consistent with impaired relaxation There  is abnormal septal motion consistent  with left bundle branch block. Left ventricular diffuse hypokinesis.  2. The right ventricle has normal systolic function. The cavity was  normal. There is no increase in right ventricular wall thickness.  3. Left atrial size was mildly dilated.  4. The mitral valve is normal in structure. There is mild thickening and  mild calcification.  5. The tricuspid valve is normal in structure.  6. The aortic valve is tricuspid There is mild thickening and mild  calcification of the aortic valve.  7. The pulmonic valve was normal in structure.  8. There is mild dilatation of the ascending aorta measuring 38 mm.  9. Pulmonary hypertension is moderate.  10. The inferior vena cava was normal in size with <50% respiratory  variability.  11. Right atrial pressure is estimated at 8 mmHg.    Lipid Panel    Component Value Date/Time   CHOL 198 11/05/2017 1220   TRIG 81 11/05/2017 1220   TRIG 75 10/23/2006 0812   HDL 73 11/05/2017 1220   CHOLHDL 2.7 11/05/2017 1220   CHOLHDL 3 10/31/2013 1440   VLDL 8.4 10/31/2013 1440   LDLCALC 109 (H) 11/05/2017 1220   LDLDIRECT 121.5 10/31/2013 1440       Wt Readings from Last 3 Encounters:  03/05/21 256 lb 9.6 oz (116.4 kg)  01/22/21 260 lb 6.4 oz (118.1 kg)  06/29/20 263 lb (119.3 kg)      ASSESSMENT AND PLAN:  1   NICM   Volume status looks good   I would increase carvedilol to 3.125 bid   Continue Entresto  She has been sensidtive to meds  Last echo in 2020   Plan to get F/U in Dec when shen comes again with another echo   2  HTN  BP is OK   Has room for additional Coreg  (BID)  3  Lipids  LDL 117  HDL 67   Will get today   4  PVCs Continue Coreg    F/U in November/December Signed, Dietrich Pates, MD  03/05/2021 1:57 PM    [facility] [address] [place], [place], [place]  [address] Phone: [phone]; Fax: [phone]

## 2021-03-05 ENCOUNTER — Encounter: Payer: Self-pay | Admitting: Internal Medicine

## 2021-03-05 ENCOUNTER — Other Ambulatory Visit: Payer: Self-pay

## 2021-03-05 ENCOUNTER — Ambulatory Visit: Payer: Medicare PPO | Admitting: Internal Medicine

## 2021-03-05 VITALS — BP 126/64 | HR 80 | Ht 68.0 in | Wt 256.6 lb

## 2021-03-05 DIAGNOSIS — I428 Other cardiomyopathies: Secondary | ICD-10-CM | POA: Diagnosis not present

## 2021-03-05 DIAGNOSIS — E782 Mixed hyperlipidemia: Secondary | ICD-10-CM | POA: Diagnosis not present

## 2021-03-05 DIAGNOSIS — I493 Ventricular premature depolarization: Secondary | ICD-10-CM

## 2021-03-05 DIAGNOSIS — I10 Essential (primary) hypertension: Secondary | ICD-10-CM

## 2021-03-05 MED ORDER — CARVEDILOL 3.125 MG PO TABS: 3.1250 mg | ORAL_TABLET | Freq: Two times a day (BID) | ORAL | 3 refills | Status: DC

## 2021-03-05 NOTE — Addendum Note (Signed)
Addended by: Lendon Ka on: 03/05/2021 04:14 PM   Modules accepted: Orders

## 2021-03-05 NOTE — Patient Instructions (Addendum)
Medication Instructions:  Your physician has recommended you make the following change in your medication:  1.) Take carvedilol (Coreg) 3.125 mg twice daily instead of daily  *If you need a refill on your cardiac medications before your next appointment, please call your pharmacy*  Lab Work: CBC, BMET, TSH, LIPID  If you have labs (blood work) drawn today and your tests are completely normal, you will receive your results only by: Marland Kitchen MyChart Message (if you have MyChart) OR . A paper copy in the mail If you have any lab test that is abnormal or we need to change your treatment, we will call you to review the results.   Testing/Procedures:  DEC, 2022, BEFORE NEXT APPOINTMENT W DR. Tenny Craw. Your physician has requested that you have an echocardiogram. Echocardiography is a painless test that uses sound waves to create images of your heart. It provides your doctor with information about the size and shape of your heart and how well your heart's chambers and valves are working. This procedure takes approximately one hour. There are no restrictions for this procedure.   Follow-Up: At [facility], you and your health needs are our priority.  As part of our continuing mission to provide you with exceptional heart care, we have created designated Provider Care Teams.  These Care Teams include your primary Cardiologist (physician) and Advanced Practice Providers (APPs -  Physician Assistants and Nurse Practitioners) who all work together to provide you with the care you need, when you need it.   Your next appointment:   8 month(s) (December) -have echocardiogram prior  The format for your next appointment:   In Person  Provider:   You may see Dietrich Pates, MD or one of the following Advanced Practice Providers on your designated Care Team:    Tereso Newcomer, PA-C  Chelsea Aus, [place]   Other Instructions

## 2021-03-06 LAB — BASIC METABOLIC PANEL
BUN/Creatinine Ratio: 11 — ABNORMAL LOW (ref 12–28)
BUN: 13 mg/dL (ref 8–27)
CO2: 26 mmol/L (ref 20–29)
Calcium: 9.3 mg/dL (ref 8.7–10.3)
Chloride: 104 mmol/L (ref 96–106)
Creatinine, Ser: 1.14 mg/dL — ABNORMAL HIGH (ref 0.57–1.00)
Glucose: 117 mg/dL — ABNORMAL HIGH (ref 65–99)
Potassium: 4.3 mmol/L (ref 3.5–5.2)
Sodium: 142 mmol/L (ref 134–144)
eGFR: 50 mL/min/{1.73_m2} — ABNORMAL LOW (ref >59)

## 2021-03-06 LAB — CBC
Hematocrit: 41.2 % (ref 34.0–46.6)
Hemoglobin: 13.4 g/dL (ref 11.1–15.9)
MCH: 30.8 pg (ref 26.6–33.0)
MCHC: 32.5 g/dL (ref 31.5–35.7)
MCV: 95 fL (ref 79–97)
Platelets: 177 10*3/uL (ref 150–450)
RBC: 4.35 x10E6/uL (ref 3.77–5.28)
RDW: 12.6 % (ref 11.7–15.4)
WBC: 5.8 10*3/uL (ref 3.4–10.8)

## 2021-03-06 LAB — LIPID PANEL
Chol/HDL Ratio: 2.7 ratio (ref 0.0–4.4)
Cholesterol, Total: 196 mg/dL (ref 100–199)
HDL: 73 mg/dL (ref >39)
LDL Chol Calc (NIH): 104 mg/dL — ABNORMAL HIGH (ref 0–99)
Triglycerides: 111 mg/dL (ref 0–149)
VLDL Cholesterol Cal: 19 mg/dL (ref 5–40)

## 2021-03-06 LAB — TSH: TSH: 1.88 u[IU]/mL (ref 0.450–4.500)

## 2021-04-08 ENCOUNTER — Other Ambulatory Visit: Payer: Self-pay | Admitting: Internal Medicine

## 2021-04-11 DIAGNOSIS — N819 Female genital prolapse, unspecified: Secondary | ICD-10-CM | POA: Diagnosis not present

## 2021-04-11 DIAGNOSIS — Z960 Presence of urogenital implants: Secondary | ICD-10-CM | POA: Diagnosis not present

## 2021-04-11 DIAGNOSIS — R309 Painful micturition, unspecified: Secondary | ICD-10-CM | POA: Diagnosis not present

## 2021-04-16 ENCOUNTER — Telehealth: Payer: Self-pay | Admitting: Internal Medicine

## 2021-04-16 NOTE — Telephone Encounter (Signed)
Lab results forwarded via Epic function.  Pt aware.

## 2021-04-16 NOTE — Telephone Encounter (Signed)
PT is calling requesting her blood work that was ordered by [facility] faxed to [facility] (Dr.White) at [phone].PT states she is going to get a physical done on Thursday 04/18/2021 and does not want to duplicate blood work

## 2021-04-18 ENCOUNTER — Other Ambulatory Visit: Payer: Self-pay | Admitting: Family Medicine

## 2021-04-18 DIAGNOSIS — Z1231 Encounter for screening mammogram for malignant neoplasm of breast: Secondary | ICD-10-CM

## 2021-04-18 DIAGNOSIS — E559 Vitamin D deficiency, unspecified: Secondary | ICD-10-CM | POA: Diagnosis not present

## 2021-04-18 DIAGNOSIS — E2839 Other primary ovarian failure: Secondary | ICD-10-CM

## 2021-04-18 DIAGNOSIS — E538 Deficiency of other specified B group vitamins: Secondary | ICD-10-CM | POA: Diagnosis not present

## 2021-04-18 DIAGNOSIS — Z0000 Encounter for general adult medical examination without abnormal findings: Secondary | ICD-10-CM | POA: Diagnosis not present

## 2021-04-18 DIAGNOSIS — N1831 Chronic kidney disease, stage 3a: Secondary | ICD-10-CM | POA: Diagnosis not present

## 2021-04-18 DIAGNOSIS — Z1389 Encounter for screening for other disorder: Secondary | ICD-10-CM | POA: Diagnosis not present

## 2021-04-18 DIAGNOSIS — E785 Hyperlipidemia, unspecified: Secondary | ICD-10-CM | POA: Diagnosis not present

## 2021-04-18 DIAGNOSIS — I420 Dilated cardiomyopathy: Secondary | ICD-10-CM | POA: Diagnosis not present

## 2021-04-18 DIAGNOSIS — I700 Atherosclerosis of aorta: Secondary | ICD-10-CM | POA: Diagnosis not present

## 2021-04-25 DIAGNOSIS — R1084 Generalized abdominal pain: Secondary | ICD-10-CM | POA: Diagnosis not present

## 2021-04-25 DIAGNOSIS — K5900 Constipation, unspecified: Secondary | ICD-10-CM | POA: Diagnosis not present

## 2021-05-01 ENCOUNTER — Other Ambulatory Visit: Payer: Self-pay | Admitting: Family Medicine

## 2021-05-01 DIAGNOSIS — R140 Abdominal distension (gaseous): Secondary | ICD-10-CM

## 2021-05-10 ENCOUNTER — Ambulatory Visit
Admission: RE | Admit: 2021-05-10 | Discharge: 2021-05-10 | Disposition: A | Discharge status: Home / Self Care | Payer: Medicare PPO | Source: Ambulatory Visit | Attending: Family Medicine | Admitting: Family Medicine

## 2021-05-10 DIAGNOSIS — N281 Cyst of kidney, acquired: Secondary | ICD-10-CM | POA: Diagnosis not present

## 2021-05-10 DIAGNOSIS — K5730 Diverticulosis of large intestine without perforation or abscess without bleeding: Secondary | ICD-10-CM | POA: Diagnosis not present

## 2021-05-10 DIAGNOSIS — R140 Abdominal distension (gaseous): Secondary | ICD-10-CM

## 2021-05-10 DIAGNOSIS — K439 Ventral hernia without obstruction or gangrene: Secondary | ICD-10-CM | POA: Diagnosis not present

## 2021-05-10 DIAGNOSIS — M4316 Spondylolisthesis, lumbar region: Secondary | ICD-10-CM | POA: Diagnosis not present

## 2021-05-10 MED ORDER — IOPAMIDOL (ISOVUE-300) INJECTION 61%
100.0000 mL | Freq: Once | INTRAVENOUS | Status: AC | PRN
  Administered 2021-05-10: 100 mL via INTRAVENOUS

## 2021-05-22 DIAGNOSIS — R0981 Nasal congestion: Secondary | ICD-10-CM | POA: Diagnosis not present

## 2021-05-22 DIAGNOSIS — U071 COVID-19: Secondary | ICD-10-CM | POA: Diagnosis not present

## 2021-05-22 DIAGNOSIS — R197 Diarrhea, unspecified: Secondary | ICD-10-CM | POA: Diagnosis not present

## 2021-05-22 DIAGNOSIS — R519 Headache, unspecified: Secondary | ICD-10-CM | POA: Diagnosis not present

## 2021-05-24 ENCOUNTER — Ambulatory Visit (HOSPITAL_COMMUNITY)
Admission: EM | Admit: 2021-05-24 | Discharge: 2021-05-24 | Disposition: A | Discharge status: Home / Self Care | Payer: Medicare PPO | Attending: Student | Admitting: Student

## 2021-05-24 ENCOUNTER — Other Ambulatory Visit: Payer: Self-pay

## 2021-05-24 ENCOUNTER — Encounter (HOSPITAL_COMMUNITY): Payer: Self-pay | Admitting: Emergency Medicine

## 2021-05-24 ENCOUNTER — Ambulatory Visit (INDEPENDENT_AMBULATORY_CARE_PROVIDER_SITE_OTHER): Payer: Medicare PPO

## 2021-05-24 DIAGNOSIS — R059 Cough, unspecified: Secondary | ICD-10-CM | POA: Diagnosis not present

## 2021-05-24 DIAGNOSIS — U071 COVID-19: Secondary | ICD-10-CM

## 2021-05-24 DIAGNOSIS — I517 Cardiomegaly: Secondary | ICD-10-CM | POA: Diagnosis not present

## 2021-05-24 DIAGNOSIS — J42 Unspecified chronic bronchitis: Secondary | ICD-10-CM

## 2021-05-24 DIAGNOSIS — R0602 Shortness of breath: Secondary | ICD-10-CM

## 2021-05-24 DIAGNOSIS — J9811 Atelectasis: Secondary | ICD-10-CM | POA: Diagnosis not present

## 2021-05-24 DIAGNOSIS — I429 Cardiomyopathy, unspecified: Secondary | ICD-10-CM | POA: Diagnosis not present

## 2021-05-24 NOTE — ED Triage Notes (Signed)
Pt presents with SOB, cough, and Positive COVID result xs 3 days. States was sent by PCP for Chest X-Ray.   States started taking Antiviral on Wed pm.

## 2021-05-24 NOTE — Discharge Instructions (Addendum)
Your Xray looks normal:  1. No acute cardiopulmonary disease. 2. Cardiomegaly without congestive failure (*this is consistent with your diagnosis of cardiomyopathy)  -Continue Paxlovid -You can take tylenol 1000mg  3x daily. Look at the ingredients in Mucinex- some formulations contain Tylenol. You can take up to 3000mg  total of Tylenol daily -Follow-up with or PCP if symptoms worsen, like shortness of breath, fevers, weakness, dizziness, chest pain

## 2021-05-24 NOTE — ED Provider Notes (Signed)
[facility]    CSN: [id number] Arrival date & time: 05/24/21  1555      History   Chief Complaint Chief Complaint  Patient presents with   Covid Positive   Cough   Shortness of Breath    HPI Crystal Yoder is a 75 y.o. female presenting with Covid-19. Medical history COPD, hypertension, obesity, , cardiomyopathy.  Tested positive for COVID 3 days ago and was started on Paxlovid by her primary care.  She has not had COVID vaccination.  Has also been using Delsym and Tylenol.  States she has some shortness of breath, hacking cough productive of clear and yellow sputum.  Denies chest pain, dizziness, weakness. Denies  n/v/d, chest pain,  facial pain, teeth pain, headaches, sore throat, loss of taste/smell, swollen lymph nodes, ear pain.    HPI  Past Medical History:  Diagnosis Date   Arthritis    Arthropathy, unspecified, site unspecified    COPD (chronic obstructive pulmonary disease) (HCC)    Esophageal reflux    Essential hypertension 05/31/2017   Obesity, unspecified    Obstructive sleep apnea (adult) (pediatric)    Other acute sinusitis    Other primary cardiomyopathies    Psoriasis    Shortness of breath    Unspecified venous (peripheral) insufficiency     Patient Active Problem List   Diagnosis Date Noted   Pulmonary hypertension, unspecified (HCC)    Acute cholecystitis 05/31/2017   Essential hypertension 05/31/2017   Acute calculous cholecystitis 05/31/2017   Acute cystitis without hematuria    Psoriasis 08/29/2016   Primary osteoarthritis of both hands 08/29/2016   Primary osteoarthritis of both feet 08/29/2016   Total knee replacement status, bilateral 08/29/2016   Neuropathy 08/29/2016   Vitamin D deficiency 08/29/2016   H/O splenectomy 08/29/2016   Foot ulcer, right (HCC) 01/25/2014   Acute exacerbation of chronic bronchitis (HCC) 10/17/2013   Chronic cough 05/13/2011   Bronchitis, chronic (HCC) 08/20/2010   Nonallergic rhinitis 04/11/2010    Hyperlipidemia 07/30/2009   Obesity 12/02/2007   Obstructive sleep apnea 12/02/2007   Cardiomyopathy (HCC) 12/02/2007   UNSPECIFIED VENOUS INSUFFICIENCY 12/02/2007   RHINOSINUSITIS, ACUTE 12/02/2007   GASTROESOPHAGEAL REFLUX DISEASE 12/02/2007   ARTHRITIS 12/02/2007    Past Surgical History:  Procedure Laterality Date   ABDOMINAL HYSTERECTOMY  2011   BLADDER SURGERY  2011   tac=ant/post   CHOLECYSTECTOMY N/A 06/01/2017   Procedure: LAPAROSCOPIC CHOLECYSTECTOMY;  Surgeon: Gaynelle Adu, MD;  Location: [facility];  Service: General;  Laterality: N/A;   IRRIGATION AND DEBRIDEMENT OF WOUND WITH SPLIT THICKNESS SKIN GRAFT Right 01/25/2014   Procedure: RIGHT FOOT IRRIGATION AND DEBRIDEMENT WITH ACELL/SPLICK THICKNESS SKINGRAFT WITH VAC;  Surgeon: Wayland Denis, DO;  Location: [facility];  Service: Plastics;  Laterality: Right;   PELVIC FLOOR REPAIR     REPLACEMENT TOTAL KNEE     rt and lt   RIGHT HEART CATH N/A 07/28/2018   Procedure: RIGHT HEART CATH;  Surgeon: Lennette Bihari, MD;  Location: [facility]VASIVE CV LAB;  Service: Cardiovascular;  Laterality: N/A;   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      Home Medications    Prior to Admission medications   Medication Sig Start Date End Date Taking? Authorizing Provider  carvedilol (COREG) 3.125 MG tablet Take 1 tablet (3.125 mg total) by mouth 2 (two) times daily with a meal. 03/05/21   Pricilla Riffle, MD  ENTRESTO 24-26 MG TAKE 1 TABLET BY MOUTH TWICE DAILY 02/27/21   Pricilla Riffle, MD  furosemide (LASIX) 20 MG tablet Take 20 mg by mouth daily. You may take an extra as needed for increased weight gain, shortness of breath or swelling    [provider]  pantoprazole (PROTONIX) 40 MG tablet Take 40 mg by mouth daily.     [provider]  potassium chloride SA (KLOR-CON) 20 MEQ tablet TAKE 1 TABLET BY MOUTH DAILY 04/09/21   Pricilla Riffle, MD    Family History Family  History  Problem Relation Age of Onset   Prostate cancer Father    Diverticulitis Father    Diabetes Paternal Grandfather     Social History Social History   Tobacco Use   Smoking status: Former    Packs/day: 0.50    Years: 10.00    Pack years: 5.00    Types: Cigarettes    Quit date: 10/28/1979    Years since quitting: 41.6   Smokeless tobacco: Never  Vaping Use   Vaping Use: Never used  Substance Use Topics   Alcohol use: Yes    Comment: rare   Drug use: No     Allergies   Penicillins, Sulfa antibiotics, and Sulfonamide derivatives   Review of Systems Review of Systems  Constitutional:  Positive for chills and fever. Negative for appetite change.  HENT:  Positive for congestion. Negative for ear pain, rhinorrhea, sinus pressure, sinus pain and sore throat.   Eyes:  Negative for redness and visual disturbance.  Respiratory:  Positive for cough and shortness of breath. Negative for chest tightness and wheezing.   Cardiovascular:  Negative for chest pain and palpitations.  Gastrointestinal:  Negative for abdominal pain, constipation, diarrhea, nausea and vomiting.  Genitourinary:  Negative for dysuria, frequency and urgency.  Musculoskeletal:  Negative for myalgias.  Neurological:  Negative for dizziness, weakness and headaches.  Psychiatric/Behavioral:  Negative for confusion.   All other systems reviewed and are negative.   Physical Exam Triage Vital Signs ED Triage Vitals  Enc Vitals Group     BP 05/24/21 1632 (!) 110/41     Pulse Rate 05/24/21 1632 66     Resp 05/24/21 1632 18     Temp 05/24/21 1632 100.1 F (37.8 C)     Temp Source 05/24/21 1632 Oral     SpO2 05/24/21 1632 96 %     Weight --      Height --      Head Circumference --      Peak Flow --      Pain Score 05/24/21 1628 0     Pain Loc --      Pain Edu? --      Excl. in GC? --    No data found.  Updated Vital Signs BP (!) 110/41 (BP Location: Right Arm)   Pulse 66   Temp 100.1 F (37.8  C) (Oral)   Resp 18   SpO2 96%   Visual Acuity Right Eye Distance:   Left Eye Distance:   Bilateral Distance:    Right Eye Near:   Left Eye Near:    Bilateral Near:     Physical Exam Vitals reviewed.  Constitutional:      General: She is not in acute distress.    Appearance: Normal appearance. She is not ill-appearing.  HENT:     Head: Normocephalic and atraumatic.     Right Ear: Tympanic membrane, ear canal and external ear  normal. No tenderness. No middle ear effusion. There is no impacted cerumen. Tympanic membrane is not perforated, erythematous, retracted or bulging.     Left Ear: Tympanic membrane, ear canal and external ear normal. No tenderness.  No middle ear effusion. There is no impacted cerumen. Tympanic membrane is not perforated, erythematous, retracted or bulging.     Nose: Nose normal. No congestion.     Mouth/Throat:     Mouth: Mucous membranes are moist.     Pharynx: Uvula midline. No oropharyngeal exudate or posterior oropharyngeal erythema.  Eyes:     Extraocular Movements: Extraocular movements intact.     Pupils: Pupils are equal, round, and reactive to light.  Cardiovascular:     Rate and Rhythm: Normal rate and regular rhythm.     Heart sounds: Normal heart sounds.  Pulmonary:     Effort: Pulmonary effort is normal.     Breath sounds: Decreased breath sounds present. No wheezing, rhonchi or rales.     Comments: Decreased breath sounds throughout Abdominal:     Palpations: Abdomen is soft.     Tenderness: There is no abdominal tenderness. There is no guarding or rebound.  Neurological:     General: No focal deficit present.     Mental Status: She is alert and oriented to person, place, and time.  Psychiatric:        Mood and Affect: Mood normal.        Behavior: Behavior normal.        Thought Content: Thought content normal.        Judgment: Judgment normal.     UC Treatments / Results  Labs (all labs ordered are listed, but only abnormal  results are displayed) Labs Reviewed - No data to display  EKG   Radiology DG Chest 2 View  Result Date: 05/24/2021 CLINICAL DATA:  COVID positive.  Shortness of breath and cough EXAM: CHEST - 2 VIEW COMPARISON:  08/31/2019 FINDINGS: Hyperinflation. Midline trachea. Mild cardiomegaly with tortuous thoracic aorta. Pulmonary artery enlargement. No pleural effusion or pneumothorax. No congestive failure. No lobar consolidation. Mild bibasilar atelectasis. IMPRESSION: 1. No acute cardiopulmonary disease. 2. Cardiomegaly without congestive failure. 3. Pulmonary artery enlargement suggests pulmonary arterial hypertension. Electronically Signed   By: Jeronimo Greaves M.D.   On: 05/24/2021 17:06    Procedures Procedures (including critical care time)  Medications Ordered in UC Medications - No data to display  Initial Impression / Assessment and Plan / UC Course  I have reviewed the triage vital signs and the nursing notes.  Pertinent labs & imaging results that were available during my care of the patient were reviewed by me and considered in my medical decision making (see chart for details).     This patient is a very pleasant 75 y.o. year old female presenting with Covid-19 and shortness of breath.  She is febrile but nontachycardic.  She does have a history of COPD. This patient is already on day 3 of Paxlovid as prescribed by PCP.   CXR 1. No acute cardiopulmonary disease. 2. Cardiomegaly without congestive failure. 3. Pulmonary artery enlargement suggests pulmonary arterial hypertension.  Continue Paxlovid, OTC medications. F/u with PCP.  Strict ED return precautions discussed. Patient verbalizes understanding and agreement.    Final Clinical Impressions(s) / UC Diagnoses   Final diagnoses:  COVID-19  Cardiomyopathy, unspecified type (HCC)  Chronic bronchitis, unspecified chronic bronchitis type [facility])     Discharge Instructions      Your Xray looks normal:  1.  No acute  cardiopulmonary disease. 2. Cardiomegaly without congestive failure (*this is consistent with your diagnosis of cardiomyopathy)  -Continue Paxlovid -You can take tylenol 1000mg  3x daily. Look at the ingredients in Mucinex- some formulations contain Tylenol. You can take up to 3000mg  total of Tylenol daily -Follow-up with or PCP if symptoms worsen, like shortness of breath, fevers, weakness, dizziness, chest pain     ED Prescriptions   None    PDMP not reviewed this encounter.   , PA-C 05/24/21 1749

## 2021-06-18 DIAGNOSIS — Z8616 Personal history of COVID-19: Secondary | ICD-10-CM | POA: Diagnosis not present

## 2021-08-21 ENCOUNTER — Other Ambulatory Visit: Payer: Self-pay

## 2021-08-21 MED ORDER — ENTRESTO 24-26 MG PO TABS: 1.0000 | ORAL_TABLET | Freq: Two times a day (BID) | ORAL | 2 refills | Status: DC

## 2021-08-23 DIAGNOSIS — Z23 Encounter for immunization: Secondary | ICD-10-CM | POA: Diagnosis not present

## 2021-08-23 DIAGNOSIS — E785 Hyperlipidemia, unspecified: Secondary | ICD-10-CM | POA: Diagnosis not present

## 2021-09-06 ENCOUNTER — Telehealth: Payer: Self-pay | Admitting: Internal Medicine

## 2021-09-06 ENCOUNTER — Other Ambulatory Visit: Payer: Self-pay

## 2021-09-06 ENCOUNTER — Encounter: Payer: Self-pay | Admitting: Internal Medicine

## 2021-09-06 ENCOUNTER — Ambulatory Visit
Admission: RE | Admit: 2021-09-06 | Discharge: 2021-09-06 | Disposition: A | Discharge status: Home / Self Care | Payer: Medicare PPO | Source: Ambulatory Visit | Attending: Internal Medicine | Admitting: Internal Medicine

## 2021-09-06 ENCOUNTER — Ambulatory Visit: Payer: Medicare PPO | Admitting: Internal Medicine

## 2021-09-06 VITALS — BP 116/76 | HR 53 | Ht 68.0 in | Wt 257.6 lb

## 2021-09-06 DIAGNOSIS — R600 Localized edema: Secondary | ICD-10-CM

## 2021-09-06 DIAGNOSIS — R7989 Other specified abnormal findings of blood chemistry: Secondary | ICD-10-CM

## 2021-09-06 DIAGNOSIS — M79672 Pain in left foot: Secondary | ICD-10-CM

## 2021-09-06 LAB — D-DIMER, QUANTITATIVE: D-DIMER: 3.6 mg/L FEU — ABNORMAL HIGH (ref 0.00–0.49)

## 2021-09-06 MED ORDER — APIXABAN 5 MG PO TABS: 5.0000 mg | ORAL_TABLET | Freq: Two times a day (BID) | ORAL | 0 refills | Status: DC

## 2021-09-06 NOTE — Progress Notes (Signed)
Cardiology Office Note   Date:  09/06/2021   ID:  Crystal Yoder, Crystal Yoder 1945/12/20, MRN [id number]  PCP:  Laurann Montana, MD  Cardiologist:   Dietrich Pates, MD   F/U of NICM  COmes in today for L foot swelling      History of Present Illness: Crystal Yoder is a 75 y.o. female with a history of HTN, NICM (LVEF 45 to 50%) GERD, HL, reactive airway disease and mild AS   SHe also has Hx of PVCs with Holter monitor in 2019showing PVC burden was 31%   Repeat echo showed LVEF was  30 to 35% with increased filling pressures   LA was dilated There was mild to mod AS.  With Decline in LVEF,  CT coronary angio showed coronary calcium score of 9   Normal coronary arteries  The PA was noted to be severely dilated at 53 mm    CT of lungs showed no PE   In Oct 2019 she underwent R heart cath  To evaluated for pulmonary HTN   Results:  Pressures RA: A-wave 12, V wave 12; mean 10 RV: 59/12 PA: 62/26; mean 37 PW: A-wave 20, V wave 22; mean 18 Repeat PA: 55/25; mean 36  Oxygen saturation in the pulmonary artery was 73% in the AO 96%.  By the Fick method, cardiac output was 7.1 L/min with a cardiac index of 3.1 L/min/m.  PVR: 2.66 WU   Lasix Rx was recommended  Last echo 11/2018:  By my review it LVEF  Was probably 35 to 40%   I saw the pt in May 2022  The pt comes in today complaining of L foot/ankle pain and swelling  She says that she fell on Monday  Did not hurt initially but hurts now.  Denies SOB  No dizziness  No CP     Current Meds  Medication Sig   carvedilol (COREG) 3.125 MG tablet Take 1 tablet (3.125 mg total) by mouth 2 (two) times daily with a meal.   furosemide (LASIX) 20 MG tablet Take 20 mg by mouth daily. You may take an extra as needed for increased weight gain, shortness of breath or swelling   pantoprazole (PROTONIX) 40 MG tablet Take 40 mg by mouth daily.    potassium chloride SA (KLOR-CON) 20 MEQ tablet TAKE 1 TABLET BY MOUTH DAILY   sacubitril-valsartan (ENTRESTO)  24-26 MG Take 1 tablet by mouth 2 (two) times daily.     Allergies:   Penicillins, Sulfa antibiotics, and Sulfonamide derivatives   Past Medical History:  Diagnosis Date   Arthritis    Arthropathy, unspecified, site unspecified    COPD (chronic obstructive pulmonary disease) (HCC)    Esophageal reflux    Essential hypertension 05/31/2017   Obesity, unspecified    Obstructive sleep apnea (adult) (pediatric)    Other acute sinusitis    Other primary cardiomyopathies    Psoriasis    Shortness of breath    Unspecified venous (peripheral) insufficiency     Past Surgical History:  Procedure Laterality Date   ABDOMINAL HYSTERECTOMY  2011   BLADDER SURGERY  2011   tac=ant/post   CHOLECYSTECTOMY N/A 06/01/2017   Procedure: LAPAROSCOPIC CHOLECYSTECTOMY;  Surgeon: Gaynelle Adu, MD;  Location: [facility];  Service: General;  Laterality: N/A;   IRRIGATION AND DEBRIDEMENT OF WOUND WITH SPLIT THICKNESS SKIN GRAFT Right 01/25/2014   Procedure: RIGHT FOOT IRRIGATION AND DEBRIDEMENT WITH ACELL/SPLICK THICKNESS SKINGRAFT WITH VAC;  Surgeon: Wayland Denis, DO;  Location: [facility];  Service: Plastics;  Laterality: Right;   PELVIC FLOOR REPAIR     REPLACEMENT TOTAL KNEE     rt and lt   RIGHT HEART CATH N/A 07/28/2018   Procedure: RIGHT HEART CATH;  Surgeon: Lennette Bihari, MD;  Location: [facility];  Service: Cardiovascular;  Laterality: N/A;   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 Her smoking use included cigarettes. She has a 5.00 pack-year smoking history. She has never used smokeless tobacco. She reports current alcohol use. She reports that she does not use drugs.   Family History:  The patient's family history includes Diabetes in her paternal grandfather; Diverticulitis in her father; Prostate cancer in her father.    ROS:  Please see the history of present illness. All other systems are  reviewed and  Negative to the above problem except as noted.    PHYSICAL EXAM: VS:  BP 116/76   Pulse (!) 53   Ht 5\' 8"  (1.727 m)   Wt 257 lb 9.6 oz (116.8 kg)   SpO2 97%   BMI 39.17 kg/m   GEN: Morbidly obese 75 yo in no acute distress  HEENT: normal  Neck: JVP is not elevated  Cardiac: RRR; no murmurs,  L Leg swollen at ankle   skin Red   Tender  Respiratory:  clear to auscultation bilaterally GI: soft, nontender.  NO obvious hepatomegaly  Skin: Chronic stasis changes legs  Neuro:  Strength and sensation are intact Psych: euthymic mood, full affect   EKG:  EKG is not done today   Echo  12/08/18  1. The left ventricle has mildly reduced systolic function, with an  ejection fraction of 45-50%. The cavity size was normal. Left ventricular  diastolic Doppler parameters are consistent with impaired relaxation There  is abnormal septal motion consistent   with left bundle branch block. Left ventricular diffuse hypokinesis.   2. The right ventricle has normal systolic function. The cavity was  normal. There is no increase in right ventricular wall thickness.   3. Left atrial size was mildly dilated.   4. The mitral valve is normal in structure. There is mild thickening and  mild calcification.   5. The tricuspid valve is normal in structure.   6. The aortic valve is tricuspid There is mild thickening and mild  calcification of the aortic valve.   7. The pulmonic valve was normal in structure.   8. There is mild dilatation of the ascending aorta measuring 38 mm.   9. Pulmonary hypertension is moderate.  10. The inferior vena cava was normal in size with <50% respiratory  variability.  11. Right atrial pressure is estimated at 8 mmHg.    Lipid Panel    Component Value Date/Time   CHOL 196 03/05/2021 1447   TRIG 111 03/05/2021 1447   TRIG 75 10/23/2006 0812   HDL 73 03/05/2021 1447   CHOLHDL 2.7 03/05/2021 1447   CHOLHDL 3 10/31/2013 1440   VLDL 8.4 10/31/2013 1440    LDLCALC 104 (H) 03/05/2021 1447   LDLDIRECT 121.5 10/31/2013 1440      Wt Readings from Last 3 Encounters:  09/06/21 257 lb 9.6 oz (116.8 kg)  03/05/21 256 lb 9.6 oz (116.4 kg)  01/22/21 260 lb 6.4 oz (118.1 kg)      ASSESSMENT AND PLAN:  1  Left foot/ankle swelling  Pt with fall on Monday  Tender    Will set up for foot film   Check d dimer     ? DVT  2   NICM   Volume status looks good   Volume status is OK   Keep on current regimen     3  HTN  BP is controlled    Continue current regimen     3  Lipids  LDL 111  HDL 66    4  PVCs Continue Coreg    Signed, Dietrich Pates, MD  09/06/2021 1:46 PM    [facility] [address] [place], [place], [place]  [address] Phone: [phone]; Fax: [phone]

## 2021-09-06 NOTE — Patient Instructions (Signed)
Medication Instructions:    Your physician recommends that you continue on your current medications as directed. Please refer to the Current Medication list given to you today.  *If you need a refill on your cardiac medications before your next appointment, please call your pharmacy*   Lab Work:  BMET D-DIMER AND PRO BNP TODAY    If you have labs (blood work) drawn today and your tests are completely normal, you will receive your results only by: MyChart Message (if you have MyChart) OR A paper copy in the mail If you have any lab test that is abnormal or we need to change your treatment, we will call you to review the results.   Testing/Procedures:  YOU HAVE RECOMMENDED TO GO TO [facility]  FOR YOUR FOOT PAIN .  [address] [place], [place], [place] [address]  [phone] Open  Closes 5 PM (Veterans Day, hours may vary) Days of week Open hours  Friday 8 AM - 5 PM Veterans Day, hours may vary  Saturday Closed  Sunday Closed  Monday 8 AM - 5 PM  Tuesday 8 AM - 5 PM  Wednesday 8 AM - 5 PM  Thursday 8 AM - 5 PM   Follow-Up: At [facility], you and your health needs are our priority.  As part of our continuing mission to provide you with exceptional heart care, we have created designated Provider Care Teams.  These Care Teams include your primary Cardiologist (physician) and Advanced Practice Providers (APPs -  Physician Assistants and Nurse Practitioners) who all work together to provide you with the care you need, when you need it.  We recommend signing up for the patient portal called "MyChart".  Sign up information is provided on this After Visit Summary.  MyChart is used to connect with patients for Virtual Visits (Telemedicine).  Patients are able to view lab/test results, encounter notes, upcoming appointments, etc.  Non-urgent messages can be sent to your provider as well.   To learn more about what you can do with MyChart, go to  [email or web address].    Your next appointment: AS SCHEDULED    The format for your next appointment:   In Person  Provider:   Dietrich Pates, MD {   Other Instructions

## 2021-09-06 NOTE — Telephone Encounter (Signed)
Patient called again, she would like to be seen today.

## 2021-09-06 NOTE — Telephone Encounter (Signed)
Pt c/o swelling: STAT is pt has developed SOB within 24 hours  If swelling, where is the swelling located? Left foot  How much weight have you gained and in what time span? Not sure  Have you gained 3 pounds in a day or 5 pounds in a week? Not sure  Do you have a log of your daily weights (if so, list)? no  Are you currently taking a fluid pill? Yes, but has not taken today because she does not want to be going to the bathroom a lot  Are you currently SOB? no  Have you traveled recently? no   Patient states she has swelling in her left foot and it is very painful. She requests a call back as soon as possible. She states she can hardly get up to go to the bathroom. She says she has not taken her lasix, because she does not want to have to keep getting up to go to the bathroom. She says she has her foot elevated but even that is painful. She states she is not sure if this is related, but she also says her left hand has also been bothering her. She says it hurts when she uses it a certain way. She says she put it on the window of her car and said that was excruciating. She would like to be called back as soon as possible.

## 2021-09-06 NOTE — Telephone Encounter (Signed)
Spoke with pt and pt did have some pizza Monday night and noted some edema Per pt this has worsened and is very painful to walk on so pt did not take Furosemide this am due to having to ambulate to the bathroom Pt notes some swelling to right foot but not as much Pt states she has not injured foot Pt to be seen today by Dr Tenny Craw at 1:20 pm .Crystal Yoder

## 2021-09-08 LAB — BASIC METABOLIC PANEL
BUN/Creatinine Ratio: 18 (ref 12–28)
BUN: 16 mg/dL (ref 8–27)
CO2: 20 mmol/L (ref 20–29)
Calcium: 9.3 mg/dL (ref 8.7–10.3)
Chloride: 104 mmol/L (ref 96–106)
Creatinine, Ser: 0.87 mg/dL (ref 0.57–1.00)
Glucose: 84 mg/dL (ref 70–99)
Potassium: 5 mmol/L (ref 3.5–5.2)
Sodium: 141 mmol/L (ref 134–144)
eGFR: 69 mL/min/{1.73_m2} (ref >59)

## 2021-09-08 LAB — PRO B NATRIURETIC PEPTIDE: NT-Pro BNP: 886 pg/mL — ABNORMAL HIGH (ref 0–738)

## 2021-09-09 ENCOUNTER — Ambulatory Visit (HOSPITAL_COMMUNITY)
Admission: RE | Admit: 2021-09-09 | Discharge: 2021-09-09 | Disposition: A | Discharge status: Home / Self Care | Payer: Medicare PPO | Source: Ambulatory Visit | Attending: Internal Medicine | Admitting: Internal Medicine

## 2021-09-09 ENCOUNTER — Other Ambulatory Visit: Payer: Self-pay

## 2021-09-09 DIAGNOSIS — R600 Localized edema: Secondary | ICD-10-CM | POA: Insufficient documentation

## 2021-09-09 DIAGNOSIS — R7989 Other specified abnormal findings of blood chemistry: Secondary | ICD-10-CM | POA: Diagnosis not present

## 2021-09-10 DIAGNOSIS — M792 Neuralgia and neuritis, unspecified: Secondary | ICD-10-CM | POA: Diagnosis not present

## 2021-09-10 DIAGNOSIS — M25571 Pain in right ankle and joints of right foot: Secondary | ICD-10-CM | POA: Diagnosis not present

## 2021-09-10 DIAGNOSIS — R600 Localized edema: Secondary | ICD-10-CM | POA: Diagnosis not present

## 2021-09-10 DIAGNOSIS — S93402A Sprain of unspecified ligament of left ankle, initial encounter: Secondary | ICD-10-CM | POA: Diagnosis not present

## 2021-09-10 DIAGNOSIS — R262 Difficulty in walking, not elsewhere classified: Secondary | ICD-10-CM | POA: Diagnosis not present

## 2021-09-10 DIAGNOSIS — S96912A Strain of unspecified muscle and tendon at ankle and foot level, left foot, initial encounter: Secondary | ICD-10-CM | POA: Diagnosis not present

## 2021-09-24 DIAGNOSIS — R600 Localized edema: Secondary | ICD-10-CM | POA: Diagnosis not present

## 2021-09-24 DIAGNOSIS — S96912A Strain of unspecified muscle and tendon at ankle and foot level, left foot, initial encounter: Secondary | ICD-10-CM | POA: Diagnosis not present

## 2021-09-24 DIAGNOSIS — R262 Difficulty in walking, not elsewhere classified: Secondary | ICD-10-CM | POA: Diagnosis not present

## 2021-09-24 DIAGNOSIS — M25571 Pain in right ankle and joints of right foot: Secondary | ICD-10-CM | POA: Diagnosis not present

## 2021-09-24 DIAGNOSIS — S93422D Sprain of deltoid ligament of left ankle, subsequent encounter: Secondary | ICD-10-CM | POA: Diagnosis not present

## 2021-09-24 DIAGNOSIS — M792 Neuralgia and neuritis, unspecified: Secondary | ICD-10-CM | POA: Diagnosis not present

## 2021-09-30 ENCOUNTER — Ambulatory Visit (HOSPITAL_COMMUNITY): Payer: Medicare PPO | Attending: Cardiovascular Disease

## 2021-09-30 ENCOUNTER — Other Ambulatory Visit: Payer: Self-pay

## 2021-09-30 DIAGNOSIS — I493 Ventricular premature depolarization: Secondary | ICD-10-CM | POA: Insufficient documentation

## 2021-09-30 DIAGNOSIS — I428 Other cardiomyopathies: Secondary | ICD-10-CM | POA: Insufficient documentation

## 2021-09-30 DIAGNOSIS — I10 Essential (primary) hypertension: Secondary | ICD-10-CM | POA: Diagnosis not present

## 2021-09-30 DIAGNOSIS — E782 Mixed hyperlipidemia: Secondary | ICD-10-CM | POA: Diagnosis not present

## 2021-09-30 LAB — ECHOCARDIOGRAM COMPLETE
Area-P 1/2: 2.69 cm2
S' Lateral: 4 cm

## 2021-10-02 DIAGNOSIS — Z01411 Encounter for gynecological examination (general) (routine) with abnormal findings: Secondary | ICD-10-CM | POA: Diagnosis not present

## 2021-10-02 DIAGNOSIS — N819 Female genital prolapse, unspecified: Secondary | ICD-10-CM | POA: Diagnosis not present

## 2021-10-02 DIAGNOSIS — Z960 Presence of urogenital implants: Secondary | ICD-10-CM | POA: Diagnosis not present

## 2021-10-10 ENCOUNTER — Telehealth: Payer: Self-pay | Admitting: Internal Medicine

## 2021-10-10 NOTE — Telephone Encounter (Signed)
Patient is returning call to discuss echo results. °

## 2021-10-10 NOTE — Telephone Encounter (Signed)
Left message to call back  

## 2021-10-11 NOTE — Telephone Encounter (Signed)
Good morning Crystal Yoder, I could not leave you a message your voicemail was full but here are the results of your Echocardiogram. Please call if you have any questions. Dr. Tenny Craw says she can talk more about it with you at your 11/27/21 office visit.   Dr. Tenny Craw' note: Your heart function is down a little 45-50% but she says no changes in your medications to keep taking what you are on.    Otherwise everything else looked good.    [name]  Pt reviewed the above information and her results via MyChart 10/10/2021 at 1:21 pm. (After this phone call)  Will close this encounter and await a return call from pt should she have any further questions.

## 2021-10-15 DIAGNOSIS — M792 Neuralgia and neuritis, unspecified: Secondary | ICD-10-CM | POA: Diagnosis not present

## 2021-10-15 DIAGNOSIS — M25571 Pain in right ankle and joints of right foot: Secondary | ICD-10-CM | POA: Diagnosis not present

## 2021-10-15 DIAGNOSIS — R262 Difficulty in walking, not elsewhere classified: Secondary | ICD-10-CM | POA: Diagnosis not present

## 2021-10-15 DIAGNOSIS — R600 Localized edema: Secondary | ICD-10-CM | POA: Diagnosis not present

## 2021-10-15 DIAGNOSIS — S96912A Strain of unspecified muscle and tendon at ankle and foot level, left foot, initial encounter: Secondary | ICD-10-CM | POA: Diagnosis not present

## 2021-10-15 DIAGNOSIS — M65872 Other synovitis and tenosynovitis, left ankle and foot: Secondary | ICD-10-CM | POA: Diagnosis not present

## 2021-10-15 DIAGNOSIS — S93422D Sprain of deltoid ligament of left ankle, subsequent encounter: Secondary | ICD-10-CM | POA: Diagnosis not present

## 2021-10-16 ENCOUNTER — Telehealth: Payer: Self-pay | Admitting: Internal Medicine

## 2021-10-16 DIAGNOSIS — M79672 Pain in left foot: Secondary | ICD-10-CM | POA: Diagnosis not present

## 2021-10-16 NOTE — Telephone Encounter (Signed)
Spoke with the patient who states that has continued to have left foot pain and swelling since she saw Dr. Tenny Craw. She has seen a podiatrist, however nothing has resolved. She states that over the past couple of days she has been in a lot of pain. Her podiatrist was concerned that the patient might have gout. She advised the patient that Lasix could aggravate gout so the patient has not taken her Lasix yesterday or today. She states that she was also concerned with taking it because its very painful for her to walk so she doesn't want to have to get up to use the bathroom frequently. She states that her podiatrist got her in to see her PCP this afternoon to be evaluated for gout. Patient wanted to let Dr. Tenny Craw know about this. She will update [place] after her appointment today.

## 2021-10-16 NOTE — Telephone Encounter (Signed)
Pt c/o medication issue:  1. Name of Medication: furosemide (LASIX) 20 MG tablet  2. How are you currently taking this medication (dosage and times per day)?   3. Are you having a reaction (difficulty breathing--STAT)?   4. What is your medication issue?  Patient called to let Dr. Tenny Craw know that she didn't take her lasix yesterday or today.  She is going to her PCP today to get tested for gout. She went to foot doctor yesterday and was told that lasix could cause the gout.  There have been studies that women in there 70's can get gout from it.  She wanted to let Dr. Tenny Craw know she was probably going to stop taking it.

## 2021-10-17 NOTE — Telephone Encounter (Signed)
Pt advised per the Pharmacist and Dr. Tenny Craw:   No drug drug interactions between prednisone and current cardiac medications. May increase her BP however.  She will call back if she has further problems or questions.

## 2021-10-17 NOTE — Telephone Encounter (Signed)
Patient does have gout, she wants to make sure the medication prednisone that was prescribed to her is okay to take along with her heart medication.

## 2021-10-17 NOTE — Telephone Encounter (Signed)
Spoke with pt and advised per PharmD there are no interactions between prednisone and current cardiac medications.  Pt verbalizes understanding and thanked Charity fundraiser for the call.

## 2021-10-17 NOTE — Telephone Encounter (Signed)
No drug drug interactions between prednisone and current cardiac medications. May increase her BP however.

## 2021-10-23 DIAGNOSIS — N189 Chronic kidney disease, unspecified: Secondary | ICD-10-CM | POA: Diagnosis not present

## 2021-10-23 DIAGNOSIS — K219 Gastro-esophageal reflux disease without esophagitis: Secondary | ICD-10-CM | POA: Diagnosis not present

## 2021-10-23 DIAGNOSIS — M1990 Unspecified osteoarthritis, unspecified site: Secondary | ICD-10-CM | POA: Diagnosis not present

## 2021-10-23 DIAGNOSIS — R32 Unspecified urinary incontinence: Secondary | ICD-10-CM | POA: Diagnosis not present

## 2021-10-23 DIAGNOSIS — R609 Edema, unspecified: Secondary | ICD-10-CM | POA: Diagnosis not present

## 2021-10-23 DIAGNOSIS — M792 Neuralgia and neuritis, unspecified: Secondary | ICD-10-CM | POA: Diagnosis not present

## 2021-10-23 DIAGNOSIS — I129 Hypertensive chronic kidney disease with stage 1 through stage 4 chronic kidney disease, or unspecified chronic kidney disease: Secondary | ICD-10-CM | POA: Diagnosis not present

## 2021-10-23 DIAGNOSIS — I429 Cardiomyopathy, unspecified: Secondary | ICD-10-CM | POA: Diagnosis not present

## 2021-10-30 ENCOUNTER — Telehealth: Payer: Self-pay | Admitting: Internal Medicine

## 2021-10-30 NOTE — Telephone Encounter (Signed)
Pt called to advise that she is still having pain in her foot. Pt wants to know if  she can start taking Allupurinol and if so, will interfere with her heart meds. She was told about this medicine through a friend of a friend

## 2021-10-30 NOTE — Telephone Encounter (Signed)
Pt advised and will let us know if her MD plans to prescribe it so we can add to her med list.

## 2021-10-30 NOTE — Telephone Encounter (Signed)
Allopurinol is used to help prevent gout flares. Should not have any interactions with her cardiac medications

## 2021-11-01 DIAGNOSIS — M79672 Pain in left foot: Secondary | ICD-10-CM | POA: Diagnosis not present

## 2021-11-01 DIAGNOSIS — M25472 Effusion, left ankle: Secondary | ICD-10-CM | POA: Diagnosis not present

## 2021-11-01 DIAGNOSIS — M25572 Pain in left ankle and joints of left foot: Secondary | ICD-10-CM | POA: Diagnosis not present

## 2021-11-04 NOTE — Telephone Encounter (Signed)
Would Rx allopurinal 100 mg    F/U BMET and uric acid in 3 wks

## 2021-11-06 ENCOUNTER — Telehealth: Payer: Self-pay | Admitting: Licensed Clinical Social Worker

## 2021-11-06 NOTE — Telephone Encounter (Addendum)
I spoke with the pt and her PCP started her on Colchicine and has plans to see them back in a few weeks. Pt will call if anything changes or worsens. She says she has had some improvement since starting the new med.   Pt to see Dr. Tenny Craw 11/27/21.

## 2021-11-06 NOTE — Telephone Encounter (Signed)
LCSW received staff message from [place], [place], who shares that pt having stress w/ caring for herself, her husband and her home. LCSW reached out to pt via telephone today at [phone]. Pt shares she is busy getting her husband ready for the dentist. Requested I call back either today at 2pm or tomorrow at 11am.   Octavio Graves, MSW, LCSW Clinical Social Worker II [facility] Heart/Vascular Care Navigation  [phone]- work cell phone (preferred) [phone]- desk phone

## 2021-11-07 ENCOUNTER — Telehealth: Payer: Self-pay | Admitting: Licensed Clinical Social Worker

## 2021-11-07 NOTE — Telephone Encounter (Signed)
Called pt a bit before 11am at [phone] in an attempt to reach her to discuss home needs referral. LCSW was unable to reach pt, left voicemail for pt to return my call at her earliest convenience.   Westley Hummer, MSW, [place]  [phone]- work cell phone (preferred) [phone]- desk phone

## 2021-11-08 NOTE — Progress Notes (Signed)
Heart and Vascular Care Navigation  11/08/2021  Crystal Yoder 1946/05/31 [id number]  Reason for Referral:  General financial stress, caregiving stress. Engaged with patient by telephone for initial visit for Heart and Vascular Care Coordination.                                                                                                   Assessment:                                     LCSW received a call back from pt. Introduced self, role, reason for call. Confirmed home address, PCP, and emergency contacts. Pt lives at home with her husband, who she shares has Alzheimer's, and her adult son Crystal Yoder who works as a Lawyer. Crystal Yoder is around when he is not working, but when he is at work pt shares that she and her husband have a hard time managing everything. They moved a few times and "downsized" but struggle with the cleaning and organizing of the things that they moved there with them. Pt shares that financially they also have challenges managing it all- however they both have SSI, pension, and additional income coming in, so likely they will be limited in their ability to access assistance programs. They have access to food, transportation, and none of their utilities have been noted to be near turn off.   LCSW provided support for feelings of being overwhelmed that pt is experiencing at this time. LCSW shares community resources including the following: SHIIP line to help w/ Medicare questions, transportation assistance options, day programs and caregiver support through [employer], utility assistance programs, in home care options and information about [facility] with [place] care coordination services. Pt very appreciative of these options. Pt requested that I text her Age with Grace's number and mail her all other information.   HRT/VAS Care Coordination     Patients Home Cardiology Office [facility]   Outpatient Care Team Social Worker   Social Worker Name: Crystal Yoder, [place]  [facility] [phone]   Living arrangements for the past 2 months Single Family Home   Lives with: Spouse; Adult Children   Patient Current Insurance Coverage Managed Medicare   Patient Has Concern With Paying Medical Bills No   Does Patient Have Prescription Coverage? Yes   Home Assistive Devices/Equipment Cane (specify quad or straight)   DME Agency NA   HH Agency [facility]       Social History:                                                                             SDOH Screenings   Alcohol Screen: Not on file  Depression (GMW1-0): Not  on file  Financial Resource Strain: Medium Risk   Difficulty of Paying Living Expenses: Somewhat hard  Food Insecurity: No Food Insecurity   Worried About Running Out of Food in the Last Year: Never true   Ran Out of Food in the Last Year: Never true  Housing: Low Risk    Last Housing Risk Score: 0  Physical Activity: Not on file  Social Connections: Not on file  Stress: Stress Concern Present   Feeling of Stress : Very much  Tobacco Use: Medium Risk   Smoking Tobacco Use: Former   Smokeless Tobacco Use: Never   Passive Exposure: Not on file  Transportation Needs: No Transportation Needs   Lack of Transportation (Medical): No   Lack of Transportation (Non-Medical): No    SDOH Interventions: Financial Resources:  Financial Strain Interventions: Other (Comment) (mailed LIHEAP/LIHWAP applications, mailed SHIIP information/extra help)   Food Insecurity:  Food Insecurity Interventions: Intervention Not Indicated  Housing Insecurity:  Housing Interventions: Other (Comment) (general stress around finances; mailed utility assistance program applications)  Transportation:   Transportation Interventions: [employer], Other (Comment) (mailed information about Retail banker and potential Medicare transportation options)    Other Care Navigation Interventions:     Provided  Pharmacy assistance resources  Mailed information regarding Extra Help program  Patient expressed Mental Health concerns Yes, Referred to:  caregiver support, day programs for her husband, assistance programs   Follow-up plan:   I texted pt the Age with [facility] contact number, I have also mailed the discussed resources. I included my card and encouraged pt to reach out as needed for any additional questions or support.

## 2021-11-14 ENCOUNTER — Other Ambulatory Visit: Payer: Self-pay | Admitting: Internal Medicine

## 2021-11-20 ENCOUNTER — Telehealth: Payer: Self-pay | Admitting: Internal Medicine

## 2021-11-20 NOTE — Telephone Encounter (Signed)
Pt advised she can take anything OTC for her congestion. Reviewed w/ pharmD. Patient verbalized understanding and agreeable to plan.

## 2021-11-20 NOTE — Telephone Encounter (Signed)
Pt would like to know what is ok to take for congestion... please advise

## 2021-11-21 DIAGNOSIS — M25472 Effusion, left ankle: Secondary | ICD-10-CM | POA: Diagnosis not present

## 2021-11-26 NOTE — Progress Notes (Signed)
Cardiology Office Note   Date:  11/27/2021   ID:  Crystal Yoder, Crystal Yoder [birth year], MRN [id number]  PCP:  Harlan Stains, MD  Cardiologist:   Dorris Carnes, MD   Patient presents for F/U of NICM    History of Present Illness: Crystal Yoder is a 76 y.o. female with a history of HTN, NICM (LVEF 45 to 50%) GERD, HL, reactive airway disease and mild AS   SHe also has Hx of PVCs with Holter monitor in 2019showing PVC burden was 31%   Repeat echo showed LVEF was  30 to 35% with increased filling pressures   LA was dilated There was mild to mod AS.  With Decline in LVEF,  CT coronary angio showed coronary calcium score of 9   Normal coronary arteries  The PA was noted to be severely dilated at 53 mm    CT of lungs showed no PE   In Oct 2019 she underwent R heart cath  To evaluated for pulmonary HTN   Results:  Pressures RA: A-wave 12, V wave 12; mean 10 RV: 59/12 PA: 62/26; mean 37 PW: A-wave 20, V wave 22; mean 18 Repeat PA: 55/25; mean 36  Oxygen saturation in the pulmonary artery was 73% in the AO 96%.  By the Fick method, cardiac output was 7.1 L/min with a cardiac index of 3.1 L/min/m.  PVR: 2.66 WU     I saw the pt in Nov 0222  She had significant swelling   LE dppler neg for DVT  Seen by a podiatrist     Uric acid 7        Rx prednisone   Not much effect Seen by Lewanda Rife   Colchine helped a little bit   Still in pain    Denies CP   Breathing is stable    No outpatient medications have been marked as taking for the 11/27/21 encounter (Office Visit) with Fay Records, MD.     Allergies:   Penicillins, Sulfa antibiotics, and Sulfonamide derivatives   Past Medical History:  Diagnosis Date   Arthritis    Arthropathy, unspecified, site unspecified    COPD (chronic obstructive pulmonary disease) ([place])    Esophageal reflux    Essential hypertension 05/31/2017   Obesity, unspecified    Obstructive sleep apnea (adult) (pediatric)    Other acute sinusitis    Other primary  cardiomyopathies    Psoriasis    Shortness of breath    Unspecified venous (peripheral) insufficiency     Past Surgical History:  Procedure Laterality Date   ABDOMINAL HYSTERECTOMY  2011   BLADDER SURGERY  2011   tac=ant/post   CHOLECYSTECTOMY N/A 06/01/2017   Procedure: LAPAROSCOPIC CHOLECYSTECTOMY;  Surgeon: Greer Pickerel, MD;  Location: [facility];  Service: General;  Laterality: N/A;   IRRIGATION AND DEBRIDEMENT OF WOUND WITH SPLIT THICKNESS SKIN GRAFT Right 01/25/2014   Procedure: RIGHT FOOT IRRIGATION AND DEBRIDEMENT WITH ACELL/SPLICK THICKNESS SKINGRAFT WITH VAC;  Surgeon: Theodoro Kos, DO;  Location: [facility];  Service: Plastics;  Laterality: Right;   PELVIC FLOOR REPAIR     REPLACEMENT TOTAL KNEE     rt and lt   RIGHT HEART CATH N/A 07/28/2018   Procedure: RIGHT HEART CATH;  Surgeon: Troy Sine, MD;  Location: [facility];  Service: Cardiovascular;  Laterality: N/A;   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 Her smoking use included cigarettes. She has a 5.00 pack-year smoking history. She has never used smokeless tobacco. She reports current alcohol use. She reports that she does not use drugs.   Family History:  The patient's family history includes Diabetes in her paternal grandfather; Diverticulitis in her father; Prostate cancer in her father.    ROS:  Please see the history of present illness. All other systems are reviewed and  Negative to the above problem except as noted.    PHYSICAL EXAM: VS:  BP 120/74    Pulse (!) 41    Ht 5\' 8"  (1.727 m)    Wt 257 lb (116.6 kg)    SpO2 93%    BMI 39.08 kg/m   GEN: Morbidly obese 76 yo in no acute distress  HEENT: normal  Neck: JVP is not elevated  Cardiac: RRR; no murmurs,   Triv LE edema Respiratory:  clear to auscultation bilaterally GI: soft, nontender.  NO obvious hepatomegaly  Skin: Chronic stasis changes legs   Neuro:  Strength and sensation are intact Psych: euthymic mood, full affect   EKG:  EKG is done today   SR  65 bpm  LBBB  Echo   09/30/21  Diffuse hypokinesis abnormal septal motion ? LBBB EF similar to TTE done 12/08/18. Left ventricular ejection fraction, by estimation, is 45 to 50%. The left ventricle has mildly decreased function. The left ventricle has no regional wall motion abnormalities. The left ventricular internal cavity size was mildly dilated. Left ventricular diastolic parameters were normal. 1. 2. Right ventricular systolic function is normal. The right ventricular size is normal. 3. Left atrial size was moderately dilated. The mitral valve is abnormal. Mild mitral valve regurgitation. No evidence of mitral stenosis. 4. The aortic valve is normal in structure. Aortic valve regurgitation is not visualized. No aortic stenosis is present. 5. The inferior vena cava is normal in size with greater than 50% respiratory variability, suggesting right atrial pressure of 3 mmHg. Echo  12/08/18  Lipid Panel    Component Value Date/Time   CHOL 196 03/05/2021 1447   TRIG 111 03/05/2021 1447   TRIG 75 10/23/2006 0812   HDL 73 03/05/2021 1447   CHOLHDL 2.7 03/05/2021 1447   CHOLHDL 3 10/31/2013 1440   VLDL 8.4 10/31/2013 1440   LDLCALC 104 (H) 03/05/2021 1447   LDLDIRECT 121.5 10/31/2013 1440      Wt Readings from Last 3 Encounters:  11/27/21 257 lb (116.6 kg)  09/06/21 257 lb 9.6 oz (116.8 kg)  03/05/21 256 lb 9.6 oz (116.4 kg)      ASSESSMENT AND PLAN:  1   HFmrEF   Volume status overall appears OK   She asked if she could come off of lasix given elevated uric acid   I am reluctant    I think she will retain too much       2  HTN  BP is controlled    Keep on  current regimen     3  Lipids  LDL 111  HDL 66    4    PVCs Pt deneis palpitations  Follw  6  Hx gout  Check uric acid    Signed, Dorris Carnes, MD  11/27/2021 3:04 PM    [facility] [place], [place], [place]  [address] Phone: [phone]; Fax: [phone]

## 2021-11-27 ENCOUNTER — Ambulatory Visit: Payer: Medicare PPO | Admitting: Internal Medicine

## 2021-11-27 ENCOUNTER — Encounter: Payer: Self-pay | Admitting: Internal Medicine

## 2021-11-27 ENCOUNTER — Other Ambulatory Visit: Payer: Self-pay

## 2021-11-27 VITALS — BP 120/74 | HR 41 | Ht 68.0 in | Wt 257.0 lb

## 2021-11-27 DIAGNOSIS — M79672 Pain in left foot: Secondary | ICD-10-CM

## 2021-11-27 DIAGNOSIS — R7989 Other specified abnormal findings of blood chemistry: Secondary | ICD-10-CM | POA: Diagnosis not present

## 2021-11-27 DIAGNOSIS — R600 Localized edema: Secondary | ICD-10-CM | POA: Diagnosis not present

## 2021-11-27 DIAGNOSIS — I10 Essential (primary) hypertension: Secondary | ICD-10-CM | POA: Diagnosis not present

## 2021-11-27 NOTE — Patient Instructions (Signed)
Medication Instructions:  Your physician recommends that you continue on your current medications as directed. Please refer to the Current Medication list given to you today.  *If you need a refill on your cardiac medications before your next appointment, please call your pharmacy*   Lab Work: URIC ACID, BMET, BNP, SED RATE TODAY  If you have labs (blood work) drawn today and your tests are completely normal, you will receive your results only by: MyChart Message (if you have MyChart) OR A paper copy in the mail If you have any lab test that is abnormal or we need to change your treatment, we will call you to review the results.   Testing/Procedures: none   Follow-Up: At [facility], you and your health needs are our priority.  As part of our continuing mission to provide you with exceptional heart care, we have created designated Provider Care Teams.  These Care Teams include your primary Cardiologist (physician) and Advanced Practice Providers (APPs -  Physician Assistants and Nurse Practitioners) who all work together to provide you with the care you need, when you need it.  We recommend signing up for the patient portal called "MyChart".  Sign up information is provided on this After Visit Summary.  MyChart is used to connect with patients for Virtual Visits (Telemedicine).  Patients are able to view lab/test results, encounter notes, upcoming appointments, etc.  Non-urgent messages can be sent to your provider as well.   To learn more about what you can do with MyChart, go to [email or web address].    Your next appointment:   6 month(s)  The format for your next appointment:   In Person  Provider:   Dietrich Pates, MD     Other Instructions

## 2021-11-28 LAB — BASIC METABOLIC PANEL
BUN/Creatinine Ratio: 21 (ref 12–28)
BUN: 20 mg/dL (ref 8–27)
CO2: 25 mmol/L (ref 20–29)
Calcium: 9.3 mg/dL (ref 8.7–10.3)
Chloride: 104 mmol/L (ref 96–106)
Creatinine, Ser: 0.96 mg/dL (ref 0.57–1.00)
Glucose: 96 mg/dL (ref 70–99)
Potassium: 4.7 mmol/L (ref 3.5–5.2)
Sodium: 141 mmol/L (ref 134–144)
eGFR: 61 mL/min/{1.73_m2} (ref >59)

## 2021-11-28 LAB — SEDIMENTATION RATE: Sed Rate: 24 mm/hr (ref 0–40)

## 2021-11-28 LAB — PRO B NATRIURETIC PEPTIDE: NT-Pro BNP: 1574 pg/mL — ABNORMAL HIGH (ref 0–738)

## 2021-11-28 LAB — URIC ACID: Uric Acid: 7.2 mg/dL (ref 3.1–7.9)

## 2021-12-02 ENCOUNTER — Telehealth: Payer: Self-pay

## 2021-12-02 NOTE — Telephone Encounter (Signed)
Pt med list updated per Dr. Tenny Craw' note on the pts result note.

## 2021-12-02 NOTE — Telephone Encounter (Signed)
-----   Message from Fay Records, MD sent at 11/29/2021  5:16 PM EST ----- Called patient  Electrolytes are OK  Kidney function is normal  Fluid does appear to be up so Uric acid [place] upper normal   Watch sugars Fluid is up some   She took lasix   Can take  1 extra lasix 1x per wk (40 mg) Limit sugar

## 2021-12-09 ENCOUNTER — Telehealth: Payer: Self-pay | Admitting: *Deleted

## 2021-12-09 NOTE — Telephone Encounter (Signed)
° °  Name: Crystal Yoder  DOB: 17-Oct-1946  MRN: [id number]   Primary Cardiologist: Dorris Carnes, MD  Chart reviewed as part of pre-operative protocol coverage. Patient was contacted 12/09/2021 in reference to pre-operative risk assessment for pending surgery as outlined below.  GLENDOLYN Yoder was last seen on 11/27/21 by Dr. Harrington Challenger, primarily followed for NICM, PVCs (7% by last monitor 2020 with brief runs NSVT max 4 beats and NS atrial tachycardia). 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 [place] 2019 with moderate pulm HTN. Echo 09/2021 EF 45-50% similar to 2020, mild MR. Recently saw Dr. Harrington Challenger. HR 41 by intake vitals but reported to be NSR 65bpm LBBB (upload not in Epic). At that visit, volume status looked OK. Recommended to continue current meds and see back in 6 months. I reached out to patient for update on how she is doing. The patient affirms she has been doing well without any new cardiac symptoms. Therefore, based on ACC/AHA guidelines, the patient would be at acceptable risk for the planned procedure without further cardiovascular testing. The patient was advised that if she develops new symptoms prior to surgery to contact our office to arrange for a follow-up visit, and she verbalized understanding.  She inquired about the type of prep she will be receiving (pills vs liquid) and I recommended she contact her GI team to discuss.  Will route this bundled recommendation to requesting provider via Epic fax function. Please call with questions.  Charlie Pitter, PA-C 12/09/2021, 4:34 PM

## 2021-12-09 NOTE — Telephone Encounter (Signed)
° °  Pre-operative Risk Assessment    Patient Name: Crystal Yoder  DOB: [birth year] MRN: [id number]      Request for Surgical Clearance    Procedure:   COLONOSCOPY  Date of Surgery:  Clearance 02/25/22                                 Surgeon:  DR. [facility] Surgeon's Group or Practice Name:  [facility] Phone number:  [phone] Fax number:  [phone]   Type of Clearance Requested:   - Medical    Type of Anesthesia:   PROPOFOL   Additional requests/questions:    Elpidio Anis   12/09/2021, 10:17 AM

## 2021-12-16 DIAGNOSIS — M25472 Effusion, left ankle: Secondary | ICD-10-CM | POA: Diagnosis not present

## 2021-12-16 DIAGNOSIS — M25572 Pain in left ankle and joints of left foot: Secondary | ICD-10-CM | POA: Diagnosis not present

## 2021-12-26 DIAGNOSIS — Z1231 Encounter for screening mammogram for malignant neoplasm of breast: Secondary | ICD-10-CM | POA: Diagnosis not present

## 2022-01-06 DIAGNOSIS — G5781 Other specified mononeuropathies of right lower limb: Secondary | ICD-10-CM | POA: Diagnosis not present

## 2022-01-06 DIAGNOSIS — M25572 Pain in left ankle and joints of left foot: Secondary | ICD-10-CM | POA: Diagnosis not present

## 2022-01-06 DIAGNOSIS — M25472 Effusion, left ankle: Secondary | ICD-10-CM | POA: Diagnosis not present

## 2022-01-07 DIAGNOSIS — H18413 Arcus senilis, bilateral: Secondary | ICD-10-CM | POA: Diagnosis not present

## 2022-01-07 DIAGNOSIS — H2511 Age-related nuclear cataract, right eye: Secondary | ICD-10-CM | POA: Diagnosis not present

## 2022-01-07 DIAGNOSIS — H25043 Posterior subcapsular polar age-related cataract, bilateral: Secondary | ICD-10-CM | POA: Diagnosis not present

## 2022-01-07 DIAGNOSIS — H25013 Cortical age-related cataract, bilateral: Secondary | ICD-10-CM | POA: Diagnosis not present

## 2022-01-07 DIAGNOSIS — H2513 Age-related nuclear cataract, bilateral: Secondary | ICD-10-CM | POA: Diagnosis not present

## 2022-01-09 DIAGNOSIS — H6983 Other specified disorders of Eustachian tube, bilateral: Secondary | ICD-10-CM | POA: Diagnosis not present

## 2022-01-10 ENCOUNTER — Telehealth: Payer: Self-pay | Admitting: Internal Medicine

## 2022-01-10 NOTE — Telephone Encounter (Signed)
Pt c/o medication issue: ? ?1. Name of Medication:  ? ?no drip nasal mist  ?Gatifloxacin 0.5 % drops 2.5 mg ?prednisolone 1% drop susenspion 5 mL ?ketorolac 0.5 % drops ?Allergy relief [facility] version of Claritin ? ?2. How are you currently taking this medication (dosage and times per day)? Need to confirm okay to take ? ?3. Are you having a reaction (difficulty breathing--STAT)? no ? ?4. What is your medication issue? Patient states she is having cataracts surgery next week and has to start eye drops Sunday. She would like to know if they are okay for her to take. She states she is also having congestion and wants to know if there is a recommendation for a decongest medication. She says her ears are stopped up and saw her PCP, but was not given an antibiotic. She would like to know if she needs one. She says she was given allergy relief, [facility] version of Claritin.  ? ?

## 2022-01-10 NOTE — Telephone Encounter (Signed)
After talking with the PharmD..... pt advised she may use the keds listed and to avoid decongestants... to try Flonase and a saline nasal rinse.  ? ?Pt verbalized understanding and will reach out to her PCP Monday 01/13/22.  ?

## 2022-01-15 DIAGNOSIS — H2513 Age-related nuclear cataract, bilateral: Secondary | ICD-10-CM | POA: Diagnosis not present

## 2022-01-15 DIAGNOSIS — H2511 Age-related nuclear cataract, right eye: Secondary | ICD-10-CM | POA: Diagnosis not present

## 2022-01-16 DIAGNOSIS — H2512 Age-related nuclear cataract, left eye: Secondary | ICD-10-CM | POA: Diagnosis not present

## 2022-01-22 ENCOUNTER — Ambulatory Visit: Payer: Medicare PPO | Admitting: Internal Medicine

## 2022-01-23 DIAGNOSIS — H6122 Impacted cerumen, left ear: Secondary | ICD-10-CM | POA: Diagnosis not present

## 2022-01-23 DIAGNOSIS — M25572 Pain in left ankle and joints of left foot: Secondary | ICD-10-CM | POA: Diagnosis not present

## 2022-01-27 ENCOUNTER — Ambulatory Visit: Payer: Medicare PPO | Admitting: Internal Medicine

## 2022-01-29 DIAGNOSIS — M25472 Effusion, left ankle: Secondary | ICD-10-CM | POA: Diagnosis not present

## 2022-01-29 DIAGNOSIS — M25572 Pain in left ankle and joints of left foot: Secondary | ICD-10-CM | POA: Diagnosis not present

## 2022-02-12 ENCOUNTER — Other Ambulatory Visit: Payer: Self-pay | Admitting: Internal Medicine

## 2022-02-18 ENCOUNTER — Ambulatory Visit: Payer: Medicare PPO | Admitting: Internal Medicine

## 2022-02-19 DIAGNOSIS — H2512 Age-related nuclear cataract, left eye: Secondary | ICD-10-CM | POA: Diagnosis not present

## 2022-02-25 DIAGNOSIS — K648 Other hemorrhoids: Secondary | ICD-10-CM | POA: Diagnosis not present

## 2022-02-25 DIAGNOSIS — D123 Benign neoplasm of transverse colon: Secondary | ICD-10-CM | POA: Diagnosis not present

## 2022-02-25 DIAGNOSIS — D122 Benign neoplasm of ascending colon: Secondary | ICD-10-CM | POA: Diagnosis not present

## 2022-02-25 DIAGNOSIS — D120 Benign neoplasm of cecum: Secondary | ICD-10-CM | POA: Diagnosis not present

## 2022-02-25 DIAGNOSIS — D128 Benign neoplasm of rectum: Secondary | ICD-10-CM | POA: Diagnosis not present

## 2022-02-25 DIAGNOSIS — D124 Benign neoplasm of descending colon: Secondary | ICD-10-CM | POA: Diagnosis not present

## 2022-02-25 DIAGNOSIS — Z86010 Personal history of colonic polyps: Secondary | ICD-10-CM | POA: Diagnosis not present

## 2022-02-25 DIAGNOSIS — K5730 Diverticulosis of large intestine without perforation or abscess without bleeding: Secondary | ICD-10-CM | POA: Diagnosis not present

## 2022-02-27 DIAGNOSIS — D122 Benign neoplasm of ascending colon: Secondary | ICD-10-CM | POA: Diagnosis not present

## 2022-03-18 DIAGNOSIS — N819 Female genital prolapse, unspecified: Secondary | ICD-10-CM | POA: Diagnosis not present

## 2022-04-05 NOTE — Progress Notes (Deleted)
Patient ID: Crystal Yoder, female    DOB: 11-02-1945, 76 y.o.   MRN: [id number]   HPI F former smoker followed for OSA/ failed CPAP, allergic rhinitis, COPD/ chronic cough,  complicated by hx of GERD, peripheral venous insufficiency, OHS, Asplenia, CM/CHF, psoriasis NPSG 05/03/00 [place]- AHI 20.9, incomplete control on BIPAP 13/7, body weight 237 lbs PFT 2012-mild obstruction small airways, DLCO 54% Echocardiogram 08/27/2016-EF 40%-45%, , gr 1 diastolic dysfunction, chamber dilatation, PA peak 47 mmHg FENO 08/29/16  6- WNL -----------------------------------------------------------------------------------   01/22/21- 76 year old female former smoker followed for COPD/chronic cough, allergic rhinitis, OSA/failed CPAP, complicated by GERD, peripheral venous insufficiency, obesity, OHS, Asplenia, CM/CHF/ diast dysfunction, psoriasis -Called 3/28 c/o sinus congestion and ear pain. PCP gave ZPAK. [facility] dx'd TMJ. We called in omnicef. Covid vax- Flu vax-had -----24yr f/u. States she started the [facility] yesterday and tell a slight improvement in her breathing.  Timing suggests maybe pollen related, but omnicef was started based on phone complaint, then we got her in sooner than expected. Husband with Alzheimers, fell jogging and broke ribs.> stress for her.  Denies any wheeze or cough.         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 PCP gave ZPAK. [facility] dx'd TMJ. We called in omnicef. Covid vax- Flu vax-had   Review of Systems-see HPI    + = positive Constitutional:   No-   weight loss, night sweats, fevers, chills, + fatigue, lassitude. HEENT:   No-   headaches,  difficulty swallowing, tooth/dental problems, sore throat,                 + sneezing, itching, +ear ache, +nasal congestion, +post nasal drip,  CV:  No-   chest pain, orthopnea, PND, swelling in lower extremities, anasarca, dizziness, palpitations GI:  No-   heartburn, indigestion, abdominal pain, nausea, vomiting, diarrhea,                 change in bowel habits, loss of appetite Resp: + shortness of breath with exertion or at rest.  No-  excess mucus,             No-  coughing up of blood.   Persistent dry cough           change in color of mucus.  No- wheezing.   Skin: No-   rash or lesions. GU: No-   dysuria,  MS:  No-acute  joint pain or swelling. Psych:  No- change in mood or affect. + depression or anxiety.  No memory loss.  Objective:   Physical Exam  General- Alert, Oriented, Affect-appropriate, Distress- none acute    + Obese,  Skin- rash-none, lesions- none, excoriation- none Lymphadenopathy- none Head- atraumatic            Eyes- Gross vision intact, PERRLA, conjunctivae clear secretions            Ears- Hearing ok, + minor cerumen, +TMs look clear            Nose-  No-Septal dev, mucus, polyps, erosion, perforation             Throat- Mallampati III-IV , mucosa clear , drainage- none, tonsils- atrophic.  Neck- flexible ,  trachea midline, no stridor , thyroid nl, carotid no bruit Chest - symmetrical excursion , unlabored           Heart/CV- RRR , no murmur , no gallop  , no rub, nl s1 s2                           - JVD- none , edema+ trace, stasis changes- none, varices- none           Lung-  Wheeze-None, cough-none; no cough with deep breath , dullness-none, rub- none           Chest wall-  Abd-  Br/ Gen/ Rectal- Not done, not indicated Extrem- cyanosis- none, clubbing, none, atrophy- none, strength- nl.  Superficial varices.  Neuro- grossly intact to observation

## 2022-04-07 ENCOUNTER — Ambulatory Visit: Payer: Medicare PPO | Admitting: Internal Medicine

## 2022-04-22 DIAGNOSIS — E785 Hyperlipidemia, unspecified: Secondary | ICD-10-CM | POA: Diagnosis not present

## 2022-04-22 DIAGNOSIS — K219 Gastro-esophageal reflux disease without esophagitis: Secondary | ICD-10-CM | POA: Diagnosis not present

## 2022-04-22 DIAGNOSIS — I700 Atherosclerosis of aorta: Secondary | ICD-10-CM | POA: Diagnosis not present

## 2022-04-22 DIAGNOSIS — E2839 Other primary ovarian failure: Secondary | ICD-10-CM | POA: Diagnosis not present

## 2022-04-22 DIAGNOSIS — E559 Vitamin D deficiency, unspecified: Secondary | ICD-10-CM | POA: Diagnosis not present

## 2022-04-22 DIAGNOSIS — N1831 Chronic kidney disease, stage 3a: Secondary | ICD-10-CM | POA: Diagnosis not present

## 2022-04-22 DIAGNOSIS — I420 Dilated cardiomyopathy: Secondary | ICD-10-CM | POA: Diagnosis not present

## 2022-04-22 DIAGNOSIS — E538 Deficiency of other specified B group vitamins: Secondary | ICD-10-CM | POA: Diagnosis not present

## 2022-04-22 DIAGNOSIS — Z0000 Encounter for general adult medical examination without abnormal findings: Secondary | ICD-10-CM | POA: Diagnosis not present

## 2022-04-22 DIAGNOSIS — M109 Gout, unspecified: Secondary | ICD-10-CM | POA: Diagnosis not present

## 2022-04-22 NOTE — Progress Notes (Signed)
Cardiology Office Note   Date:  04/23/2022   ID:  Crystal Yoder, Crystal Yoder Apr 10, 1946, MRN [id number]  PCP:  Laurann Montana, MD  Cardiologist:   Dietrich Pates, MD   Patient presents for F/U of NICM    History of Present Illness: Crystal Yoder is a 76 y.o. female with a history of HTN, NICM (LVEF 45 to 50%) GERD, HL, reactive airway disease and mild AS   SHe also has Hx of PVCs with Holter monitor in 2019showing PVC burden was 31%   Repeat echo showed LVEF was  30 to 35% with increased filling pressures   LA was dilated There was mild to mod AS.  With Decline in LVEF,  CT coronary angio showed coronary calcium score of 9   Normal coronary arteries  The PA was noted to be severely dilated at 53 mm    CT of lungs showed no PE   In Oct 2019 she underwent R heart cath  To evaluated for pulmonary HTN   Results:  Pressures RA: A-wave 12, V wave 12; mean 10 RV: 59/12 PA: 62/26; mean 37 PW: A-wave 20, V wave 22; mean 18 Repeat PA: 55/25; mean 36  Oxygen saturation in the pulmonary artery was 73% in the AO 96%.  By the Fick method, cardiac output was 7.1 L/min with a cardiac index of 3.1 L/min/m.  PVR: 2.66 WU    I saw the pt in clinic back in Feb 2023  She notes occasional chest discomfort that is worse with deep breath  Fleeting   No CP otherwise.    Denies palpitations   Breathing is OK    Current Meds  Medication Sig   ascorbic acid (VITAMIN C) 100 MG tablet Take 1 tablet by mouth daily.   carvedilol (COREG) 3.125 MG tablet TAKE 1 TABLET(3.125 MG) BY MOUTH TWICE DAILY WITH A MEAL   Cholecalciferol 25 MCG (1000 UT) capsule Take by mouth.   clindamycin (CLEOCIN) 150 MG capsule TAKE 4 CAPSULES BY MOUTH ONE HOUR PRIOR TO DENTAL APPOINTMENT   colchicine 0.6 MG tablet Take 0.6 mg by mouth daily.   furosemide (LASIX) 20 MG tablet Take 20 mg by mouth daily. You may take an extra as needed for increased weight gain, shortness of breath or swelling   Melatonin 5 MG CAPS 1 capsule at bedtime  as needed   pantoprazole (PROTONIX) 40 MG tablet Take 40 mg by mouth daily.    potassium chloride SA (KLOR-CON) 20 MEQ tablet TAKE 1 TABLET BY MOUTH DAILY   predniSONE (DELTASONE) 20 MG tablet Take 20 mg by mouth 2 (two) times daily.   rosuvastatin (CRESTOR) 5 MG tablet Take 1 tablet (5 mg total) by mouth daily.   sacubitril-valsartan (ENTRESTO) 24-26 MG Take 1 tablet by mouth 2 (two) times daily.     Allergies:   Penicillins, Sulfa antibiotics, and Sulfonamide derivatives   Past Medical History:  Diagnosis Date   Arthritis    Arthropathy, unspecified, site unspecified    COPD (chronic obstructive pulmonary disease) (HCC)    Esophageal reflux    Essential hypertension 05/31/2017   Obesity, unspecified    Obstructive sleep apnea (adult) (pediatric)    Other acute sinusitis    Other primary cardiomyopathies    Psoriasis    Shortness of breath    Unspecified venous (peripheral) insufficiency     Past Surgical History:  Procedure Laterality Date   ABDOMINAL HYSTERECTOMY  2011   BLADDER SURGERY  2011  tac=ant/post   CHOLECYSTECTOMY N/A 06/01/2017   Procedure: LAPAROSCOPIC CHOLECYSTECTOMY;  Surgeon: Greer Pickerel, MD;  Location: [facility];  Service: General;  Laterality: N/A;   IRRIGATION AND DEBRIDEMENT OF WOUND WITH SPLIT THICKNESS SKIN GRAFT Right 01/25/2014   Procedure: RIGHT FOOT IRRIGATION AND DEBRIDEMENT WITH ACELL/SPLICK THICKNESS SKINGRAFT WITH VAC;  Surgeon: Theodoro Kos, DO;  Location: [facility];  Service: Plastics;  Laterality: Right;   PELVIC FLOOR REPAIR     REPLACEMENT TOTAL KNEE     rt and lt   RIGHT HEART CATH N/A 07/28/2018   Procedure: RIGHT HEART CATH;  Surgeon: Troy Sine, MD;  Location: [facility];  Service: Cardiovascular;  Laterality: N/A;   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 Her smoking use included cigarettes. She has a 5.00 pack-year smoking  history. She has never used smokeless tobacco. She reports current alcohol use. She reports that she does not use drugs.   Family History:  The patient's family history includes Diabetes in her paternal grandfather; Diverticulitis in her father; Prostate cancer in her father.    ROS:  Please see the history of present illness. All other systems are reviewed and  Negative to the above problem except as noted.    PHYSICAL EXAM: VS:  BP 110/70   Pulse 68   Ht 5\' 6"  (1.676 m)   Wt 249 lb (112.9 kg)   SpO2 93%   BMI 40.19 kg/m   GEN: Morbidly obese 76 yo in no acute distress  HEENT: normal  Neck: JVP is not elevated  Cardiac: RRR; no murmurs,   No  LE edema Respiratory:  clear to auscultation bilaterally GI: soft, nontender.  NO obvious hepatomegaly  Skin: Chronic stasis changes legs  Neuro:  Strength and sensation are intact Psych: euthymic mood, full affect   EKG:  EKG is not done today   Earlier this year   SR with LBBB  Echo   09/30/21  Diffuse hypokinesis abnormal septal motion ? LBBB EF similar to TTE done 12/08/18. Left ventricular ejection fraction, by estimation, is 45 to 50%. The left ventricle has mildly decreased function. The left ventricle has no regional wall motion abnormalities. The left ventricular internal cavity size was mildly dilated. Left ventricular diastolic parameters were normal. 1. 2. Right ventricular systolic function is normal. The right ventricular size is normal. 3. Left atrial size was moderately dilated. The mitral valve is abnormal. Mild mitral valve regurgitation. No evidence of mitral stenosis. 4. The aortic valve is normal in structure. Aortic valve regurgitation is not visualized. No aortic stenosis is present. 5. The inferior vena cava is normal in size with greater than 50% respiratory variability, suggesting right atrial pressure of 3 mmHg. Echo  12/08/18  Lipid Panel    Component Value Date/Time   CHOL 196 03/05/2021 1447   TRIG  111 03/05/2021 1447   TRIG 75 10/23/2006 0812   HDL 73 03/05/2021 1447   CHOLHDL 2.7 03/05/2021 1447   CHOLHDL 3 10/31/2013 1440   VLDL 8.4 10/31/2013 1440   LDLCALC 104 (H) 03/05/2021 1447   LDLDIRECT 121.5 10/31/2013 1440      Wt Readings from Last 3 Encounters:  04/23/22 249 lb (112.9 kg)  11/27/21 257 lb (116.6 kg)  09/06/21 257 lb 9.6 oz (116.8 kg)      ASSESSMENT AND PLAN:  1   HFmrEF Last echo  in  Dec 2022  LVEF 45 to 50%    Volume status overall appears OK  Keep on lasix   2  HTN  BP is controlled    Keep on  current regimen     3  Lipids  LDL 128 and HDL  66    With atherosclerosis on aorta recomm Crestor 5 mg   Follow up lipomed in 8 wks   with liver panel    4    PVCs Pt deneis palpitations  Follw  5  Hx gout Aysmptomatic     6  Back/ R hip  pain   Limiting activity   Refer to Sports Med     F/U in clinci next winter/sping  Signed, Dietrich Pates, MD  04/23/2022 2:43 PM    [facility] [address] [place], [place], [place]  [address] Phone: [phone]; Fax: [phone]

## 2022-04-23 ENCOUNTER — Ambulatory Visit: Payer: Medicare PPO | Admitting: Internal Medicine

## 2022-04-23 ENCOUNTER — Encounter: Payer: Self-pay | Admitting: Internal Medicine

## 2022-04-23 VITALS — BP 110/70 | HR 68 | Ht 66.0 in | Wt 249.0 lb

## 2022-04-23 DIAGNOSIS — I10 Essential (primary) hypertension: Secondary | ICD-10-CM | POA: Diagnosis not present

## 2022-04-23 DIAGNOSIS — E782 Mixed hyperlipidemia: Secondary | ICD-10-CM | POA: Diagnosis not present

## 2022-04-23 DIAGNOSIS — Z79899 Other long term (current) drug therapy: Secondary | ICD-10-CM

## 2022-04-23 MED ORDER — ROSUVASTATIN CALCIUM 5 MG PO TABS: 5.0000 mg | ORAL_TABLET | Freq: Every day | ORAL | 3 refills | Status: DC

## 2022-04-23 NOTE — Patient Instructions (Signed)
Medication Instructions:  Crestor 5 mg daily  *If you need a refill on your cardiac medications before your next appointment, please call your pharmacy*   Lab Work: NMR and hepatic in 8 weeks fasting   If you have labs (blood work) drawn today and your tests are completely normal, you will receive your results only by: MyChart Message (if you have MyChart) OR A paper copy in the mail If you have any lab test that is abnormal or we need to change your treatment, we will call you to review the results.   Testing/Procedures:    Follow-Up: At [facility], you and your health needs are our priority.  As part of our continuing mission to provide you with exceptional heart care, we have created designated Provider Care Teams.  These Care Teams include your primary Cardiologist (physician) and Advanced Practice Providers (APPs -  Physician Assistants and Nurse Practitioners) who all work together to provide you with the care you need, when you need it.  We recommend signing up for the patient portal called "MyChart".  Sign up information is provided on this After Visit Summary.  MyChart is used to connect with patients for Virtual Visits (Telemedicine).  Patients are able to view lab/test results, encounter notes, upcoming appointments, etc.  Non-urgent messages can be sent to your provider as well.   To learn more about what you can do with MyChart, go to [email or web address].    Your next appointment:   8 month(s)  The format for your next appointment:   In Person  Provider:   Dietrich Pates, MD     Other Instructions Referral to [facility] medicine for Hip Pain   Important Information About Sugar

## 2022-05-05 NOTE — Progress Notes (Unsigned)
    Aleen Sells [facility] [address] [place] [address] Phone: [phone]   Assessment and Plan:     There are no diagnoses linked to this encounter.  ***   Pertinent previous records reviewed include ***   Follow Up: ***     Subjective:   I, Desirea Mizrahi, am serving as a Neurosurgeon for Doctor Richardean Sale  Chief Complaint: right hip pain  HPI:   05/06/2022 Patient is a 76 year old female complaining of right hip pain. Patient states  Relevant Historical Information: ***  Additional pertinent review of systems negative.   Current Outpatient Medications:    ascorbic acid (VITAMIN C) 100 MG tablet, Take 1 tablet by mouth daily., Disp: , Rfl:    carvedilol (COREG) 3.125 MG tablet, TAKE 1 TABLET(3.125 MG) BY MOUTH TWICE DAILY WITH A MEAL, Disp: 180 tablet, Rfl: 3   Cholecalciferol 25 MCG (1000 UT) capsule, Take by mouth., Disp: , Rfl:    clindamycin (CLEOCIN) 150 MG capsule, TAKE 4 CAPSULES BY MOUTH ONE HOUR PRIOR TO DENTAL APPOINTMENT, Disp: , Rfl:    colchicine 0.6 MG tablet, Take 0.6 mg by mouth daily., Disp: , Rfl:    furosemide (LASIX) 20 MG tablet, Take 20 mg by mouth daily. You may take an extra as needed for increased weight gain, shortness of breath or swelling, Disp: , Rfl:    Melatonin 5 MG CAPS, 1 capsule at bedtime as needed, Disp: , Rfl:    pantoprazole (PROTONIX) 40 MG tablet, Take 40 mg by mouth daily. , Disp: , Rfl:    potassium chloride SA (KLOR-CON) 20 MEQ tablet, TAKE 1 TABLET BY MOUTH DAILY, Disp: 90 tablet, Rfl: 3   predniSONE (DELTASONE) 20 MG tablet, Take 20 mg by mouth 2 (two) times daily., Disp: , Rfl:    rosuvastatin (CRESTOR) 5 MG tablet, Take 1 tablet (5 mg total) by mouth daily., Disp: 90 tablet, Rfl: 3   sacubitril-valsartan (ENTRESTO) 24-26 MG, Take 1 tablet by mouth 2 (two) times daily., Disp: 180 tablet, Rfl: 2   Objective:     There were no vitals filed for this visit.    There is no  height or weight on file to calculate BMI.    Physical Exam:    ***   Electronically signed by:  Aleen Sells [facility] 11:27 AM 05/05/22

## 2022-05-06 ENCOUNTER — Ambulatory Visit (INDEPENDENT_AMBULATORY_CARE_PROVIDER_SITE_OTHER): Payer: Medicare PPO

## 2022-05-06 ENCOUNTER — Ambulatory Visit: Payer: Medicare PPO | Admitting: Sports Medicine

## 2022-05-06 VITALS — BP 130/86 | HR 65 | Ht 66.0 in | Wt 250.0 lb

## 2022-05-06 DIAGNOSIS — M7061 Trochanteric bursitis, right hip: Secondary | ICD-10-CM

## 2022-05-06 DIAGNOSIS — M25551 Pain in right hip: Secondary | ICD-10-CM

## 2022-05-06 DIAGNOSIS — M1611 Unilateral primary osteoarthritis, right hip: Secondary | ICD-10-CM | POA: Diagnosis not present

## 2022-05-06 MED ORDER — MELOXICAM 7.5 MG PO TABS: 7.5000 mg | ORAL_TABLET | Freq: Every day | ORAL | 0 refills | Status: DC

## 2022-05-06 NOTE — Patient Instructions (Addendum)
Good to see you - Start meloxicam 7.5 mg daily x2 weeks.  If still having pain after 2 weeks, complete 3rd-week of meloxicam. May use remaining meloxicam as needed once daily for pain control.  Do not to use additional NSAIDs while taking meloxicam.  May use Tylenol [phone] mg 2 to 3 times a day for breakthrough pain. Glute and hip HEP  Can start over the counter tumeric  2-3 week follow up

## 2022-05-11 ENCOUNTER — Other Ambulatory Visit: Payer: Self-pay | Admitting: Internal Medicine

## 2022-05-12 DIAGNOSIS — M85852 Other specified disorders of bone density and structure, left thigh: Secondary | ICD-10-CM | POA: Diagnosis not present

## 2022-05-12 DIAGNOSIS — M85851 Other specified disorders of bone density and structure, right thigh: Secondary | ICD-10-CM | POA: Diagnosis not present

## 2022-05-12 DIAGNOSIS — Z780 Asymptomatic menopausal state: Secondary | ICD-10-CM | POA: Diagnosis not present

## 2022-05-22 DIAGNOSIS — I951 Orthostatic hypotension: Secondary | ICD-10-CM | POA: Diagnosis not present

## 2022-05-22 DIAGNOSIS — Z6841 Body Mass Index (BMI) 40.0 and over, adult: Secondary | ICD-10-CM | POA: Diagnosis not present

## 2022-05-22 DIAGNOSIS — I509 Heart failure, unspecified: Secondary | ICD-10-CM | POA: Diagnosis not present

## 2022-05-22 DIAGNOSIS — K219 Gastro-esophageal reflux disease without esophagitis: Secondary | ICD-10-CM | POA: Diagnosis not present

## 2022-05-22 DIAGNOSIS — E261 Secondary hyperaldosteronism: Secondary | ICD-10-CM | POA: Diagnosis not present

## 2022-05-22 DIAGNOSIS — K5900 Constipation, unspecified: Secondary | ICD-10-CM | POA: Diagnosis not present

## 2022-05-22 DIAGNOSIS — M1990 Unspecified osteoarthritis, unspecified site: Secondary | ICD-10-CM | POA: Diagnosis not present

## 2022-05-22 DIAGNOSIS — E785 Hyperlipidemia, unspecified: Secondary | ICD-10-CM | POA: Diagnosis not present

## 2022-06-04 NOTE — Progress Notes (Deleted)
    Crystal Yoder [facility] [address] [place] [address] Phone: [phone]   Assessment and Plan:     There are no diagnoses linked to this encounter.  ***   Pertinent previous records reviewed include ***   Follow Up: ***     Subjective:   I, Crystal Yoder, am serving as a Neurosurgeon for Doctor Richardean Sale   Chief Complaint: right hip pain   HPI:    05/06/2022 Patient is a 76 year old female complaining of right hip pain. 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  Additional pertinent review of systems negative.   Current Outpatient Medications:    ascorbic acid (VITAMIN C) 100 MG tablet, Take 1 tablet by mouth daily., Disp: , Rfl:    carvedilol (COREG) 3.125 MG tablet, TAKE 1 TABLET(3.125 MG) BY MOUTH TWICE DAILY WITH A MEAL, Disp: 180 tablet, Rfl: 3   Cholecalciferol 25 MCG (1000 UT) capsule, Take by mouth., Disp: , Rfl:    clindamycin (CLEOCIN) 150 MG capsule, TAKE 4 CAPSULES BY MOUTH ONE HOUR PRIOR TO DENTAL APPOINTMENT, Disp: , Rfl:    colchicine 0.6 MG tablet, Take 0.6 mg by mouth daily., Disp: , Rfl:    ENTRESTO 24-26 MG, TAKE 1 TABLET BY MOUTH TWICE DAILY, Disp: 180 tablet, Rfl: 2   furosemide (LASIX) 20 MG tablet, Take 20 mg by mouth daily. You may take an extra as needed for increased weight gain, shortness of breath or swelling, Disp: , Rfl:    Melatonin 5 MG CAPS, 1 capsule at bedtime as  needed, Disp: , Rfl:    meloxicam (MOBIC) 7.5 MG tablet, Take 1 tablet (7.5 mg total) by mouth daily., Disp: 30 tablet, Rfl: 0   pantoprazole (PROTONIX) 40 MG tablet, Take 40 mg by mouth daily. , Disp: , Rfl:    potassium chloride SA (KLOR-CON) 20 MEQ tablet, TAKE 1 TABLET BY MOUTH DAILY, Disp: 90 tablet, Rfl: 3   rosuvastatin (CRESTOR) 5 MG tablet, Take 1 tablet (5 mg total) by mouth daily., Disp: 90 tablet, Rfl: 3   Objective:     There were no vitals filed for this visit.    There is no height or weight on file to calculate BMI.    Physical Exam:    ***   Electronically signed by:  Crystal Yoder [facility] 8:47 AM 06/04/22

## 2022-06-05 ENCOUNTER — Ambulatory Visit: Payer: Medicare PPO | Admitting: Sports Medicine

## 2022-06-12 ENCOUNTER — Ambulatory Visit: Payer: Medicare PPO | Admitting: Sports Medicine

## 2022-06-16 ENCOUNTER — Other Ambulatory Visit: Payer: Medicare PPO

## 2022-06-16 DIAGNOSIS — Z79899 Other long term (current) drug therapy: Secondary | ICD-10-CM

## 2022-06-16 DIAGNOSIS — I10 Essential (primary) hypertension: Secondary | ICD-10-CM

## 2022-06-16 DIAGNOSIS — E782 Mixed hyperlipidemia: Secondary | ICD-10-CM | POA: Diagnosis not present

## 2022-06-17 LAB — NMR, LIPOPROFILE
Cholesterol, Total: 156 mg/dL (ref 100–199)
HDL Particle Number: 34.3 umol/L (ref >=30.5)
HDL-C: 72 mg/dL (ref >39)
LDL Particle Number: 761 nmol/L (ref <1000)
LDL Size: 21.2 nm (ref >20.5)
LDL-C (NIH Calc): 70 mg/dL (ref 0–99)
LP-IR Score: <25 (ref <=45)
Small LDL Particle Number: 289 nmol/L (ref <=527)
Triglycerides: 70 mg/dL (ref 0–149)

## 2022-06-17 LAB — HEPATIC FUNCTION PANEL
ALT: 12 IU/L (ref 0–32)
AST: 14 IU/L (ref 0–40)
Albumin: 4.1 g/dL (ref 3.8–4.8)
Alkaline Phosphatase: 68 IU/L (ref 44–121)
Bilirubin Total: 0.4 mg/dL (ref 0.0–1.2)
Bilirubin, Direct: 0.13 mg/dL (ref 0.00–0.40)
Total Protein: 6.8 g/dL (ref 6.0–8.5)

## 2022-06-19 DIAGNOSIS — Z86010 Personal history of colonic polyps: Secondary | ICD-10-CM | POA: Diagnosis not present

## 2022-06-19 DIAGNOSIS — R109 Unspecified abdominal pain: Secondary | ICD-10-CM | POA: Diagnosis not present

## 2022-06-20 ENCOUNTER — Telehealth: Payer: Self-pay | Admitting: *Deleted

## 2022-06-20 NOTE — Telephone Encounter (Signed)
   Pre-operative Risk Assessment    Patient Name: Crystal Yoder  DOB: 01/04/1946 MRN: [id number]      Request for Surgical Clearance    Procedure:   COLONOSCOPY  Date of Surgery:  Clearance 09/12/22                                 Surgeon:  DR. Kerin Salen Surgeon's Group or Practice Name:  [facility] Phone number:  [phone] Fax number:  [phone]   Type of Clearance Requested:   - Medical ; NO MEDICATIONS LISTED AS NEEDING TO BE HELD   Type of Anesthesia:  Not Indicated PROPOFOL?)   Additional requests/questions:    Elpidio Anis   06/20/2022, 9:11 AM

## 2022-06-20 NOTE — Telephone Encounter (Signed)
S/w the pt and she has been set for televisit  pre op appt 08/11/22 @ 10:20. Med rec not done as pt states she was taking a nap. Consent has been given .     Patient Consent for Virtual Visit        EMIAH PELLICANO has provided verbal consent on 06/20/2022 for a virtual visit (video or telephone).   CONSENT FOR VIRTUAL VISIT FOR:  [name]  By participating in this virtual visit I agree to the following:  I hereby voluntarily request, consent and authorize [facility] and its employed or contracted physicians, physician assistants, nurse practitioners or other licensed health care professionals (the Practitioner), to provide me with telemedicine health care services (the "Services") as deemed necessary by the treating Practitioner. I acknowledge and consent to receive the Services by the Practitioner via telemedicine. I understand that the telemedicine visit will involve communicating with the Practitioner through live audiovisual communication technology and the disclosure of certain medical information by electronic transmission. I acknowledge that I have been given the opportunity to request an in-person assessment or other available alternative prior to the telemedicine visit and am voluntarily participating in the telemedicine visit.  I understand that I have the right to withhold or withdraw my consent to the use of telemedicine in the course of my care at any time, without affecting my right to future care or treatment, and that the Practitioner or I may terminate the telemedicine visit at any time. I understand that I have the right to inspect all information obtained and/or recorded in the course of the telemedicine visit and may receive copies of available information for a reasonable fee.  I understand that some of the potential risks of receiving the Services via telemedicine include:  Delay or interruption in medical evaluation due to technological equipment failure or  disruption; Information transmitted may not be sufficient (e.g. poor resolution of images) to allow for appropriate medical decision making by the Practitioner; and/or  In rare instances, security protocols could fail, causing a breach of personal health information.  Furthermore, I acknowledge that it is my responsibility to provide information about my medical history, conditions and care that is complete and accurate to the best of my ability. I acknowledge that Practitioner's advice, recommendations, and/or decision may be based on factors not within their control, such as incomplete or inaccurate data provided by me or distortions of diagnostic images or specimens that may result from electronic transmissions. I understand that the practice of medicine is not an exact science and that Practitioner makes no warranties or guarantees regarding treatment outcomes. I acknowledge that a copy of this consent can be made available to me via my patient portal [facility] MyChart), or I can request a printed copy by calling the office of [facility].    I understand that my insurance will be billed for this visit.   I have read or had this consent read to me. I understand the contents of this consent, which adequately explains the benefits and risks of the Services being provided via telemedicine.  I have been provided ample opportunity to ask questions regarding this consent and the Services and have had my questions answered to my satisfaction. I give my informed consent for the services to be provided through the use of telemedicine in my medical care

## 2022-06-20 NOTE — Telephone Encounter (Signed)
   Name: Crystal Yoder  DOB: 03-14-1946  MRN: [id number]  Primary Cardiologist: Dietrich Pates, MD  Chart reviewed as part of pre-operative protocol coverage. Because of Crystal Yoder past medical history and time since last visit, she will require a follow-up telephone visit in order to better assess preoperative cardiovascular risk.  Pre-op covering staff: - Please schedule appointment and call patient to inform them. If patient already had an upcoming appointment within acceptable timeframe, please add "pre-op clearance" to the appointment notes so provider is aware. - Please contact requesting surgeon's office via preferred method (i.e, phone, fax) to inform them of need for appointment prior to surgery.  No medications are indicated as needing held.  Sharlene Dory, PA-C  06/20/2022, 11:42 AM

## 2022-06-20 NOTE — Telephone Encounter (Signed)
S/w the pt and she has been set for televisit  pre op appt 08/11/22 @ 10:20. Med rec not done as pt states she was taking a nap. Consent has been given .

## 2022-07-10 DIAGNOSIS — N819 Female genital prolapse, unspecified: Secondary | ICD-10-CM | POA: Diagnosis not present

## 2022-07-10 DIAGNOSIS — Z960 Presence of urogenital implants: Secondary | ICD-10-CM | POA: Diagnosis not present

## 2022-07-10 DIAGNOSIS — N952 Postmenopausal atrophic vaginitis: Secondary | ICD-10-CM | POA: Diagnosis not present

## 2022-07-23 ENCOUNTER — Other Ambulatory Visit: Payer: Self-pay

## 2022-07-23 MED ORDER — FUROSEMIDE 20 MG PO TABS: 20.0000 mg | ORAL_TABLET | Freq: Every day | ORAL | 8 refills | Status: DC

## 2022-07-23 NOTE — Telephone Encounter (Signed)
Pt's medication was sent to pt's pharmacy as requested. Confirmation received.  °

## 2022-07-24 DIAGNOSIS — Z23 Encounter for immunization: Secondary | ICD-10-CM | POA: Diagnosis not present

## 2022-08-11 ENCOUNTER — Ambulatory Visit: Payer: Medicare PPO | Attending: Cardiology | Admitting: Nurse Practitioner

## 2022-08-11 DIAGNOSIS — Z01810 Encounter for preprocedural cardiovascular examination: Secondary | ICD-10-CM

## 2022-08-11 NOTE — Progress Notes (Signed)
Virtual Visit via Telephone Note   Because of Crystal Yoder's co-morbid illnesses, she is at least at moderate risk for complications without adequate follow up.  This format is felt to be most appropriate for this patient at this time.  The patient did not have access to video technology/had technical difficulties with video requiring transitioning to audio format only (telephone).  All issues noted in this document were discussed and addressed.  No physical exam could be performed with this format.  Please refer to the patient's chart for her consent to telehealth for [facility].  Evaluation Performed:  Preoperative cardiovascular risk assessment _____________   Date:  08/11/2022   Patient ID:  Crystal Yoder, DOB 1946/05/09, MRN [id number] Patient Location:  Home Provider location:   Office  Primary Care Provider:  Laurann Montana, MD Primary Cardiologist:  Dietrich Pates, MD  Chief Complaint / Patient Profile   76 y.o. y/o female with a h/o NICM (EF 45 to 50%), PVCs, hypertension, hyperlipidemia, mild aortic stenosis, reactive airway disease, and GERD who is pending colonoscopy on 09/12/2022 with Dr. Kerin Salen of [facility] and presents today for telephonic preoperative cardiovascular risk assessment.  Past Medical History    Past Medical History:  Diagnosis Date   Arthritis    Arthropathy, unspecified, site unspecified    COPD (chronic obstructive pulmonary disease) (HCC)    Esophageal reflux    Essential hypertension 05/31/2017   Obesity, unspecified    Obstructive sleep apnea (adult) (pediatric)    Other acute sinusitis    Other primary cardiomyopathies    Psoriasis    Shortness of breath    Unspecified venous (peripheral) insufficiency    Past Surgical History:  Procedure Laterality Date   ABDOMINAL HYSTERECTOMY  2011   BLADDER SURGERY  2011   tac=ant/post   CHOLECYSTECTOMY N/A 06/01/2017   Procedure: LAPAROSCOPIC CHOLECYSTECTOMY;  Surgeon: Gaynelle Adu, MD;   Location: [facility];  Service: General;  Laterality: N/A;   IRRIGATION AND DEBRIDEMENT OF WOUND WITH SPLIT THICKNESS SKIN GRAFT Right 01/25/2014   Procedure: RIGHT FOOT IRRIGATION AND DEBRIDEMENT WITH ACELL/SPLICK THICKNESS SKINGRAFT WITH VAC;  Surgeon: Wayland Denis, DO;  Location: [facility];  Service: Plastics;  Laterality: Right;   PELVIC FLOOR REPAIR     REPLACEMENT TOTAL KNEE     rt and lt   RIGHT HEART CATH N/A 07/28/2018   Procedure: RIGHT HEART CATH;  Surgeon: Lennette Bihari, MD;  Location: [facility];  Service: Cardiovascular;  Laterality: N/A;   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   Sulfa Antibiotics Diarrhea, Nausea And Vomiting and Other (See Comments)    Other Reaction: GI Upset Other Reaction: GI Upset  Other Reaction: GI Upset   Sulfonamide Derivatives Diarrhea and Nausea And Vomiting    History of Present Illness    Crystal Yoder is a 76 y.o. female who presents via audio/video conferencing for a telehealth visit today.  Pt was  last seen in cardiology clinic on 04/23/2022 by Dr.Ross. At that time TAHJA LIAO was doing well. The patient is now pending procedure as outlined above. Since her last visit, she has been stable from a cardiac standpoint.  She does note that she "does not get around as well" as she used to.  She attributes this largely to pain in her feet due to gout. She denies chest pain, palpitations, dyspnea,  pnd, orthopnea, n, v, dizziness, syncope, edema, weight gain, or early satiety. All other systems reviewed and are otherwise negative except as noted above.   Home Medications    Prior to Admission medications   Medication Sig Start Date End Date Taking? Authorizing Provider  ascorbic acid (VITAMIN C) 100 MG tablet Take 1 tablet by mouth daily.    [provider]  carvedilol (COREG) 3.125 MG tablet TAKE 1 TABLET(3.125 MG) BY MOUTH TWICE DAILY WITH A MEAL 02/12/22   Fay Records, MD  Cholecalciferol 25 MCG (1000 UT) capsule Take by mouth.    [provider]  clindamycin (CLEOCIN) 150 MG capsule TAKE 4 CAPSULES BY MOUTH ONE HOUR PRIOR TO DENTAL APPOINTMENT    [provider]  colchicine 0.6 MG tablet Take 0.6 mg by mouth daily.    [provider]  ENTRESTO 24-26 MG TAKE 1 TABLET BY MOUTH TWICE DAILY 05/12/22   Fay Records, MD  furosemide (LASIX) 20 MG tablet Take 1 tablet (20 mg total) by mouth daily. You may take an extra as needed for increased weight gain, shortness of breath or swelling 07/23/22   Fay Records, MD  Melatonin 5 MG CAPS 1 capsule at bedtime as needed    [provider]  meloxicam (MOBIC) 7.5 MG tablet Take 1 tablet (7.5 mg total) by mouth daily. 05/06/22   Glennon Mac, DO  pantoprazole (PROTONIX) 40 MG tablet Take 40 mg by mouth daily.     [provider]  potassium chloride SA (KLOR-CON) 20 MEQ tablet TAKE 1 TABLET BY MOUTH DAILY 04/09/21   Fay Records, MD  rosuvastatin (CRESTOR) 5 MG tablet Take 1 tablet (5 mg total) by mouth daily. 04/23/22   Fay Records, MD    Physical Exam    Vital Signs:  AMANDY CHUBBUCK does not have vital signs available for review today.  Given telephonic nature of communication, physical exam is limited. AAOx3. NAD. Normal affect.  Speech and respirations are unlabored.  Accessory Clinical Findings    None  Assessment & Plan    1.  Preoperative Cardiovascular Risk  Assessment:  According to the Revised Cardiac Risk Index (RCRI), her Perioperative Risk of Major Cardiac Event is (%): 0.9. Her Functional Capacity in METs is: 4.4 according to the Duke Activity Status Index (DASI). Therefore, based on ACC/AHA guidelines, patient would be at acceptable risk for the planned procedure without further cardiovascular testing.  The patient was advised that if she develops new symptoms prior to surgery to contact our office to arrange for a follow-up visit, and she verbalized understanding.   A copy of this note will be routed to requesting surgeon.  Time:   Today, I have spent 10 minutes with the patient with telehealth technology discussing medical history, symptoms, and management plan.     Lenna Sciara, NP  08/11/2022, 10:35 AM

## 2022-08-21 DIAGNOSIS — N1831 Chronic kidney disease, stage 3a: Secondary | ICD-10-CM | POA: Diagnosis not present

## 2022-08-21 DIAGNOSIS — E559 Vitamin D deficiency, unspecified: Secondary | ICD-10-CM | POA: Diagnosis not present

## 2022-08-21 DIAGNOSIS — N2581 Secondary hyperparathyroidism of renal origin: Secondary | ICD-10-CM | POA: Diagnosis not present

## 2022-08-21 DIAGNOSIS — M62838 Other muscle spasm: Secondary | ICD-10-CM | POA: Diagnosis not present

## 2022-09-12 ENCOUNTER — Telehealth: Payer: Self-pay | Admitting: Internal Medicine

## 2022-09-12 DIAGNOSIS — D124 Benign neoplasm of descending colon: Secondary | ICD-10-CM | POA: Diagnosis not present

## 2022-09-12 DIAGNOSIS — D120 Benign neoplasm of cecum: Secondary | ICD-10-CM | POA: Diagnosis not present

## 2022-09-12 DIAGNOSIS — Z9889 Other specified postprocedural states: Secondary | ICD-10-CM | POA: Diagnosis not present

## 2022-09-12 DIAGNOSIS — K5730 Diverticulosis of large intestine without perforation or abscess without bleeding: Secondary | ICD-10-CM | POA: Diagnosis not present

## 2022-09-12 DIAGNOSIS — Z86010 Personal history of colonic polyps: Secondary | ICD-10-CM | POA: Diagnosis not present

## 2022-09-12 DIAGNOSIS — K648 Other hemorrhoids: Secondary | ICD-10-CM | POA: Diagnosis not present

## 2022-09-12 DIAGNOSIS — Z09 Encounter for follow-up examination after completed treatment for conditions other than malignant neoplasm: Secondary | ICD-10-CM | POA: Diagnosis not present

## 2022-09-12 DIAGNOSIS — D125 Benign neoplasm of sigmoid colon: Secondary | ICD-10-CM | POA: Diagnosis not present

## 2022-09-12 NOTE — Telephone Encounter (Signed)
Patient states today during her colonoscopy Dr. Marca Ancona was very concerned because her heart was "acting up." Patient states they didn't even give her a full dose of anesthesia because of how concerned they were.

## 2022-09-12 NOTE — Telephone Encounter (Signed)
Called patient back about message. Patient could not tell me what her EKG showed at the GI doctors office, but she will bring in a copy. Patient stated she was told to follow-up with cardiology for concerning EKG. Made patient an appointment next week with NP.

## 2022-09-16 ENCOUNTER — Encounter: Payer: Self-pay | Admitting: Cardiology

## 2022-09-16 ENCOUNTER — Ambulatory Visit: Payer: Medicare PPO | Attending: Physician Assistant | Admitting: Cardiology

## 2022-09-16 VITALS — BP 122/80 | HR 82 | Ht 68.0 in | Wt 249.4 lb

## 2022-09-16 DIAGNOSIS — R9431 Abnormal electrocardiogram [ECG] [EKG]: Secondary | ICD-10-CM | POA: Diagnosis not present

## 2022-09-16 DIAGNOSIS — I428 Other cardiomyopathies: Secondary | ICD-10-CM | POA: Diagnosis not present

## 2022-09-16 DIAGNOSIS — I10 Essential (primary) hypertension: Secondary | ICD-10-CM

## 2022-09-16 DIAGNOSIS — I493 Ventricular premature depolarization: Secondary | ICD-10-CM | POA: Diagnosis not present

## 2022-09-16 MED ORDER — METOPROLOL TARTRATE 25 MG PO TABS: 25.0000 mg | ORAL_TABLET | Freq: Two times a day (BID) | ORAL | 3 refills | Status: DC

## 2022-09-16 NOTE — Progress Notes (Signed)
Cardiology Office Note:    Date:  09/16/2022   ID:  Crystal Yoder, DOB Nov 26, 1945, MRN [id number]  PCP:  Laurann Montana, MD   [facility] Providers Cardiologist:  Dietrich Pates, MD   Referring MD: Laurann Montana, MD   Chief Complaint  Patient presents with   Abnormal ECG        Fatigue   orthopnea    History of Present Illness:    Crystal Yoder is a 76 y.o. female with a hx of HTN, NICM, GERD, HLD, reactive airway disease, mild AS, PVCs. Patient is followed by Dr. Tenny Craw.   Per chart review, patient wore a holder monitor in 05/2018 that showed frequent PVCs with a 31% burden. Echocardiogram on 06/30/2018 showed EF 30-35% with moderate dilation of the LV, moderate LVH, mild-moderate aortic valve stenosis. 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  Right heart catheterization on 07/28/2018 showed moderate elevation of right heart pressures with moderate pulmonary htn. Patient had a repeat echocardiogram on 12/08/2018 that showed improvement in EF to 45-50%, abnormal septal motion consistent with LBBB. Repeat holter monitor worn in 03/2019 showed 7% PVC burden.   Patient's most recent echocardiogram was completed on 09/30/2021 and showed EF 45-50%, abnormal septal motion consistent with LBBB, no regional wall motion abnormalities, mild mitral valve regurgitation. Patient was last seen by Dr. Tenny Craw on 04/23/2022. She had a colonoscopy on 09/12/2022, during which time the Doctor was very concerned about her heart "acting up". Patient was told to follow up with cardiology for abnormal EKG.   On interview today, patient reports that she has been having several "problems." She lives with her husband who has alzheimer's, and she has moved multiple times over the past few years. She does not feel like she has much social support and believes that her friends have forgotten her. She  reports feeling more tired than usual and feels like her "heart is limiting her." She denies any chest pain or tightness, but feels like her "heart is heavy when she lays down at night." Reports that when she lays flat in bed, she feels like she cannot catch her breath and coughs. She denies dyspnea on exertion, chest pain on exertion. She has been trying to go to the gym more frequently, and does not have chest pain while exercising. Denies palpitations. She has significant issues with gout in her left foot. Reports that multiple doctors in the past have told her that her gout is related to her lasix use. She asked if she could stop lasix a while back, but Dr. Tenny Craw was concerned that she would hold onto too much fluid. Lasix makes her go to the bathroom very frequently, and she often has urinary incontinence.   Past Medical History:  Diagnosis Date   Arthritis    Arthropathy, unspecified, site unspecified    COPD (chronic obstructive pulmonary disease) (HCC)    Esophageal reflux    Essential hypertension 05/31/2017   Obesity, unspecified    Obstructive sleep apnea (adult) (pediatric)    Other acute sinusitis    Other primary cardiomyopathies    Psoriasis    Shortness of breath    Unspecified venous (peripheral) insufficiency     Past Surgical History:  Procedure Laterality Date   ABDOMINAL HYSTERECTOMY  2011   BLADDER SURGERY  2011   tac=ant/post   CHOLECYSTECTOMY N/A 06/01/2017   Procedure: LAPAROSCOPIC CHOLECYSTECTOMY;  Surgeon:  Gaynelle Adu, MD;  Location: [facility];  Service: General;  Laterality: N/A;   IRRIGATION AND DEBRIDEMENT OF WOUND WITH SPLIT THICKNESS SKIN GRAFT Right 01/25/2014   Procedure: RIGHT FOOT IRRIGATION AND DEBRIDEMENT WITH ACELL/SPLICK THICKNESS SKINGRAFT WITH VAC;  Surgeon: Wayland Denis, DO;  Location: [facility];  Service: Plastics;  Laterality: Right;   PELVIC FLOOR REPAIR     REPLACEMENT TOTAL KNEE     rt and lt   RIGHT HEART CATH N/A 07/28/2018    Procedure: RIGHT HEART CATH;  Surgeon: Lennette Bihari, MD;  Location: [facility];  Service: Cardiovascular;  Laterality: N/A;   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   Cholecalciferol 25 MCG (1000 UT) capsule Take 1,000 Units by mouth daily.   ENTRESTO 24-26 MG TAKE 1 TABLET BY MOUTH TWICE DAILY   furosemide (LASIX) 20 MG tablet Take 1 tablet (20 mg total) by mouth daily. You may take an extra as needed for increased weight gain, shortness of breath or swelling   Melatonin 5 MG CAPS Take 5 mg by mouth as needed (sleep).   metoprolol tartrate (LOPRESSOR) 25 MG tablet Take 1 tablet (25 mg total) by mouth 2 (two) times daily.   pantoprazole (PROTONIX) 40 MG tablet Take 40 mg by mouth daily.    potassium chloride SA (KLOR-CON) 20 MEQ tablet TAKE 1 TABLET BY MOUTH DAILY   rosuvastatin (CRESTOR) 5 MG tablet Take 1 tablet (5 mg total) by mouth daily.   [DISCONTINUED] carvedilol (COREG) 3.125 MG tablet TAKE 1 TABLET(3.125 MG) BY MOUTH TWICE DAILY WITH A MEAL     Allergies:   Penicillins, Sulfa antibiotics, and Sulfonamide derivatives   Social History   Socioeconomic History   Marital status: Married    Spouse name: Not on file   Number of children: Not on file   Years of education: Not on file   Highest education level: Not on file  Occupational History   Occupation: Engineer, site  Tobacco Use   Smoking status: Former    Packs/day: 0.50    Years: 10.00    Total pack years: 5.00    Types: Cigarettes    Quit date: 10/28/1979    Years since quitting: 42.9   Smokeless tobacco: Never  Vaping Use   Vaping Use: Never used  Substance and Sexual Activity   Alcohol use: Yes    Comment: rare   Drug use: No   Sexual activity: Not on file  Other Topics Concern   Not on file  Social History Narrative   Not on file   Social Determinants of Health    Financial Resource Strain: Medium Risk (11/08/2021)   Overall Financial Resource Strain (CARDIA)    Difficulty of Paying Living Expenses: Somewhat hard  Food Insecurity: No Food Insecurity (11/08/2021)   Hunger Vital Sign    Worried About Running Out of Food in the Last Year: Never true    Ran Out of Food in the Last Year: Never true  Transportation Needs: No Transportation Needs (11/08/2021)   PRAPARE - Administrator, Civil Service (Medical): No    Lack of Transportation (Non-Medical): No  Physical Activity: Not on file  Stress: Stress Concern Present (11/08/2021)   [employer] of Occupational Health - Occupational Stress Questionnaire    Feeling of Stress : Very much  Social Connections: Not on file     Family History: The patient's family history includes Diabetes in her paternal grandfather; Diverticulitis in her father; Prostate cancer in her father.  ROS:   Please see the history of present illness.     All other systems reviewed and are negative.  EKGs/Labs/Other Studies Reviewed:    The following studies were reviewed today:  CT Coronary 07/19/2018  IMPRESSION: 1. Coronary calcium score of 9. This was 6 percentile for age and sex matched control.   2. Normal coronary origin with right dominance.   3. This study is affected by motion, however there is only mild non-obstructive CAD.   4. Pulmonary artery is severely dilated measuring 53 mm suggestive of pulmonary hypertension.  Echocardiogram 12/08/2018   1. The left ventricle has mildly reduced systolic function, with an  ejection fraction of 45-50%. The cavity size was normal. Left ventricular  diastolic Doppler parameters are consistent with impaired relaxation There  is abnormal septal motion consistent   with left bundle branch block. Left ventricular diffuse hypokinesis.   2. The right ventricle has normal systolic function. The cavity was  normal. There is no increase in right ventricular  wall thickness.   3. Left atrial size was mildly dilated.   4. The mitral valve is normal in structure. There is mild thickening and  mild calcification.   5. The tricuspid valve is normal in structure.   6. The aortic valve is tricuspid There is mild thickening and mild  calcification of the aortic valve.   7. The pulmonic valve was normal in structure.   8. There is mild dilatation of the ascending aorta measuring 38 mm.   9. Pulmonary hypertension is moderate.  10. The inferior vena cava was normal in size with <50% respiratory  variability.  11. Right atrial pressure is estimated at 8 mmHg.   Long Term Monitor 03/2019  1. NSR with sinus bradycardia and sinus tachycardia 2. NSVT and NS atrial tachycardia 3. Frequent PVC's at time with bigeminy and trigeminy 4. No prolonged pauses  Echocardiogram 09/30/2021  1. Diffuse hypokinesis abnormal septal motion ? LBBB EF similar to TTE  done 12/08/18. Left ventricular ejection fraction, by estimation, is 45 to  50%. The left ventricle has mildly decreased function. The left ventricle  has no regional wall motion  abnormalities. The left ventricular internal cavity size was mildly  dilated. Left ventricular diastolic parameters were normal.   2. Right ventricular systolic function is normal. The right ventricular  size is normal.   3. Left atrial size was moderately dilated.   4. The mitral valve is abnormal. Mild mitral valve regurgitation. No  evidence of mitral stenosis.   5. The aortic valve is normal in structure. Aortic valve regurgitation is  not visualized. No aortic stenosis is present.   6. The inferior vena cava is normal in size with greater than 50%  respiratory variability, suggesting right atrial pressure of 3 mmHg.   EKG:  EKG is ordered today.  The ekg is difficult to interpret due to artifact (patient has muscle tremors). Appears to be normal sinus rhythm with frequent PVCs and PACs.   Recent Labs: 11/27/2021: BUN 20;  Creatinine, Ser 0.96; NT-Pro BNP 1,574; Potassium 4.7; Sodium 141 06/16/2022: ALT 12  Recent Lipid Panel    Component Value Date/Time   CHOL 196 03/05/2021 1447   TRIG 111 03/05/2021 1447   TRIG 75 10/23/2006 0812   HDL 73 03/05/2021 1447   CHOLHDL 2.7  03/05/2021 1447   CHOLHDL 3 10/31/2013 1440   VLDL 8.4 10/31/2013 1440   LDLCALC 104 (H) 03/05/2021 1447   LDLDIRECT 121.5 10/31/2013 1440     Risk Assessment/Calculations:                Physical Exam:    VS:  BP 122/80   Pulse 82   Ht 5\' 8"  (1.727 m)   Wt 249 lb 6.4 oz (113.1 kg)   SpO2 93%   BMI 37.92 kg/m     Wt Readings from Last 3 Encounters:  09/16/22 249 lb 6.4 oz (113.1 kg)  05/06/22 250 lb (113.4 kg)  04/23/22 249 lb (112.9 kg)     GEN: Elderly female, sitting on the table. Appears uncomfortable but is in no acute distress  HEENT: Normal NECK: No JVD with head of bed elevated to 30 degrees  CARDIAC: RRR, no murmurs, rubs, gallops. Radial pulses 2+ bilaterally  RESPIRATORY:  Crackles in bilateral lung bases. Normal WOB on room air  ABDOMEN: Soft, non-tender, mildly distended  MUSCULOSKELETAL:  Trace edema in BLE  SKIN: Warm and dry NEUROLOGIC:  Alert and oriented x 3 PSYCHIATRIC:  Normal affect   ASSESSMENT:    1. Nonspecific abnormal electrocardiogram (ECG) (EKG)   2. PVC (premature ventricular contraction)   3. NICM (nonischemic cardiomyopathy) (HCC)   4. Essential hypertension    PLAN:    In order of problems listed above:  Abnormal EKG  Frequent PVCs  - Cardiac monitor in 2019 showed 31% PVC burden. Repeat monitor in 2020 showed 7% PVC burden  - Patient had a colonoscopy on 11/17 and was noted to have abnormal telemetry during the procedure. Rhythm strips reviewed-- appears to be sinus rhythm with frequent PVCs and PACs. However, there are also segments suspicious for atrial fibrillation. EKG in office today is difficult to interpret due to baseline artifact (patient has muscle tremors) -  Stop carvedilol and transition to metoprolol tartrate 25 mg BID. Suspect metoprolol will better suppress PVCs with less effect on BP  - Ordered a 30 day monitor to better assess for atrial fibrillation  - Ordered BMP to assess electrolytes   Chronic HFmrEF Nonischemic Cardiomyopathy  - Echocardiogram from 09/2021 showed EF 45-50%, no regional wall motion abnormalities, normal LV diastolic parameters, normal RV systolic function, moderately dilated LA - Patient complains of orthopnea, ankle edema. She is on lasix 20 mg daily. Patient is struggling with gout which makes it difficult for her to walk, and she also has been having some urinary incontinence while on lasix. I am hesitant to stop lasix given patient's complaints of orthopnea, and I suspect she would hold onto too much fluid without having a diuretic on board.  - Ordered BNP to assess volume status. Ordered uric acid given complaint of gout. Ordered BMP to assess renal function and electrolytes  - Continue entresto 24-26 mg BID  - Transition from carvedilol to metoprolol tartrate 25 mg BID  - Ordered echocardiogram to reassess EF given increased frequency of PVCs,  complaints of orthopnea, fatigue, "heart heaviness"   HTN - BP well controlled today in office  - Continue entresto  - transition from carvedilol to metoprolol as above  - Continue lasix for now    Medication Adjustments/Labs and Tests Ordered: Current medicines are reviewed at length with the patient today.  Concerns regarding medicines are outlined above.  Orders Placed This Encounter  Procedures   Pro b natriuretic peptide (BNP)   Uric acid   Basic  metabolic panel   Cardiac event monitor   EKG 12-Lead   ECHOCARDIOGRAM COMPLETE   Meds ordered this encounter  Medications   metoprolol tartrate (LOPRESSOR) 25 MG tablet    Sig: Take 1 tablet (25 mg total) by mouth 2 (two) times daily.    Dispense:  180 tablet    Refill:  3    Patient Instructions  Medication  Instructions:  Your physician has recommended you make the following change in your medication:   STOP Carvedilol  START Metoprolol 25 taking 1 twice a day   *If you need a refill on your cardiac medications before your next appointment, please call your pharmacy*   Lab Work: TODAY:  BMET, PRO BNP, & URIC cid  If you have labs (blood work) drawn today and your tests are completely normal, you will receive your results only by: MyChart Message (if you have MyChart) OR A paper copy in the mail If you have any lab test that is abnormal or we need to change your treatment, we will call you to review the results.   Testing/Procedures: Your physician has requested that you have an echocardiogram BEFORE 10/08/22. Echocardiography is a painless test that uses sound waves to create images of your heart. It provides your doctor with information about the size and shape of your heart and how well your heart's chambers and valves are working. This procedure takes approximately one hour. There are no restrictions for this procedure. Please do NOT wear cologne, perfume, aftershave, or lotions (deodorant is allowed). Please arrive 15 minutes prior to your appointment time.   [employer] Cardiac Event Monitor Instructions Your physician has requested you wear your cardiac event monitor for 30 days, (1-30). [employer] may call or text to confirm a shipping address. The monitor will be sent to a land address via UPS. [employer] will not ship a monitor to a PO BOX. It typically takes 3-5 days to receive your monitor after it has been enrolled. [employer] will assist with USPS tracking if your package is delayed. The telephone number for [employer] is [phone]. Once you have received your monitor, please review the enclosed instructions. Instruction tutorials can also be viewed under help and settings on the enclosed cell phone. Your monitor has already been registered assigning a specific monitor  serial # to you.  Applying the monitor Remove cell phone from case and turn it on. The cell phone works as IT consultant and needs to be within [employer] of you at all times. The cell phone will need to be charged on a daily basis. We recommend you plug the cell phone into the enclosed charger at your bedside table every night.  Monitor batteries: You will receive two monitor batteries labelled #1 and #2. These are your recorders. Plug battery #2 onto the second connection on the enclosed charger. Keep one battery on the charger at all times. This will keep the monitor battery deactivated. It will also keep it fully charged for when you need to switch your monitor batteries. A small light will be blinking on the battery emblem when it is charging. The light on the battery emblem will remain on when the battery is fully charged.  Open package of a Monitor strip. Insert battery #1 into black hood on strip and gently squeeze monitor battery onto connection as indicated in instruction booklet. Set aside while preparing skin.  Choose location for your strip, vertical or horizontal, as indicated in the instruction booklet. Shave to remove all hair from  location. There cannot be any lotions, oils, powders, or colognes on skin where monitor is to be applied. Wipe skin clean with enclosed Saline wipe. Dry skin completely.  Peel paper labeled #1 off the back of the Monitor strip exposing the adhesive. Place the monitor on the chest in the vertical or horizontal position shown in the instruction booklet. One arrow on the monitor strip must be pointing upward. Carefully remove paper labeled #2, attaching remainder of strip to your skin. Try not to create any folds or wrinkles in the strip as you apply it.  Firmly press and release the circle in the center of the monitor battery. You will hear a small beep. This is turning the monitor battery on. The heart emblem on the monitor battery will light up  every 5 seconds if the monitor battery in turned on and connected to the patient securely. Do not push and hold the circle down as this turns the monitor battery off. The cell phone will locate the monitor battery. A screen will appear on the cell phone checking the connection of your monitor strip. This may read poor connection initially but change to good connection within the next minute. Once your monitor accepts the connection you will hear a series of 3 beeps followed by a climbing crescendo of beeps. A screen will appear on the cell phone showing the two monitor strip placement options. Touch the picture that demonstrates where you applied the monitor strip.  Your monitor strip and battery are waterproof. You are able to shower, bathe, or swim with the monitor on. They just ask you do not submerge deeper than 3 feet underwater. We recommend removing the monitor if you are swimming in a lake, river, or ocean.  Your monitor battery will need to be switched to a fully charged monitor battery approximately once a week. The cell phone will alert you of an action which needs to be made.  On the cell phone, tap for details to reveal connection status, monitor battery status, and cell phone battery status. The green dots indicates your monitor is in good status. A red dot indicates there is something that needs your attention.  To record a symptom, click the circle on the monitor battery. In 30-60 seconds a list of symptoms will appear on the cell phone. Select your symptom and tap save. Your monitor will record a sustained or significant arrhythmia regardless of you clicking the button. Some patients do not feel the heart rhythm irregularities. [employer] will notify us of any serious or critical events.  Refer to instruction booklet for instructions on switching batteries, changing strips, the Do not disturb or Pause features, or any additional questions.  Call [employer] at  [phone], to confirm your monitor is transmitting and record your baseline. They will answer any questions you may have regarding the monitor instructions at that time.  Returning the monitor to [employer] Place all equipment back into blue box. Peel off strip of paper to expose adhesive and close box securely. There is a prepaid UPS shipping label on this box. Drop in a UPS drop box, or at a UPS facility like [employer]. You may also contact [employer] to arrange UPS to pick up monitor package at your home.    Follow-Up: At [facility], you and your health needs are our priority.  As part of our continuing mission to provide you with exceptional heart care, we have created designated Provider Care Teams.  These Care Teams include your primary Cardiologist (  physician) and Advanced Practice Providers (APPs -  Physician Assistants and Nurse Practitioners) who all work together to provide you with the care you need, when you need it.  We recommend signing up for the patient portal called "MyChart".  Sign up information is provided on this After Visit Summary.  MyChart is used to connect with patients for Virtual Visits (Telemedicine).  Patients are able to view lab/test results, encounter notes, upcoming appointments, etc.  Non-urgent messages can be sent to your provider as well.   To learn more about what you can do with MyChart, go to [email or web address].    Your next appointment:   AS SCHEDULED   The format for your next appointment:   In Person  Provider:   Dietrich Pates, MD     Other Instructions   Important Information About Sugar         Signed, Jonita Albee, PA-C  09/16/2022 4:53 PM    [facility]

## 2022-09-16 NOTE — Patient Instructions (Signed)
Medication Instructions:  Your physician has recommended you make the following change in your medication:   STOP Carvedilol  START Metoprolol 25 taking 1 twice a day   *If you need a refill on your cardiac medications before your next appointment, please call your pharmacy*   Lab Work: TODAY:  BMET, PRO BNP, & URIC cid  If you have labs (blood work) drawn today and your tests are completely normal, you will receive your results only by: MyChart Message (if you have MyChart) OR A paper copy in the mail If you have any lab test that is abnormal or we need to change your treatment, we will call you to review the results.   Testing/Procedures: Your physician has requested that you have an echocardiogram BEFORE 10/08/22. Echocardiography is a painless test that uses sound waves to create images of your heart. It provides your doctor with information about the size and shape of your heart and how well your heart's chambers and valves are working. This procedure takes approximately one hour. There are no restrictions for this procedure. Please do NOT wear cologne, perfume, aftershave, or lotions (deodorant is allowed). Please arrive 15 minutes prior to your appointment time.   [employer] Cardiac Event Monitor Instructions Your physician has requested you wear your cardiac event monitor for 30 days, (1-30). [employer] may call or text to confirm a shipping address. The monitor will be sent to a land address via UPS. [employer] will not ship a monitor to a PO BOX. It typically takes 3-5 days to receive your monitor after it has been enrolled. [employer] will assist with USPS tracking if your package is delayed. The telephone number for [employer] is [phone]. Once you have received your monitor, please review the enclosed instructions. Instruction tutorials can also be viewed under help and settings on the enclosed cell phone. Your monitor has already been registered assigning a specific  monitor serial # to you.  Applying the monitor Remove cell phone from case and turn it on. The cell phone works as IT consultant and needs to be within [employer] of you at all times. The cell phone will need to be charged on a daily basis. We recommend you plug the cell phone into the enclosed charger at your bedside table every night.  Monitor batteries: You will receive two monitor batteries labelled #1 and #2. These are your recorders. Plug battery #2 onto the second connection on the enclosed charger. Keep one battery on the charger at all times. This will keep the monitor battery deactivated. It will also keep it fully charged for when you need to switch your monitor batteries. A small light will be blinking on the battery emblem when it is charging. The light on the battery emblem will remain on when the battery is fully charged.  Open package of a Monitor strip. Insert battery #1 into black hood on strip and gently squeeze monitor battery onto connection as indicated in instruction booklet. Set aside while preparing skin.  Choose location for your strip, vertical or horizontal, as indicated in the instruction booklet. Shave to remove all hair from location. There cannot be any lotions, oils, powders, or colognes on skin where monitor is to be applied. Wipe skin clean with enclosed Saline wipe. Dry skin completely.  Peel paper labeled #1 off the back of the Monitor strip exposing the adhesive. Place the monitor on the chest in the vertical or horizontal position shown in the instruction booklet. One arrow on the monitor strip must  be pointing upward. Carefully remove paper labeled #2, attaching remainder of strip to your skin. Try not to create any folds or wrinkles in the strip as you apply it.  Firmly press and release the circle in the center of the monitor battery. You will hear a small beep. This is turning the monitor battery on. The heart emblem on the monitor battery will  light up every 5 seconds if the monitor battery in turned on and connected to the patient securely. Do not push and hold the circle down as this turns the monitor battery off. The cell phone will locate the monitor battery. A screen will appear on the cell phone checking the connection of your monitor strip. This may read poor connection initially but change to good connection within the next minute. Once your monitor accepts the connection you will hear a series of 3 beeps followed by a climbing crescendo of beeps. A screen will appear on the cell phone showing the two monitor strip placement options. Touch the picture that demonstrates where you applied the monitor strip.  Your monitor strip and battery are waterproof. You are able to shower, bathe, or swim with the monitor on. They just ask you do not submerge deeper than 3 feet underwater. We recommend removing the monitor if you are swimming in a lake, river, or ocean.  Your monitor battery will need to be switched to a fully charged monitor battery approximately once a week. The cell phone will alert you of an action which needs to be made.  On the cell phone, tap for details to reveal connection status, monitor battery status, and cell phone battery status. The green dots indicates your monitor is in good status. A red dot indicates there is something that needs your attention.  To record a symptom, click the circle on the monitor battery. In 30-60 seconds a list of symptoms will appear on the cell phone. Select your symptom and tap save. Your monitor will record a sustained or significant arrhythmia regardless of you clicking the button. Some patients do not feel the heart rhythm irregularities. [employer] will notify us of any serious or critical events.  Refer to instruction booklet for instructions on switching batteries, changing strips, the Do not disturb or Pause features, or any additional questions.  Call [employer] at  [phone], to confirm your monitor is transmitting and record your baseline. They will answer any questions you may have regarding the monitor instructions at that time.  Returning the monitor to [employer] Place all equipment back into blue box. Peel off strip of paper to expose adhesive and close box securely. There is a prepaid UPS shipping label on this box. Drop in a UPS drop box, or at a UPS facility like [employer]. You may also contact [employer] to arrange UPS to pick up monitor package at your home.    Follow-Up: At [facility], you and your health needs are our priority.  As part of our continuing mission to provide you with exceptional heart care, we have created designated Provider Care Teams.  These Care Teams include your primary Cardiologist (physician) and Advanced Practice Providers (APPs -  Physician Assistants and Nurse Practitioners) who all work together to provide you with the care you need, when you need it.  We recommend signing up for the patient portal called "MyChart".  Sign up information is provided on this After Visit Summary.  MyChart is used to connect with patients for Virtual Visits (Telemedicine).  Patients are able  to view lab/test results, encounter notes, upcoming appointments, etc.  Non-urgent messages can be sent to your provider as well.   To learn more about what you can do with MyChart, go to [email or web address].    Your next appointment:   AS SCHEDULED   The format for your next appointment:   In Person  Provider:   Dietrich Pates, MD     Other Instructions   Important Information About Sugar

## 2022-09-17 ENCOUNTER — Other Ambulatory Visit: Payer: Self-pay | Admitting: Cardiology

## 2022-09-17 ENCOUNTER — Encounter: Payer: Self-pay | Admitting: *Deleted

## 2022-09-17 ENCOUNTER — Other Ambulatory Visit: Payer: Medicare PPO

## 2022-09-17 ENCOUNTER — Telehealth: Payer: Self-pay | Admitting: *Deleted

## 2022-09-17 DIAGNOSIS — Z79899 Other long term (current) drug therapy: Secondary | ICD-10-CM

## 2022-09-17 DIAGNOSIS — I10 Essential (primary) hypertension: Secondary | ICD-10-CM

## 2022-09-17 DIAGNOSIS — I428 Other cardiomyopathies: Secondary | ICD-10-CM

## 2022-09-17 DIAGNOSIS — D120 Benign neoplasm of cecum: Secondary | ICD-10-CM | POA: Diagnosis not present

## 2022-09-17 DIAGNOSIS — I493 Ventricular premature depolarization: Secondary | ICD-10-CM

## 2022-09-17 DIAGNOSIS — R9431 Abnormal electrocardiogram [ECG] [EKG]: Secondary | ICD-10-CM

## 2022-09-17 LAB — BASIC METABOLIC PANEL
BUN/Creatinine Ratio: 23 (ref 12–28)
BUN: 21 mg/dL (ref 8–27)
CO2: 24 mmol/L (ref 20–29)
Calcium: 9.5 mg/dL (ref 8.7–10.3)
Chloride: 107 mmol/L — ABNORMAL HIGH (ref 96–106)
Creatinine, Ser: 0.92 mg/dL (ref 0.57–1.00)
Glucose: 83 mg/dL (ref 70–99)
Potassium: 4.4 mmol/L (ref 3.5–5.2)
Sodium: 145 mmol/L — ABNORMAL HIGH (ref 134–144)
eGFR: 64 mL/min/{1.73_m2} (ref >59)

## 2022-09-17 LAB — URIC ACID: Uric Acid: 7.4 mg/dL (ref 3.1–7.9)

## 2022-09-17 LAB — PRO B NATRIURETIC PEPTIDE: NT-Pro BNP: 2075 pg/mL — ABNORMAL HIGH (ref 0–738)

## 2022-09-17 MED ORDER — POTASSIUM CHLORIDE CRYS ER 20 MEQ PO TBCR: EXTENDED_RELEASE_TABLET | ORAL | 3 refills | Status: DC

## 2022-09-17 MED ORDER — FUROSEMIDE 20 MG PO TABS: ORAL_TABLET | ORAL | 3 refills | Status: DC

## 2022-09-17 NOTE — Telephone Encounter (Signed)
-----   Message from Jonita Albee, [place] sent at 09/17/2022  8:23 AM EST ----- Please tell patient that her BNP (lab that indicates how much extra fluid is in your body) came back elevated. This is likely why she is having some shortness of breath while laying down. Recommend that she should increase her lasix to 40 mg daily for the next 5 days. She should also take 40 mEq of potassium daily for the next 5 days (2 tablets). After 5 days, she can go back to her normal dose  Her kidney function and electrolytes are normal. She will need a repeat BMP in 7 days.

## 2022-09-17 NOTE — Progress Notes (Signed)
Patient enrolled for [employer] to ship a 30 day cardiac event monitor to her address on file.  Dr. Tenny Craw to read.

## 2022-09-22 ENCOUNTER — Ambulatory Visit (HOSPITAL_COMMUNITY)
Admission: RE | Admit: 2022-09-22 | Discharge: 2022-09-22 | Disposition: A | Discharge status: Home / Self Care | Payer: Medicare PPO | Source: Ambulatory Visit | Attending: Cardiology | Admitting: Cardiology

## 2022-09-22 ENCOUNTER — Ambulatory Visit: Payer: Medicare PPO

## 2022-09-22 DIAGNOSIS — Z79899 Other long term (current) drug therapy: Secondary | ICD-10-CM

## 2022-09-22 DIAGNOSIS — R9431 Abnormal electrocardiogram [ECG] [EKG]: Secondary | ICD-10-CM

## 2022-09-22 NOTE — Progress Notes (Signed)
  Echocardiogram 2D Echocardiogram has been performed.  Milda Smart 09/22/2022, 2:16 PM

## 2022-09-23 ENCOUNTER — Telehealth: Payer: Self-pay

## 2022-09-23 DIAGNOSIS — E875 Hyperkalemia: Secondary | ICD-10-CM

## 2022-09-23 LAB — BASIC METABOLIC PANEL
BUN/Creatinine Ratio: 19 (ref 12–28)
BUN: 24 mg/dL (ref 8–27)
CO2: 23 mmol/L (ref 20–29)
Calcium: 9.5 mg/dL (ref 8.7–10.3)
Chloride: 106 mmol/L (ref 96–106)
Creatinine, Ser: 1.29 mg/dL — ABNORMAL HIGH (ref 0.57–1.00)
Glucose: 77 mg/dL (ref 70–99)
Potassium: 5.6 mmol/L — ABNORMAL HIGH (ref 3.5–5.2)
Sodium: 147 mmol/L — ABNORMAL HIGH (ref 134–144)
eGFR: 43 mL/min/{1.73_m2} — ABNORMAL LOW (ref >59)

## 2022-09-23 LAB — ECHOCARDIOGRAM COMPLETE
AR max vel: 1.36 cm2
AV Area VTI: 1.37 cm2
AV Area mean vel: 1.42 cm2
AV Mean grad: 13 mmHg
AV Peak grad: 24.8 mmHg
Ao pk vel: 2.49 m/s
Area-P 1/2: 2.74 cm2
Calc EF: 41.2 %
MV M vel: 3.66 m/s
MV Peak grad: 53.6 mmHg
S' Lateral: 4.8 cm
Single Plane A2C EF: 29.5 %
Single Plane A4C EF: 49.9 %

## 2022-09-23 LAB — PRO B NATRIURETIC PEPTIDE: NT-Pro BNP: 449 pg/mL (ref 0–738)

## 2022-09-23 MED ORDER — FUROSEMIDE 20 MG PO TABS: 20.0000 mg | ORAL_TABLET | Freq: Every day | ORAL | 3 refills | Status: DC

## 2022-09-23 NOTE — Telephone Encounter (Signed)
-----   Message from Jonita Albee, [place] sent at 09/23/2022  8:04 AM EST ----- Please inform patient that her labs showed that her volume status is significantly improved and her BNP (lab that checks for extra fluid) is now within normal limits. Potassium is slightly elevated, so she should not take any potassium supplementation today or tomorrow. Her creatinine (lab that checks kidney function) is also slightly elevated, so she should reduce her dose of lasix back down to 20 mg (1 tablet) daily.   On Thursday 11/30, she needs to have a BMP drawn to check for improvement in potassium and kidney function.

## 2022-09-23 NOTE — Telephone Encounter (Signed)
The patient has been notified of the result and verbalized understanding.  All questions (if any) were answered. Ethelda Chick, RN 09/23/2022 4:28 PM   Placed order for BMET and scheduled patient to come in on Thursday. Patient will hold her potassium and only take lasix 20 mg daily.

## 2022-09-25 ENCOUNTER — Ambulatory Visit: Payer: Medicare PPO | Attending: Internal Medicine

## 2022-09-25 ENCOUNTER — Ambulatory Visit (INDEPENDENT_AMBULATORY_CARE_PROVIDER_SITE_OTHER): Payer: Medicare PPO

## 2022-09-25 DIAGNOSIS — E875 Hyperkalemia: Secondary | ICD-10-CM

## 2022-09-25 DIAGNOSIS — I428 Other cardiomyopathies: Secondary | ICD-10-CM

## 2022-09-25 DIAGNOSIS — R9431 Abnormal electrocardiogram [ECG] [EKG]: Secondary | ICD-10-CM | POA: Diagnosis not present

## 2022-09-25 DIAGNOSIS — I493 Ventricular premature depolarization: Secondary | ICD-10-CM | POA: Diagnosis not present

## 2022-09-25 LAB — BASIC METABOLIC PANEL
BUN/Creatinine Ratio: 21 (ref 12–28)
BUN: 25 mg/dL (ref 8–27)
CO2: 27 mmol/L (ref 20–29)
Calcium: 9.5 mg/dL (ref 8.7–10.3)
Chloride: 104 mmol/L (ref 96–106)
Creatinine, Ser: 1.2 mg/dL — ABNORMAL HIGH (ref 0.57–1.00)
Glucose: 55 mg/dL — ABNORMAL LOW (ref 70–99)
Potassium: 4.7 mmol/L (ref 3.5–5.2)
Sodium: 144 mmol/L (ref 134–144)
eGFR: 47 mL/min/{1.73_m2} — ABNORMAL LOW (ref >59)

## 2022-09-25 NOTE — Progress Notes (Unsigned)
[id number] mailed to patient 09/17/22 and applied in office on 09/25/22.

## 2022-09-27 ENCOUNTER — Telehealth: Payer: Self-pay | Admitting: Home Health

## 2022-09-27 NOTE — Telephone Encounter (Signed)
[employer] called after hour line reporting  6 sec of VT (14 beats), rate of 150 bpm, underlying is Sinus bradycardia 53 bpm, one additional PVCs, occurred at 827am central time, patient was not reached out.   Called the patient, she was doing well today at the time of event recording, she was coughing, but no chest pain, SOB, dizziness, syncope, etc. She is her husband has dementia, is asking if stress has a component affecting heart health.  She is taking scheduled metoprolol accordingly.   Advised patient to continue taking beta-blocker.  If developing concerning symptoms as mentioned above, go to the nearest ER.  Given NSVT on proper therapy, no further workup is required at this time.  BMP from 09/25/2022 showed potassium 4.7.  Consider check magnesium level at next follow-up visit on 10/08/2022 with Dr. Tenny Craw.  She is asking about her echocardiogram result, this can be given at the next follow-up visit as well. All questions answered to satisfaction.

## 2022-10-08 ENCOUNTER — Encounter: Payer: Self-pay | Admitting: Internal Medicine

## 2022-10-08 ENCOUNTER — Ambulatory Visit: Payer: Medicare PPO | Attending: Internal Medicine | Admitting: Internal Medicine

## 2022-10-08 VITALS — Ht 68.0 in | Wt 243.0 lb

## 2022-10-08 DIAGNOSIS — I10 Essential (primary) hypertension: Secondary | ICD-10-CM | POA: Diagnosis not present

## 2022-10-08 DIAGNOSIS — I493 Ventricular premature depolarization: Secondary | ICD-10-CM | POA: Diagnosis not present

## 2022-10-08 DIAGNOSIS — I428 Other cardiomyopathies: Secondary | ICD-10-CM | POA: Diagnosis not present

## 2022-10-08 DIAGNOSIS — Z79899 Other long term (current) drug therapy: Secondary | ICD-10-CM | POA: Diagnosis not present

## 2022-10-08 NOTE — Patient Instructions (Signed)
Medication Instructions:   *If you need a refill on your cardiac medications before your next appointment, please call your pharmacy*   Lab Work: CBC, BMET, PRO BNP TODAY If you have labs (blood work) drawn today and your tests are completely normal, you will receive your results only by: MyChart Message (if you have MyChart) OR A paper copy in the mail If you have any lab test that is abnormal or we need to change your treatment, we will call you to review the results.   Testing/Procedures:    Follow-Up: At [facility], you and your health needs are our priority.  As part of our continuing mission to provide you with exceptional heart care, we have created designated Provider Care Teams.  These Care Teams include your primary Cardiologist (physician) and Advanced Practice Providers (APPs -  Physician Assistants and Nurse Practitioners) who all work together to provide you with the care you need, when you need it.  We recommend signing up for the patient portal called "MyChart".  Sign up information is provided on this After Visit Summary.  MyChart is used to connect with patients for Virtual Visits (Telemedicine).  Patients are able to view lab/test results, encounter notes, upcoming appointments, etc.  Non-urgent messages can be sent to your provider as well.   To learn more about what you can do with MyChart, go to [email or web address].    Your next appointment:   6 month(s)  The format for your next appointment:   In Person  Provider:   Dietrich Pates, MD     Other Instructions   Important Information About Sugar

## 2022-10-08 NOTE — Progress Notes (Addendum)
Cardiology Office Note:    Date:  10/08/2022  ID:  Crystal, Yoder [birth year], MRN [id number]  PCP:  Laurann Montana, MD   [facility] Providers Cardiologist:  Dietrich Pates, MD   Referring MD: Laurann Montana, MD    History of Present Illness:    Crystal Yoder is a 76 y.o. female with a hx of HTN, NICM, GERD, HLD, reactive airway disease, mild AS, PVCs. Patient is followed by Dr. Tenny Craw.  HOlteer monitor in 05/2018 that showed frequent PVCs with a 31% burden. Echocardiogram on 06/30/2018 showed EF 30-35% with moderate dilation of the LV, moderate LVH, mild-moderate aortic valve stenosis. 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  Right heart catheterization on 07/28/2018 showed moderate elevation of right heart pressures with moderate pulmonary htn. Patient had a repeat echocardiogram on 12/08/2018 that showed improvement in EF to 45-50%, abnormal septal motion consistent with LBBB. Repeat holter monitor worn in 03/2019 showed 7% PVC burden.   The pt was last in cardiology clinc in Nov 2023 when she saw Adin Hector.   At that time she complained of stress due to home issues, complained of more fatigue,  She noted some SOB with laying flat Echo done in NOv 2023 showed LVEF 40 to 45%  DIfficult imaging     Also has a monitor in place  Since seen she continues to complain of fatigue   AGain, stresses remain.    She has issues with gout but is concerned about backing down on lasix   She deneis palpitations   No CP    Past Medical History:  Diagnosis Date   Arthritis    Arthropathy, unspecified, site unspecified    COPD (chronic obstructive pulmonary disease) (HCC)    Esophageal reflux    Essential hypertension 05/31/2017   Obesity, unspecified    Obstructive sleep apnea (adult) (pediatric)    Other acute sinusitis    Other primary cardiomyopathies    Psoriasis     Shortness of breath    Unspecified venous (peripheral) insufficiency     Past Surgical History:  Procedure Laterality Date   ABDOMINAL HYSTERECTOMY  2011   BLADDER SURGERY  2011   tac=ant/post   CHOLECYSTECTOMY N/A 06/01/2017   Procedure: LAPAROSCOPIC CHOLECYSTECTOMY;  Surgeon: Gaynelle Adu, MD;  Location: [facility];  Service: General;  Laterality: N/A;   IRRIGATION AND DEBRIDEMENT OF WOUND WITH SPLIT THICKNESS SKIN GRAFT Right 01/25/2014   Procedure: RIGHT FOOT IRRIGATION AND DEBRIDEMENT WITH ACELL/SPLICK THICKNESS SKINGRAFT WITH VAC;  Surgeon: Wayland Denis, DO;  Location: [facility];  Service: Plastics;  Laterality: Right;   PELVIC FLOOR REPAIR     REPLACEMENT TOTAL KNEE     rt and lt   RIGHT HEART CATH N/A 07/28/2018   Procedure: RIGHT HEART CATH;  Surgeon: Lennette Bihari, MD;  Location: [facility];  Service: Cardiovascular;  Laterality: N/A;   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   Cholecalciferol 25 MCG (1000 UT) capsule Take 1,000 Units by mouth daily.   ENTRESTO 24-26 MG TAKE 1 TABLET BY MOUTH TWICE DAILY   Melatonin 5 MG CAPS Take 5 mg by mouth as needed (sleep).   metoprolol tartrate (LOPRESSOR) 25 MG tablet Take 1 tablet (25 mg  total) by mouth 2 (two) times daily.   pantoprazole (PROTONIX) 40 MG tablet Take 40 mg by mouth daily.    potassium chloride SA (KLOR-CON M) 20 MEQ tablet TAKE 2 TABLETS BY MOUTH DAILY X'S 5 DAYS THEN GO BACK TO 1 TABLET BY MOUTH DAILY   rosuvastatin (CRESTOR) 5 MG tablet Take 1 tablet (5 mg total) by mouth daily.   [DISCONTINUED] furosemide (LASIX) 20 MG tablet Take 1 tablet (20 mg total) by mouth daily.     Allergies:   Penicillins, Sulfa antibiotics, and Sulfonamide derivatives   Social History   Socioeconomic History   Marital status: Married    Spouse name: Not on file   Number of children: Not  on file   Years of education: Not on file   Highest education level: Not on file  Occupational History   Occupation: Engineer, site  Tobacco Use   Smoking status: Former    Packs/day: 0.50    Years: 10.00    Total pack years: 5.00    Types: Cigarettes    Quit date: 10/28/1979    Years since quitting: 43.0   Smokeless tobacco: Never  Vaping Use   Vaping Use: Never used  Substance and Sexual Activity   Alcohol use: Yes    Comment: rare   Drug use: No   Sexual activity: Not on file  Other Topics Concern   Not on file  Social History Narrative   Not on file   Social Determinants of Health   Financial Resource Strain: Medium Risk (11/08/2021)   Overall Financial Resource Strain (CARDIA)    Difficulty of Paying Living Expenses: Somewhat hard  Food Insecurity: No Food Insecurity (11/08/2021)   Hunger Vital Sign    Worried About Running Out of Food in the Last Year: Never true    Ran Out of Food in the Last Year: Never true  Transportation Needs: No Transportation Needs (11/08/2021)   PRAPARE - Administrator, Civil Service (Medical): No    Lack of Transportation (Non-Medical): No  Physical Activity: Not on file  Stress: Stress Concern Present (11/08/2021)   [employer] of Occupational Health - Occupational Stress Questionnaire    Feeling of Stress : Very much  Social Connections: Not on file     Family History: The patient's family history includes Diabetes in her paternal grandfather; Diverticulitis in her father; Prostate cancer in her father.  ROS:   Please see the history of present illness.     All other systems reviewed and are negative.  EKGs/Labs/Other Studies Reviewed:    The following studies were reviewed today:  Echo  Nov 2023  Left ventricular ejection fraction, by estimation, is 40 to 45%. The left ventricle has mildly decreased function. The left ventricle demonstrates global hypokinesis. The left ventricular internal cavity size was  severely dilated. Left ventricular diastolic parameters are consistent with Grade II diastolic dysfunction (pseudonormalization). Elevated left atrial pressure. 1. Right ventricular systolic function is normal. The right ventricular size is normal. Tricuspid regurgitation signal is inadequate for assessing PA pressure. 2. The mitral valve is normal in structure. Trivial mitral valve regurgitation. No evidence of mitral stenosis. 3. The aortic valve was not well visualized. Aortic valve regurgitation is trivial. Mild aortic valve stenosis 4. 5. Aortic dilatation noted. There is dilatation of the ascending aorta, measuring 40 mm. The inferior vena cava is dilated in size with >50% respiratory variability, suggesting right atrial pressure of 8 mmHg.  CT Coronary 07/19/2018  IMPRESSION: 1.  Coronary calcium score of 9. This was 59 percentile for age and sex matched control.   2. Normal coronary origin with right dominance.   3. This study is affected by motion, however there is only mild non-obstructive CAD.   4. Pulmonary artery is severely dilated measuring 53 mm suggestive of pulmonary hypertension.  Long Term Monitor 03/2019  1. NSR with sinus bradycardia and sinus tachycardia 2. NSVT and NS atrial tachycardia 3. Frequent PVC's at time with bigeminy and trigeminy 4. No prolonged pauses   EKG:  EKG is not ordered today.    Recent Labs: 06/16/2022: ALT 12 10/08/2022: BUN 21; Creatinine, Ser 1.16; Hemoglobin 13.1; NT-Pro BNP 1,064; Platelets 175; Potassium 4.8; Sodium 143  Recent Lipid Panel    Component Value Date/Time   CHOL 196 03/05/2021 1447   TRIG 111 03/05/2021 1447   TRIG 75 10/23/2006 0812   HDL 73 03/05/2021 1447   CHOLHDL 2.7 03/05/2021 1447   CHOLHDL 3 10/31/2013 1440   VLDL 8.4 10/31/2013 1440   LDLCALC 104 (H) 03/05/2021 1447   LDLDIRECT 121.5 10/31/2013 1440           Physical Exam:    VS:  Ht 5\' 8"  (1.727 m)   Wt 243 lb (110.2 kg)   BMI 36.95  kg/m     BP 110/    P 59 bpm  Wt Readings from Last 3 Encounters:  10/08/22 243 lb (110.2 kg)  09/16/22 249 lb 6.4 oz (113.1 kg)  05/06/22 250 lb (113.4 kg)     GEN:  Obese 76 yo in NAD   Examined in chair HEENT: Normal NECK: No JVD  CARDIAC: RRR, no murmurs,  Tr LE edema  RESPIRATORY:  CTA   ABDOMEN: Soft, non-tender, MUSCULOSKELETAL: No gross deformities  SKIN: Warm and dry NEUROLOGIC:  Alert and oriented x 3 PSYCHIATRIC:  Normal affect    EKG    Sinus bradycardia   59 bpm   Frequent PVCs.   RBBB.   LAFB     ASSESSMENT:    1. Medication management   2. PVC (premature ventricular contraction)   3. NICM (nonischemic cardiomyopathy) (HCC)   4. Essential hypertension     PLAN:    In order of problems listed above:  HFmrEF   Echo in Nov showed LVEF 40 to 45% (compared to reported 45 to 50%)     ON exam, volume status does not appear signficantly elevated.     Will get labs today     Continue current labs for now    Rhythm   Pt with hx frequent PVCs in past   (31/5 burden in 2019)  During recnet colonoscopy had frequent PACs, PVCs.   ? Afib    On metoprolol now   30 day monitor in place       Medication Adjustments/Labs and Tests Ordered: Current medicines are reviewed at length with the patient today.  Concerns regarding medicines are outlined above.  Orders Placed This Encounter  Procedures   CBC   Basic Metabolic Panel (BMET)   Pro b natriuretic peptide (BNP)   No orders of the defined types were placed in this encounter.      Signed, 2020, MD  [facility]

## 2022-10-09 LAB — BASIC METABOLIC PANEL
BUN/Creatinine Ratio: 18 (ref 12–28)
BUN: 21 mg/dL (ref 8–27)
CO2: 23 mmol/L (ref 20–29)
Calcium: 9.4 mg/dL (ref 8.7–10.3)
Chloride: 104 mmol/L (ref 96–106)
Creatinine, Ser: 1.16 mg/dL — ABNORMAL HIGH (ref 0.57–1.00)
Glucose: 96 mg/dL (ref 70–99)
Potassium: 4.8 mmol/L (ref 3.5–5.2)
Sodium: 143 mmol/L (ref 134–144)
eGFR: 49 mL/min/{1.73_m2} — ABNORMAL LOW (ref >59)

## 2022-10-09 LAB — CBC
Hematocrit: 41.2 % (ref 34.0–46.6)
Hemoglobin: 13.1 g/dL (ref 11.1–15.9)
MCH: 29.6 pg (ref 26.6–33.0)
MCHC: 31.8 g/dL (ref 31.5–35.7)
MCV: 93 fL (ref 79–97)
Platelets: 175 10*3/uL (ref 150–450)
RBC: 4.42 x10E6/uL (ref 3.77–5.28)
RDW: 12.6 % (ref 11.7–15.4)
WBC: 6.7 10*3/uL (ref 3.4–10.8)

## 2022-10-09 LAB — PRO B NATRIURETIC PEPTIDE: NT-Pro BNP: 1064 pg/mL — ABNORMAL HIGH (ref 0–738)

## 2022-10-13 ENCOUNTER — Telehealth: Payer: Self-pay

## 2022-10-13 DIAGNOSIS — I428 Other cardiomyopathies: Secondary | ICD-10-CM

## 2022-10-13 DIAGNOSIS — Z79899 Other long term (current) drug therapy: Secondary | ICD-10-CM

## 2022-10-13 DIAGNOSIS — R600 Localized edema: Secondary | ICD-10-CM

## 2022-10-13 MED ORDER — FUROSEMIDE 20 MG PO TABS: ORAL_TABLET | ORAL | 3 refills | Status: DC

## 2022-10-13 NOTE — Telephone Encounter (Signed)
Pt advised and and she will take the lasix 40 mg when she can based on the parameters from Dr Tenny Craw.. she is worried about not controlling her bladder and wanting to send time with her family during the holidays.  She will have labs per Dr Tenny Craw on 10/31/2022.  She will also watch her NA intake especially during this time when she is having meals with her family.

## 2022-10-13 NOTE — Telephone Encounter (Signed)
-----   Message from Paula Ross V, MD sent at 10/09/2022 11:28 PM EST ----- CBC is normal Electrolytes are normal    Cr is 1.16 Fluid is up some   I would increase Lasix 20 mg alternating with 40 mg every other daythen 40 mg 2x per week with 20 on other days Repeat BMET and BNP in 3 wks  

## 2022-10-13 NOTE — Telephone Encounter (Signed)
Pt verbalized understanding of her instructions.

## 2022-10-13 NOTE — Telephone Encounter (Signed)
-----   Message from Pricilla Riffle, MD sent at 10/09/2022 11:28 PM EST ----- CBC is normal Electrolytes are normal    Cr is 1.16 Fluid is up some   I would increase Lasix 20 mg alternating with 40 mg every other daythen 40 mg 2x per week with 20 on other days Repeat BMET and BNP in 3 wks

## 2022-10-20 ENCOUNTER — Telehealth: Payer: Self-pay | Admitting: Physician Assistant

## 2022-10-20 NOTE — Telephone Encounter (Signed)
Paged by [employer] as recent heart monitor showed 2 episodes of NSVT, a single 10 beats run of NSVT on 09/27/2022 and another 10 beats run on 12/25. Will forward to Dr. Tenny Craw.

## 2022-10-21 ENCOUNTER — Telehealth: Payer: Self-pay

## 2022-10-21 ENCOUNTER — Other Ambulatory Visit: Payer: Self-pay

## 2022-10-21 NOTE — Telephone Encounter (Signed)
Received an alert from [employer] that the patient had 10 beats of V-tach. Called the patient and and she stated that she was feeling fine and had no symptoms at this time. Spoke to Dr. Graciela Husbands regarding the patient's V-tach. He recommended that the patient continue to take her metoprolol and he also sent a message to Dr. Dietrich Pates with options for her to consider. I informed the patient of Dr. Odessa Fleming recommendations and she was agreeable with this plan and had no further questions at this time.

## 2022-10-23 ENCOUNTER — Telehealth: Payer: Self-pay | Admitting: Internal Medicine

## 2022-10-23 NOTE — Addendum Note (Signed)
Addended by: Ferne Reus on: 10/23/2022 08:29 AM   Modules accepted: Orders

## 2022-10-23 NOTE — Telephone Encounter (Signed)
Patient would like to know if she should extend her heart monitoring cycle due to recent reports. She states the is scheduled to remove and return it on 12/30, but she assumes that she may need to wear it longer due to recent events. Patient understands that Dr. Tenny Craw will not return to the office before next week and she is appreciative of any feedback.

## 2022-10-23 NOTE — Telephone Encounter (Signed)
Patient is wearing 30 day event monitor.  I spoke with her and told her she should mail it back in at the end of the 30 days.

## 2022-10-29 ENCOUNTER — Telehealth: Payer: Self-pay

## 2022-10-29 DIAGNOSIS — I428 Other cardiomyopathies: Secondary | ICD-10-CM

## 2022-10-29 DIAGNOSIS — I493 Ventricular premature depolarization: Secondary | ICD-10-CM

## 2022-10-29 DIAGNOSIS — R9431 Abnormal electrocardiogram [ECG] [EKG]: Secondary | ICD-10-CM

## 2022-10-29 NOTE — Telephone Encounter (Signed)
Pt advised and agrees to a Cardiac MRI... pt was very tearful on the phone.... she says she is very overwhelmed with her life... she has to take care of her husband and all of her legal issues... I advised her that I will reach out to our social worker and see if she can help offer any resources to help her.

## 2022-10-29 NOTE — Telephone Encounter (Signed)
-----   Message from Paula Ross V, MD sent at 10/24/2022  3:57 PM EST ----- Please set patient up for cardiac MRI to evaluate given Hx of PVCs ----- Message ----- From: Klein, Steven C, MD Sent: 10/21/2022   4:44 PM EST To: Paula Ross V, MD; Richard I Cox, RN; #  Paula, I was asked to see this lady's event recorder on DOD.  3 observations, 1-has had multiple morphologies of PVCs going back to 2014 2-she has had left bundle atypical for years until the electrocardiogram 12/23 which is now right bundle 3-she is on a beta-blocker which is I think appropriate.  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    

## 2022-10-29 NOTE — Telephone Encounter (Signed)
-----   Message from Fay Records, MD sent at 10/24/2022  3:57 PM EST ----- Please set patient up for cardiac MRI to evaluate given Hx of PVCs ----- Message ----- From: Deboraha Sprang, MD Sent: 10/21/2022   4:44 PM EST To: Fay Records, MD; Louie Casa, RN; #  Nevin Bloodgood, I was asked to see this lady's event recorder on DOD.  3 observations, 1-has had multiple morphologies of PVCs going back to 2014 2-she has had left bundle atypical for years until the electrocardiogram 12/23 which is now right bundle 3-she is on a beta-blocker which is I think appropriate.  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

## 2022-10-31 ENCOUNTER — Telehealth: Payer: Self-pay | Admitting: Licensed Clinical Social Worker

## 2022-10-31 ENCOUNTER — Ambulatory Visit: Payer: Medicare PPO | Attending: Internal Medicine

## 2022-10-31 DIAGNOSIS — R9431 Abnormal electrocardiogram [ECG] [EKG]: Secondary | ICD-10-CM | POA: Diagnosis not present

## 2022-10-31 DIAGNOSIS — I493 Ventricular premature depolarization: Secondary | ICD-10-CM

## 2022-10-31 DIAGNOSIS — I428 Other cardiomyopathies: Secondary | ICD-10-CM

## 2022-10-31 DIAGNOSIS — Z79899 Other long term (current) drug therapy: Secondary | ICD-10-CM | POA: Diagnosis not present

## 2022-10-31 DIAGNOSIS — R600 Localized edema: Secondary | ICD-10-CM

## 2022-10-31 LAB — CBC

## 2022-10-31 NOTE — Telephone Encounter (Addendum)
H&V Care Navigation CSW Progress Note  Clinical Social Worker contacted patient by phone to f/u on request for resources for caregiving and assistance w/ her husband. Re-introduced self, role, reason for call. Pt had spoke with this writer in 2023 regarding stress related to home needs/caregiving.   LCSW was able to reach pt this morning at [phone], pt getting up for the day, requested I call her back which I was able to do. Pt shares that she continues to feel overwhelmed by her home and caregiving. She states that there are boxes of documents around the home, as well as all their things from downsizing. She and her husband live with their son Dian Situ who continues to assist them as able when home before and after he works. She has called some organizations but not actively utilized any assistance. She continues to care for her husband with dementia at home and is able to drive him and herself to and from appointments. She looked into several day programs for him but since he is not vaccinated and they continue to chose not to, they were limited.   LCSW and pt discussed multiple Writer, [place] for home modifications, Age with [facility] [place] and [place] on Aging. Pt wrote each of these and their contact information down as well as agreeing to me mailing information. We discussed I cannot recommend in home cleaning/organization resources but that some of the community resources provided may have recommendations they can give.   I encouraged pt to look into palliative care services for her husband as well as speaking with their [place] insurance policy on when would be a good time to utilize those benefits. For questions related to husbands condition and care I deferred her to speak with his care providers.   I mailed pt my card, senior resource  guide from [facility] and [employer] [facility] [place], I also sent Age with [facility] [place] flyer should she be interested in that information. I remain available as needed.  SDOH Screenings   Food Insecurity: No Food Insecurity (10/31/2022)  Housing: Low Risk  (10/31/2022)  Transportation Needs: No Transportation Needs (10/31/2022)  Utilities: Not At Risk (10/31/2022)  Financial Resource Strain: Medium Risk (11/08/2021)  Stress: Stress Concern Present (10/31/2022)  Tobacco Use: Medium Risk (10/08/2022)     Patient is participating in a Managed Medicaid Plan:  No, [employer] Medicare only.    Westley Hummer, MSW, [place]  [phone]- work cell phone (preferred) [phone]- desk phone

## 2022-11-01 LAB — PRO B NATRIURETIC PEPTIDE: NT-Pro BNP: 624 pg/mL (ref 0–738)

## 2022-11-01 LAB — BASIC METABOLIC PANEL
BUN/Creatinine Ratio: 26 (ref 12–28)
BUN: 31 mg/dL — ABNORMAL HIGH (ref 8–27)
CO2: 25 mmol/L (ref 20–29)
Calcium: 9.2 mg/dL (ref 8.7–10.3)
Chloride: 105 mmol/L (ref 96–106)
Creatinine, Ser: 1.18 mg/dL — ABNORMAL HIGH (ref 0.57–1.00)
Glucose: 69 mg/dL — ABNORMAL LOW (ref 70–99)
Potassium: 4.6 mmol/L (ref 3.5–5.2)
Sodium: 144 mmol/L (ref 134–144)
eGFR: 48 mL/min/{1.73_m2} — ABNORMAL LOW (ref >59)

## 2022-11-01 LAB — CBC
Hematocrit: 39.7 % (ref 34.0–46.6)
Hemoglobin: 12.6 g/dL (ref 11.1–15.9)
MCH: 29.6 pg (ref 26.6–33.0)
MCHC: 31.7 g/dL (ref 31.5–35.7)
MCV: 93 fL (ref 79–97)
Platelets: 171 10*3/uL (ref 150–450)
RBC: 4.26 x10E6/uL (ref 3.77–5.28)
RDW: 12.8 % (ref 11.7–15.4)
WBC: 5.3 10*3/uL (ref 3.4–10.8)

## 2022-11-06 DIAGNOSIS — J329 Chronic sinusitis, unspecified: Secondary | ICD-10-CM | POA: Diagnosis not present

## 2022-11-06 DIAGNOSIS — L304 Erythema intertrigo: Secondary | ICD-10-CM | POA: Diagnosis not present

## 2022-11-13 DIAGNOSIS — J069 Acute upper respiratory infection, unspecified: Secondary | ICD-10-CM | POA: Diagnosis not present

## 2022-11-13 DIAGNOSIS — L989 Disorder of the skin and subcutaneous tissue, unspecified: Secondary | ICD-10-CM | POA: Diagnosis not present

## 2022-11-18 ENCOUNTER — Telehealth (HOSPITAL_COMMUNITY): Payer: Self-pay | Admitting: *Deleted

## 2022-11-18 ENCOUNTER — Other Ambulatory Visit: Payer: Self-pay

## 2022-11-18 MED ORDER — METOPROLOL TARTRATE 25 MG PO TABS: 37.5000 mg | ORAL_TABLET | Freq: Two times a day (BID) | ORAL | 3 refills | Status: DC

## 2022-11-18 NOTE — Telephone Encounter (Signed)
Reaching out to patient to offer assistance regarding upcoming cardiac imaging study; pt verbalizes understanding of appt date/time, parking situation and where to check in, and verified current allergies; name and call back number provided for further questions should they arise  Gordy Clement RN Navigator Cardiac Imaging [facility] [phone] office [phone] cell  Patient reports knee replacement surgery.

## 2022-11-19 ENCOUNTER — Ambulatory Visit (HOSPITAL_COMMUNITY)
Admission: RE | Admit: 2022-11-19 | Discharge: 2022-11-19 | Disposition: A | Discharge status: Home / Self Care | Payer: Medicare PPO | Source: Ambulatory Visit | Attending: Internal Medicine | Admitting: Internal Medicine

## 2022-11-19 ENCOUNTER — Other Ambulatory Visit: Payer: Self-pay | Admitting: Internal Medicine

## 2022-11-19 DIAGNOSIS — I428 Other cardiomyopathies: Secondary | ICD-10-CM | POA: Insufficient documentation

## 2022-11-19 DIAGNOSIS — I493 Ventricular premature depolarization: Secondary | ICD-10-CM | POA: Diagnosis not present

## 2022-11-19 DIAGNOSIS — R9431 Abnormal electrocardiogram [ECG] [EKG]: Secondary | ICD-10-CM

## 2022-11-19 MED ORDER — GADOBUTROL 1 MMOL/ML IV SOLN
10.0000 mL | Freq: Once | INTRAVENOUS | Status: AC | PRN
  Administered 2022-11-19: 10 mL via INTRAVENOUS

## 2022-11-26 DIAGNOSIS — H6122 Impacted cerumen, left ear: Secondary | ICD-10-CM | POA: Diagnosis not present

## 2022-11-28 ENCOUNTER — Encounter: Payer: Self-pay | Admitting: Internal Medicine

## 2022-11-28 ENCOUNTER — Ambulatory Visit: Payer: Medicare PPO | Attending: Internal Medicine | Admitting: Internal Medicine

## 2022-11-28 DIAGNOSIS — I428 Other cardiomyopathies: Secondary | ICD-10-CM

## 2022-11-28 MED ORDER — SPIRONOLACTONE 25 MG PO TABS: 12.5000 mg | ORAL_TABLET | Freq: Every day | ORAL | 3 refills | Status: DC

## 2022-11-28 NOTE — Patient Instructions (Addendum)
Medication Instructions:  Your physician has recommended you make the following change in your medication:  1-START- Spirolactone 12.5 mg by mouth daily.  *If you need a refill on your cardiac medications before your next appointment, please call your pharmacy*  Lab Work: Your physician recommends that you return for lab work in: 2 weeks for BMET and BNP  If you have labs (blood work) drawn today and your tests are completely normal, you will receive your results only by: [place] (if you have MyChart) OR A paper copy in the mail If you have any lab test that is abnormal or we need to change your treatment, we will call you to review the results.  Testing/Procedures: None ordered today.  Follow-Up: At [facility], you and your health needs are our priority.  As part of our continuing mission to provide you with exceptional heart care, we have created designated Provider Care Teams.  These Care Teams include your primary Cardiologist (physician) and Advanced Practice Providers (APPs -  Physician Assistants and Nurse Practitioners) who all work together to provide you with the care you need, when you need it.  We recommend signing up for the patient portal called "MyChart".  Sign up information is provided on this After Visit Summary.  MyChart is used to connect with patients for Virtual Visits (Telemedicine).  Patients are able to view lab/test results, encounter notes, upcoming appointments, etc.  Non-urgent messages can be sent to your provider as well.   To learn more about what you can do with MyChart, go to [email or web address].    Your next appointment:   2 months  Provider:   Dorris Carnes, MD

## 2022-11-28 NOTE — Progress Notes (Unsigned)
Cardiology Office Note:    Date:  10/08/2022  ID:  Karlene, Southard 10/10/1946, MRN [id number]  PCP:  Harlan Stains, MD   [place] Providers Cardiologist:  Dorris Carnes, MD   Referring MD: Harlan Stains, MD    History of Present Illness:    LAYNE LEBON is a 77 y.o. female with a hx of HTN, NICM, GERD, HLD, reactive airway disease, mild AS, PVCs. Patient is followed by Dr. Harrington Challenger.  HOlteer monitor in 05/2018 that showed frequent PVCs with a 31% burden. Echocardiogram on 06/30/2018 showed EF 30-35% with moderate dilation of the LV, moderate LVH, mild-moderate aortic valve stenosis. 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  Right heart catheterization on 07/28/2018 showed moderate elevation of right heart pressures with moderate pulmonary htn. Patient had a repeat echocardiogram on 12/08/2018 that showed improvement in EF to 45-50%, abnormal septal motion consistent with LBBB. Repeat holter monitor worn in 03/2019 showed 7% PVC burden.   The pt was last in cardiology clinc in Nov 2023 when she saw Sandria Senter.   At that time she complained of stress due to home issues, complained of more fatigue,  She noted some SOB with laying flat Echo done in NOv 2023 showed LVEF 40 to 45%  DIfficult imaging     Also has a monitor in place  Since seen she continues to complain of fatigue   AGain, stresses remain.    She has issues with gout but is concerned about backing down on lasix   She deneis palpitations   No CP    I saw the pt in Dec 2023   Wore a monitor    Set up for a cardiac MRI  The pt still ahs cough with Drainage  Seen by ENT   Abx     Mucinex.     Past Medical History:  Diagnosis Date   Arthritis    Arthropathy, unspecified, site unspecified    COPD (chronic obstructive pulmonary disease) ([place])    Esophageal reflux    Essential hypertension 05/31/2017    Obesity, unspecified    Obstructive sleep apnea (adult) (pediatric)    Other acute sinusitis    Other primary cardiomyopathies    Psoriasis    Shortness of breath    Unspecified venous (peripheral) insufficiency     Past Surgical History:  Procedure Laterality Date   ABDOMINAL HYSTERECTOMY  2011   BLADDER SURGERY  2011   tac=ant/post   CHOLECYSTECTOMY N/A 06/01/2017   Procedure: LAPAROSCOPIC CHOLECYSTECTOMY;  Surgeon: Greer Pickerel, MD;  Location: [facility];  Service: General;  Laterality: N/A;   IRRIGATION AND DEBRIDEMENT OF WOUND WITH SPLIT THICKNESS SKIN GRAFT Right 01/25/2014   Procedure: RIGHT FOOT IRRIGATION AND DEBRIDEMENT WITH ACELL/SPLICK THICKNESS SKINGRAFT WITH VAC;  Surgeon: Theodoro Kos, DO;  Location: [facility];  Service: Plastics;  Laterality: Right;   PELVIC FLOOR REPAIR     REPLACEMENT TOTAL KNEE     rt and lt   RIGHT HEART CATH N/A 07/28/2018   Procedure: RIGHT HEART CATH;  Surgeon: Troy Sine, MD;  Location: [facility];  Service: Cardiovascular;  Laterality: N/A;   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   Cholecalciferol 25 MCG (1000 UT) capsule  Take 1,000 Units by mouth daily.   ENTRESTO 24-26 MG TAKE 1 TABLET BY MOUTH TWICE DAILY   fluticasone (FLONASE) 50 MCG/ACT nasal spray Place 2 sprays into both nostrils daily.   furosemide (LASIX) 20 MG tablet Take 20 mg alternating with 40 mg every other daythen 40 mg 2x per week with 20 on other days (starting 10/13/22)   guaiFENesin (MUCINEX) 600 MG 12 hr tablet Take by mouth 2 (two) times daily.   Melatonin 5 MG CAPS Take 10 mg by mouth as needed (sleep).   metoprolol tartrate (LOPRESSOR) 25 MG tablet Take 1.5 tablets (37.5 mg total) by mouth 2 (two) times daily.   pantoprazole (PROTONIX) 40 MG tablet Take 40 mg by mouth daily.    potassium chloride SA (KLOR-CON M) 20 MEQ  tablet TAKE 2 TABLETS BY MOUTH DAILY X'S 5 DAYS THEN GO BACK TO 1 TABLET BY MOUTH DAILY   Quercetin 250 MG TABS 1 tablet Orally takes 500 mg Once a day   rosuvastatin (CRESTOR) 5 MG tablet Take 1 tablet (5 mg total) by mouth daily.     Allergies:   Penicillins, Sulfa antibiotics, and Sulfonamide derivatives   Social History   Socioeconomic History   Marital status: Married    Spouse name: Not on file   Number of children: Not on file   Years of education: Not on file   Highest education level: Not on file  Occupational History   Occupation: Education officer, museum  Tobacco Use   Smoking status: Former    Packs/day: 0.50    Years: 10.00    Total pack years: 5.00    Types: Cigarettes    Quit date: 10/28/1979    Years since quitting: 43.1   Smokeless tobacco: Never  Vaping Use   Vaping Use: Never used  Substance and Sexual Activity   Alcohol use: Yes    Comment: rare   Drug use: No   Sexual activity: Not on file  Other Topics Concern   Not on file  Social History Narrative   Not on file   Social Determinants of Health   Financial Resource Strain: Medium Risk (11/08/2021)   Overall Financial Resource Strain (CARDIA)    Difficulty of Paying Living Expenses: Somewhat hard  Food Insecurity: No Food Insecurity (10/31/2022)   Hunger Vital Sign    Worried About Running Out of Food in the Last Year: Never true    [place] in the Last Year: Never true  Transportation Needs: No Transportation Needs (10/31/2022)   PRAPARE - Hydrologist (Medical): No    Lack of Transportation (Non-Medical): No  Physical Activity: Not on file  Stress: Stress Concern Present (10/31/2022)   [place]    Feeling of Stress : Very much  Social Connections: Not on file     Family History: The patient's family history includes Diabetes in her paternal grandfather; Diverticulitis in her father; Prostate cancer in  her father.  ROS:   Please see the history of present illness.     All other systems reviewed and are negative.  EKGs/Labs/Other Studies Reviewed:    The following studies were reviewed today:  Echo  Nov 2023  Left ventricular ejection fraction, by estimation, is 40 to 45%. The left ventricle has mildly decreased function. The left ventricle demonstrates global hypokinesis. The left ventricular internal cavity size was severely dilated. Left ventricular diastolic parameters are consistent with Grade II diastolic  dysfunction (pseudonormalization). Elevated left atrial pressure. 1. Right ventricular systolic function is normal. The right ventricular size is normal. Tricuspid regurgitation signal is inadequate for assessing PA pressure. 2. The mitral valve is normal in structure. Trivial mitral valve regurgitation. No evidence of mitral stenosis. 3. The aortic valve was not well visualized. Aortic valve regurgitation is trivial. Mild aortic valve stenosis 4. 5. Aortic dilatation noted. There is dilatation of the ascending aorta, measuring 40 mm. The inferior vena cava is dilated in size with >50% respiratory variability, suggesting right atrial pressure of 8 mmHg.  CT Coronary 07/19/2018  IMPRESSION: 1. Coronary calcium score of 9. This was 97 percentile for age and sex matched control.   2. Normal coronary origin with right dominance.   3. This study is affected by motion, however there is only mild non-obstructive CAD.   4. Pulmonary artery is severely dilated measuring 53 mm suggestive of pulmonary hypertension.  Long Term Monitor 03/2019  1. NSR with sinus bradycardia and sinus tachycardia 2. NSVT and NS atrial tachycardia 3. Frequent PVC's at time with bigeminy and trigeminy 4. No prolonged pauses   EKG:  EKG is not ordered today.    Recent Labs: 06/16/2022: ALT 12 10/31/2022: BUN 31; Creatinine, Ser 1.18; Hemoglobin 12.6; NT-Pro BNP 624; Platelets 171; Potassium  4.6; Sodium 144  Recent Lipid Panel    Component Value Date/Time   CHOL 196 03/05/2021 1447   TRIG 111 03/05/2021 1447   TRIG 75 10/23/2006 0812   HDL 73 03/05/2021 1447   CHOLHDL 2.7 03/05/2021 1447   CHOLHDL 3 10/31/2013 1440   VLDL 8.4 10/31/2013 1440   LDLCALC 104 (H) 03/05/2021 1447   LDLDIRECT 121.5 10/31/2013 1440           Physical Exam:    VS:  BP 118/68   Pulse 68   Ht 5\' 8"  (1.727 m)   Wt 243 lb (110.2 kg)   SpO2 97%   BMI 36.95 kg/m     BP 110/    P 59 bpm  Wt Readings from Last 3 Encounters:  11/28/22 243 lb (110.2 kg)  10/08/22 243 lb (110.2 kg)  09/16/22 249 lb 6.4 oz (113.1 kg)     GEN:  Obese 77 yo in NAD   Examined in chair HEENT: Normal NECK: No JVD  CARDIAC: RRR, no murmurs,  Tr LE edema  RESPIRATORY:  CTA   ABDOMEN: Soft, non-tender, MUSCULOSKELETAL: No gross deformities  SKIN: Warm and dry NEUROLOGIC:  Alert and oriented x 3 PSYCHIATRIC:  Normal affect    EKG    Sinus bradycardia   59 bpm   Frequent PVCs.   RBBB.   LAFB     ASSESSMENT:    No diagnosis found.   PLAN:    In order of problems listed above:  HFmrEF   Echo in Nov showed LVEF 40 to 45% (compared to reported 45 to 50%)     ON exam, volume status does not appear signficantly elevated.     Will get labs today     Continue current labs for now    Rhythm   Pt with hx frequent PVCs in past   (31/5 burden in 2019)  During recnet colonoscopy had frequent PACs, PVCs.   ? Afib    On metoprolol now   30 day monitor in place       Medication Adjustments/Labs and Tests Ordered: Current medicines are reviewed at length with the patient today.  Concerns regarding medicines are outlined  above.  No orders of the defined types were placed in this encounter.  No orders of the defined types were placed in this encounter.      Signed, Dietrich Pates, MD  [facility]

## 2022-12-01 DIAGNOSIS — D225 Melanocytic nevi of trunk: Secondary | ICD-10-CM | POA: Diagnosis not present

## 2022-12-01 DIAGNOSIS — L304 Erythema intertrigo: Secondary | ICD-10-CM | POA: Diagnosis not present

## 2022-12-10 DIAGNOSIS — K219 Gastro-esophageal reflux disease without esophagitis: Secondary | ICD-10-CM | POA: Diagnosis not present

## 2022-12-10 DIAGNOSIS — H938X3 Other specified disorders of ear, bilateral: Secondary | ICD-10-CM | POA: Diagnosis not present

## 2022-12-10 DIAGNOSIS — R1010 Upper abdominal pain, unspecified: Secondary | ICD-10-CM | POA: Diagnosis not present

## 2022-12-10 DIAGNOSIS — M109 Gout, unspecified: Secondary | ICD-10-CM | POA: Diagnosis not present

## 2022-12-10 DIAGNOSIS — L600 Ingrowing nail: Secondary | ICD-10-CM | POA: Diagnosis not present

## 2022-12-10 DIAGNOSIS — R490 Dysphonia: Secondary | ICD-10-CM | POA: Diagnosis not present

## 2022-12-12 ENCOUNTER — Other Ambulatory Visit: Payer: Self-pay

## 2022-12-12 ENCOUNTER — Ambulatory Visit: Payer: Medicare PPO | Attending: Internal Medicine

## 2022-12-12 DIAGNOSIS — I428 Other cardiomyopathies: Secondary | ICD-10-CM | POA: Diagnosis not present

## 2022-12-12 DIAGNOSIS — I10 Essential (primary) hypertension: Secondary | ICD-10-CM

## 2022-12-12 DIAGNOSIS — Z79899 Other long term (current) drug therapy: Secondary | ICD-10-CM

## 2022-12-13 LAB — BASIC METABOLIC PANEL
BUN/Creatinine Ratio: 23 (ref 12–28)
BUN: 24 mg/dL (ref 8–27)
CO2: 24 mmol/L (ref 20–29)
Calcium: 9.3 mg/dL (ref 8.7–10.3)
Chloride: 105 mmol/L (ref 96–106)
Creatinine, Ser: 1.03 mg/dL — ABNORMAL HIGH (ref 0.57–1.00)
Glucose: 82 mg/dL (ref 70–99)
Potassium: 4.6 mmol/L (ref 3.5–5.2)
Sodium: 144 mmol/L (ref 134–144)
eGFR: 56 mL/min/{1.73_m2} — ABNORMAL LOW (ref >59)

## 2022-12-13 LAB — PRO B NATRIURETIC PEPTIDE: NT-Pro BNP: 1172 pg/mL — ABNORMAL HIGH (ref 0–738)

## 2022-12-15 ENCOUNTER — Telehealth: Payer: Self-pay

## 2022-12-15 DIAGNOSIS — I428 Other cardiomyopathies: Secondary | ICD-10-CM

## 2022-12-15 DIAGNOSIS — R600 Localized edema: Secondary | ICD-10-CM

## 2022-12-15 DIAGNOSIS — I10 Essential (primary) hypertension: Secondary | ICD-10-CM

## 2022-12-15 DIAGNOSIS — Z79899 Other long term (current) drug therapy: Secondary | ICD-10-CM

## 2022-12-15 MED ORDER — FUROSEMIDE 40 MG PO TABS: 40.0000 mg | ORAL_TABLET | Freq: Every day | ORAL | 3 refills | Status: DC

## 2022-12-15 NOTE — Telephone Encounter (Signed)
Pt advised her lab results and I will call he later today when she is home to make the a lab appt

## 2022-12-15 NOTE — Telephone Encounter (Signed)
-----   Message from Dorris Carnes V, MD sent at 12/14/2022 10:04 PM EST ----- Electrolytes and kidney function are OK Fluid is up some    I would recomm 40 mg every day     CHeck BMET and BNP in 7 to 10 days

## 2022-12-15 NOTE — Telephone Encounter (Signed)
Left a message for the pt to make her 7-10 day lab appt.

## 2022-12-16 NOTE — Telephone Encounter (Signed)
Pt to have labs 12/26/22.

## 2022-12-26 ENCOUNTER — Ambulatory Visit: Payer: Medicare PPO | Attending: Cardiovascular Disease

## 2022-12-26 DIAGNOSIS — R600 Localized edema: Secondary | ICD-10-CM | POA: Diagnosis not present

## 2022-12-26 DIAGNOSIS — I428 Other cardiomyopathies: Secondary | ICD-10-CM

## 2022-12-26 DIAGNOSIS — I10 Essential (primary) hypertension: Secondary | ICD-10-CM

## 2022-12-26 DIAGNOSIS — Z79899 Other long term (current) drug therapy: Secondary | ICD-10-CM

## 2022-12-27 LAB — BASIC METABOLIC PANEL
BUN/Creatinine Ratio: 20 (ref 12–28)
BUN: 22 mg/dL (ref 8–27)
CO2: 25 mmol/L (ref 20–29)
Calcium: 9.3 mg/dL (ref 8.7–10.3)
Chloride: 105 mmol/L (ref 96–106)
Creatinine, Ser: 1.11 mg/dL — ABNORMAL HIGH (ref 0.57–1.00)
Glucose: 87 mg/dL (ref 70–99)
Potassium: 4.6 mmol/L (ref 3.5–5.2)
Sodium: 147 mmol/L — ABNORMAL HIGH (ref 134–144)
eGFR: 51 mL/min/{1.73_m2} — ABNORMAL LOW (ref >59)

## 2022-12-27 LAB — PRO B NATRIURETIC PEPTIDE: NT-Pro BNP: 921 pg/mL — ABNORMAL HIGH (ref 0–738)

## 2022-12-29 ENCOUNTER — Telehealth: Payer: Self-pay

## 2022-12-29 DIAGNOSIS — I10 Essential (primary) hypertension: Secondary | ICD-10-CM

## 2022-12-29 NOTE — Telephone Encounter (Signed)
Patient is scheduled for repeat BMET on 01/02/23. The patient has been notified of the result and verbalized understanding.  All questions (if any) were answered. Gershon Crane, LPN [id number] PM

## 2023-01-02 ENCOUNTER — Other Ambulatory Visit (HOSPITAL_COMMUNITY): Payer: Medicare PPO

## 2023-01-02 ENCOUNTER — Ambulatory Visit: Payer: Medicare PPO | Attending: Internal Medicine

## 2023-01-02 DIAGNOSIS — I10 Essential (primary) hypertension: Secondary | ICD-10-CM

## 2023-01-03 LAB — BASIC METABOLIC PANEL
BUN/Creatinine Ratio: 14 (ref 12–28)
BUN: 18 mg/dL (ref 8–27)
CO2: 24 mmol/L (ref 20–29)
Calcium: 9.3 mg/dL (ref 8.7–10.3)
Chloride: 104 mmol/L (ref 96–106)
Creatinine, Ser: 1.26 mg/dL — ABNORMAL HIGH (ref 0.57–1.00)
Glucose: 133 mg/dL — ABNORMAL HIGH (ref 70–99)
Potassium: 4.6 mmol/L (ref 3.5–5.2)
Sodium: 146 mmol/L — ABNORMAL HIGH (ref 134–144)
eGFR: 44 mL/min/{1.73_m2} — ABNORMAL LOW (ref >59)

## 2023-01-07 ENCOUNTER — Telehealth: Payer: Self-pay

## 2023-01-07 DIAGNOSIS — Z79899 Other long term (current) drug therapy: Secondary | ICD-10-CM

## 2023-01-07 DIAGNOSIS — R600 Localized edema: Secondary | ICD-10-CM

## 2023-01-07 DIAGNOSIS — I10 Essential (primary) hypertension: Secondary | ICD-10-CM

## 2023-01-07 DIAGNOSIS — I428 Other cardiomyopathies: Secondary | ICD-10-CM

## 2023-01-07 MED ORDER — POTASSIUM CHLORIDE CRYS ER 20 MEQ PO TBCR: EXTENDED_RELEASE_TABLET | ORAL | 3 refills | Status: DC

## 2023-01-07 MED ORDER — FUROSEMIDE 40 MG PO TABS: ORAL_TABLET | ORAL | 3 refills | Status: DC

## 2023-01-07 NOTE — Telephone Encounter (Signed)
Pt advised and will have her labs drawn at her OV 01/27/23 with Dr Harrington Challenger per her request.

## 2023-01-07 NOTE — Telephone Encounter (Signed)
-----   Message from Fay Records, MD sent at 01/04/2023  9:15 PM EDT ----- Sodium is a little better  146 I would take 40 mg one day then 20 mg the next     On days of 20 lasix dont take Potassium Follow up BMET and BNP in 3 wks

## 2023-01-12 DIAGNOSIS — Z01411 Encounter for gynecological examination (general) (routine) with abnormal findings: Secondary | ICD-10-CM | POA: Diagnosis not present

## 2023-01-12 DIAGNOSIS — Z1231 Encounter for screening mammogram for malignant neoplasm of breast: Secondary | ICD-10-CM | POA: Diagnosis not present

## 2023-01-12 DIAGNOSIS — N819 Female genital prolapse, unspecified: Secondary | ICD-10-CM | POA: Diagnosis not present

## 2023-01-12 DIAGNOSIS — Z960 Presence of urogenital implants: Secondary | ICD-10-CM | POA: Diagnosis not present

## 2023-01-22 DIAGNOSIS — U071 COVID-19: Secondary | ICD-10-CM | POA: Diagnosis not present

## 2023-01-25 NOTE — Progress Notes (Unsigned)
Cardiology Office Note:    Date:  10/08/2022  ID:  Alleyah, Colley Jan 22, 1946, MRN [id number]  PCP:  Harlan Stains, MD    History of Present Illness:    Crystal Yoder is a 77 y.o. female with a hx of HTN, NICM, GERD, HLD, reactive airway disease, mild AS, PVCs. Patient is followed by Dr. Harrington Challenger.  HOlteer monitor in 05/2018 that showed frequent PVCs with a 31% burden. Echocardiogram on 06/30/2018 showed EF 30-35% with moderate dilation of the LV, moderate LVH, mild-moderate aortic valve stenosis. 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  Right heart catheterization on 07/28/2018 showed moderate elevation of right heart pressures with moderate pulmonary htn. Patient had a repeat echocardiogram on 12/08/2018 that showed improvement in EF to 45-50%, abnormal septal motion consistent with LBBB. Repeat holter monitor worn in 03/2019 showed 7% PVC burden.   Echo done in NOv 2023 showed LVEF 40 to 45%  DIfficult imaging     Also has a monitor placed.     This showed 6% PVCs   Recomm   Since seen she continues to complain of fatigue   AGain, stresses remain.    She has issues with gout but is concerned about backing down on lasix   She deneis palpitations   No CP    I saw the pt in Dec 2023  When I saw her she complained of fatigue, of being under increased stress.  EKG showed PVCs and I recomm she wear a mointor  Set up for a cardiac MRI  See below  showed LVEF 36%  There did not appear to be an infiltrative process  The pt still has  cough with Drainage  Seen by ENT   Given Rx for Abx  and Mucinex.    The pt denies CP  Breathing is stable   I saw the pt in clinic in Feb 2024 Aldactone added to regimen      Past Medical History:  Diagnosis Date   Arthritis    Arthropathy, unspecified, site unspecified    COPD (chronic obstructive pulmonary disease) (HCC)    Esophageal reflux     Essential hypertension 05/31/2017   Obesity, unspecified    Obstructive sleep apnea (adult) (pediatric)    Other acute sinusitis    Other primary cardiomyopathies    Psoriasis    Shortness of breath    Unspecified venous (peripheral) insufficiency     Past Surgical History:  Procedure Laterality Date   ABDOMINAL HYSTERECTOMY  2011   BLADDER SURGERY  2011   tac=ant/post   CHOLECYSTECTOMY N/A 06/01/2017   Procedure: LAPAROSCOPIC CHOLECYSTECTOMY;  Surgeon: Greer Pickerel, MD;  Location: [facility];  Service: General;  Laterality: N/A;   IRRIGATION AND DEBRIDEMENT OF WOUND WITH SPLIT THICKNESS SKIN GRAFT Right 01/25/2014   Procedure: RIGHT FOOT IRRIGATION AND DEBRIDEMENT WITH ACELL/SPLICK THICKNESS SKINGRAFT WITH VAC;  Surgeon: Theodoro Kos, DO;  Location: [facility];  Service: Plastics;  Laterality: Right;   PELVIC FLOOR REPAIR     REPLACEMENT TOTAL KNEE     rt and lt   RIGHT HEART CATH N/A 07/28/2018   Procedure: RIGHT HEART CATH;  Surgeon: Troy Sine, MD;  Location: [facility];  Service: Cardiovascular;  Laterality: N/A;   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     Allergies:   Penicillins, Sulfa antibiotics, and Sulfonamide derivatives   Social History   Socioeconomic History   Marital status: Married    Spouse name: Not on file   Number of children: Not on file   Years of education: Not on file   Highest education level: Not on file  Occupational History   Occupation: Education officer, museum  Tobacco Use   Smoking status: Former    Packs/day: 0.50    Years: 10.00    Additional pack years: 0.00    Total pack years: 5.00    Types: Cigarettes    Quit date: 10/28/1979    Years since quitting: 43.2   Smokeless tobacco: Never  Vaping Use   Vaping Use: Never used  Substance and Sexual Activity   Alcohol use: Yes     Comment: rare   Drug use: No   Sexual activity: Not on file  Other Topics Concern   Not on file  Social History Narrative   Not on file   Social Determinants of Health   Financial Resource Strain: Medium Risk (11/08/2021)   Overall Financial Resource Strain (CARDIA)    Difficulty of Paying Living Expenses: Somewhat hard  Food Insecurity: No Food Insecurity (10/31/2022)   Hunger Vital Sign    Worried About Running Out of Food in the Last Year: Never true    Ran Out of Food in the Last Year: Never true  Transportation Needs: No Transportation Needs (10/31/2022)   PRAPARE - Hydrologist (Medical): No    Lack of Transportation (Non-Medical): No  Physical Activity: Not on file  Stress: Stress Concern Present (10/31/2022)   [place]    Feeling of Stress : Very much  Social Connections: Not on file     Family History: The patient's family history includes Diabetes in her paternal grandfather; Diverticulitis in her father; Prostate cancer in her father.  ROS:   Please see the history of present illness.     All other systems reviewed and are negative.  EKGs/Labs/Other Studies Reviewed:    The following studies were reviewed today:  MRI 11/21/22  1. Moderately reduced left ventricular systolic function with mild LV dilation. LVEF 36%. Global hypokinesis with moderate hypokinesis of the basal to mid inferior wall and severe hypokinesis of the basal to apical inferoseptum.   2. Mildly reduced right ventricular systolic function with normal RV size. RVEF 37%.   3. No delayed myocardial enhancement. ECV 30%. No definite myocardial edema. No evidence of scar, fibrosis, inflammation, infarction or infiltrative cardiomyopathic process.   4.  Severe dilation of main pulmonary artery.      Echo  Nov 2023  Left ventricular ejection fraction, by estimation, is 40 to 45%. The left ventricle  has mildly decreased function. The left ventricle demonstrates global hypokinesis. The left ventricular internal cavity size was severely dilated. Left ventricular diastolic parameters are consistent with Grade II diastolic dysfunction (pseudonormalization). Elevated left atrial pressure. 1. Right ventricular systolic function is normal. The right ventricular size is normal. Tricuspid regurgitation signal is inadequate for assessing PA pressure. 2. The mitral valve is normal in structure. Trivial mitral valve regurgitation. No evidence of mitral stenosis. 3. The aortic valve was not well visualized. Aortic valve regurgitation is trivial. Mild aortic valve stenosis 4. 5. Aortic dilatation noted. There is dilatation of the ascending aorta, measuring 40 mm. The inferior vena  cava is dilated in size with >50% respiratory variability, suggesting right atrial pressure of 8 mmHg.  CT Coronary 07/19/2018  IMPRESSION: 1. Coronary calcium score of 9. This was 29 percentile for age and sex matched control.   2. Normal coronary origin with right dominance.   3. This study is affected by motion, however there is only mild non-obstructive CAD.   4. Pulmonary artery is severely dilated measuring 53 mm suggestive of pulmonary hypertension.  Long Term Monitor 03/2019  1. NSR with sinus bradycardia and sinus tachycardia 2. NSVT and NS atrial tachycardia 3. Frequent PVC's at time with bigeminy and trigeminy 4. No prolonged pauses   EKG:  EKG is not not ordered today.    Recent Labs: 06/16/2022: ALT 12 10/31/2022: Hemoglobin 12.6; Platelets 171 12/26/2022: NT-Pro BNP 921 01/02/2023: BUN 18; Creatinine, Ser 1.26; Potassium 4.6; Sodium 146  Recent Lipid Panel    Component Value Date/Time   CHOL 196 03/05/2021 1447   TRIG 111 03/05/2021 1447   TRIG 75 10/23/2006 0812   HDL 73 03/05/2021 1447   CHOLHDL 2.7 03/05/2021 1447   CHOLHDL 3 10/31/2013 1440   VLDL 8.4 10/31/2013 1440   LDLCALC 104 (H)  03/05/2021 1447   LDLDIRECT 121.5 10/31/2013 1440           Physical Exam:    VS:  There were no vitals taken for this visit.    BP 110/    P 59 bpm  Wt Readings from Last 3 Encounters:  11/28/22 243 lb (110.2 kg)  10/08/22 243 lb (110.2 kg)  09/16/22 249 lb 6.4 oz (113.1 kg)     GEN:  Obese 77 yo in NAD   Examined in chair HEENT: Normal NECK: JVP is not elevated  CARDIAC: RRR, no murmurs,  Tr LE edema  RESPIRATORY:  CTA   ABDOMEN: Soft, non-tender, MUSCULOSKELETAL: No gross deformities  SKIN: Warm and dry NEUROLOGIC:  Alert and oriented x 3 PSYCHIATRIC:  Normal affect    EKG    Not done today    ASSESSMENT:    No diagnosis found.   PLAN:    In order of problems listed above:  HFrEF   Recent MRI shows LVEF and RVEF lower than previously thought   Does not appear to be an infiltrative process She is currently on metoprolol and Entresto     I would add aldactone to regimen    Check BMET and BNP in 2 wks     Rhythm   Monitor ins Jan shows conduction dz   The pt had frequent PVCs 6%    Few brief bursts of VT  B BLocker increased at that time   LIpids  LDL 70  HDL 70     Plan for follow up in April   COnsider Jardiance at that time   Follow BP  Medication Adjustments/Labs and Tests Ordered: Current medicines are reviewed at length with the patient today.  Concerns regarding medicines are outlined above.  No orders of the defined types were placed in this encounter.  No orders of the defined types were placed in this encounter.      Signed, Dorris Carnes, MD  [facility]

## 2023-01-27 ENCOUNTER — Encounter: Payer: Self-pay | Admitting: Internal Medicine

## 2023-01-27 ENCOUNTER — Ambulatory Visit: Payer: Medicare PPO | Attending: Internal Medicine | Admitting: Internal Medicine

## 2023-01-27 VITALS — BP 130/58 | HR 59 | Ht 68.0 in | Wt 240.2 lb

## 2023-01-27 DIAGNOSIS — I5020 Unspecified systolic (congestive) heart failure: Secondary | ICD-10-CM | POA: Diagnosis not present

## 2023-01-27 NOTE — Patient Instructions (Signed)
Medication Instructions:   *If you need a refill on your cardiac medications before your next appointment, please call your pharmacy*   Lab Work:  If you have labs (blood work) drawn today and your tests are completely normal, you will receive your results only by: [place] (if you have MyChart) OR A paper copy in the mail If you have any lab test that is abnormal or we need to change your treatment, we will call you to review the results.   Testing/Procedures:    Follow-Up: At [facility], you and your health needs are our priority.  As part of our continuing mission to provide you with exceptional heart care, we have created designated Provider Care Teams.  These Care Teams include your primary Cardiologist (physician) and Advanced Practice Providers (APPs -  Physician Assistants and Nurse Practitioners) who all work together to provide you with the care you need, when you need it.  We recommend signing up for the patient portal called "MyChart".  Sign up information is provided on this After Visit Summary.  MyChart is used to connect with patients for Virtual Visits (Telemedicine).  Patients are able to view lab/test results, encounter notes, upcoming appointments, etc.  Non-urgent messages can be sent to your provider as well.   To learn more about what you can do with MyChart, go to [email or web address].    Your next appointment:   3 month(s)  Provider:   Dorris Carnes, MD     Other Instructions

## 2023-01-31 ENCOUNTER — Other Ambulatory Visit: Payer: Self-pay | Admitting: Internal Medicine

## 2023-02-12 ENCOUNTER — Ambulatory Visit: Payer: Medicare PPO

## 2023-02-15 ENCOUNTER — Other Ambulatory Visit: Payer: Self-pay | Admitting: Internal Medicine

## 2023-02-20 ENCOUNTER — Ambulatory Visit: Payer: Medicare PPO | Attending: Internal Medicine

## 2023-02-20 DIAGNOSIS — R600 Localized edema: Secondary | ICD-10-CM | POA: Diagnosis not present

## 2023-02-20 DIAGNOSIS — I428 Other cardiomyopathies: Secondary | ICD-10-CM

## 2023-02-20 DIAGNOSIS — I10 Essential (primary) hypertension: Secondary | ICD-10-CM | POA: Diagnosis not present

## 2023-02-20 DIAGNOSIS — Z79899 Other long term (current) drug therapy: Secondary | ICD-10-CM | POA: Diagnosis not present

## 2023-02-21 LAB — BASIC METABOLIC PANEL
BUN/Creatinine Ratio: 20 (ref 12–28)
BUN: 26 mg/dL (ref 8–27)
CO2: 24 mmol/L (ref 20–29)
Calcium: 9.3 mg/dL (ref 8.7–10.3)
Chloride: 105 mmol/L (ref 96–106)
Creatinine, Ser: 1.28 mg/dL — ABNORMAL HIGH (ref 0.57–1.00)
Glucose: 93 mg/dL (ref 70–99)
Potassium: 4.7 mmol/L (ref 3.5–5.2)
Sodium: 145 mmol/L — ABNORMAL HIGH (ref 134–144)
eGFR: 43 mL/min/{1.73_m2} — ABNORMAL LOW (ref >59)

## 2023-02-21 LAB — PRO B NATRIURETIC PEPTIDE: NT-Pro BNP: 551 pg/mL (ref 0–738)

## 2023-03-02 NOTE — Progress Notes (Signed)
Clarified with pt   Take 40 mg lasix on M and F   Take 20 KCL on those days  Take 20 mg on other days without KCL

## 2023-03-04 DIAGNOSIS — N182 Chronic kidney disease, stage 2 (mild): Secondary | ICD-10-CM | POA: Diagnosis not present

## 2023-03-04 DIAGNOSIS — E785 Hyperlipidemia, unspecified: Secondary | ICD-10-CM | POA: Diagnosis not present

## 2023-03-04 DIAGNOSIS — I420 Dilated cardiomyopathy: Secondary | ICD-10-CM | POA: Diagnosis not present

## 2023-03-04 DIAGNOSIS — E559 Vitamin D deficiency, unspecified: Secondary | ICD-10-CM | POA: Diagnosis not present

## 2023-03-04 DIAGNOSIS — Z636 Dependent relative needing care at home: Secondary | ICD-10-CM | POA: Diagnosis not present

## 2023-03-05 ENCOUNTER — Telehealth: Payer: Self-pay

## 2023-03-05 MED ORDER — FUROSEMIDE 20 MG PO TABS: ORAL_TABLET | ORAL | 3 refills | Status: DC

## 2023-03-05 NOTE — Telephone Encounter (Signed)
I spoke with the pt and to clarify she will take lasix 20 mg daily except 40 mg on Monday and Fridays.   She will continue her K and will let me know if she needs any sent to her Pharmacy.

## 2023-03-05 NOTE — Telephone Encounter (Signed)
-----   Message from Dietrich Pates V, MD sent at 02/24/2023 10:08 AM EDT ----- ELectrolytes overall OK  Na is 145 Cr is 1.28    FLuid number/ value  is improved  I would keep on same regimen

## 2023-03-05 NOTE — Telephone Encounter (Signed)
Late entry.. I called the pt yesterday 03/04/23 to go over her results and she is asking if she still has a "daily" dose of Lasix along with her 40 mg that she is taking twice a week.   I will clarify with Dr Tenny Craw and call her in the morning.

## 2023-04-03 ENCOUNTER — Other Ambulatory Visit: Payer: Self-pay | Admitting: *Deleted

## 2023-04-03 MED ORDER — ROSUVASTATIN CALCIUM 5 MG PO TABS: 5.0000 mg | ORAL_TABLET | Freq: Every day | ORAL | 3 refills | Status: DC

## 2023-04-10 DIAGNOSIS — J069 Acute upper respiratory infection, unspecified: Secondary | ICD-10-CM | POA: Diagnosis not present

## 2023-04-13 DIAGNOSIS — D849 Immunodeficiency, unspecified: Secondary | ICD-10-CM | POA: Diagnosis not present

## 2023-04-13 DIAGNOSIS — J069 Acute upper respiratory infection, unspecified: Secondary | ICD-10-CM | POA: Diagnosis not present

## 2023-04-17 ENCOUNTER — Telehealth: Payer: Self-pay | Admitting: Internal Medicine

## 2023-04-17 NOTE — Telephone Encounter (Signed)
Pt c/o medication issue:  1. Name of Medication:  Lasix   2. How are you currently taking this medication (dosage and times per day)?   3. Are you having a reaction (difficulty breathing--STAT)?   4. What is your medication issue?   Patient states she is sick and her PCP prescribed  Benzonatate. She would like to know if this will interact with Lasix.

## 2023-04-17 NOTE — Telephone Encounter (Signed)
Returned call to patient and shared response from Pharm D:  Benzonatate is ok to take with her other medications    Patient verbalized understanding and expressed appreciation for call.

## 2023-04-27 DIAGNOSIS — Z79899 Other long term (current) drug therapy: Secondary | ICD-10-CM | POA: Diagnosis not present

## 2023-04-27 DIAGNOSIS — E785 Hyperlipidemia, unspecified: Secondary | ICD-10-CM | POA: Diagnosis not present

## 2023-04-27 DIAGNOSIS — K219 Gastro-esophageal reflux disease without esophagitis: Secondary | ICD-10-CM | POA: Diagnosis not present

## 2023-04-27 DIAGNOSIS — M109 Gout, unspecified: Secondary | ICD-10-CM | POA: Diagnosis not present

## 2023-04-27 DIAGNOSIS — I700 Atherosclerosis of aorta: Secondary | ICD-10-CM | POA: Diagnosis not present

## 2023-04-27 DIAGNOSIS — H6122 Impacted cerumen, left ear: Secondary | ICD-10-CM | POA: Diagnosis not present

## 2023-04-27 DIAGNOSIS — G4730 Sleep apnea, unspecified: Secondary | ICD-10-CM | POA: Diagnosis not present

## 2023-04-27 DIAGNOSIS — E559 Vitamin D deficiency, unspecified: Secondary | ICD-10-CM | POA: Diagnosis not present

## 2023-04-27 DIAGNOSIS — Z0000 Encounter for general adult medical examination without abnormal findings: Secondary | ICD-10-CM | POA: Diagnosis not present

## 2023-04-27 DIAGNOSIS — I420 Dilated cardiomyopathy: Secondary | ICD-10-CM | POA: Diagnosis not present

## 2023-04-27 LAB — COMPREHENSIVE METABOLIC PANEL: EGFR: 48

## 2023-04-28 NOTE — Progress Notes (Unsigned)
Cardiology Office Note:    Date:  10/08/2022  ID:  Crystal Yoder, Crystal Yoder 01-16-1946, MRN [id number]  PCP:  Laurann Montana, MD    History of Present Illness:    Crystal Yoder is a 77 y.o. female with a hx of HTN, NICM, GERD, HLD, reactive airway disease, mild AS, PVCs. HOlter monitor in 05/2018 that showed frequent PVCs with a 31% burden. Echo in Sept /2019 showed EF 30-35% with moderate dilation of the LV, moderate LVH, mild-moderate aortic valve stenosis. 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  Right heart catheterization on 07/28/2018 showed moderate elevation of right heart pressures with moderate pulmonary htn. Patient had a repeat echocardiogram on 12/08/2018 that showed improvement in EF to 45-50%, abnormal septal motion consistent with LBBB. Repeat holter monitor worn in 03/2019 showed 7% PVC burden.  Echo done in NOv 2023 showed LVEF 40 to 45%  Difficult windows.  Monitor showed 6% PVCs  Cardiac MRI showed LVEF 36%  There did not appear to be an infiltrative process le   I saw the pt in clinic in Feb 2024 Aldactone added to regimen     The pt was diagnosed with COVID last Wed.  Started Paxlovid Thursday    She has held some of her meds    SHe did not start spironolactone      Breathing is stable   No dizziness       I sawe the pt earlier this spring   The pt says she has had congestion in sinuses for about 1 month   Cough improved   Started on doxycycline  Monday   Appt with ENT pending Denies CP   She says she intermitt feels a bandlike sensation below rib cage/ breasts   Past Medical History:  Diagnosis Date   Arthritis    Arthropathy, unspecified, site unspecified    COPD (chronic obstructive pulmonary disease) (HCC)    Esophageal reflux    Essential hypertension 05/31/2017   Obesity, unspecified    Obstructive sleep apnea (adult) (pediatric)    Other acute  sinusitis    Other primary cardiomyopathies    Psoriasis    Shortness of breath    Unspecified venous (peripheral) insufficiency     Past Surgical History:  Procedure Laterality Date   ABDOMINAL HYSTERECTOMY  2011   BLADDER SURGERY  2011   tac=ant/post   CHOLECYSTECTOMY N/A 06/01/2017   Procedure: LAPAROSCOPIC CHOLECYSTECTOMY;  Surgeon: Gaynelle Adu, MD;  Location: [facility];  Service: General;  Laterality: N/A;   IRRIGATION AND DEBRIDEMENT OF WOUND WITH SPLIT THICKNESS SKIN GRAFT Right 01/25/2014   Procedure: RIGHT FOOT IRRIGATION AND DEBRIDEMENT WITH ACELL/SPLICK THICKNESS SKINGRAFT WITH VAC;  Surgeon: Wayland Denis, DO;  Location: [facility];  Service: Plastics;  Laterality: Right;   PELVIC FLOOR REPAIR     REPLACEMENT TOTAL KNEE     rt and lt   RIGHT HEART CATH N/A 07/28/2018   Procedure: RIGHT HEART CATH;  Surgeon: Lennette Bihari, MD;  [facility]: [facility];  Service: Cardiovascular;  Laterality: N/A;   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   Cholecalciferol 25 MCG (1000 UT) capsule Take 1,000 Units by mouth daily.   fluticasone (FLONASE) 50 MCG/ACT nasal spray Place 2  sprays into both nostrils daily.   furosemide (LASIX) 20 MG tablet Take one tablet (20 mg) by mouth every other day alternating with 2 tablets (40 mg) on the opposite days.   guaiFENesin (MUCINEX) 600 MG 12 hr tablet Take by mouth 2 (two) times daily.   Melatonin 5 MG CAPS Take 10 mg by mouth as needed (sleep).   metoprolol tartrate (LOPRESSOR) 25 MG tablet Take 1.5 tablets (37.5 mg total) by mouth 2 (two) times daily.   pantoprazole (PROTONIX) 40 MG tablet Take 40 mg by mouth every other day.   potassium chloride SA (KLOR-CON M) 20 MEQ tablet Take 1 tablet (20 meq) by mouth every other day on the same day as your lasix 40 mg.   Quercetin 250 MG TABS 1 tablet Orally takes  500 mg Once a day   rosuvastatin (CRESTOR) 5 MG tablet Take 1 tablet (5 mg total) by mouth daily.   sacubitril-valsartan (ENTRESTO) 24-26 MG TAKE 1 TABLET BY MOUTH TWICE DAILY   spironolactone (ALDACTONE) 25 MG tablet Take 0.5 tablets (12.5 mg total) by mouth daily.     Allergies:   Penicillins, Sulfa antibiotics, and Sulfonamide derivatives   Social History   Socioeconomic History   Marital status: Married    Spouse name: Not on file   Number of children: Not on file   Years of education: Not on file   Highest education level: Not on file  Occupational History   Occupation: Engineer, site  Tobacco Use   Smoking status: Former    Packs/day: 0.50    Years: 10.00    Additional pack years: 0.00    Total pack years: 5.00    Types: Cigarettes    Quit date: 10/28/1979    Years since quitting: 43.5   Smokeless tobacco: Never  Vaping Use   Vaping Use: Never used  Substance and Sexual Activity   Alcohol use: Yes    Comment: rare   Drug use: No   Sexual activity: Not on file  Other Topics Concern   Not on file  Social History Narrative   Not on file   Social Determinants of Health   Financial Resource Strain: Medium Risk (11/08/2021)   Overall Financial Resource Strain (CARDIA)    Difficulty of Paying Living Expenses: Somewhat hard  Food Insecurity: No Food Insecurity (10/31/2022)   Hunger Vital Sign    Worried About Running Out of Food in the Last Year: Never true    Ran Out of Food in the Last Year: Never true  Transportation Needs: No Transportation Needs (10/31/2022)   PRAPARE - Administrator, Civil Service (Medical): No    Lack of Transportation (Non-Medical): No  Physical Activity: Not on file  Stress: Stress Concern Present (10/31/2022)   [employer] of Occupational Health - Occupational Stress Questionnaire    Feeling of Stress : Very much  Social Connections: Not on file     Family History: The patient's family history includes Diabetes in her  paternal grandfather; Diverticulitis in her father; Prostate cancer in her father.  ROS:   Please see the history of present illness.     All other systems reviewed and are negative.  EKGs/Labs/Other Studies Reviewed:    The following studies were reviewed today:  MRI 11/21/22  1. Moderately reduced left ventricular systolic function with mild LV dilation. LVEF 36%. Global hypokinesis with moderate hypokinesis of the basal to mid inferior wall and severe hypokinesis of the basal to apical inferoseptum.  2. Mildly reduced right ventricular systolic function with normal RV size. RVEF 37%.   3. No delayed myocardial enhancement. ECV 30%. No definite myocardial edema. No evidence of scar, fibrosis, inflammation, infarction or infiltrative cardiomyopathic process.   4.  Severe dilation of main pulmonary artery.      Echo  Nov 2023  Left ventricular ejection fraction, by estimation, is 40 to 45%. The left ventricle has mildly decreased function. The left ventricle demonstrates global hypokinesis. The left ventricular internal cavity size was severely dilated. Left ventricular diastolic parameters are consistent with Grade II diastolic dysfunction (pseudonormalization). Elevated left atrial pressure. 1. Right ventricular systolic function is normal. The right ventricular size is normal. Tricuspid regurgitation signal is inadequate for assessing PA pressure. 2. The mitral valve is normal in structure. Trivial mitral valve regurgitation. No evidence of mitral stenosis. 3. The aortic valve was not well visualized. Aortic valve regurgitation is trivial. Mild aortic valve stenosis 4. 5. Aortic dilatation noted. There is dilatation of the ascending aorta, measuring 40 mm. The inferior vena cava is dilated in size with >50% respiratory variability, suggesting right atrial pressure of 8 mmHg.  CT Coronary 07/19/2018  IMPRESSION: 1. Coronary calcium score of 9. This was 76  percentile for age and sex matched control.   2. Normal coronary origin with right dominance.   3. This study is affected by motion, however there is only mild non-obstructive CAD.   4. Pulmonary artery is severely dilated measuring 53 mm suggestive of pulmonary hypertension.  Long Term Monitor 03/2019  1. NSR with sinus bradycardia and sinus tachycardia 2. NSVT and NS atrial tachycardia 3. Frequent PVC's at time with bigeminy and trigeminy 4. No prolonged pauses   EKG:  EKG is not not ordered today.    Recent Labs: 06/16/2022: ALT 12 10/31/2022: Hemoglobin 12.6; Platelets 171 02/20/2023: BUN 26; Creatinine, Ser 1.28; NT-Pro BNP 551; Potassium 4.7; Sodium 145  Recent Lipid Panel    Component Value Date/Time   CHOL 196 03/05/2021 1447   TRIG 111 03/05/2021 1447   TRIG 75 10/23/2006 0812   HDL 73 03/05/2021 1447   CHOLHDL 2.7 03/05/2021 1447   CHOLHDL 3 10/31/2013 1440   VLDL 8.4 10/31/2013 1440   LDLCALC 104 (H) 03/05/2021 1447   LDLDIRECT 121.5 10/31/2013 1440           Physical Exam:    VS:  BP 104/68   Pulse (!) 55   Ht 5\' 8"  (1.727 m)   Wt 232 lb (105.2 kg)   SpO2 97%   BMI 35.28 kg/m     BP 110/    P 59 bpm  Wt Readings from Last 3 Encounters:  04/29/23 232 lb (105.2 kg)  01/27/23 240 lb 3.2 oz (109 kg)  11/28/22 243 lb (110.2 kg)     GEN:  Obese 77 yo in NAD   Examined in chair HEENT: Normal NECK: JVP is not elevated   CARDIAC: RRR, no murmur  No significant LE edema  RESPIRATORY:  CTA   ABDOMEN: Soft, non-tender,  No hepatomegaly     ASSESSMENT:     HFrEF  Nonischemic   MRI LVEF 36%   Today volume status is good   I would keep on same regimen     Rhythm  Hx of PVCs   Monitor showed PVC burden 6%   Continue b blocker      LIpids  LDL 70  HDL 70  (Aug 2023)    Follow in fall  URI  Pt with sinus symptms   Now on doxycycline , just started   Has appt in ENT soon  Plan for follow up later this fall.          Signed, Dietrich Pates, MD   [facility]

## 2023-04-29 ENCOUNTER — Encounter: Payer: Self-pay | Admitting: Internal Medicine

## 2023-04-29 ENCOUNTER — Ambulatory Visit: Payer: Medicare PPO | Attending: Internal Medicine | Admitting: Internal Medicine

## 2023-04-29 VITALS — BP 104/68 | HR 55 | Ht 68.0 in | Wt 232.0 lb

## 2023-04-29 DIAGNOSIS — I10 Essential (primary) hypertension: Secondary | ICD-10-CM

## 2023-04-29 NOTE — Patient Instructions (Signed)
Medication Instructions:  Your physician recommends that you continue on your current medications as directed. Please refer to the Current Medication list given to you today.  *If you need a refill on your cardiac medications before your next appointment, please call your pharmacy*  Follow-Up: At [facility], you and your health needs are our priority.  As part of our continuing mission to provide you with exceptional heart care, we have created designated Provider Care Teams.  These Care Teams include your primary Cardiologist (physician) and Advanced Practice Providers (APPs -  Physician Assistants and Nurse Practitioners) who all work together to provide you with the care you need, when you need it.  We recommend signing up for the patient portal called "MyChart".  Sign up information is provided on this After Visit Summary.  MyChart is used to connect with patients for Virtual Visits (Telemedicine).  Patients are able to view lab/test results, encounter notes, upcoming appointments, etc.  Non-urgent messages can be sent to your provider as well.   To learn more about what you can do with MyChart, go to [email or web address].    Your next appointment:   October 2024  Provider:   Dietrich Pates, MD

## 2023-05-11 ENCOUNTER — Other Ambulatory Visit: Payer: Self-pay

## 2023-05-11 MED ORDER — POTASSIUM CHLORIDE CRYS ER 20 MEQ PO TBCR: EXTENDED_RELEASE_TABLET | ORAL | 3 refills | Status: DC

## 2023-05-15 ENCOUNTER — Other Ambulatory Visit: Payer: Self-pay | Admitting: Internal Medicine

## 2023-05-21 DIAGNOSIS — F329 Major depressive disorder, single episode, unspecified: Secondary | ICD-10-CM | POA: Diagnosis not present

## 2023-05-21 DIAGNOSIS — M1990 Unspecified osteoarthritis, unspecified site: Secondary | ICD-10-CM | POA: Diagnosis not present

## 2023-05-21 DIAGNOSIS — R32 Unspecified urinary incontinence: Secondary | ICD-10-CM | POA: Diagnosis not present

## 2023-05-21 DIAGNOSIS — I509 Heart failure, unspecified: Secondary | ICD-10-CM | POA: Diagnosis not present

## 2023-05-21 DIAGNOSIS — I2510 Atherosclerotic heart disease of native coronary artery without angina pectoris: Secondary | ICD-10-CM | POA: Diagnosis not present

## 2023-05-21 DIAGNOSIS — G4733 Obstructive sleep apnea (adult) (pediatric): Secondary | ICD-10-CM | POA: Diagnosis not present

## 2023-05-21 DIAGNOSIS — K219 Gastro-esophageal reflux disease without esophagitis: Secondary | ICD-10-CM | POA: Diagnosis not present

## 2023-05-21 DIAGNOSIS — E785 Hyperlipidemia, unspecified: Secondary | ICD-10-CM | POA: Diagnosis not present

## 2023-05-27 DIAGNOSIS — N8189 Other female genital prolapse: Secondary | ICD-10-CM | POA: Diagnosis not present

## 2023-05-29 DIAGNOSIS — M19071 Primary osteoarthritis, right ankle and foot: Secondary | ICD-10-CM | POA: Diagnosis not present

## 2023-05-29 DIAGNOSIS — M19072 Primary osteoarthritis, left ankle and foot: Secondary | ICD-10-CM | POA: Diagnosis not present

## 2023-06-08 DIAGNOSIS — H524 Presbyopia: Secondary | ICD-10-CM | POA: Diagnosis not present

## 2023-06-08 DIAGNOSIS — H35363 Drusen (degenerative) of macula, bilateral: Secondary | ICD-10-CM | POA: Diagnosis not present

## 2023-06-19 DIAGNOSIS — H938X3 Other specified disorders of ear, bilateral: Secondary | ICD-10-CM | POA: Diagnosis not present

## 2023-07-27 DIAGNOSIS — I700 Atherosclerosis of aorta: Secondary | ICD-10-CM | POA: Diagnosis not present

## 2023-07-27 DIAGNOSIS — E211 Secondary hyperparathyroidism, not elsewhere classified: Secondary | ICD-10-CM | POA: Diagnosis not present

## 2023-07-27 DIAGNOSIS — N1831 Chronic kidney disease, stage 3a: Secondary | ICD-10-CM | POA: Diagnosis not present

## 2023-07-27 DIAGNOSIS — E538 Deficiency of other specified B group vitamins: Secondary | ICD-10-CM | POA: Diagnosis not present

## 2023-07-27 DIAGNOSIS — I420 Dilated cardiomyopathy: Secondary | ICD-10-CM | POA: Diagnosis not present

## 2023-07-27 DIAGNOSIS — N183 Chronic kidney disease, stage 3 unspecified: Secondary | ICD-10-CM | POA: Diagnosis not present

## 2023-08-05 DIAGNOSIS — Z23 Encounter for immunization: Secondary | ICD-10-CM | POA: Diagnosis not present

## 2023-08-28 DIAGNOSIS — F331 Major depressive disorder, recurrent, moderate: Secondary | ICD-10-CM | POA: Diagnosis not present

## 2023-08-28 DIAGNOSIS — R1010 Upper abdominal pain, unspecified: Secondary | ICD-10-CM | POA: Diagnosis not present

## 2023-08-28 DIAGNOSIS — K219 Gastro-esophageal reflux disease without esophagitis: Secondary | ICD-10-CM | POA: Diagnosis not present

## 2023-08-28 DIAGNOSIS — I272 Pulmonary hypertension, unspecified: Secondary | ICD-10-CM | POA: Diagnosis not present

## 2023-08-28 DIAGNOSIS — I5022 Chronic systolic (congestive) heart failure: Secondary | ICD-10-CM | POA: Diagnosis not present

## 2023-08-28 DIAGNOSIS — M109 Gout, unspecified: Secondary | ICD-10-CM | POA: Diagnosis not present

## 2023-08-31 ENCOUNTER — Encounter: Payer: Self-pay | Admitting: Internal Medicine

## 2023-08-31 ENCOUNTER — Ambulatory Visit: Payer: Medicare PPO | Attending: Internal Medicine | Admitting: Internal Medicine

## 2023-08-31 VITALS — BP 112/86 | HR 50 | Ht 68.0 in | Wt 236.0 lb

## 2023-08-31 DIAGNOSIS — I5020 Unspecified systolic (congestive) heart failure: Secondary | ICD-10-CM

## 2023-08-31 NOTE — Patient Instructions (Signed)
Medication Instructions:  Your physician recommends that you continue on your current medications as directed. Please refer to the Current Medication list given to you today.  *If you need a refill on your cardiac medications before your next appointment, please call your pharmacy*  Lab Work: If you have labs (blood work) drawn today and your tests are completely normal, you will receive your results only by: MyChart Message (if you have MyChart) OR A paper copy in the mail If you have any lab test that is abnormal or we need to change your treatment, we will call you to review the results.  Follow-Up: At [facility], you and your health needs are our priority.  As part of our continuing mission to provide you with exceptional heart care, we have created designated Provider Care Teams.  These Care Teams include your primary Cardiologist (physician) and Advanced Practice Providers (APPs -  Physician Assistants and Nurse Practitioners) who all work together to provide you with the care you need, when you need it.  We recommend signing up for the patient portal called "MyChart".  Sign up information is provided on this After Visit Summary.  MyChart is used to connect with patients for Virtual Visits (Telemedicine).  Patients are able to view lab/test results, encounter notes, upcoming appointments, etc.  Non-urgent messages can be sent to your provider as well.   To learn more about what you can do with MyChart, go to [email or web address].    Your next appointment:   6 month(s)  Provider:   Dietrich Pates, MD

## 2023-08-31 NOTE — Progress Notes (Signed)
Cardiology Office Note:    Date:  10/08/2022  ID:  Crystal Yoder, Crystal Yoder 12/06/1945, MRN [id number]  PCP:  Laurann Montana, MD    History of Present Illness:    Crystal Yoder is a 77 y.o. female with a hx of HTN, NICM, GERD, HLD, reactive airway disease, mild AS, PVCs. HOlter monitor in 05/2018 that showed frequent PVCs with a 31% burden. Echo in Sept /2019 showed EF 30-35% with moderate dilation of the LV, moderate LVH, mild-moderate aortic valve stenosis.  Sept 2019  CCTA  Ca score was  9 (38th percentile), mild, non-obstructive CAD, severely dilated pulmonary artery measuring 53 mm.   Oct 2019  Right heart catheterization showed moderate elevation of right heart pressures with moderate pulmonary htn.  FEb 2020 Repeat echo  showed improvement in EF to 45-50%, abnormal septal motion consistent with LBBB.  Holter monitor worn in 03/2019 showed 7% PVC burden.  Echo done in NOv 2023 showed LVEF 40 to 45%  Difficult windows.  Monitor showed 6% PVCs  Jan 2024:  Cardiac MRI showed LVEF 36%  There did not appear to be an infiltrative process  I saw the pt in clinic in Feb 2024 Aldactone added to regimen     I saw the pt in July 2024    Since seen she is busy at home caring for husband who has Altzheimers.     She denies CP   She does have a lot of abdominal pains   Given Rx for protonix   Plan for follow up with Dr Cliffton Asters      She also has LUQ sensation "like bar in abdomen " No precipitating events   No set time of day Does complain of back pain from helping lift husband     Tired a lot    Denies palpitations     Past Medical History:  Diagnosis Date   Arthritis    Arthropathy, unspecified, site unspecified    COPD (chronic obstructive pulmonary disease) (HCC)    Esophageal reflux    Essential hypertension 05/31/2017   Obesity, unspecified    Obstructive sleep apnea (adult) (pediatric)    Other acute sinusitis    Other primary cardiomyopathies    Psoriasis    Shortness of breath     Unspecified venous (peripheral) insufficiency     Past Surgical History:  Procedure Laterality Date   ABDOMINAL HYSTERECTOMY  2011   BLADDER SURGERY  2011   tac=ant/post   CHOLECYSTECTOMY N/A 06/01/2017   Procedure: LAPAROSCOPIC CHOLECYSTECTOMY;  Surgeon: Gaynelle Adu, MD;  Location: [facility];  Service: General;  Laterality: N/A;   IRRIGATION AND DEBRIDEMENT OF WOUND WITH SPLIT THICKNESS SKIN GRAFT Right 01/25/2014   Procedure: RIGHT FOOT IRRIGATION AND DEBRIDEMENT WITH ACELL/SPLICK THICKNESS SKINGRAFT WITH VAC;  Surgeon: Wayland Denis, DO;  Location: [facility];  Service: Plastics;  Laterality: Right;   PELVIC FLOOR REPAIR     REPLACEMENT TOTAL KNEE     rt and lt   RIGHT HEART CATH N/A 07/28/2018   Procedure: RIGHT HEART CATH;  Surgeon: Lennette Bihari, MD;  Location: [facility] INVASIVE CV LAB;  Service: Cardiovascular;  Laterality: N/A;   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   Cholecalciferol 25 MCG (1000 UT) capsule Take 1,000 Units by mouth daily.   fluticasone (FLONASE) 50 MCG/ACT nasal spray Place  2 sprays into both nostrils daily.   furosemide (LASIX) 20 MG tablet Take one tablet (20 mg) by mouth every other day alternating with 2 tablets (40 mg) on the opposite days.   guaiFENesin (MUCINEX) 600 MG 12 hr tablet Take by mouth 2 (two) times daily.   Melatonin 5 MG CAPS Take 10 mg by mouth as needed (sleep).   metoprolol tartrate (LOPRESSOR) 25 MG tablet TAKE 1 AND 1/2 TABLETS(37.5 MG) BY MOUTH TWICE DAILY   pantoprazole (PROTONIX) 40 MG tablet Take 40 mg by mouth every other day.   potassium chloride SA (KLOR-CON M) 20 MEQ tablet Take 1 tablet (20 meq) by mouth every other day on the same day as your lasix 40 mg.   Quercetin 250 MG TABS 1 tablet Orally takes 500 mg Once a day   rosuvastatin (CRESTOR) 5 MG tablet Take 1 tablet (5 mg total) by mouth  daily.   sacubitril-valsartan (ENTRESTO) 24-26 MG TAKE 1 TABLET BY MOUTH TWICE DAILY   spironolactone (ALDACTONE) 25 MG tablet Take 0.5 tablets (12.5 mg total) by mouth daily.     Allergies:   Penicillins, Sulfa antibiotics, and Sulfonamide derivatives   Social History   Socioeconomic History   Marital status: Married    Spouse name: Not on file   Number of children: Not on file   Years of education: Not on file   Highest education level: Not on file  Occupational History   Occupation: Engineer, site  Tobacco Use   Smoking status: Former    Current packs/day: 0.00    Average packs/day: 0.5 packs/day for 10.0 years (5.0 ttl pk-yrs)    Types: Cigarettes    Start date: 10/27/1969    Quit date: 10/28/1979    Years since quitting: 43.8   Smokeless tobacco: Never  Vaping Use   Vaping status: Never Used  Substance and Sexual Activity   Alcohol use: Yes    Comment: rare   Drug use: No   Sexual activity: Not on file  Other Topics Concern   Not on file  Social History Narrative   Not on file   Social Determinants of Health   Financial Resource Strain: Medium Risk (11/08/2021)   Overall Financial Resource Strain (CARDIA)    Difficulty of Paying Living Expenses: Somewhat hard  Food Insecurity: Low Risk  (06/19/2023)   Received from [facility]   Hunger Vital Sign    Worried About Running Out of Food in the Last Year: Never true    Ran Out of Food in the Last Year: Never true  Transportation Needs: No Transportation Needs (06/19/2023)   Received from [facility]    In the past 12 months, has lack of reliable transportation kept you from medical appointments, meetings, work or from getting things needed for daily living? : No  Physical Activity: Not on file  Stress: Stress Concern Present (10/31/2022)   [employer] of Occupational Health - Occupational Stress Questionnaire    Feeling of Stress : Very much  Social Connections: Not on file     Family  History: The patient's family history includes Diabetes in her paternal grandfather; Diverticulitis in her father; Prostate cancer in her father.   EKGs/Labs/Other Studies Reviewed:    The following studies were reviewed today:  MRI 11/21/22  1. Moderately reduced left ventricular systolic function with mild LV dilation. LVEF 36%. Global hypokinesis with moderate hypokinesis of the basal to mid inferior wall and severe hypokinesis of the basal to apical  inferoseptum.   2. Mildly reduced right ventricular systolic function with normal RV size. RVEF 37%.   3. No delayed myocardial enhancement. ECV 30%. No definite myocardial edema. No evidence of scar, fibrosis, inflammation, infarction or infiltrative cardiomyopathic process.   4.  Severe dilation of main pulmonary artery.      Echo  Nov 2023  Left ventricular ejection fraction, by estimation, is 40 to 45%. The left ventricle has mildly decreased function. The left ventricle demonstrates global hypokinesis. The left ventricular internal cavity size was severely dilated. Left ventricular diastolic parameters are consistent with Grade II diastolic dysfunction (pseudonormalization). Elevated left atrial pressure. 1. Right ventricular systolic function is normal. The right ventricular size is normal. Tricuspid regurgitation signal is inadequate for assessing PA pressure. 2. The mitral valve is normal in structure. Trivial mitral valve regurgitation. No evidence of mitral stenosis. 3. The aortic valve was not well visualized. Aortic valve regurgitation is trivial. Mild aortic valve stenosis 4. 5. Aortic dilatation noted. There is dilatation of the ascending aorta, measuring 40 mm. The inferior vena cava is dilated in size with >50% respiratory variability, suggesting right atrial pressure of 8 mmHg.  CT Coronary 07/19/2018  IMPRESSION: 1. Coronary calcium score of 9. This was 53 percentile for age and sex matched control.    2. Normal coronary origin with right dominance.   3. This study is affected by motion, however there is only mild non-obstructive CAD.   4. Pulmonary artery is severely dilated measuring 53 mm suggestive of pulmonary hypertension.  Long Term Monitor 03/2019  1. NSR with sinus bradycardia and sinus tachycardia 2. NSVT and NS atrial tachycardia 3. Frequent PVC's at time with bigeminy and trigeminy 4. No prolonged pauses   EKG:  EKG is not not ordered today.    Recent Labs: 10/31/2022: Hemoglobin 12.6; Platelets 171 02/20/2023: BUN 26; Creatinine, Ser 1.28; NT-Pro BNP 551; Potassium 4.7; Sodium 145  Recent Lipid Panel    Component Value Date/Time   CHOL 196 03/05/2021 1447   TRIG 111 03/05/2021 1447   TRIG 75 10/23/2006 0812   HDL 73 03/05/2021 1447   CHOLHDL 2.7 03/05/2021 1447   CHOLHDL 3 10/31/2013 1440   VLDL 8.4 10/31/2013 1440   LDLCALC 104 (H) 03/05/2021 1447   LDLDIRECT 121.5 10/31/2013 1440           Physical Exam:    VS:  BP 112/86   Pulse (!) 50   Ht 5\' 8"  (1.727 m)   Wt 236 lb (107 kg)   SpO2 97%   BMI 35.88 kg/m      Wt Readings from Last 3 Encounters:  08/31/23 236 lb (107 kg)  04/29/23 232 lb (105.2 kg)  01/27/23 240 lb 3.2 oz (109 kg)     GEN:  Obese 77 yo in NAD   Examined in chair HEENT: Normal NECK: JVP is not elevated   CARDIAC: RRR, no murmurs No significant LE edema  RESPIRATORY:  CTA   ABDOMEN: Soft, non-tender,  No hepatomegaly     ASSESSMENT:     HFrEF  Nonischemic   MRI LVEF 36%   Volume status is good   Pt is tolerating current regimen   BP is lower at times but she denies dizziness       Continue current dosing   Rhythm  Hx of PVCs  Last monitor PVC burden 6%    Keep on b blocker     LIpids  LDL 82  HDL 61 (JUly 2024)  Keep on Crestor    Abdominal pain   Followed by Dr Cliffton Asters   I do not think they represent cardiovascular symptoms     Signed, Dietrich Pates, MD  [facility]

## 2023-09-16 DIAGNOSIS — Z4689 Encounter for fitting and adjustment of other specified devices: Secondary | ICD-10-CM | POA: Diagnosis not present

## 2023-09-16 DIAGNOSIS — N993 Prolapse of vaginal vault after hysterectomy: Secondary | ICD-10-CM | POA: Diagnosis not present

## 2023-09-22 ENCOUNTER — Emergency Department (HOSPITAL_COMMUNITY): Payer: Medicare PPO

## 2023-09-22 ENCOUNTER — Telehealth: Payer: Self-pay | Admitting: Internal Medicine

## 2023-09-22 ENCOUNTER — Encounter (HOSPITAL_COMMUNITY): Payer: Self-pay | Admitting: Emergency Medicine

## 2023-09-22 ENCOUNTER — Emergency Department (HOSPITAL_COMMUNITY)
Admission: EM | Admit: 2023-09-22 | Discharge: 2023-09-23 | Disposition: A | Discharge status: Home / Self Care | Payer: Medicare PPO | Attending: Emergency Medicine | Admitting: Emergency Medicine

## 2023-09-22 ENCOUNTER — Other Ambulatory Visit: Payer: Self-pay

## 2023-09-22 DIAGNOSIS — R059 Cough, unspecified: Secondary | ICD-10-CM | POA: Insufficient documentation

## 2023-09-22 DIAGNOSIS — I517 Cardiomegaly: Secondary | ICD-10-CM | POA: Diagnosis not present

## 2023-09-22 DIAGNOSIS — R0989 Other specified symptoms and signs involving the circulatory and respiratory systems: Secondary | ICD-10-CM | POA: Insufficient documentation

## 2023-09-22 DIAGNOSIS — J439 Emphysema, unspecified: Secondary | ICD-10-CM | POA: Diagnosis not present

## 2023-09-22 DIAGNOSIS — R072 Precordial pain: Secondary | ICD-10-CM | POA: Diagnosis not present

## 2023-09-22 DIAGNOSIS — R079 Chest pain, unspecified: Secondary | ICD-10-CM | POA: Diagnosis not present

## 2023-09-22 DIAGNOSIS — J984 Other disorders of lung: Secondary | ICD-10-CM | POA: Diagnosis not present

## 2023-09-22 DIAGNOSIS — I771 Stricture of artery: Secondary | ICD-10-CM | POA: Diagnosis not present

## 2023-09-22 DIAGNOSIS — I700 Atherosclerosis of aorta: Secondary | ICD-10-CM | POA: Diagnosis not present

## 2023-09-22 LAB — BASIC METABOLIC PANEL
Anion gap: 9 (ref 5–15)
BUN: 27 mg/dL — ABNORMAL HIGH (ref 8–23)
CO2: 25 mmol/L (ref 22–32)
Calcium: 9.2 mg/dL (ref 8.9–10.3)
Chloride: 110 mmol/L (ref 98–111)
Creatinine, Ser: 1.25 mg/dL — ABNORMAL HIGH (ref 0.44–1.00)
GFR, Estimated: 44 mL/min — ABNORMAL LOW (ref >60)
Glucose, Bld: 123 mg/dL — ABNORMAL HIGH (ref 70–99)
Potassium: 4.2 mmol/L (ref 3.5–5.1)
Sodium: 144 mmol/L (ref 135–145)

## 2023-09-22 LAB — CBC
HCT: 41.1 % (ref 36.0–46.0)
Hemoglobin: 12.6 g/dL (ref 12.0–15.0)
MCH: 30.1 pg (ref 26.0–34.0)
MCHC: 30.7 g/dL (ref 30.0–36.0)
MCV: 98.1 fL (ref 80.0–100.0)
Platelets: 186 10*3/uL (ref 150–400)
RBC: 4.19 MIL/uL (ref 3.87–5.11)
RDW: 13.8 % (ref 11.5–15.5)
WBC: 7.3 10*3/uL (ref 4.0–10.5)
nRBC: 0 % (ref 0.0–0.2)

## 2023-09-22 LAB — TROPONIN I (HIGH SENSITIVITY)
Troponin I (High Sensitivity): 7 ng/L (ref <18)
Troponin I (High Sensitivity): 10 ng/L (ref <18)

## 2023-09-22 MED ORDER — IOHEXOL 350 MG/ML SOLN
75.0000 mL | Freq: Once | INTRAVENOUS | Status: AC | PRN
  Administered 2023-09-22: 75 mL via INTRAVENOUS

## 2023-09-22 NOTE — ED Triage Notes (Signed)
Pt here from home with c/o chest pain that has been ongoing for a couple of days , no n/v/or sob , pt does admit to a lot stress recently

## 2023-09-22 NOTE — Telephone Encounter (Signed)
Pt c/o of Chest Pain: STAT if CP now or developed within 24 hours  1. Are you having CP right now?  Yes, mild   2. Are you experiencing any other symptoms (ex. SOB, nausea, vomiting, sweating)?  Tiredness   3. How long have you been experiencing CP?  Started this morning.  4. Is your CP continuous or coming and going?  Coming and going, mainly when bending down   5. Have you taken Nitroglycerin?  No, patient states she does not have any nitro ?

## 2023-09-22 NOTE — Telephone Encounter (Signed)
Received call transferred from operator and spoke with patient.  She reports she started having chest pain when she got up this morning.  Seems worse when she is bending over.  Describes as a heaviness in her chest.  Episodes of heaviness have been occurring off and on today.  Episodes last about 5 minutes. Has had about 10 episodes of pain/heaviness today.  Currently having pain.  Different than the abdominal pain she has had in the past.  Abdominal pain was lower.  She has a new cough today.  Non productive.  Has pain also when taking a deep breath.  She has never had pain like this before.  I advised patient to call 911 for transport to hospital

## 2023-09-22 NOTE — ED Provider Notes (Signed)
[facility] Emergency Department Provider Note MRN:  [id number]  Arrival date & time: 09/23/23     Chief Complaint   Chest Pain   History of Present Illness   Crystal Yoder is a 77 y.o. year-old female presents to the ED with chief complaint of chest pain since this morning.  She reports that the pains have been intermittent.  Worse when bending. Denies SOB or radiating pain.  States that she has been coughing.  States that she has had some associated rhinorrhea. She denies fever.    States that she has been under a lot of stress.    She also states that there is a pressure beneath her left breast.  Reports that she has pain in the upper abdomen when she is getting out of bed.  History provided by patient.   Review of Systems  Pertinent positive and negative review of systems noted in HPI.    Physical Exam   Vitals:   09/22/23 2300 09/23/23 0141  BP: 113/61   Pulse: 61 67  Resp: 18 11  Temp:    SpO2: 97% 95%    CONSTITUTIONAL:  non toxic-appearing, NAD NEURO:  Alert and oriented x 3, CN 3-12 grossly intact EYES:  eyes equal and reactive ENT/NECK:  Supple, no stridor  CARDIO:  normal rate, regular rhythm, appears well-perfused  PULM:  No respiratory distress, CTAB GI/GU:  non-distended, non tender MSK/SPINE:  No gross deformities, no edema, moves all extremities  SKIN:  no rash, atraumatic   *Additional and/or pertinent findings included in MDM below  Diagnostic and Interventional Summary    EKG Interpretation Date/Time:    Ventricular Rate:    PR Interval:    QRS Duration:    QT Interval:    QTC Calculation:   R Axis:      Text Interpretation:           Labs Reviewed  BASIC METABOLIC PANEL - Abnormal; Notable for the following components:      Result Value   Glucose, Bld 123 (*)    BUN 27 (*)    Creatinine, Ser 1.25 (*)    GFR, Estimated 44 (*)    All other components within normal limits  CBC  TROPONIN I (HIGH  SENSITIVITY)  TROPONIN I (HIGH SENSITIVITY)    CT Angio Chest Aorta W and/or Wo Contrast  Final Result    DG Chest 2 View  Final Result      Medications  iohexol (OMNIPAQUE) 350 MG/ML injection 75 mL (75 mLs Intravenous Contrast Given 09/22/23 2343)     Procedures  /  Critical Care Procedures  ED Course and Medical Decision Making  I have reviewed the triage vital signs, the nursing notes, and pertinent available records from the EMR.  Social Determinants Affecting Complexity of Care: Patient has no clinically significant social determinants affecting this chief complaint..   ED Course: Clinical Course as of 09/23/23 0248  Wed Sep 23, 2023  0247 Troponin I (High Sensitivity) Initial troponin 7, repeat is 10, no acute ischemic changes on EKG, doubt ACS [RB]  0247 CBC No leukocytosis or anemia [RB]  0247 Basic metabolic panel(!) No significant electrolyte derangement [RB]  0248 Abnormal chest x-ray interpretation, I discussed the read with the radiologist, who suggest either outpatient follow-up versus CT aorta.  Will proceed with the CT tonight.  CT result was reassuring.  Questionable pulmonary hypertension.  I discussed the result with the patient, who will follow-up with her primary  care doctor.  Patient feels well now.  I think that she is appropriate for discharge and does not require any further emergent workup or consultation tonight. [RB]    Clinical Course User Index [RB] Roxy Horseman, PA-C    Medical Decision Making Patient with intermittent chest pressure that started this morning.  She denies any radiating pain or shortness of breath.  Denies any nausea or vomiting.  She states that she has been under a lot of stress.  Will check labs and imaging.  Amount and/or Complexity of Data Reviewed Labs: ordered. Decision-making details documented in ED Course. Radiology: ordered. ECG/medicine tests: ordered and independent interpretation performed.    Details:  RBBB  Risk Prescription drug management.         Consultants: No consultations were needed in caring for this patient.   Treatment and Plan: I considered admission due to patient's initial presentation, but after considering the examination and diagnostic results, patient will not require admission and can be discharged with outpatient follow-up.    Final Clinical Impressions(s) / ED Diagnoses     ICD-10-CM   1. Precordial chest pain  R07.2       ED Discharge Orders     None         Discharge Instructions Discussed with and Provided to Patient:     Discharge Instructions      Your chest CT scan showed some evidence of pulmonary hypertension.  Please discuss this finding with your doctor.   Please return for new or worsening symptoms.       Roxy Horseman, PA-C 09/23/23 0249    Tilden Fossa, MD 09/23/23 [phone]

## 2023-09-23 NOTE — Discharge Instructions (Signed)
Your chest CT scan showed some evidence of pulmonary hypertension.  Please discuss this finding with your doctor.   Please return for new or worsening symptoms.

## 2023-09-25 DIAGNOSIS — R072 Precordial pain: Secondary | ICD-10-CM | POA: Diagnosis not present

## 2023-09-25 NOTE — Telephone Encounter (Signed)
REviewed ER note. Ms Aberg did not appear to be significantly volume overloaded, but they did not check a BNP SHe could try taking 60 mg once and then resume schedule   See how feels      I have reviewed CT   Similar to previous       Please make sure pt has follow up in next several weeks    Will forward to A [place]n to review as well

## 2023-09-28 NOTE — Telephone Encounter (Signed)
Spoke with pt who reports she is feeling "good"  Pt reviewed Dr Charlott Rakes comments and recommendations.  Pt states she is due to take her second shingles vaccine today which she put off last week as she felt like she might be coming down with something.  Pt advised that will be up to her and her PCP but if she is still not feeling 100% she may want to wait.   Pt advised will have scheduling or dr Charlott Rakes nurse contact her to set up appointment to see Dr Tenny Craw.  Pt verbalizes understanding and thanked Charity fundraiser for the call.

## 2023-09-29 NOTE — Telephone Encounter (Signed)
Offered pt an appt for tomorrow at 2pm.. she will call back and let me know.

## 2023-09-30 ENCOUNTER — Ambulatory Visit: Payer: Medicare PPO | Attending: Internal Medicine | Admitting: Internal Medicine

## 2023-09-30 ENCOUNTER — Other Ambulatory Visit: Payer: Self-pay | Admitting: Internal Medicine

## 2023-09-30 ENCOUNTER — Encounter: Payer: Self-pay | Admitting: Internal Medicine

## 2023-09-30 VITALS — BP 120/84 | HR 46 | Ht 68.0 in | Wt 260.0 lb

## 2023-09-30 DIAGNOSIS — I5020 Unspecified systolic (congestive) heart failure: Secondary | ICD-10-CM | POA: Diagnosis not present

## 2023-09-30 NOTE — Patient Instructions (Signed)
Medication Instructions:   *If you need a refill on your cardiac medications before your next appointment, please call your pharmacy*   Lab Work: BMET, CBC, URIC ACID  If you have labs (blood work) drawn today and your tests are completely normal, you will receive your results only by: MyChart Message (if you have MyChart) OR A paper copy in the mail If you have any lab test that is abnormal or we need to change your treatment, we will call you to review the results.   Testing/Procedures:  [place] [facility] DEPT OF [place]. [facility] AT [facility] [address] [place], [place] [address] [place] [address] Dept: [phone] Loc: [phone]  JENNIYAH JASPERSON  09/30/2023  You are scheduled for a Cardiac Catheterization on Wednesday, December 11 with Dr. Tonny Bollman.  1. Please arrive at the [facility] (Main Entrance A) at [facility]: [address] [place], [place] [address] at 11:00 AM (This time is 2 hour(s) before your procedure to ensure your preparation).   Free valet parking service is available. You will check in at ADMITTING. The support person will be asked to wait in the waiting room.  It is OK to have someone drop you off and come back when you are ready to be discharged.    Special note: Every effort is made to have your procedure done on time. Please understand that emergencies sometimes delay scheduled procedures.  2. Diet: Do not eat solid foods after midnight.  The patient may have clear liquids until 5am upon the day of the procedure.  3. Labs: DONE ON 09/30/23  4. Medication instructions in preparation for your procedure: HOLD FOR FLUID PILLS    Contrast Allergy: No   On the morning of your procedure, take your Aspirin 81 mg and any morning medicines NOT listed above.  You may use sips of water.  5. Plan to go home the same day, you will only stay overnight if medically necessary. 6. Bring a current list of  your medications and current insurance cards. 7. You MUST have a responsible person to drive you home. 8. Someone MUST be with you the first 24 hours after you arrive home or your discharge will be delayed. 9. Please wear clothes that are easy to get on and off and wear slip-on shoes.  Thank you for allowing [place] to care for you!   -- [place] [facility]   Follow-Up: At [facility], you and your health needs are our priority.  As part of our continuing mission to provide you with exceptional heart care, we have created designated Provider Care Teams.  These Care Teams include your primary Cardiologist (physician) and Advanced Practice Providers (APPs -  Physician Assistants and Nurse Practitioners) who all work together to provide you with the care you need, when you need it.  We recommend signing up for the patient portal called "MyChart".  Sign up information is provided on this After Visit Summary.  MyChart is used to connect with patients for Virtual Visits (Telemedicine).  Patients are able to view lab/test results, encounter notes, upcoming appointments, etc.  Non-urgent messages can be sent to your provider as well.   To learn more about what you can do with MyChart, go to [email or web address].

## 2023-09-30 NOTE — Progress Notes (Addendum)
Cardiology Office Note:    Date:  10/08/2022  ID:  Crystal, Yoder 22-Feb-1946, MRN [id number]  PCP:  Laurann Montana, MD    History of Present Illness:    Crystal Yoder is a 77 y.o. female with a hx of HTN, NICM, GERD, HLD, reactive airway disease, mild AS, PVCs. HOlter monitor in 05/2018 that showed frequent PVCs with a 31% burden. Echo in Sept /2019 showed EF 30-35% with moderate dilation of the LV, moderate LVH, mild-moderate aortic valve stenosis.  -Sept 2019  CCTA  Ca score was  9 (38th percentile), mild, non-obstructive CAD, severely dilated pulmonary artery measuring 53 mm.   -Oct 2019  Right heart catheterization showed moderate elevation of right heart pressures with moderate pulmonary htn.  -FEb 2020 Repeat echo  showed improvement in EF to 45-50%, abnormal septal motion consistent with LBBB.  -June 2020 Holter monitor showed 7% PVC burden.  - NOv 2023 echo showed LVEF 40 to 45%  Difficult windows.   Monitor showed 6% PVCs  -Jan 2024:  Cardiac MRI showed LVEF 36%  There did not appear to be an infiltrative process  I saw the pt in clinic in Feb 2024 Aldactone added to regimen     The pt was last in cardiology clinc in Nov 2024    She was seen in ER on 09/22/23  She complained of chest pains  Worse with inspiration   Chest CT showed no PE   PA enlarged, suggestive of pulmonary HTN   This has been noted before    The patient also said she felt like a bar was under her breast, complains of a hardness on L breast   Not exacerbated with activity  Other times when moving around will have discomfort in lower abdomen   Overall is fatigued   She admits to a lot of stress as caring for husband who has dementia. Past Medical History:  Diagnosis Date   Arthritis    Arthropathy, unspecified, site unspecified    COPD (chronic obstructive pulmonary disease) (HCC)    Esophageal reflux    Essential hypertension 05/31/2017   Obesity, unspecified    Obstructive sleep apnea (adult)  (pediatric)    Other acute sinusitis    Other primary cardiomyopathies    Psoriasis    Shortness of breath    Unspecified venous (peripheral) insufficiency     Past Surgical History:  Procedure Laterality Date   ABDOMINAL HYSTERECTOMY  2011   BLADDER SURGERY  2011   tac=ant/post   CHOLECYSTECTOMY N/A 06/01/2017   Procedure: LAPAROSCOPIC CHOLECYSTECTOMY;  Surgeon: Gaynelle Adu, MD;  Location: [facility];  Service: General;  Laterality: N/A;   IRRIGATION AND DEBRIDEMENT OF WOUND WITH SPLIT THICKNESS SKIN GRAFT Right 01/25/2014   Procedure: RIGHT FOOT IRRIGATION AND DEBRIDEMENT WITH ACELL/SPLICK THICKNESS SKINGRAFT WITH VAC;  Surgeon: Wayland Denis, DO;  Location: [facility];  Service: Plastics;  Laterality: Right;   PELVIC FLOOR REPAIR     REPLACEMENT TOTAL KNEE     rt and lt   RIGHT HEART CATH N/A 07/28/2018   Procedure: RIGHT HEART CATH;  Surgeon: Lennette Bihari, MD;  Location: [facility] INVASIVE CV LAB;  Service: Cardiovascular;  Laterality: N/A;   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   Cholecalciferol 25 MCG (1000 UT) capsule Take 1,000 Units by mouth as  needed.   fluticasone (FLONASE) 50 MCG/ACT nasal spray Place 2 sprays into both nostrils daily as needed for allergies.   furosemide (LASIX) 40 MG tablet Take 40 mg by mouth. Per patient taking 40 mg tablets twice a week. Patient taking 20 mg tablets 5 times a week.   Melatonin 10 MG TABS Take 20 mg by mouth at bedtime as needed (sleep).   metoprolol tartrate (LOPRESSOR) 25 MG tablet TAKE 1 AND 1/2 TABLETS(37.5 MG) BY MOUTH TWICE DAILY   pantoprazole (PROTONIX) 40 MG tablet Take 40 mg by mouth every other day.   potassium chloride SA (KLOR-CON M) 20 MEQ tablet Take 1 tablet (20 meq) by mouth every other day on the same day as your lasix 40 mg. (Patient taking differently: Take 40 mEq by mouth See  admin instructions. Take 2 tablets by mouth when taking 2 tablets at Lasix)   Quercetin 250 MG TABS Take 500 mg by mouth as needed.   rosuvastatin (CRESTOR) 5 MG tablet Take 1 tablet (5 mg total) by mouth daily.   sacubitril-valsartan (ENTRESTO) 24-26 MG TAKE 1 TABLET BY MOUTH TWICE DAILY   spironolactone (ALDACTONE) 25 MG tablet Take 0.5 tablets (12.5 mg total) by mouth daily.   [DISCONTINUED] furosemide (LASIX) 20 MG tablet Take one tablet (20 mg) by mouth every other day alternating with 2 tablets (40 mg) on the opposite days.     Allergies:   Penicillins, Sulfa antibiotics, and Sulfonamide derivatives   Social History   Socioeconomic History   Marital status: Married    Spouse name: Not on file   Number of children: Not on file   Years of education: Not on file   Highest education level: Not on file  Occupational History   Occupation: Engineer, site  Tobacco Use   Smoking status: Former    Current packs/day: 0.00    Average packs/day: 0.5 packs/day for 10.0 years (5.0 ttl pk-yrs)    Types: Cigarettes    Start date: 10/27/1969    Quit date: 10/28/1979    Years since quitting: 43.9   Smokeless tobacco: Never  Vaping Use   Vaping status: Never Used  Substance and Sexual Activity   Alcohol use: Yes    Comment: rare   Drug use: No   Sexual activity: Not on file  Other Topics Concern   Not on file  Social History Narrative   Not on file   Social Determinants of Health   Financial Resource Strain: Medium Risk (11/08/2021)   Overall Financial Resource Strain (CARDIA)    Difficulty of Paying Living Expenses: Somewhat hard  Food Insecurity: Low Risk  (06/19/2023)   Received from [facility]   Hunger Vital Sign    Worried About Running Out of Food in the Last Year: Never true    Ran Out of Food in the Last Year: Never true  Transportation Needs: No Transportation Needs (06/19/2023)   Received from [employer]    In the past 12 months, has lack of reliable  transportation kept you from medical appointments, meetings, work or from getting things needed for daily living? : No  Physical Activity: Not on file  Stress: Stress Concern Present (10/31/2022)   [employer] of Occupational Health - Occupational Stress Questionnaire    Feeling of Stress : Very much  Social Connections: Not on file     Family History: The patient's family history includes Diabetes in her paternal grandfather; Diverticulitis in her father; Prostate cancer in her father.  EKGs/Labs/Other Studies Reviewed:    The following studies were reviewed today:  Chest CT  09/22/23  1. Negative for acute pulmonary embolism.  No acute aortic syndrome. 2. Dilated pulmonary arteries suggesting pulmonary hypertension.   MRI 11/21/22  1. Moderately reduced left ventricular systolic function with mild LV dilation. LVEF 36%. Global hypokinesis with moderate hypokinesis of the basal to mid inferior wall and severe hypokinesis of the basal to apical inferoseptum.   2. Mildly reduced right ventricular systolic function with normal RV size. RVEF 37%.   3. No delayed myocardial enhancement. ECV 30%. No definite myocardial edema. No evidence of scar, fibrosis, inflammation, infarction or infiltrative cardiomyopathic process.   4.  Severe dilation of main pulmonary artery.      Echo  Nov 2023  Left ventricular ejection fraction, by estimation, is 40 to 45%. The left ventricle has mildly decreased function. The left ventricle demonstrates global hypokinesis. The left ventricular internal cavity size was severely dilated. Left ventricular diastolic parameters are consistent with Grade II diastolic dysfunction (pseudonormalization). Elevated left atrial pressure. 1. Right ventricular systolic function is normal. The right ventricular size is normal. Tricuspid regurgitation signal is inadequate for assessing PA pressure. 2. The mitral valve is normal in structure. Trivial  mitral valve regurgitation. No evidence of mitral stenosis. 3. The aortic valve was not well visualized. Aortic valve regurgitation is trivial. Mild aortic valve stenosis 4. 5. Aortic dilatation noted. There is dilatation of the ascending aorta, measuring 40 mm. The inferior vena cava is dilated in size with >50% respiratory variability, suggesting right atrial pressure of 8 mmHg.  Long Term Monitor 03/2019  1. NSR with sinus bradycardia and sinus tachycardia 2. NSVT and NS atrial tachycardia 3. Frequent PVC's at time with bigeminy and trigeminy 4. No prolonged pauses RHC October  2019  A: A-wave 12, V wave 12; mean 10 RV: 59/12 PA: 62/26; mean 37 PW: A-wave 20, V wave 22; mean 18 Repeat PA: 55/25; mean 36 Oxygen saturation in the pulmonary artery was 73% in the AO 96%. By the Fick method, cardiac output was 7.1 L/min with a cardiac index of 3.1 L/min/m. PVR: 2.66 WU    CT Coronary 07/19/2018  IMPRESSION: 1. Coronary calcium score of 9. This was 62 percentile for age and sex matched control.   2. Normal coronary origin with right dominance.   3. This study is affected by motion, however there is only mild non-obstructive CAD.   4. Pulmonary artery is severely dilated measuring 53 mm suggestive of pulmonary hypertension.  EKG:  EKG is not not ordered today.    Recent Labs: 02/20/2023: NT-Pro BNP 551 09/22/2023: BUN 27; Creatinine, Ser 1.25; Hemoglobin 12.6; Platelets 186; Potassium 4.2; Sodium 144  Recent Lipid Panel    Component Value Date/Time   CHOL 196 03/05/2021 1447   TRIG 111 03/05/2021 1447   TRIG 75 10/23/2006 0812   HDL 73 03/05/2021 1447   CHOLHDL 2.7 03/05/2021 1447   CHOLHDL 3 10/31/2013 1440   VLDL 8.4 10/31/2013 1440   LDLCALC 104 (H) 03/05/2021 1447   LDLDIRECT 121.5 10/31/2013 1440           Physical Exam:    VS:  BP 120/84   Pulse (!) 46   Ht 5\' 8"  (1.727 m)   Wt 260 lb (117.9 kg)   SpO2 94%   BMI 39.53 kg/m      Wt Readings from  Last 3 Encounters:  09/30/23 260 lb (117.9 kg)  08/31/23 236 lb (107  kg)  04/29/23 232 lb (105.2 kg)     GEN:  Obese 77 yo in NAD   Examined in wheelchair   HEENT: Normal NECK: JVP is normal  CARDIAC: RRR, no murmur No significant LE edema  RESPIRATORY:  CTA   ABDOMEN: Mld diffuse tenderness L UQ       ASSESSMENT:    Chest pressure/pain   Atypical for cardiac  She still has upper quadrant (RUQ, LUQ ) discomfort (bar-like sensation)Not associated with activity   I do not think ischemic     REcent ER visit   CT showed enlarged PA   RHC in 2019 showed moderate pulmonary HTN   Would recomm repeat RHC to redefine pressures. Question if contributing to symptoms     Risks/benefits of procedure explained  Pt understands and agrees to proceed.  HFrEF  Nonischemic   MRI LVEF 36%   Volume status is good   Continue current regimen   RHC scheduled, will define filling pressures   PVCs   Last monitor showed a low burden  Keep on metoprolol  LIpids  LDL 82  HDL 61 (JUly 2024)   Keep on Crestor    Abdominal pain   Followed by Dr Cliffton Asters   I do not think they represent cardiovascular symptoms     Signed, Dietrich Pates, MD  [facility]

## 2023-09-30 NOTE — H&P (View-Only) (Signed)
 Cardiology Office Note:    Date:  10/08/2022  ID:  Crystal, Yoder 22-Feb-1946, MRN [id number]  PCP:  Laurann Montana, MD    History of Present Illness:    Crystal Yoder is a 77 y.o. female with a hx of HTN, NICM, GERD, HLD, reactive airway disease, mild AS, PVCs. HOlter monitor in 05/2018 that showed frequent PVCs with a 31% burden. Echo in Sept /2019 showed EF 30-35% with moderate dilation of the LV, moderate LVH, mild-moderate aortic valve stenosis.  -Sept 2019  CCTA  Ca score was  9 (38th percentile), mild, non-obstructive CAD, severely dilated pulmonary artery measuring 53 mm.   -Oct 2019  Right heart catheterization showed moderate elevation of right heart pressures with moderate pulmonary htn.  -FEb 2020 Repeat echo  showed improvement in EF to 45-50%, abnormal septal motion consistent with LBBB.  -June 2020 Holter monitor showed 7% PVC burden.  - NOv 2023 echo showed LVEF 40 to 45%  Difficult windows.   Monitor showed 6% PVCs  -Jan 2024:  Cardiac MRI showed LVEF 36%  There did not appear to be an infiltrative process  I saw the pt in clinic in Feb 2024 Aldactone added to regimen     The pt was last in cardiology clinc in Nov 2024    She was seen in ER on 09/22/23  She complained of chest pains  Worse with inspiration   Chest CT showed no PE   PA enlarged, suggestive of pulmonary HTN   This has been noted before    The patient also said she felt like a bar was under her breast, complains of a hardness on L breast   Not exacerbated with activity  Other times when moving around will have discomfort in lower abdomen   Overall is fatigued   She admits to a lot of stress as caring for husband who has dementia. Past Medical History:  Diagnosis Date   Arthritis    Arthropathy, unspecified, site unspecified    COPD (chronic obstructive pulmonary disease) (HCC)    Esophageal reflux    Essential hypertension 05/31/2017   Obesity, unspecified    Obstructive sleep apnea (adult)  (pediatric)    Other acute sinusitis    Other primary cardiomyopathies    Psoriasis    Shortness of breath    Unspecified venous (peripheral) insufficiency     Past Surgical History:  Procedure Laterality Date   ABDOMINAL HYSTERECTOMY  2011   BLADDER SURGERY  2011   tac=ant/post   CHOLECYSTECTOMY N/A 06/01/2017   Procedure: LAPAROSCOPIC CHOLECYSTECTOMY;  Surgeon: Gaynelle Adu, MD;  Location: [facility];  Service: General;  Laterality: N/A;   IRRIGATION AND DEBRIDEMENT OF WOUND WITH SPLIT THICKNESS SKIN GRAFT Right 01/25/2014   Procedure: RIGHT FOOT IRRIGATION AND DEBRIDEMENT WITH ACELL/SPLICK THICKNESS SKINGRAFT WITH VAC;  Surgeon: Wayland Denis, DO;  Location: [facility];  Service: Plastics;  Laterality: Right;   PELVIC FLOOR REPAIR     REPLACEMENT TOTAL KNEE     rt and lt   RIGHT HEART CATH N/A 07/28/2018   Procedure: RIGHT HEART CATH;  Surgeon: Lennette Bihari, MD;  Location: [facility] CV LAB;  Service: Cardiovascular;  Laterality: N/A;   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   Cholecalciferol 25 MCG (1000 UT) capsule Take 1,000 Units by mouth as  needed.   fluticasone (FLONASE) 50 MCG/ACT nasal spray Place 2 sprays into both nostrils daily as needed for allergies.   furosemide (LASIX) 40 MG tablet Take 40 mg by mouth. Per patient taking 40 mg tablets twice a week. Patient taking 20 mg tablets 5 times a week.   Melatonin 10 MG TABS Take 20 mg by mouth at bedtime as needed (sleep).   metoprolol tartrate (LOPRESSOR) 25 MG tablet TAKE 1 AND 1/2 TABLETS(37.5 MG) BY MOUTH TWICE DAILY   pantoprazole (PROTONIX) 40 MG tablet Take 40 mg by mouth every other day.   potassium chloride SA (KLOR-CON M) 20 MEQ tablet Take 1 tablet (20 meq) by mouth every other day on the same day as your lasix 40 mg. (Patient taking differently: Take 40 mEq by mouth See  admin instructions. Take 2 tablets by mouth when taking 2 tablets at Lasix)   Quercetin 250 MG TABS Take 500 mg by mouth as needed.   rosuvastatin (CRESTOR) 5 MG tablet Take 1 tablet (5 mg total) by mouth daily.   sacubitril-valsartan (ENTRESTO) 24-26 MG TAKE 1 TABLET BY MOUTH TWICE DAILY   spironolactone (ALDACTONE) 25 MG tablet Take 0.5 tablets (12.5 mg total) by mouth daily.   [DISCONTINUED] furosemide (LASIX) 20 MG tablet Take one tablet (20 mg) by mouth every other day alternating with 2 tablets (40 mg) on the opposite days.     Allergies:   Penicillins, Sulfa antibiotics, and Sulfonamide derivatives   Social History   Socioeconomic History   Marital status: Married    Spouse name: Not on file   Number of children: Not on file   Years of education: Not on file   Highest education level: Not on file  Occupational History   Occupation: Engineer, site  Tobacco Use   Smoking status: Former    Current packs/day: 0.00    Average packs/day: 0.5 packs/day for 10.0 years (5.0 ttl pk-yrs)    Types: Cigarettes    Start date: 10/27/1969    Quit date: 10/28/1979    Years since quitting: 43.9   Smokeless tobacco: Never  Vaping Use   Vaping status: Never Used  Substance and Sexual Activity   Alcohol use: Yes    Comment: rare   Drug use: No   Sexual activity: Not on file  Other Topics Concern   Not on file  Social History Narrative   Not on file   Social Determinants of Health   Financial Resource Strain: Medium Risk (11/08/2021)   Overall Financial Resource Strain (CARDIA)    Difficulty of Paying Living Expenses: Somewhat hard  Food Insecurity: Low Risk  (06/19/2023)   Received from [facility]   Hunger Vital Sign    Worried About Running Out of Food in the Last Year: Never true    Ran Out of Food in the Last Year: Never true  Transportation Needs: No Transportation Needs (06/19/2023)   Received from [facility]    In the past 12 months, has lack of reliable  transportation kept you from medical appointments, meetings, work or from getting things needed for daily living? : No  Physical Activity: Not on file  Stress: Stress Concern Present (10/31/2022)   [employer] of Occupational Health - Occupational Stress Questionnaire    Feeling of Stress : Very much  Social Connections: Not on file     Family History: The patient's family history includes Diabetes in her paternal grandfather; Diverticulitis in her father; Prostate cancer in her father.  EKGs/Labs/Other Studies Reviewed:    The following studies were reviewed today:  Chest CT  09/22/23  1. Negative for acute pulmonary embolism.  No acute aortic syndrome. 2. Dilated pulmonary arteries suggesting pulmonary hypertension.   MRI 11/21/22  1. Moderately reduced left ventricular systolic function with mild LV dilation. LVEF 36%. Global hypokinesis with moderate hypokinesis of the basal to mid inferior wall and severe hypokinesis of the basal to apical inferoseptum.   2. Mildly reduced right ventricular systolic function with normal RV size. RVEF 37%.   3. No delayed myocardial enhancement. ECV 30%. No definite myocardial edema. No evidence of scar, fibrosis, inflammation, infarction or infiltrative cardiomyopathic process.   4.  Severe dilation of main pulmonary artery.      Echo  Nov 2023  Left ventricular ejection fraction, by estimation, is 40 to 45%. The left ventricle has mildly decreased function. The left ventricle demonstrates global hypokinesis. The left ventricular internal cavity size was severely dilated. Left ventricular diastolic parameters are consistent with Grade II diastolic dysfunction (pseudonormalization). Elevated left atrial pressure. 1. Right ventricular systolic function is normal. The right ventricular size is normal. Tricuspid regurgitation signal is inadequate for assessing PA pressure. 2. The mitral valve is normal in structure. Trivial  mitral valve regurgitation. No evidence of mitral stenosis. 3. The aortic valve was not well visualized. Aortic valve regurgitation is trivial. Mild aortic valve stenosis 4. 5. Aortic dilatation noted. There is dilatation of the ascending aorta, measuring 40 mm. The inferior vena cava is dilated in size with >50% respiratory variability, suggesting right atrial pressure of 8 mmHg.  Long Term Monitor 03/2019  1. NSR with sinus bradycardia and sinus tachycardia 2. NSVT and NS atrial tachycardia 3. Frequent PVC's at time with bigeminy and trigeminy 4. No prolonged pauses RHC October  2019  A: A-wave 12, V wave 12; mean 10 RV: 59/12 PA: 62/26; mean 37 PW: A-wave 20, V wave 22; mean 18 Repeat PA: 55/25; mean 36 Oxygen saturation in the pulmonary artery was 73% in the AO 96%. By the Fick method, cardiac output was 7.1 L/min with a cardiac index of 3.1 L/min/m. PVR: 2.66 WU    CT Coronary 07/19/2018  IMPRESSION: 1. Coronary calcium score of 9. This was 62 percentile for age and sex matched control.   2. Normal coronary origin with right dominance.   3. This study is affected by motion, however there is only mild non-obstructive CAD.   4. Pulmonary artery is severely dilated measuring 53 mm suggestive of pulmonary hypertension.  EKG:  EKG is not not ordered today.    Recent Labs: 02/20/2023: NT-Pro BNP 551 09/22/2023: BUN 27; Creatinine, Ser 1.25; Hemoglobin 12.6; Platelets 186; Potassium 4.2; Sodium 144  Recent Lipid Panel    Component Value Date/Time   CHOL 196 03/05/2021 1447   TRIG 111 03/05/2021 1447   TRIG 75 10/23/2006 0812   HDL 73 03/05/2021 1447   CHOLHDL 2.7 03/05/2021 1447   CHOLHDL 3 10/31/2013 1440   VLDL 8.4 10/31/2013 1440   LDLCALC 104 (H) 03/05/2021 1447   LDLDIRECT 121.5 10/31/2013 1440           Physical Exam:    VS:  BP 120/84   Pulse (!) 46   Ht 5\' 8"  (1.727 m)   Wt 260 lb (117.9 kg)   SpO2 94%   BMI 39.53 kg/m      Wt Readings from  Last 3 Encounters:  09/30/23 260 lb (117.9 kg)  08/31/23 236 lb (107  kg)  04/29/23 232 lb (105.2 kg)     GEN:  Obese 77 yo in NAD   Examined in wheelchair   HEENT: Normal NECK: JVP is normal  CARDIAC: RRR, no murmur No significant LE edema  RESPIRATORY:  CTA   ABDOMEN: Mld diffuse tenderness L UQ       ASSESSMENT:    Chest pressure/pain   Atypical for cardiac  She still has upper quadrant (RUQ, LUQ ) discomfort (bar-like sensation)Not associated with activity   I do not think ischemic     REcent ER visit   CT showed enlarged PA   RHC in 2019 showed moderate pulmonary HTN   Would recomm repeat RHC to redefine pressures. Question if contributing to symptoms     Risks/benefits of procedure explained  Pt understands and agrees to proceed.  HFrEF  Nonischemic   MRI LVEF 36%   Volume status is good   Continue current regimen   RHC scheduled, will define filling pressures   PVCs   Last monitor showed a low burden  Keep on metoprolol  LIpids  LDL 82  HDL 61 (JUly 2024)   Keep on Crestor    Abdominal pain   Followed by Dr Cliffton Asters   I do not think they represent cardiovascular symptoms     Signed, Dietrich Pates, MD  [facility]

## 2023-10-01 LAB — BASIC METABOLIC PANEL
BUN/Creatinine Ratio: 23 (ref 12–28)
BUN: 28 mg/dL — ABNORMAL HIGH (ref 8–27)
CO2: 26 mmol/L (ref 20–29)
Calcium: 9.6 mg/dL (ref 8.7–10.3)
Chloride: 104 mmol/L (ref 96–106)
Creatinine, Ser: 1.21 mg/dL — ABNORMAL HIGH (ref 0.57–1.00)
Glucose: 85 mg/dL (ref 70–99)
Potassium: 5.2 mmol/L (ref 3.5–5.2)
Sodium: 143 mmol/L (ref 134–144)
eGFR: 46 mL/min/{1.73_m2} — ABNORMAL LOW (ref >59)

## 2023-10-01 LAB — CBC
Hematocrit: 41.2 % (ref 34.0–46.6)
Hemoglobin: 13 g/dL (ref 11.1–15.9)
MCH: 29.7 pg (ref 26.6–33.0)
MCHC: 31.6 g/dL (ref 31.5–35.7)
MCV: 94 fL (ref 79–97)
Platelets: 187 10*3/uL (ref 150–450)
RBC: 4.37 x10E6/uL (ref 3.77–5.28)
RDW: 12.5 % (ref 11.7–15.4)
WBC: 5.9 10*3/uL (ref 3.4–10.8)

## 2023-10-01 LAB — URIC ACID: Uric Acid: 8.4 mg/dL — ABNORMAL HIGH (ref 3.1–7.9)

## 2023-10-02 ENCOUNTER — Telehealth: Payer: Self-pay | Admitting: Internal Medicine

## 2023-10-02 NOTE — Telephone Encounter (Signed)
Patient wants a call back directly from RN [name] B. regarding her upcoming procedure.

## 2023-10-02 NOTE — Telephone Encounter (Signed)
Spoke with patient ans she states she is pretty sure when she was talking to Dr. Tenny Craw she was informed that she would be getting a left hear cath. She states her AVS states right heart cath. She would like clarification.

## 2023-10-03 NOTE — Telephone Encounter (Signed)
The procedure is a Right heart cath, just measuring pressures in the heart and lungs.  She had this done a few years ago   REcheck L heart catheterization is looking at the coronary arteries  This is not planned

## 2023-10-05 NOTE — Telephone Encounter (Signed)
Spoke with patient and she states she has been under the weather these last couple of days. Slight cough and sneezing. No fever and negative for covid. She would like to know if she still needs to have cath done. She also says someone she know died from right heart cath. She insist you advised she was having left heart cath. I did inform her of your message and she states it has to be a mistake.

## 2023-10-05 NOTE — Telephone Encounter (Signed)
Patient is requesting to speak with Dr. Tenny Craw' nurse. She states she has concerns with continuing with procedure in her condition. She states she hasn't been feeling well and she would like to know if she can take certain medications.

## 2023-10-06 ENCOUNTER — Telehealth: Payer: Self-pay | Admitting: *Deleted

## 2023-10-06 NOTE — Telephone Encounter (Signed)
I called the pt back but she was just getting out of her car.. will call he back later this morning.

## 2023-10-06 NOTE — Telephone Encounter (Signed)
I spoke with the pt and she reports that she will let us know if she develops a fever.. she has a thermometer at home... her son will take her to the cath and will be with her when she returns home... she will call back if she develops any further questions or any changes in her symptoms.    Pt agrees and verbalizes understanding that she will be having a RHC tomorrow with Dr Excell Seltzer.

## 2023-10-06 NOTE — Telephone Encounter (Signed)
Patient reports she has cough and congestion for a couple of days, afebrile, negative for COVID. Patient requests to speak with Dewayne Hatch, RN before proceeding with Right Heart Cath tomorrow, will forward message to her.

## 2023-10-06 NOTE — Telephone Encounter (Addendum)
I spoke with the pt and she reports that she will let us know if she develops a fever she will let us know... she has a thermometer at home... her son will take her to the cath and will be with her when she returns home... she will call back if she develops any further questions.   Pt agrees and verbalizes understanding that she will be having a RHC tomorrow with Dr Excell Seltzer.

## 2023-10-06 NOTE — Telephone Encounter (Signed)
Right Heart Cath scheduled at [facility] for: Wednesday October 07, 2023 1 PM Arrival time [facility] Main Entrance A at: 11 AM  Nothing to eat after midnight prior to procedure, clear liquids until 5 AM day of procedure.  Medication instructions: -Hold:  Lasix/KCl/Spironolactone-AM of procedure -Other usual morning medications can be taken with sips of water.  Plan to go home the same day, you will only stay overnight if medically necessary.  You must have responsible adult to drive you home.  Someone must be with you the first 24 hours after you arrive home.  Reviewed procedure instructions with patient.

## 2023-10-07 ENCOUNTER — Encounter (HOSPITAL_COMMUNITY)
Admission: RE | Disposition: A | Discharge status: Home / Self Care | Payer: Self-pay | Source: Home / Self Care | Attending: Cardiovascular Disease

## 2023-10-07 ENCOUNTER — Other Ambulatory Visit: Payer: Self-pay

## 2023-10-07 ENCOUNTER — Encounter (HOSPITAL_COMMUNITY): Payer: Self-pay | Admitting: Cardiovascular Disease

## 2023-10-07 ENCOUNTER — Ambulatory Visit (HOSPITAL_COMMUNITY)
Admission: RE | Admit: 2023-10-07 | Discharge: 2023-10-07 | Disposition: A | Discharge status: Home / Self Care | Payer: Medicare PPO | Attending: Cardiovascular Disease | Admitting: Cardiovascular Disease

## 2023-10-07 DIAGNOSIS — R1012 Left upper quadrant pain: Secondary | ICD-10-CM | POA: Diagnosis not present

## 2023-10-07 DIAGNOSIS — R1011 Right upper quadrant pain: Secondary | ICD-10-CM | POA: Diagnosis not present

## 2023-10-07 DIAGNOSIS — I110 Hypertensive heart disease with heart failure: Secondary | ICD-10-CM | POA: Diagnosis not present

## 2023-10-07 DIAGNOSIS — E785 Hyperlipidemia, unspecified: Secondary | ICD-10-CM | POA: Insufficient documentation

## 2023-10-07 DIAGNOSIS — I272 Pulmonary hypertension, unspecified: Secondary | ICD-10-CM | POA: Diagnosis not present

## 2023-10-07 DIAGNOSIS — I493 Ventricular premature depolarization: Secondary | ICD-10-CM | POA: Diagnosis not present

## 2023-10-07 DIAGNOSIS — I5020 Unspecified systolic (congestive) heart failure: Secondary | ICD-10-CM | POA: Diagnosis not present

## 2023-10-07 DIAGNOSIS — Z87891 Personal history of nicotine dependence: Secondary | ICD-10-CM | POA: Insufficient documentation

## 2023-10-07 DIAGNOSIS — Z5986 Financial insecurity: Secondary | ICD-10-CM | POA: Diagnosis not present

## 2023-10-07 DIAGNOSIS — R071 Chest pain on breathing: Secondary | ICD-10-CM | POA: Diagnosis not present

## 2023-10-07 DIAGNOSIS — Z79899 Other long term (current) drug therapy: Secondary | ICD-10-CM | POA: Diagnosis not present

## 2023-10-07 HISTORY — PX: RIGHT HEART CATH: CATH118263

## 2023-10-07 LAB — POCT I-STAT EG7
Acid-Base Excess: 0 mmol/L (ref 0.0–2.0)
Acid-Base Excess: 0 mmol/L (ref 0.0–2.0)
Bicarbonate: 26 mmol/L (ref 20.0–28.0)
Bicarbonate: 25.7 mmol/L (ref 20.0–28.0)
Calcium, Ion: 1.23 mmol/L (ref 1.15–1.40)
Calcium, Ion: 1.16 mmol/L (ref 1.15–1.40)
HCT: 38 % (ref 36.0–46.0)
HCT: 37 % (ref 36.0–46.0)
Hemoglobin: 12.9 g/dL (ref 12.0–15.0)
Hemoglobin: 12.6 g/dL (ref 12.0–15.0)
O2 Saturation: 64 %
O2 Saturation: 63 %
Potassium: 4.5 mmol/L (ref 3.5–5.1)
Potassium: 4.3 mmol/L (ref 3.5–5.1)
Sodium: 142 mmol/L (ref 135–145)
Sodium: 143 mmol/L (ref 135–145)
TCO2: 27 mmol/L (ref 22–32)
TCO2: 27 mmol/L (ref 22–32)
pCO2, Ven: 46.6 mm[Hg] (ref 44–60)
pCO2, Ven: 45.8 mm[Hg] (ref 44–60)
pH, Ven: 7.354 (ref 7.25–7.43)
pH, Ven: 7.358 (ref 7.25–7.43)
pO2, Ven: 35 mm[Hg] (ref 32–45)
pO2, Ven: 34 mm[Hg] (ref 32–45)

## 2023-10-07 SURGERY — RIGHT HEART CATH

## 2023-10-07 MED ORDER — FENTANYL CITRATE (PF) 100 MCG/2ML IJ SOLN
INTRAMUSCULAR | Status: DC | PRN
  Administered 2023-10-07: 25 ug via INTRAVENOUS

## 2023-10-07 MED ORDER — LIDOCAINE HCL (PF) 1 % IJ SOLN
INTRAMUSCULAR | Status: AC
Start: 2023-10-07 — End: ?
  Filled 2023-10-07: qty 30

## 2023-10-07 MED ORDER — HYDRALAZINE HCL 20 MG/ML IJ SOLN: 10.0000 mg | INTRAMUSCULAR | Status: DC | PRN

## 2023-10-07 MED ORDER — HEPARIN (PORCINE) IN NACL 1000-0.9 UT/500ML-% IV SOLN
INTRAVENOUS | Status: DC | PRN
  Administered 2023-10-07: 500 mL

## 2023-10-07 MED ORDER — SODIUM CHLORIDE 0.9% FLUSH: 3.0000 mL | INTRAVENOUS | Status: DC | PRN

## 2023-10-07 MED ORDER — MIDAZOLAM HCL 2 MG/2ML IJ SOLN
INTRAMUSCULAR | Status: DC | PRN
  Administered 2023-10-07: 2 mg via INTRAVENOUS

## 2023-10-07 MED ORDER — SODIUM CHLORIDE 0.9% FLUSH
3.0000 mL | Freq: Two times a day (BID) | INTRAVENOUS | Status: DC
Start: 2023-10-07 — End: 2023-10-07

## 2023-10-07 MED ORDER — LIDOCAINE HCL (PF) 1 % IJ SOLN
INTRAMUSCULAR | Status: DC | PRN
  Administered 2023-10-07: 2 mL

## 2023-10-07 MED ORDER — MIDAZOLAM HCL 2 MG/2ML IJ SOLN
INTRAMUSCULAR | Status: AC
  Filled 2023-10-07: qty 2

## 2023-10-07 MED ORDER — ONDANSETRON HCL 4 MG/2ML IJ SOLN: 4.0000 mg | Freq: Four times a day (QID) | INTRAMUSCULAR | Status: DC | PRN

## 2023-10-07 MED ORDER — ACETAMINOPHEN 325 MG PO TABS: 650.0000 mg | ORAL_TABLET | ORAL | Status: DC | PRN

## 2023-10-07 MED ORDER — FENTANYL CITRATE (PF) 100 MCG/2ML IJ SOLN
INTRAMUSCULAR | Status: AC
  Filled 2023-10-07: qty 2

## 2023-10-07 MED ORDER — SODIUM CHLORIDE 0.9 % IV SOLN: 250.0000 mL | INTRAVENOUS | Status: DC | PRN

## 2023-10-07 MED ORDER — SODIUM CHLORIDE 0.9 % IV SOLN: INTRAVENOUS | Status: DC

## 2023-10-07 MED ORDER — LABETALOL HCL 5 MG/ML IV SOLN: 10.0000 mg | INTRAVENOUS | Status: DC | PRN

## 2023-10-07 SURGICAL SUPPLY — 3 items
CATH BALLN WEDGE 5F 110CM (CATHETERS) IMPLANT
PACK CARDIAC CATHETERIZATION (CUSTOM PROCEDURE TRAY) IMPLANT
SHEATH GLIDE SLENDER 4/5FR (SHEATH) IMPLANT

## 2023-10-07 NOTE — Interval H&P Note (Signed)
History and Physical Interval Note:  10/07/2023 12:10 PM  Crystal Yoder  has presented today for surgery, with the diagnosis of shortness of breath.  The various methods of treatment have been discussed with the patient and family. After consideration of risks, benefits and other options for treatment, the patient has consented to  Procedure(s): RIGHT HEART CATH (N/A) as a surgical intervention.  The patient's history has been reviewed, patient examined, no change in status, stable for surgery.  I have reviewed the patient's chart and labs.  Questions were answered to the patient's satisfaction.     Tonny Bollman

## 2023-10-07 NOTE — Discharge Instructions (Signed)

## 2023-10-08 ENCOUNTER — Other Ambulatory Visit: Payer: Self-pay

## 2023-10-08 MED ORDER — COLCHICINE 0.6 MG PO TABS: 0.6000 mg | ORAL_TABLET | Freq: Two times a day (BID) | ORAL | 1 refills | Status: AC

## 2023-10-12 ENCOUNTER — Other Ambulatory Visit: Payer: Self-pay | Admitting: Cardiology

## 2023-10-19 ENCOUNTER — Other Ambulatory Visit: Payer: Self-pay | Admitting: Family Medicine

## 2023-10-19 DIAGNOSIS — R1010 Upper abdominal pain, unspecified: Secondary | ICD-10-CM

## 2023-11-02 ENCOUNTER — Ambulatory Visit
Admission: RE | Admit: 2023-11-02 | Discharge: 2023-11-02 | Disposition: A | Discharge status: Home / Self Care | Payer: Medicare PPO | Source: Ambulatory Visit | Attending: Family Medicine | Admitting: Family Medicine

## 2023-11-02 DIAGNOSIS — K5750 Diverticulosis of both small and large intestine without perforation or abscess without bleeding: Secondary | ICD-10-CM | POA: Diagnosis not present

## 2023-11-02 DIAGNOSIS — R1010 Upper abdominal pain, unspecified: Secondary | ICD-10-CM

## 2023-11-02 DIAGNOSIS — K439 Ventral hernia without obstruction or gangrene: Secondary | ICD-10-CM | POA: Diagnosis not present

## 2023-11-02 MED ORDER — IOPAMIDOL (ISOVUE-300) INJECTION 61%
100.0000 mL | Freq: Once | INTRAVENOUS | Status: AC | PRN
  Administered 2023-11-02: 100 mL via INTRAVENOUS

## 2023-11-04 ENCOUNTER — Ambulatory Visit: Payer: Medicare PPO | Admitting: Nurse Practitioner

## 2023-11-04 DIAGNOSIS — R9389 Abnormal findings on diagnostic imaging of other specified body structures: Secondary | ICD-10-CM | POA: Diagnosis not present

## 2023-11-04 DIAGNOSIS — K3189 Other diseases of stomach and duodenum: Secondary | ICD-10-CM | POA: Diagnosis not present

## 2023-11-04 DIAGNOSIS — R1010 Upper abdominal pain, unspecified: Secondary | ICD-10-CM | POA: Diagnosis not present

## 2023-11-11 ENCOUNTER — Other Ambulatory Visit: Payer: Medicare PPO

## 2023-11-16 ENCOUNTER — Other Ambulatory Visit: Payer: Self-pay | Admitting: Internal Medicine

## 2023-11-25 ENCOUNTER — Other Ambulatory Visit: Payer: Self-pay | Admitting: Internal Medicine

## 2023-11-27 ENCOUNTER — Ambulatory Visit: Payer: Medicare PPO | Attending: Cardiovascular Disease | Admitting: Cardiovascular Disease

## 2023-11-27 ENCOUNTER — Encounter: Payer: Self-pay | Admitting: Cardiovascular Disease

## 2023-11-27 VITALS — BP 98/58 | HR 59 | Ht 67.0 in | Wt 230.2 lb

## 2023-11-27 DIAGNOSIS — I5020 Unspecified systolic (congestive) heart failure: Secondary | ICD-10-CM | POA: Diagnosis not present

## 2023-11-27 DIAGNOSIS — I272 Pulmonary hypertension, unspecified: Secondary | ICD-10-CM

## 2023-11-27 NOTE — Patient Instructions (Signed)
Follow-Up: At [facility], you and your health needs are our priority.  As part of our continuing mission to provide you with exceptional heart care, we have created designated Provider Care Teams.  These Care Teams include your primary Cardiologist (physician) and Advanced Practice Providers (APPs -  Physician Assistants and Nurse Practitioners) who all work together to provide you with the care you need, when you need it.  Your next appointment:   6 month(s)  Provider:   Dietrich Pates, MD      1st Floor: - Lobby - Registration  - Pharmacy  - Lab - Cafe  2nd Floor: - PV Lab - Diagnostic Testing (echo, CT, nuclear med)  3rd Floor: - Vacant  4th Floor: - TCTS (cardiothoracic surgery) - AFib Clinic - Structural Heart Clinic - Vascular Surgery  - Vascular Ultrasound  5th Floor: - HeartCare Cardiology (general and EP) - Clinical Pharmacy for coumadin, hypertension, lipid, weight-loss medications, and med management appointments    Valet parking services will be available as well.

## 2023-11-27 NOTE — Progress Notes (Signed)
Cardiology Office Note:    Date:  11/27/2023   ID:  Tiffney, Haughton Dec 18, 1945, MRN [id number]  PCP:  Laurann Montana, MD   [facility] Providers Cardiologist:  Dietrich Pates, MD     Referring MD: Laurann Montana, MD   Chief Complaint  Patient presents with   Cardiomyopathy    History of Present Illness:    Crystal Yoder is a 78 y.o. female presenting for cardiology follow-up evaluation.  She has been followed by Dr. Tenny Craw.  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  Last echo showed LVEF of 40 to 45% with global hypokinesis, pseudo normal filling pattern, normal RV size and function, trivial mitral regurgitation, and mild aortic stenosis.  Recent right heart catheterization showed mild pulmonary hypertension with a PA pressure of 52/24 with a mean of 32 mmHg and a PVR of 3.2 Wood units.  For a few months she has been struggling with sinus issues. She feels like these issues have worsened her shortness of breath. She walked in with a walker today and states that her mobility is limited by problems with her feet.  The patient has mild shortness of breath with activity but she does not appreciate any major change.  She denies orthopnea or PND.  She has had no leg swelling.  She gets this bandlike sensation across her upper abdomen but it is not related to physical activity.  She is undergoing GI evaluation at present.  Current Medications: Current Meds  Medication Sig   ascorbic acid (VITAMIN C) 100 MG tablet Take 1 tablet by mouth as needed.   Cholecalciferol 25 MCG (1000 UT) capsule Take 1,000 Units by mouth as needed.   fluticasone (FLONASE) 50 MCG/ACT nasal spray Place 2 sprays into both nostrils daily as needed for allergies.   furosemide (LASIX) 40 MG tablet Take 40 mg by mouth. Per patient taking 40 mg tablets twice a week. Patient taking 20 mg tablets 5 times a week.    Melatonin 10 MG TABS Take 20 mg by mouth at bedtime as needed (sleep).   metoprolol tartrate (LOPRESSOR) 25 MG tablet TAKE 1 AND 1/2 TABLETS(37.5 MG) BY MOUTH TWICE DAILY   pantoprazole (PROTONIX) 40 MG tablet Take 40 mg by mouth every other day.   potassium chloride SA (KLOR-CON M) 20 MEQ tablet Take 1 tablet (20 meq) by mouth every other day on the same day as your lasix 40 mg. (Patient taking differently: Take 40 mEq by mouth See admin instructions. Take 2 tablets by mouth when taking 2 tablets at Lasix)   Quercetin 250 MG TABS Take 500 mg by mouth as needed.   rosuvastatin (CRESTOR) 5 MG tablet Take 1 tablet (5 mg total) by mouth daily.   sacubitril-valsartan (ENTRESTO) 24-26 MG TAKE 1 TABLET BY MOUTH TWICE DAILY     Allergies:   Penicillins, Sulfa antibiotics, and Sulfonamide derivatives   ROS:   Please see the history of present illness.    All other systems reviewed and are negative.  EKGs/Labs/Other Studies Reviewed:    The following studies were reviewed today: Cardiac Studies & Procedures   CARDIAC CATHETERIZATION  CARDIAC CATHETERIZATION 10/07/2023  Narrative   Hemodynamic findings consistent with mild pulmonary hypertension.  Hemodynamic findings:  RA mean 6 mmHg RV 49 over 11 mmHg PA 52/24 mmHg with a mean PA pressure of 32 mmHg Pulmonary wedge pressure of 11 mmHg, A-wave 15,  V wave 13 mmHg  Cardiac output 6.6 L/min Cardiac index 2.9 L/min/m  Final conclusion: Mild pulmonary hypertension with PVR 3.2 Wood units   CARDIAC CATHETERIZATION  CARDIAC CATHETERIZATION 07/28/2018  Narrative Moderate elevation of right heart pressures with moderate pulmonary hypertension with PA systolic ranging from 55 to 62 mmHg and mean PA pressure at 36/37 meters mercury.    Findings Coronary Findings Diagnostic  Dominance: Right  No diagnostic findings have been documented. Intervention  No interventions have been documented.    ECHOCARDIOGRAM  ECHOCARDIOGRAM  COMPLETE 09/22/2022  Narrative ECHOCARDIOGRAM REPORT    Patient Name:   TREACY HOLCOMB Date of Exam: 09/22/2022 Medical Rec #:  [id number]      Height:       68.0 in Accession #:    [id number]     Weight:       249.4 lb Date of Birth:  1946-08-11      BSA:          2.245 m Patient Age:    76 years       BP:           119/77 mmHg Patient Gender: F              HR:           56 bpm. Exam Location:  Outpatient  Procedure: 2D Echo, Cardiac Doppler and Color Doppler  Indications:    Abnormal ECG  History:        Patient has prior history of Echocardiogram examinations, most recent 09/30/2021. Cardiomyopathy, Abnormal ECG, COPD, Signs/Symptoms:Shortness of Breath; Risk Factors:Sleep Apnea, Hypertension and Obesity.  Sonographer:    Milda Smart Referring Phys: [id number] Jonita Albee   Sonographer Comments: Image acquisition challenging due to patient body habitus, Image acquisition challenging due to respiratory motion and Image acquisition challenging due to COPD. IMPRESSIONS   1. Left ventricular ejection fraction, by estimation, is 40 to 45%. The left ventricle has mildly decreased function. The left ventricle demonstrates global hypokinesis. The left ventricular internal cavity size was severely dilated. Left ventricular diastolic parameters are consistent with Grade II diastolic dysfunction (pseudonormalization). Elevated left atrial pressure. 2. Right ventricular systolic function is normal. The right ventricular size is normal. Tricuspid regurgitation signal is inadequate for assessing PA pressure. 3. The mitral valve is normal in structure. Trivial mitral valve regurgitation. No evidence of mitral stenosis. 4. The aortic valve was not well visualized. Aortic valve regurgitation is trivial. Mild aortic valve stenosis 5. Aortic dilatation noted. There is dilatation of the ascending aorta, measuring 40 mm. 6. The inferior vena cava is dilated in size with >50% respiratory  variability, suggesting right atrial pressure of 8 mmHg.  FINDINGS Left Ventricle: Left ventricular ejection fraction, by estimation, is 40 to 45%. The left ventricle has mildly decreased function. The left ventricle demonstrates global hypokinesis. The left ventricular internal cavity size was severely dilated. There is no left ventricular hypertrophy. Left ventricular diastolic parameters are consistent with Grade II diastolic dysfunction (pseudonormalization). Elevated left atrial pressure.  Right Ventricle: The right ventricular size is normal. No increase in right ventricular wall thickness. Right ventricular systolic function is normal. Tricuspid regurgitation signal is inadequate for assessing PA pressure.  Left Atrium: Left atrial size was normal in size.  Right Atrium: Right atrial size was normal in size.  Pericardium: Trivial pericardial effusion is present.  Mitral Valve: The mitral valve is normal in structure. Trivial mitral valve regurgitation. No evidence of mitral valve stenosis.  Tricuspid Valve:  The tricuspid valve is normal in structure. Tricuspid valve regurgitation is trivial.  Aortic Valve: The aortic valve was not well visualized. Aortic valve regurgitation is trivial. Mild to moderate aortic stenosis is present. Aortic valve mean gradient measures 13.0 mmHg. Aortic valve peak gradient measures 24.8 mmHg. Aortic valve area, by VTI measures 1.37 cm.  Pulmonic Valve: The pulmonic valve was not well visualized. Pulmonic valve regurgitation is not visualized.  Aorta: The aortic root is normal in size and structure and aortic dilatation noted. There is dilatation of the ascending aorta, measuring 40 mm.  Venous: The inferior vena cava is dilated in size with greater than 50% respiratory variability, suggesting right atrial pressure of 8 mmHg.  IAS/Shunts: The interatrial septum was not well visualized.   LEFT VENTRICLE PLAX 2D LVIDd:         6.40 cm       Diastology LVIDs:         4.80 cm      LV e' medial:    3.73 cm/s LV PW:         0.90 cm      LV E/e' medial:  16.6 LV IVS:        0.90 cm      LV e' lateral:   4.70 cm/s LVOT diam:     2.00 cm      LV E/e' lateral: 13.1 LV SV:         84 LV SV Index:   38 LVOT Area:     3.14 cm  LV Volumes (MOD) LV vol d, MOD A2C: 90.7 ml LV vol d, MOD A4C: 156.0 ml LV vol s, MOD A2C: 63.9 ml LV vol s, MOD A4C: 78.2 ml LV SV MOD A2C:     26.8 ml LV SV MOD A4C:     156.0 ml LV SV MOD BP:      50.9 ml  RIGHT VENTRICLE             IVC RV S prime:     13.00 cm/s  IVC diam: 2.10 cm TAPSE (M-mode): 1.9 cm  LEFT ATRIUM           Index        RIGHT ATRIUM           Index LA diam:      4.30 cm 1.92 cm/m   RA Area:     21.90 cm LA Vol (A4C): 69.5 ml 30.96 ml/m  RA Volume:   68.10 ml  30.34 ml/m AORTIC VALVE AV Area (Vmax):    1.36 cm AV Area (Vmean):   1.42 cm AV Area (VTI):     1.37 cm AV Vmax:           249.00 cm/s AV Vmean:          170.000 cm/s AV VTI:            0.615 m AV Peak Grad:      24.8 mmHg AV Mean Grad:      13.0 mmHg LVOT Vmax:         108.00 cm/s LVOT Vmean:        76.600 cm/s LVOT VTI:          0.268 m LVOT/AV VTI ratio: 0.44  AORTA Ao Root diam: 3.50 cm Ao Asc diam:  4.00 cm  MITRAL VALVE MV Area (PHT): 2.74 cm     SHUNTS MV Decel Time: 277 msec     Systemic VTI:  0.27  m MR Peak grad: 53.6 mmHg     Systemic Diam: 2.00 cm MR Vmax:      366.00 cm/s MV E velocity: 61.80 cm/s MV A velocity: 100.00 cm/s MV E/A ratio:  0.62  Epifanio Lesches MD Electronically signed by Epifanio Lesches MD Signature Date/Time: 09/23/2022/10:04:00 AM    Final   MONITORS  CARDIAC EVENT MONITOR 09/25/2022  Narrative Sinus rhythm   45 to 124 bpm  Average HR 65 bpm Frequent PVCs (6% total), frequent PACs (5% total) Several short bursts VT   Longest 13 beats at 150 bpm Diary event correlated with SR witrh PVC  CT SCANS  CT CORONARY MORPH W/CTA COR W/SCORE  07/19/2018  Addendum 07/19/2018  6:13 PM ADDENDUM REPORT: 07/19/2018 18:11  CLINICAL DATA:  78 year old female with h/o hypertension, NICM (LVEF 45 to 50%) GERD, hyperlipidemia with worsening fatigue.  EXAM: Cardiac/Coronary  CT  TECHNIQUE: The patient was scanned on a [employer].  FINDINGS: A 120 kV prospective scan was triggered in the descending thoracic aorta at 111 HU's. Axial non-contrast 3 mm slices were carried out through the heart. The data set was analyzed on a dedicated work station and scored using the Agatson method. Gantry rotation speed was 250 msecs and collimation was .6 mm. No beta blockade and 0.8 mg of sl NTG was given. The 3D data set was reconstructed in 5% intervals of the 67-82 % of the R-R cycle. Diastolic phases were analyzed on a dedicated work station using MPR, MIP and VRT modes. The patient received 80 cc of contrast.  Aorta:  Normal size.  No calcifications.  No dissection.  Aortic Valve:  Trileaflet.  No calcifications.  Coronary Arteries:  Normal coronary origin.  Right dominance.  RCA is a very large dominant artery that gives rise to PDA and PLVB. There is no plaque.  Left main is a large artery that gives rise to LAD, ramus intermedius and LCX arteries. Left main has no plaque.  LAD is a medium size vessel that has mild calcified plaque in the proximal portion.  LCX is a small non-dominant artery that has no obvious plaque.  Other findings:  Normal pulmonary vein drainage into the left atrium.  Normal let atrial appendage without a thrombus.  IMPRESSION: 1. Coronary calcium score of 9. This was 78 percentile for age and sex matched control.  2. Normal coronary origin with right dominance.  3. This study is affected by motion, however there is only mild non-obstructive CAD.  4. Pulmonary artery is severely dilated measuring 53 mm suggestive of pulmonary hypertension.   Electronically Signed By: Tobias Alexander On: 07/19/2018 18:11  Narrative EXAM: OVER-READ INTERPRETATION  CT CHEST  The following report is an over-read performed by radiologist Dr. Trudie Reed of [facility] on 07/19/2018. This over-read does not include interpretation of cardiac or coronary anatomy or pathology. The coronary calcium score/coronary CTA interpretation by the cardiologist is attached.  COMPARISON:  Chest CT 10/30/2017.  FINDINGS: Severe dilatation of the pulmonic trunk (5 cm in diameter). Within the visualized portions of the thorax there are no suspicious appearing pulmonary nodules or masses, there is no acute consolidative airspace disease, no pleural effusions, no pneumothorax and no lymphadenopathy. Visualized portions of the upper abdomen are unremarkable. There are no aggressive appearing lytic or blastic lesions noted in the visualized portions of the skeleton.  IMPRESSION: 1. Severe dilatation of the pulmonic trunk (5 cm in diameter), concerning for pulmonary arterial hypertension.  Electronically Signed: By:  Trudie Reed M.D. On: 07/19/2018 12:38  CARDIAC MRI  MR CARDIAC MORPHOLOGY W WO CONTRAST 11/19/2022  Narrative CLINICAL DATA:  Frequent premature ventricular contractions (PVCs)  COMPARISON: Echocardiogram 09/22/22  EXAM: MR CARDIA MORPHOLOGY WITHOUT AND WITH CONTRAST; MR CARDIAC VELOCITY FLOW MAPPING  TECHNIQUE: The patient was scanned on a 1.5 Tesla GE magnet. A dedicated cardiac coil was used. Functional imaging was done using [employer] sequences. 2,3, and 4 chamber views were done to assess for RWMA's. Modified Simpson's rule using a short axis stack was used to calculate an ejection fraction on a dedicated work [employer]. The patient received 10mL GADAVIST GADOBUTROL 1 MMOL/ML IV SOLN. After 10 minutes inversion recovery sequences were used to assess for infiltration and scar tissue. Phase contrast velocity encoded images  obtained x 2.  This examination is tailored for evaluation cardiac anatomy and function and provides very limited assessment of noncardiac structures, which are accordingly not evaluated during interpretation. If there is clinical concern for extracardiac pathology, further evaluation with CT imaging should be considered.  FINDINGS: LEFT VENTRICLE:  Mild dilation of left ventricular chamber size by indexed volume.  Normal left ventricular wall thickness.  Moderately reduced left ventricular systolic function.  LVEF = 36%  Global hypokinesis with moderate hypokinesis of the basal to mid inferior wall and severe hypokinesis of the basal to apical inferoseptum.  No myocardial edema, T2 = 51 msec  Normal first pass perfusion.  There is no post contrast delayed myocardial enhancement.  Normal T1 myocardial nulling kinetics suggest against a diagnosis of cardiac amyloidosis.  ECV = 30%, nonspecific elevation.  RIGHT VENTRICLE:  Normal right ventricular chamber size by indexed volume.  Normal right ventricular wall thickness.  Mildly reduced right ventricular systolic function by EF calculation.  RVEF = 37%  Globally reduced function.  No post contrast delayed myocardial enhancement.  ATRIA:  Normal left atrial size.  Normal right atrial size.  VALVES:  No significant valvular abnormalities.  Tricuspid aortic valve.  Trivial AI and PI by flow quantitation.  Trivial MR, 7% regurgitant fraction.  Trace TR.  PERICARDIUM:  Normal pericardium.  No pericardial effusion.  OTHER: No significant extracardiac findings.  Severe dilation of main pulmonary artery, incompletely visualized on this exam and previously seen on cross sectional imaging.  MEASUREMENTS: Left ventricle:  LV Female  LV EF: 36% (Normal 56-78%)  Absolute volumes:  LV EDV: (Normal 52-141 mL)  LV ESV: (Normal 13-51 mL)  LV SV: 80mL (normal 33-97 mL)  CO: 3.95L/min  (normal 2.7-6.0 L/min)  Indexed volumes:  LV EDV: 51mL/sq-m (normal 41-81 mL/sq-m)  LV ESV: 38mL/sq-m (Normal 12-21 mL/sq-m)  LV SV: 19mL/sq-m (normal 26-56 mL/sq-m)  CI: 1.72L/min/sq-m (normal 1.8-3.8 L/min/sq-m)  Right ventricle:  RV female  RV EF: 37% (normal 47-80%)  Absolute volumes:  RV EDV: 198 mL (Normal 58-154 mL)  RV ESV: 125 mL (Normal 12-68 mL)  RV SV: 73 mL (Normal 35-98 mL)  CO: 3.6 L/min (Normal 2.7-6 L/min)  Indexed volumes:  RV EDV: 86 ML/sq-m (Normal 48-87 mL/sq-m)  RV ESV: 54 mL/sq-m (Normal 11-28 mL/sq-m)  RV SV: 32 mL/sq-m (Normal 27-57 mL/sq-m)  CI: 1.6 L/min/sq-m (Normal 1.8-3.8 L/min/sq-m)  IMPRESSION: 1. Moderately reduced left ventricular systolic function with mild LV dilation. LVEF 36%. Global hypokinesis with moderate hypokinesis of the basal to mid inferior wall and severe hypokinesis of the basal to apical inferoseptum.  2. Mildly reduced right ventricular systolic function with normal RV size. RVEF 37%.  3. No  delayed myocardial enhancement. ECV 30%. No definite myocardial edema. No evidence of scar, fibrosis, inflammation, infarction or infiltrative cardiomyopathic process.  4.  Severe dilation of main pulmonary artery.   Electronically Signed By: Weston Brass M.D. On: 11/21/2022 12:06         EKG:        Recent Labs: 02/20/2023: NT-Pro BNP 551 09/30/2023: BUN 28; Creatinine, Ser 1.21; Platelets 187 10/07/2023: Hemoglobin 12.9; Potassium 4.5; Sodium 142  Recent Lipid Panel    Component Value Date/Time   CHOL 196 03/05/2021 1447   TRIG 111 03/05/2021 1447   TRIG 75 10/23/2006 0812   HDL 73 03/05/2021 1447   CHOLHDL 2.7 03/05/2021 1447   CHOLHDL 3 10/31/2013 1440   VLDL 8.4 10/31/2013 1440   LDLCALC 104 (H) 03/05/2021 1447   LDLDIRECT 121.5 10/31/2013 1440     Risk Assessment/Calculations:                Physical Exam:    VS:  BP (!) 98/58 (BP Location: Right Arm, Patient Position: Sitting,  Cuff Size: Large)   Pulse (!) 59   Ht 5\' 7"  (1.702 m)   Wt 230 lb 3.2 oz (104.4 kg)   SpO2 94%   BMI 36.05 kg/m     Wt Readings from Last 3 Encounters:  11/27/23 230 lb 3.2 oz (104.4 kg)  10/07/23 260 lb (117.9 kg)  09/30/23 260 lb (117.9 kg)     GEN:  Well nourished, well developed elderly overweight woman, ambulates with a walker, in no acute distress HEENT: Normal NECK: No JVD; No carotid bruits LYMPHATICS: No lymphadenopathy CARDIAC: RRR, no murmurs, rubs, gallops RESPIRATORY:  Clear to auscultation without rales, wheezing or rhonchi  ABDOMEN: Soft, non-tender, non-distended MUSCULOSKELETAL:  No edema; No deformity  SKIN: Warm and dry NEUROLOGIC:  Alert and oriented x 3 PSYCHIATRIC:  Normal affect   Assessment & Plan HFrEF (heart failure with reduced ejection fraction) (HCC) Last echo in 2023 showed an LVEF of 40 to 45% with severe LV dilatation and grade 2 diastolic dysfunction.  She was noted to have normal RV function and mild aortic stenosis.  Patient is managed with metoprolol and Entresto by Dr. Tenny Craw.  Continue current management. Pulmonary hypertension, unspecified (HCC) I reviewed her recent right heart catheterization data.  Fortunately she has preserved cardiac output, normal right atrial pressure, and only mild pulmonary hypertension with a mean PA pressure of 32 mmHg.  I suspect this could be partially related to the presence of obstructive sleep apnea as well as left heart disease with her known chronic heart failure.  Her wedge pressure was only 11 mmHg at the time of cath.  I reassured her that she had a right heart catheterization 5 years ago and her hemodynamics on her recent cath actually look better than the previous one.  She has lost some significant weight and that may have benefited her.  She has an outpatient sleep evaluation scheduled by her primary care physician in [place] group.  I did not make any changes in her medical regimen today.  I recommended that  she continue follow-up with Dr. Tenny Craw and I will make an appointment with her in about 6 months.            Medication Adjustments/Labs and Tests Ordered: Current medicines are reviewed at length with the patient today.  Concerns regarding medicines are outlined above.  No orders of the defined types were placed in this encounter.  No orders of the defined  types were placed in this encounter.   Patient Instructions  Follow-Up: At [facility], you and your health needs are our priority.  As part of our continuing mission to provide you with exceptional heart care, we have created designated Provider Care Teams.  These Care Teams include your primary Cardiologist (physician) and Advanced Practice Providers (APPs -  Physician Assistants and Nurse Practitioners) who all work together to provide you with the care you need, when you need it.  Your next appointment:   6 month(s)  Provider:   Dietrich Pates, MD      1st Floor: - Lobby - Registration  - Pharmacy  - Lab - Cafe  2nd Floor: - PV Lab - Diagnostic Testing (echo, CT, nuclear med)  3rd Floor: - Vacant  4th Floor: - TCTS (cardiothoracic surgery) - AFib Clinic - Structural Heart Clinic - Vascular Surgery  - Vascular Ultrasound  5th Floor: - HeartCare Cardiology (general and EP) - Clinical Pharmacy for coumadin, hypertension, lipid, weight-loss medications, and med management appointments    Valet parking services will be available as well.      Signed, Tonny Bollman, MD  11/27/2023 4:13 PM    [facility]

## 2023-11-27 NOTE — Assessment & Plan Note (Signed)
I reviewed her recent right heart catheterization data.  Fortunately she has preserved cardiac output, normal right atrial pressure, and only mild pulmonary hypertension with a mean PA pressure of 32 mmHg.  I suspect this could be partially related to the presence of obstructive sleep apnea as well as left heart disease with her known chronic heart failure.  Her wedge pressure was only 11 mmHg at the time of cath.  I reassured her that she had a right heart catheterization 5 years ago and her hemodynamics on her recent cath actually look better than the previous one.  She has lost some significant weight and that may have benefited her.  She has an outpatient sleep evaluation scheduled by her primary care physician in [place] [facility].  I did not make any changes in her medical regimen today.  I recommended that she continue follow-up with Dr. Tenny Craw and I will make an appointment with her in about 6 months.

## 2023-12-02 DIAGNOSIS — R1010 Upper abdominal pain, unspecified: Secondary | ICD-10-CM | POA: Diagnosis not present

## 2023-12-02 DIAGNOSIS — R933 Abnormal findings on diagnostic imaging of other parts of digestive tract: Secondary | ICD-10-CM | POA: Diagnosis not present

## 2023-12-02 DIAGNOSIS — K449 Diaphragmatic hernia without obstruction or gangrene: Secondary | ICD-10-CM | POA: Diagnosis not present

## 2023-12-02 DIAGNOSIS — K3189 Other diseases of stomach and duodenum: Secondary | ICD-10-CM | POA: Diagnosis not present

## 2023-12-04 DIAGNOSIS — K3189 Other diseases of stomach and duodenum: Secondary | ICD-10-CM | POA: Diagnosis not present

## 2023-12-31 ENCOUNTER — Other Ambulatory Visit: Payer: Self-pay | Admitting: Internal Medicine

## 2024-01-13 DIAGNOSIS — N819 Female genital prolapse, unspecified: Secondary | ICD-10-CM | POA: Diagnosis not present

## 2024-01-13 DIAGNOSIS — Z960 Presence of urogenital implants: Secondary | ICD-10-CM | POA: Diagnosis not present

## 2024-01-13 DIAGNOSIS — Z01411 Encounter for gynecological examination (general) (routine) with abnormal findings: Secondary | ICD-10-CM | POA: Diagnosis not present

## 2024-01-13 DIAGNOSIS — Z1231 Encounter for screening mammogram for malignant neoplasm of breast: Secondary | ICD-10-CM | POA: Diagnosis not present

## 2024-02-12 ENCOUNTER — Other Ambulatory Visit: Payer: Self-pay | Admitting: Cardiology

## 2024-02-12 ENCOUNTER — Other Ambulatory Visit: Payer: Self-pay | Admitting: Internal Medicine

## 2024-02-12 ENCOUNTER — Telehealth: Payer: Self-pay | Admitting: Internal Medicine

## 2024-02-12 DIAGNOSIS — Z79899 Other long term (current) drug therapy: Secondary | ICD-10-CM

## 2024-02-12 MED ORDER — FUROSEMIDE 40 MG PO TABS: ORAL_TABLET | ORAL | 2 refills | Status: DC

## 2024-02-12 MED ORDER — FUROSEMIDE 20 MG PO TABS: ORAL_TABLET | ORAL | 3 refills | Status: DC

## 2024-02-12 NOTE — Telephone Encounter (Signed)
 Please send ASAP pt is out of medication

## 2024-02-12 NOTE — Telephone Encounter (Signed)
 Called patient back to see how patient takes her lasix . Patient stated she always takes lasix  40 mg twice a week, then takes 20 mg on the other days. Per Dr. Carolynne Citron, okay to place order and get patient a BMET in 2 weeks. Called patient back and let her know about order and lab work in 2 weeks. Patient was agreeable to plan.

## 2024-02-12 NOTE — Telephone Encounter (Signed)
 Pt c/o medication issue:  1. Name of Medication:   furosemide  (LASIX ) 40 MG tablet    2. How are you currently taking this medication (dosage and times per day)? ?  3. Are you having a reaction (difficulty breathing--STAT)? no  4. What is your medication issue? Pt doesn't want the 40 mg. She wants the 20 mg. Please resend for correct dosage to   [id number] Jonette Nestle, [place] - [address] AT [facility] OF [address] & [facility] [address] Phone: [phone]  Fax: [phone]

## 2024-02-15 DIAGNOSIS — M19072 Primary osteoarthritis, left ankle and foot: Secondary | ICD-10-CM | POA: Diagnosis not present

## 2024-02-15 DIAGNOSIS — M19071 Primary osteoarthritis, right ankle and foot: Secondary | ICD-10-CM | POA: Diagnosis not present

## 2024-02-23 DIAGNOSIS — L603 Nail dystrophy: Secondary | ICD-10-CM | POA: Diagnosis not present

## 2024-02-23 DIAGNOSIS — M19072 Primary osteoarthritis, left ankle and foot: Secondary | ICD-10-CM | POA: Diagnosis not present

## 2024-02-23 DIAGNOSIS — M65872 Other synovitis and tenosynovitis, left ankle and foot: Secondary | ICD-10-CM | POA: Diagnosis not present

## 2024-02-23 DIAGNOSIS — B351 Tinea unguium: Secondary | ICD-10-CM | POA: Diagnosis not present

## 2024-02-23 DIAGNOSIS — I739 Peripheral vascular disease, unspecified: Secondary | ICD-10-CM | POA: Diagnosis not present

## 2024-02-23 DIAGNOSIS — M792 Neuralgia and neuritis, unspecified: Secondary | ICD-10-CM | POA: Diagnosis not present

## 2024-02-23 DIAGNOSIS — M19071 Primary osteoarthritis, right ankle and foot: Secondary | ICD-10-CM | POA: Diagnosis not present

## 2024-03-07 DIAGNOSIS — R2689 Other abnormalities of gait and mobility: Secondary | ICD-10-CM | POA: Diagnosis not present

## 2024-03-07 DIAGNOSIS — M6281 Muscle weakness (generalized): Secondary | ICD-10-CM | POA: Diagnosis not present

## 2024-03-14 ENCOUNTER — Other Ambulatory Visit: Payer: Self-pay | Admitting: Internal Medicine

## 2024-03-16 DIAGNOSIS — Z79899 Other long term (current) drug therapy: Secondary | ICD-10-CM | POA: Diagnosis not present

## 2024-03-17 ENCOUNTER — Ambulatory Visit: Payer: Self-pay | Admitting: Internal Medicine

## 2024-03-17 LAB — BASIC METABOLIC PANEL WITH GFR
BUN/Creatinine Ratio: 20 (ref 12–28)
BUN: 24 mg/dL (ref 8–27)
CO2: 22 mmol/L (ref 20–29)
Calcium: 9.8 mg/dL (ref 8.7–10.3)
Chloride: 107 mmol/L — ABNORMAL HIGH (ref 96–106)
Creatinine, Ser: 1.19 mg/dL — ABNORMAL HIGH (ref 0.57–1.00)
Glucose: 75 mg/dL (ref 70–99)
Potassium: 5.4 mmol/L — ABNORMAL HIGH (ref 3.5–5.2)
Sodium: 144 mmol/L (ref 134–144)
eGFR: 47 mL/min/{1.73_m2} — ABNORMAL LOW (ref >59)

## 2024-03-18 ENCOUNTER — Other Ambulatory Visit: Payer: Self-pay

## 2024-03-18 MED ORDER — ENTRESTO 24-26 MG PO TABS: 1.0000 | ORAL_TABLET | Freq: Two times a day (BID) | ORAL | 2 refills | Status: AC

## 2024-03-20 NOTE — Progress Notes (Signed)
 Stop taking the potassium    Keep appt with Renelda Carry
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
## 2024-03-23 ENCOUNTER — Ambulatory Visit: Attending: Nurse Practitioner | Admitting: Nurse Practitioner

## 2024-03-23 ENCOUNTER — Encounter: Payer: Self-pay | Admitting: Nurse Practitioner

## 2024-03-23 VITALS — BP 108/75 | HR 49 | Ht 67.0 in | Wt 234.0 lb

## 2024-03-23 DIAGNOSIS — G4733 Obstructive sleep apnea (adult) (pediatric): Secondary | ICD-10-CM

## 2024-03-23 DIAGNOSIS — I10 Essential (primary) hypertension: Secondary | ICD-10-CM

## 2024-03-23 DIAGNOSIS — K219 Gastro-esophageal reflux disease without esophagitis: Secondary | ICD-10-CM | POA: Diagnosis not present

## 2024-03-23 DIAGNOSIS — I272 Pulmonary hypertension, unspecified: Secondary | ICD-10-CM

## 2024-03-23 DIAGNOSIS — E782 Mixed hyperlipidemia: Secondary | ICD-10-CM | POA: Diagnosis not present

## 2024-03-23 DIAGNOSIS — I5020 Unspecified systolic (congestive) heart failure: Secondary | ICD-10-CM | POA: Diagnosis not present

## 2024-03-23 MED ORDER — METOPROLOL SUCCINATE ER 25 MG PO TB24: 25.0000 mg | ORAL_TABLET | Freq: Every day | ORAL | 1 refills | Status: DC

## 2024-03-23 NOTE — Patient Instructions (Signed)
 Medication Instructions:  STOP Metoprolol  Tartrate  START Toprol  XL 25mg  Take 1 tablet once a day  *If you need a refill on your cardiac medications before your next appointment, please call your pharmacy*  Lab Work: BMET-TODAY If you have labs (blood work) drawn today and your tests are completely normal, you will receive your results only by: MyChart Message (if you have MyChart) OR A paper copy in the mail If you have any lab test that is abnormal or we need to change your treatment, we will call you to review the results.  Testing/Procedures: NONE ORDERED  Follow-Up: At [facility], you and your health needs are our priority.  As part of our continuing mission to provide you with exceptional heart care, our providers are all part of one team.  This team includes your primary Cardiologist (physician) and Advanced Practice Providers or APPs (Physician Assistants and Nurse Practitioners) who all work together to provide you with the care you need, when you need it.  Your next appointment:   6 month(s)  Provider:   Ola Berger, MD    We recommend signing up for the patient portal called "MyChart".  Sign up information is provided on this After Visit Summary.  MyChart is used to connect with patients for Virtual Visits (Telemedicine).  Patients are able to view lab/test results, encounter notes, upcoming appointments, etc.  Non-urgent messages can be sent to your provider as well.   To learn more about what you can do with MyChart, go to [email or web address].   Other Instructions Please check your weight daily. Please contact the office if you gain more than 2lbs in a day or 5lbs in a week.  Limit your salt intake to 1500-2000mg  per day or 500mg  of Sodium per meal.

## 2024-03-24 ENCOUNTER — Ambulatory Visit: Payer: Self-pay | Admitting: Nurse Practitioner

## 2024-03-24 ENCOUNTER — Encounter: Payer: Self-pay | Admitting: Internal Medicine

## 2024-03-24 LAB — BASIC METABOLIC PANEL WITH GFR
BUN/Creatinine Ratio: 18 (ref 12–28)
BUN: 21 mg/dL (ref 8–27)
CO2: 23 mmol/L (ref 20–29)
Calcium: 9.3 mg/dL (ref 8.7–10.3)
Chloride: 105 mmol/L (ref 96–106)
Creatinine, Ser: 1.16 mg/dL — ABNORMAL HIGH (ref 0.57–1.00)
Glucose: 82 mg/dL (ref 70–99)
Potassium: 4.9 mmol/L (ref 3.5–5.2)
Sodium: 143 mmol/L (ref 134–144)
eGFR: 48 mL/min/{1.73_m2} — ABNORMAL LOW (ref >59)

## 2024-03-24 NOTE — Telephone Encounter (Signed)
 error

## 2024-03-31 ENCOUNTER — Other Ambulatory Visit: Payer: Self-pay | Admitting: Internal Medicine

## 2024-04-12 DIAGNOSIS — Z960 Presence of urogenital implants: Secondary | ICD-10-CM | POA: Diagnosis not present

## 2024-05-04 DIAGNOSIS — I77810 Thoracic aortic ectasia: Secondary | ICD-10-CM | POA: Diagnosis not present

## 2024-05-04 DIAGNOSIS — I420 Dilated cardiomyopathy: Secondary | ICD-10-CM | POA: Diagnosis not present

## 2024-05-04 DIAGNOSIS — I272 Pulmonary hypertension, unspecified: Secondary | ICD-10-CM | POA: Diagnosis not present

## 2024-05-04 DIAGNOSIS — E211 Secondary hyperparathyroidism, not elsewhere classified: Secondary | ICD-10-CM | POA: Diagnosis not present

## 2024-05-04 DIAGNOSIS — Z79899 Other long term (current) drug therapy: Secondary | ICD-10-CM | POA: Diagnosis not present

## 2024-05-04 DIAGNOSIS — Z0000 Encounter for general adult medical examination without abnormal findings: Secondary | ICD-10-CM | POA: Diagnosis not present

## 2024-05-04 DIAGNOSIS — E785 Hyperlipidemia, unspecified: Secondary | ICD-10-CM | POA: Diagnosis not present

## 2024-05-04 DIAGNOSIS — I5022 Chronic systolic (congestive) heart failure: Secondary | ICD-10-CM | POA: Diagnosis not present

## 2024-05-04 DIAGNOSIS — M109 Gout, unspecified: Secondary | ICD-10-CM | POA: Diagnosis not present

## 2024-05-04 DIAGNOSIS — I700 Atherosclerosis of aorta: Secondary | ICD-10-CM | POA: Diagnosis not present

## 2024-05-04 DIAGNOSIS — N183 Chronic kidney disease, stage 3 unspecified: Secondary | ICD-10-CM | POA: Diagnosis not present

## 2024-05-04 DIAGNOSIS — E559 Vitamin D deficiency, unspecified: Secondary | ICD-10-CM | POA: Diagnosis not present

## 2024-05-04 LAB — LAB REPORT - SCANNED: EGFR: 49

## 2024-05-17 DIAGNOSIS — M6281 Muscle weakness (generalized): Secondary | ICD-10-CM | POA: Diagnosis not present

## 2024-05-17 DIAGNOSIS — R2689 Other abnormalities of gait and mobility: Secondary | ICD-10-CM | POA: Diagnosis not present

## 2024-05-22 ENCOUNTER — Other Ambulatory Visit: Payer: Self-pay | Admitting: Internal Medicine

## 2024-05-30 DIAGNOSIS — R2689 Other abnormalities of gait and mobility: Secondary | ICD-10-CM | POA: Diagnosis not present

## 2024-05-30 DIAGNOSIS — M6281 Muscle weakness (generalized): Secondary | ICD-10-CM | POA: Diagnosis not present

## 2024-06-09 DIAGNOSIS — M6281 Muscle weakness (generalized): Secondary | ICD-10-CM | POA: Diagnosis not present

## 2024-06-09 DIAGNOSIS — R2689 Other abnormalities of gait and mobility: Secondary | ICD-10-CM | POA: Diagnosis not present

## 2024-06-26 DIAGNOSIS — F4323 Adjustment disorder with mixed anxiety and depressed mood: Secondary | ICD-10-CM | POA: Diagnosis not present

## 2024-07-26 DIAGNOSIS — F4323 Adjustment disorder with mixed anxiety and depressed mood: Secondary | ICD-10-CM | POA: Diagnosis not present

## 2024-07-27 DIAGNOSIS — M85852 Other specified disorders of bone density and structure, left thigh: Secondary | ICD-10-CM | POA: Diagnosis not present

## 2024-07-27 DIAGNOSIS — M85851 Other specified disorders of bone density and structure, right thigh: Secondary | ICD-10-CM | POA: Diagnosis not present

## 2024-08-01 DIAGNOSIS — B349 Viral infection, unspecified: Secondary | ICD-10-CM | POA: Diagnosis not present

## 2024-08-01 DIAGNOSIS — R053 Chronic cough: Secondary | ICD-10-CM | POA: Diagnosis not present

## 2024-08-15 DIAGNOSIS — F331 Major depressive disorder, recurrent, moderate: Secondary | ICD-10-CM | POA: Diagnosis not present

## 2024-08-15 DIAGNOSIS — R052 Subacute cough: Secondary | ICD-10-CM | POA: Diagnosis not present

## 2024-08-15 DIAGNOSIS — R5383 Other fatigue: Secondary | ICD-10-CM | POA: Diagnosis not present

## 2024-08-15 DIAGNOSIS — G4730 Sleep apnea, unspecified: Secondary | ICD-10-CM | POA: Diagnosis not present

## 2024-08-15 DIAGNOSIS — E538 Deficiency of other specified B group vitamins: Secondary | ICD-10-CM | POA: Diagnosis not present

## 2024-08-15 DIAGNOSIS — F439 Reaction to severe stress, unspecified: Secondary | ICD-10-CM | POA: Diagnosis not present

## 2024-08-21 DIAGNOSIS — R052 Subacute cough: Secondary | ICD-10-CM | POA: Diagnosis not present

## 2024-08-21 DIAGNOSIS — I517 Cardiomegaly: Secondary | ICD-10-CM | POA: Diagnosis not present

## 2024-08-23 DIAGNOSIS — I509 Heart failure, unspecified: Secondary | ICD-10-CM | POA: Diagnosis not present

## 2024-08-23 DIAGNOSIS — R053 Chronic cough: Secondary | ICD-10-CM | POA: Diagnosis not present

## 2024-08-23 DIAGNOSIS — K219 Gastro-esophageal reflux disease without esophagitis: Secondary | ICD-10-CM | POA: Diagnosis not present

## 2024-08-28 NOTE — Progress Notes (Addendum)
 Cardiology Office Note:    Date:  09/02/2024  ID:  TOY SAMARIN, DOB 1945/11/04, MRN [id number]  PCP:  Teresa Channel, MD    History of Present Illness:    Crystal Yoder is a 78 y.o. female with a hx of HTN, NICM, GERD, HLD, reactive airway disease, mild AS, PVCs. HOlter monitor in 05/2018 that showed frequent PVCs with a 31% burden. Echo in Sept /2019 showed EF 30-35% with moderate dilation of the LV, moderate LVH, mild-moderate aortic valve stenosis.  -Sept 2019  CCTA  Ca score was  9 (38th percentile), mild, non-obstructive CAD, severely dilated pulmonary artery measuring 53 mm.   -Oct 2019  Right heart catheterization showed moderate elevation of right heart pressures with moderate pulmonary htn.  -FEb 2020 Repeat echo  showed improvement in EF to 45-50%, abnormal septal motion consistent with LBBB.  -June 2020 Holter monitor showed 7% PVC burden.  - NOv 2023 echo showed LVEF 40 to 45%  Difficult windows.   Monitor showed 6% PVCs  -Jan 2024:  Cardiac MRI showed LVEF 36%  There did not appear to be an infiltrative process  I saw the pt in clinic in Feb 2024 Aldactone  added to regimen     The pt was last in cardiology clinc in Nov 2024    She was seen in ER on 09/22/23  She complained of chest pains  Worse with inspiration   Chest CT showed no PE   PA enlarged, suggestive of pulmonary HTN   This has been noted before    The patient also said she felt like a bar was under her breast, complains of a hardness on L breast   Not exacerbated with activity  Other times when moving around will have discomfort in lower abdomen   Overall is fatigued   She admits to a lot of stress as caring for husband who has dementia.  I saw the pt in Dec 2024    Dec 2024 RHC done due to enlarged PA   Showed mild pulnonary HTN   PAP 52/24  PVR 3.2 WU     Seen by CHRISTELLA Fell in Jan 2025 and CHARLENA Alberts in May 2025  The pt notes she remains under increased stress Living in small home   Husband has dementia   Under hospice care . She says she spent much of October in bed She has been coughing a lot   Wakes up at night  [employer].   Didn't help  Put on prednisone for 4 to 5 days      Breathing is fair   Denies CP       Past Medical History:  Diagnosis Date   Arthritis    Arthropathy, unspecified, site unspecified    COPD (chronic obstructive pulmonary disease) (HCC)    Esophageal reflux    Essential hypertension 05/31/2017   Obesity, unspecified    Obstructive sleep apnea (adult) (pediatric)    Other acute sinusitis    Other primary cardiomyopathies    Psoriasis    Shortness of breath    Unspecified venous (peripheral) insufficiency     Past Surgical History:  Procedure Laterality Date   ABDOMINAL HYSTERECTOMY  2011   BLADDER SURGERY  2011   tac=ant/post   CHOLECYSTECTOMY N/A 06/01/2017   Procedure: LAPAROSCOPIC CHOLECYSTECTOMY;  Surgeon: Tanda Locus, MD;  Location: [facility];  Service: General;  Laterality: N/A;   IRRIGATION AND DEBRIDEMENT OF WOUND WITH SPLIT THICKNESS SKIN GRAFT Right 01/25/2014  Procedure: RIGHT FOOT IRRIGATION AND DEBRIDEMENT WITH ACELL/SPLICK THICKNESS SKINGRAFT WITH VAC;  Surgeon: Estefana Reichert, DO;  Location: [facility];  Service: Plastics;  Laterality: Right;   PELVIC FLOOR REPAIR     REPLACEMENT TOTAL KNEE     rt and lt   RIGHT HEART CATH N/A 07/28/2018   Procedure: RIGHT HEART CATH;  Surgeon: Burnard Debby LABOR, MD;  Location: [facility] INVASIVE CV LAB;  Service: Cardiovascular;  Laterality: N/A;   RIGHT HEART CATH N/A 10/07/2023   Procedure: RIGHT HEART CATH;  Surgeon: Wonda Sharper, MD;  Location: [facility] INVASIVE CV LAB;  Service: Cardiovascular;  Laterality: N/A;   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   ascorbic acid (VITAMIN C) 100 MG tablet Take 1 tablet by mouth as needed.   Cholecalciferol 25 MCG (1000 UT)  capsule Take 1,000 Units by mouth as needed.   clotrimazole-betamethasone (LOTRISONE) cream as needed.   colchicine  0.6 MG tablet Take 1 tablet (0.6 mg total) by mouth 2 (two) times daily. (Patient taking differently: Take 0.6 mg by mouth as needed.)   fluticasone  (FLONASE ) 50 MCG/ACT nasal spray Place 2 sprays into both nostrils daily as needed for allergies.   furosemide  (LASIX ) 20 MG tablet Take one tablet by mouth 5 days a week Monday, Wednesday, Thursday, Friday,and Sunday and take two tablets by mouth twice a week on Tuesday and Saturday   guaiFENesin (MUCINEX) 600 MG 12 hr tablet Take 600 mg by mouth as needed.   Melatonin 10 MG TABS Take 20 mg by mouth at bedtime as needed (sleep).   metoprolol  succinate (TOPROL -XL) 25 MG 24 hr tablet Take 1 tablet (25 mg total) by mouth daily.   pantoprazole  (PROTONIX ) 40 MG tablet Take 40 mg by mouth every other day.   potassium chloride  SA (KLOR-CON  M) 20 MEQ tablet TAKE 1 TABLET(20 MEQ) BY MOUTH EVERY OTHER DAY ON THE SAME DAY AS LASIX  40 MG   Quercetin 250 MG TABS Take 500 mg by mouth as needed.   rosuvastatin  (CRESTOR ) 5 MG tablet TAKE 1 TABLET(5 MG) BY MOUTH DAILY   sacubitril -valsartan  (ENTRESTO ) 24-26 MG Take 1 tablet by mouth 2 (two) times daily.   spironolactone  (ALDACTONE ) 25 MG tablet TAKE 1/2 TABLET(12.5 MG) BY MOUTH DAILY (Patient taking differently: Take 25 mg by mouth daily. Per patient taking 12.5 mg daily)     Allergies:   Penicillins, Sulfa antibiotics, and Sulfonamide derivatives   Social History   Socioeconomic History   Marital status: Married    Spouse name: Not on file   Number of children: Not on file   Years of education: Not on file   Highest education level: Not on file  Occupational History   Occupation: Engineer, site  Tobacco Use   Smoking status: Former    Current packs/day: 0.00    Average packs/day: 0.5 packs/day for 10.0 years (5.0 ttl pk-yrs)    Types: Cigarettes    Start date: 10/27/1969    Quit date:  10/28/1979    Years since quitting: 44.8   Smokeless tobacco: Never  Vaping Use   Vaping status: Never Used  Substance and Sexual Activity   Alcohol use: Yes    Comment: rare   Drug use: No   Sexual activity: Not on file  Other Topics Concern   Not on file  Social History Narrative  Not on file   Social Drivers of Health   Financial Resource Strain: Medium Risk (11/08/2021)   Overall Financial Resource Strain (CARDIA)    Difficulty of Paying Living Expenses: Somewhat hard  Food Insecurity: Low Risk  (06/19/2023)   Received from [facility]   Hunger Vital Sign    Within the past 12 months, you worried that your food would run out before you got money to buy more: Never true    Within the past 12 months, the food you bought just didn't last and you didn't have money to get more. : Never true  Transportation Needs: No Transportation Needs (06/19/2023)   Received from [facility]    In the past 12 months, has lack of reliable transportation kept you from medical appointments, meetings, work or from getting things needed for daily living? : No  Physical Activity: Not on file  Stress: Stress Concern Present (10/31/2022)   [employer] of Occupational Health - Occupational Stress Questionnaire    Feeling of Stress : Very much  Social Connections: Not on file     Family History: The patient's family history includes Diabetes in her paternal grandfather; Diverticulitis in her father; Prostate cancer in her father.   EKGs/Labs/Other Studies Reviewed:    The following studies were reviewed today:  Chest CT  09/22/23  1. Negative for acute pulmonary embolism.  No acute aortic syndrome. 2. Dilated pulmonary arteries suggesting pulmonary hypertension.   MRI 11/21/22  1. Moderately reduced left ventricular systolic function with mild LV dilation. LVEF 36%. Global hypokinesis with moderate hypokinesis of the basal to mid inferior wall and severe hypokinesis of  the basal to apical inferoseptum.   2. Mildly reduced right ventricular systolic function with normal RV size. RVEF 37%.   3. No delayed myocardial enhancement. ECV 30%. No definite myocardial edema. No evidence of scar, fibrosis, inflammation, infarction or infiltrative cardiomyopathic process.   4.  Severe dilation of main pulmonary artery.      Echo  Nov 2023  Left ventricular ejection fraction, by estimation, is 40 to 45%. The left ventricle has mildly decreased function. The left ventricle demonstrates global hypokinesis. The left ventricular internal cavity size was severely dilated. Left ventricular diastolic parameters are consistent with Grade II diastolic dysfunction (pseudonormalization). Elevated left atrial pressure. 1. Right ventricular systolic function is normal. The right ventricular size is normal. Tricuspid regurgitation signal is inadequate for assessing PA pressure. 2. The mitral valve is normal in structure. Trivial mitral valve regurgitation. No evidence of mitral stenosis. 3. The aortic valve was not well visualized. Aortic valve regurgitation is trivial. Mild aortic valve stenosis 4. 5. Aortic dilatation noted. There is dilatation of the ascending aorta, measuring 40 mm. The inferior vena cava is dilated in size with >50% respiratory variability, suggesting right atrial pressure of 8 mmHg.  Long Term Monitor 03/2019  1. NSR with sinus bradycardia and sinus tachycardia 2. NSVT and NS atrial tachycardia 3. Frequent PVC's at time with bigeminy and trigeminy 4. No prolonged pauses RHC October  2019  A: A-wave 12, V wave 12; mean 10 RV: 59/12 PA: 62/26; mean 37 PW: A-wave 20, V wave 22; mean 18 Repeat PA: 55/25; mean 36 Oxygen saturation in the pulmonary artery was 73% in the AO 96%. By the Fick method, cardiac output was 7.1 L/min with a cardiac index of 3.1 L/min/m. PVR: 2.66 WU    CT Coronary 07/19/2018  IMPRESSION: 1. Coronary calcium  score  of 9. This was  38 percentile for age and sex matched control.   2. Normal coronary origin with right dominance.   3. This study is affected by motion, however there is only mild non-obstructive CAD.   4. Pulmonary artery is severely dilated measuring 53 mm suggestive of pulmonary hypertension.  EKG:  EKG shows NSR   RBBB  LAFB    Recent Labs: 09/30/2023: Platelets 187 10/07/2023: Hemoglobin 12.9 09/02/2024: BUN 25; Creatinine, Ser 1.30; NT-Pro BNP 2,168; Potassium 4.7; Sodium 143  Recent Lipid Panel    Component Value Date/Time   CHOL 196 03/05/2021 1447   TRIG 111 03/05/2021 1447   TRIG 75 10/23/2006 0812   HDL 73 03/05/2021 1447   CHOLHDL 2.7 03/05/2021 1447   CHOLHDL 3 10/31/2013 1440   VLDL 8.4 10/31/2013 1440   LDLCALC 104 (H) 03/05/2021 1447   LDLDIRECT 121.5 10/31/2013 1440      STOP-Bang Score:  6       Physical Exam:    VS:  BP 116/80 (BP Location: Left Arm, Patient Position: Sitting, Cuff Size: Large)   Pulse 76   Ht 5' 7 (1.702 m)   Wt 230 lb (104.3 kg)   SpO2 91%   BMI 36.02 kg/m      Wt Readings from Last 3 Encounters:  09/02/24 230 lb (104.3 kg)  03/23/24 234 lb (106.1 kg)  11/27/23 230 lb 3.2 oz (104.4 kg)     GEN:  Obese 78 yo in NAD   Examined in wheelchair   HEENT: Normal NECK: JVP is not elevated  CARDIAC: RRR, no murmurs RESPIRATORY:  CTA   ABDOMEN: Nontender Ext are without significant edema       ASSESSMENT:     HFrEF  Nonischemic   MRI in 2024 LVEF 36%   R heart cath in Jan 2025 showed mild pulmonary HTN  PAP 52/24   PCWP 11    Will check BMET and BNP today  Overall volume is not bad  Cont: Entresto , Toprol  XL SPirnnoactone.    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

## 2024-08-30 ENCOUNTER — Telehealth: Payer: Self-pay | Admitting: Internal Medicine

## 2024-08-30 NOTE — Telephone Encounter (Signed)
 Spoke with pt. Pt is going to have recent tests faxed over for Dr Okey to review.

## 2024-08-30 NOTE — Telephone Encounter (Signed)
 Pt requesting c/b regarding Notes and lab results from PCP. Please advise

## 2024-09-02 ENCOUNTER — Ambulatory Visit: Attending: Internal Medicine | Admitting: Internal Medicine

## 2024-09-02 ENCOUNTER — Encounter: Payer: Self-pay | Admitting: Internal Medicine

## 2024-09-02 VITALS — BP 116/80 | HR 76 | Ht 67.0 in | Wt 230.0 lb

## 2024-09-02 DIAGNOSIS — I5020 Unspecified systolic (congestive) heart failure: Secondary | ICD-10-CM

## 2024-09-02 DIAGNOSIS — I10 Essential (primary) hypertension: Secondary | ICD-10-CM | POA: Diagnosis not present

## 2024-09-02 DIAGNOSIS — R5383 Other fatigue: Secondary | ICD-10-CM | POA: Diagnosis not present

## 2024-09-02 NOTE — Patient Instructions (Signed)
 Medication Instructions:  Your physician recommends that you continue on your current medications as directed. Please refer to the Current Medication list given to you today.  *If you need a refill on your cardiac medications before your next appointment, please call your pharmacy*  Lab Work: TODAY: BMET, BNP, Uric Acid, Hgb A1c If you have labs (blood work) drawn today and your tests are completely normal, you will receive your results only by: MyChart Message (if you have MyChart) OR A paper copy in the mail If you have any lab test that is abnormal or we need to change your treatment, we will call you to review the results.  Testing/Procedures: Your physician has recommended that you have a home sleep study. This test records several body functions during sleep, including: brain activity, eye movement, oxygen and carbon dioxide blood levels, heart rate and rhythm, breathing rate and rhythm, the flow of air through your mouth and nose, snoring, body muscle movements, and chest and belly movement.  Follow-Up: At [facility], you and your health needs are our priority.  As part of our continuing mission to provide you with exceptional heart care, our providers are all part of one team.  This team includes your primary Cardiologist (physician) and Advanced Practice Providers or APPs (Physician Assistants and Nurse Practitioners) who all work together to provide you with the care you need, when you need it.  Your next appointment:   6 month(s)  Provider:   Vina Gull, MD   We recommend signing up for the patient portal called MyChart.  Sign up information is provided on this After Visit Summary.  MyChart is used to connect with patients for Virtual Visits (Telemedicine).  Patients are able to view lab/test results, encounter notes, upcoming appointments, etc.  Non-urgent messages can be sent to your provider as well.    To learn more about what you can do with MyChart, go to  [email or web address].

## 2024-09-03 LAB — BASIC METABOLIC PANEL WITH GFR
BUN/Creatinine Ratio: 19 (ref 12–28)
BUN: 25 mg/dL (ref 8–27)
CO2: 24 mmol/L (ref 20–29)
Calcium: 9 mg/dL (ref 8.7–10.3)
Chloride: 104 mmol/L (ref 96–106)
Creatinine, Ser: 1.3 mg/dL — ABNORMAL HIGH (ref 0.57–1.00)
Glucose: 105 mg/dL — ABNORMAL HIGH (ref 70–99)
Potassium: 4.7 mmol/L (ref 3.5–5.2)
Sodium: 143 mmol/L (ref 134–144)
eGFR: 42 mL/min/1.73 — ABNORMAL LOW (ref >59)

## 2024-09-03 LAB — HEMOGLOBIN A1C
Est. average glucose Bld gHb Est-mCnc: 131 mg/dL
Hgb A1c MFr Bld: 6.2 % — ABNORMAL HIGH (ref 4.8–5.6)

## 2024-09-03 LAB — URIC ACID: Uric Acid: 9.4 mg/dL — ABNORMAL HIGH (ref 3.1–7.9)

## 2024-09-03 LAB — PRO B NATRIURETIC PEPTIDE: NT-Pro BNP: 2168 pg/mL — ABNORMAL HIGH (ref 0–738)

## 2024-09-06 ENCOUNTER — Ambulatory Visit: Payer: Self-pay | Admitting: Internal Medicine

## 2024-09-13 ENCOUNTER — Telehealth: Payer: Self-pay | Admitting: Internal Medicine

## 2024-09-13 NOTE — Telephone Encounter (Signed)
 What problem are you experiencing? Pt said she keeps getting calls to do a sleep study but she has had a horrible cough since Oct and cannot get rid of it. She states until she can get over this she does not want to do a sleep study because she can't sleep because of the coughing.  Who is your medical equipment company?   3)    If patient is calling about their sleep study results please route to [facility] Sleep Study Pool.   Please route to the sleep study coordinator.

## 2024-09-14 DIAGNOSIS — N819 Female genital prolapse, unspecified: Secondary | ICD-10-CM | POA: Diagnosis not present

## 2024-09-14 DIAGNOSIS — Z960 Presence of urogenital implants: Secondary | ICD-10-CM | POA: Diagnosis not present

## 2024-09-14 NOTE — Telephone Encounter (Signed)
 Patient is following up. She says the company contacted her and informed her that the sleep study has already been mailed to her house. She would like to know if the order can be cancelled. Please advise.

## 2024-09-17 ENCOUNTER — Other Ambulatory Visit: Payer: Self-pay | Admitting: Internal Medicine

## 2024-09-19 ENCOUNTER — Ambulatory Visit (INDEPENDENT_AMBULATORY_CARE_PROVIDER_SITE_OTHER)

## 2024-09-19 VITALS — BP 108/72 | HR 68 | Temp 97.6°F | Ht 67.0 in | Wt 230.0 lb

## 2024-09-19 DIAGNOSIS — Z87891 Personal history of nicotine dependence: Secondary | ICD-10-CM | POA: Diagnosis not present

## 2024-09-19 DIAGNOSIS — Z9109 Other allergy status, other than to drugs and biological substances: Secondary | ICD-10-CM | POA: Diagnosis not present

## 2024-09-19 DIAGNOSIS — R600 Localized edema: Secondary | ICD-10-CM | POA: Diagnosis not present

## 2024-09-19 DIAGNOSIS — R053 Chronic cough: Secondary | ICD-10-CM

## 2024-09-19 DIAGNOSIS — K219 Gastro-esophageal reflux disease without esophagitis: Secondary | ICD-10-CM

## 2024-09-19 LAB — CBC WITH DIFFERENTIAL/PLATELET
Basophils Absolute: 0.1 K/uL (ref 0.0–0.1)
Basophils Relative: 0.9 % (ref 0.0–3.0)
Eosinophils Absolute: 0.2 K/uL (ref 0.0–0.7)
Eosinophils Relative: 3.2 % (ref 0.0–5.0)
HCT: 37.2 % (ref 36.0–46.0)
Hemoglobin: 12 g/dL (ref 12.0–15.0)
Lymphocytes Relative: 19.7 % (ref 12.0–46.0)
Lymphs Abs: 1.1 K/uL (ref 0.7–4.0)
MCHC: 32.4 g/dL (ref 30.0–36.0)
MCV: 91 fl (ref 78.0–100.0)
Monocytes Absolute: 0.7 K/uL (ref 0.1–1.0)
Monocytes Relative: 12.5 % — ABNORMAL HIGH (ref 3.0–12.0)
Neutro Abs: 3.6 K/uL (ref 1.4–7.7)
Neutrophils Relative %: 63.7 % (ref 43.0–77.0)
Platelets: 165 K/uL (ref 150.0–400.0)
RBC: 4.09 Mil/uL (ref 3.87–5.11)
RDW: 15.4 % (ref 11.5–15.5)
WBC: 5.7 K/uL (ref 4.0–10.5)

## 2024-09-19 LAB — BRAIN NATRIURETIC PEPTIDE: Pro B Natriuretic peptide (BNP): 497 pg/mL — ABNORMAL HIGH (ref 0.0–100.0)

## 2024-09-19 MED ORDER — TRELEGY ELLIPTA 100-62.5-25 MCG/ACT IN AEPB: 1.0000 | INHALATION_SPRAY | Freq: Every day | RESPIRATORY_TRACT | Status: DC

## 2024-09-19 MED ORDER — FLUTICASONE-SALMETEROL 250-50 MCG/ACT IN AEPB: 1.0000 | INHALATION_SPRAY | Freq: Two times a day (BID) | RESPIRATORY_TRACT | 11 refills | Status: DC

## 2024-09-19 MED ORDER — BUDESONIDE-FORMOTEROL FUMARATE 160-4.5 MCG/ACT IN AERO: 2.0000 | INHALATION_SPRAY | Freq: Two times a day (BID) | RESPIRATORY_TRACT | 6 refills | Status: DC

## 2024-09-19 MED ORDER — IPRATROPIUM-ALBUTEROL 0.5-2.5 (3) MG/3ML IN SOLN: 3.0000 mL | Freq: Four times a day (QID) | RESPIRATORY_TRACT | 1 refills | Status: DC | PRN

## 2024-09-19 NOTE — Patient Instructions (Addendum)
 It was a pleasure to see you today. Please have your labs drawn in our clinic today. Your pulmonary function test will be scheduled at check out Take 1 puff of trelegy once  a day for next 2 weeks- this is sample medicine Please rinse your mouth after inhaler use.  When you complete it, start the new inhaler advair 1 puff  twice a day - sent to your pharmacy We also have sent a nebulizer machine. Use duonebs solution as needed through the nebulizer upto 4 times day.  Can take albuterol  inhaler as needed Use claritin and flonase  daily Limit salt intake Call us  if inhalers or nebulizer medicines are too expensive, we can try alternatives

## 2024-09-19 NOTE — Assessment & Plan Note (Addendum)
 Patient takes PPI Advised patient to avoid late-night meal Orders:   Pulmonary function test; Future
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
## 2024-09-19 NOTE — Assessment & Plan Note (Addendum)
 I explained to the patient in detail that chronic cough could be a manifestation of chronic sinusitis with post nasal drip, cough variant asthma, Nonasthmatic eosinophilic bronchitis, gastroesophageal reflux, laryngopharyngeal reflux, parenchymal lung disease like fibrosis, pulmonary edema from congestive heart failure, habitual cough or lung cancer. Chronic cough likely related to pulmonary edema, allergies and postnasal drip, cannot rule out cough variant asthma, and GERD Since patient has positive response to prednisone and albuterol , will start her on Advair disc for possible asthma.  If not covered by insurance can change to symbicort  2 puff BID. Trelegy given for sample today Start DuoNebs through nebulizer machine to be used as needed-written instruction provided Will need PFT prior to next follow Orders:   CBC with Differential   IgE; Future   B Nat Peptide; Future   Pulmonary function test; Future   fluticasone -salmeterol (ADVAIR) 250-50 MCG/ACT AEPB; Inhale 1 puff into the lungs every 12 (twelve) hours.   ipratropium-albuterol  (DUONEB) 0.5-2.5 (3) MG/3ML SOLN; Take 3 mLs by nebulization every 6 (six) hours as needed. (Patient not taking: Reported on 10/02/2024)

## 2024-09-20 LAB — IGE: IgE (Immunoglobulin E), Serum: 38 kU/L (ref <=114)

## 2024-09-21 ENCOUNTER — Telehealth: Payer: Self-pay | Admitting: Internal Medicine

## 2024-09-21 NOTE — Telephone Encounter (Signed)
 Pt c/o medication issue:  1. Name of Medication:   Fluticasone -Umeclidin-Vilant (TRELEGY ELLIPTA ) 100-62.5-25 MCG/ACT AEPB   2. How are you currently taking this medication (dosage and times per day)?   3. Are you having a reaction (difficulty breathing--STAT)?   4. What is your medication issue?   Patient called to report to Dr. Okey that she has now been prescribed this medication by her pulmonologist for her persistent cough and wants to confirm this would be OK with Dr. Okey.

## 2024-09-24 ENCOUNTER — Other Ambulatory Visit: Payer: Self-pay | Admitting: Nurse Practitioner

## 2024-09-30 ENCOUNTER — Emergency Department (HOSPITAL_BASED_OUTPATIENT_CLINIC_OR_DEPARTMENT_OTHER)

## 2024-09-30 ENCOUNTER — Encounter (HOSPITAL_BASED_OUTPATIENT_CLINIC_OR_DEPARTMENT_OTHER): Payer: Self-pay | Admitting: *Deleted

## 2024-09-30 ENCOUNTER — Other Ambulatory Visit: Payer: Self-pay

## 2024-09-30 ENCOUNTER — Observation Stay (HOSPITAL_BASED_OUTPATIENT_CLINIC_OR_DEPARTMENT_OTHER)
Admission: EM | Admit: 2024-09-30 | Discharge: 2024-10-02 | DRG: 291 | Disposition: A | Attending: Emergency Medicine | Admitting: Emergency Medicine

## 2024-09-30 ENCOUNTER — Ambulatory Visit: Payer: Self-pay

## 2024-09-30 DIAGNOSIS — R059 Cough, unspecified: Secondary | ICD-10-CM | POA: Diagnosis not present

## 2024-09-30 DIAGNOSIS — J42 Unspecified chronic bronchitis: Secondary | ICD-10-CM

## 2024-09-30 DIAGNOSIS — I5043 Acute on chronic combined systolic (congestive) and diastolic (congestive) heart failure: Secondary | ICD-10-CM | POA: Diagnosis not present

## 2024-09-30 DIAGNOSIS — R0602 Shortness of breath: Secondary | ICD-10-CM | POA: Diagnosis not present

## 2024-09-30 DIAGNOSIS — G4733 Obstructive sleep apnea (adult) (pediatric): Secondary | ICD-10-CM | POA: Diagnosis not present

## 2024-09-30 DIAGNOSIS — J9601 Acute respiratory failure with hypoxia: Secondary | ICD-10-CM | POA: Diagnosis not present

## 2024-09-30 DIAGNOSIS — I517 Cardiomegaly: Secondary | ICD-10-CM | POA: Diagnosis not present

## 2024-09-30 DIAGNOSIS — E785 Hyperlipidemia, unspecified: Secondary | ICD-10-CM | POA: Diagnosis present

## 2024-09-30 DIAGNOSIS — I428 Other cardiomyopathies: Secondary | ICD-10-CM

## 2024-09-30 DIAGNOSIS — R918 Other nonspecific abnormal finding of lung field: Secondary | ICD-10-CM | POA: Diagnosis not present

## 2024-09-30 DIAGNOSIS — K219 Gastro-esophageal reflux disease without esophagitis: Secondary | ICD-10-CM

## 2024-09-30 DIAGNOSIS — R9431 Abnormal electrocardiogram [ECG] [EKG]: Secondary | ICD-10-CM | POA: Diagnosis present

## 2024-09-30 DIAGNOSIS — I509 Heart failure, unspecified: Secondary | ICD-10-CM | POA: Diagnosis not present

## 2024-09-30 DIAGNOSIS — R051 Acute cough: Secondary | ICD-10-CM | POA: Diagnosis not present

## 2024-09-30 LAB — CBC WITH DIFFERENTIAL/PLATELET
Abs Immature Granulocytes: 0.04 K/uL (ref 0.00–0.07)
Basophils Absolute: 0.1 K/uL (ref 0.0–0.1)
Basophils Relative: 1 %
Eosinophils Absolute: 0.1 K/uL (ref 0.0–0.5)
Eosinophils Relative: 1 %
HCT: 40.3 % (ref 36.0–46.0)
Hemoglobin: 12.5 g/dL (ref 12.0–15.0)
Immature Granulocytes: 0 %
Lymphocytes Relative: 18 %
Lymphs Abs: 1.6 K/uL (ref 0.7–4.0)
MCH: 28.9 pg (ref 26.0–34.0)
MCHC: 31 g/dL (ref 30.0–36.0)
MCV: 93.3 fL (ref 80.0–100.0)
Monocytes Absolute: 1 K/uL (ref 0.1–1.0)
Monocytes Relative: 11 %
Neutro Abs: 6.1 K/uL (ref 1.7–7.7)
Neutrophils Relative %: 69 %
Platelets: 193 K/uL (ref 150–400)
RBC: 4.32 MIL/uL (ref 3.87–5.11)
RDW: 14.6 % (ref 11.5–15.5)
WBC: 8.9 K/uL (ref 4.0–10.5)
nRBC: 0 % (ref 0.0–0.2)

## 2024-09-30 LAB — CBC
HCT: 39.5 % (ref 36.0–46.0)
Hemoglobin: 12.3 g/dL (ref 12.0–15.0)
MCH: 29 pg (ref 26.0–34.0)
MCHC: 31.1 g/dL (ref 30.0–36.0)
MCV: 93.2 fL (ref 80.0–100.0)
Platelets: 195 K/uL (ref 150–400)
RBC: 4.24 MIL/uL (ref 3.87–5.11)
RDW: 14.6 % (ref 11.5–15.5)
WBC: 8.6 K/uL (ref 4.0–10.5)
nRBC: 0 % (ref 0.0–0.2)

## 2024-09-30 LAB — COMPREHENSIVE METABOLIC PANEL WITH GFR
ALT: 10 U/L (ref 0–44)
AST: 22 U/L (ref 15–41)
Albumin: 3.9 g/dL (ref 3.5–5.0)
Alkaline Phosphatase: 92 U/L (ref 38–126)
Anion gap: 11 (ref 5–15)
BUN: 19 mg/dL (ref 8–23)
CO2: 27 mmol/L (ref 22–32)
Calcium: 9.6 mg/dL (ref 8.9–10.3)
Chloride: 107 mmol/L (ref 98–111)
Creatinine, Ser: 1.28 mg/dL — ABNORMAL HIGH (ref 0.44–1.00)
GFR, Estimated: 42 mL/min — ABNORMAL LOW (ref >60)
Glucose, Bld: 100 mg/dL — ABNORMAL HIGH (ref 70–99)
Potassium: 3.9 mmol/L (ref 3.5–5.1)
Sodium: 144 mmol/L (ref 135–145)
Total Bilirubin: 0.5 mg/dL (ref 0.0–1.2)
Total Protein: 7.5 g/dL (ref 6.5–8.1)

## 2024-09-30 LAB — TROPONIN T, HIGH SENSITIVITY
Troponin T High Sensitivity: 45 ng/L — ABNORMAL HIGH (ref 0–19)
Troponin T High Sensitivity: 36 ng/L — ABNORMAL HIGH (ref 0–19)

## 2024-09-30 LAB — CREATININE, SERUM
Creatinine, Ser: 1.36 mg/dL — ABNORMAL HIGH (ref 0.44–1.00)
GFR, Estimated: 40 mL/min — ABNORMAL LOW (ref >60)

## 2024-09-30 LAB — PRO BRAIN NATRIURETIC PEPTIDE: Pro Brain Natriuretic Peptide: 8501 pg/mL — ABNORMAL HIGH (ref <300.0)

## 2024-09-30 MED ORDER — BUDESON-GLYCOPYRROL-FORMOTEROL 160-9-4.8 MCG/ACT IN AERO
2.0000 | INHALATION_SPRAY | Freq: Two times a day (BID) | RESPIRATORY_TRACT | Status: DC
  Administered 2024-09-30 – 2024-10-02 (×4): 2 via RESPIRATORY_TRACT
  Filled 2024-09-30: qty 5.9

## 2024-09-30 MED ORDER — SPIRONOLACTONE 12.5 MG HALF TABLET
12.5000 mg | ORAL_TABLET | Freq: Every day | ORAL | Status: DC
  Administered 2024-10-01 – 2024-10-02 (×2): 12.5 mg via ORAL
  Filled 2024-09-30 (×3): qty 1

## 2024-09-30 MED ORDER — LORATADINE 10 MG PO TABS
10.0000 mg | ORAL_TABLET | Freq: Every day | ORAL | Status: DC
  Administered 2024-10-01 – 2024-10-02 (×2): 10 mg via ORAL
  Filled 2024-09-30 (×2): qty 1

## 2024-09-30 MED ORDER — METOPROLOL SUCCINATE ER 25 MG PO TB24
25.0000 mg | ORAL_TABLET | Freq: Every day | ORAL | Status: DC
  Administered 2024-10-01 – 2024-10-02 (×2): 25 mg via ORAL
  Filled 2024-09-30 (×2): qty 1

## 2024-09-30 MED ORDER — PANTOPRAZOLE SODIUM 40 MG PO TBEC
40.0000 mg | DELAYED_RELEASE_TABLET | Freq: Every day | ORAL | Status: DC
  Administered 2024-10-01 – 2024-10-02 (×2): 40 mg via ORAL
  Filled 2024-09-30 (×2): qty 1

## 2024-09-30 MED ORDER — BENZONATATE 100 MG PO CAPS
100.0000 mg | ORAL_CAPSULE | Freq: Once | ORAL | Status: AC
  Administered 2024-09-30: 100 mg via ORAL
  Filled 2024-09-30: qty 1

## 2024-09-30 MED ORDER — IPRATROPIUM-ALBUTEROL 0.5-2.5 (3) MG/3ML IN SOLN
3.0000 mL | Freq: Once | RESPIRATORY_TRACT | Status: AC
  Administered 2024-09-30: 3 mL via RESPIRATORY_TRACT
  Filled 2024-09-30: qty 3

## 2024-09-30 MED ORDER — FLUTICASONE PROPIONATE 50 MCG/ACT NA SUSP: 2.0000 | Freq: Every day | NASAL | Status: DC | PRN

## 2024-09-30 MED ORDER — FUROSEMIDE 10 MG/ML IJ SOLN
40.0000 mg | Freq: Once | INTRAMUSCULAR | Status: AC
  Administered 2024-09-30: 40 mg via INTRAVENOUS
  Filled 2024-09-30: qty 4

## 2024-09-30 MED ORDER — ROSUVASTATIN CALCIUM 5 MG PO TABS
5.0000 mg | ORAL_TABLET | Freq: Every day | ORAL | Status: DC
  Administered 2024-10-01 – 2024-10-02 (×2): 5 mg via ORAL
  Filled 2024-09-30 (×2): qty 1

## 2024-09-30 MED ORDER — FUROSEMIDE 10 MG/ML IJ SOLN: 40.0000 mg | Freq: Two times a day (BID) | INTRAMUSCULAR | Status: DC

## 2024-09-30 MED ORDER — BENZONATATE 100 MG PO CAPS
200.0000 mg | ORAL_CAPSULE | Freq: Three times a day (TID) | ORAL | Status: DC | PRN
  Administered 2024-10-02: 200 mg via ORAL
  Filled 2024-09-30: qty 2

## 2024-09-30 MED ORDER — FUROSEMIDE 10 MG/ML IJ SOLN
40.0000 mg | Freq: Two times a day (BID) | INTRAMUSCULAR | Status: DC
  Administered 2024-09-30 – 2024-10-02 (×4): 40 mg via INTRAVENOUS
  Filled 2024-09-30 (×4): qty 4

## 2024-09-30 MED ORDER — METHYLPREDNISOLONE SODIUM SUCC 125 MG IJ SOLR
125.0000 mg | Freq: Once | INTRAMUSCULAR | Status: AC
  Administered 2024-09-30: 125 mg via INTRAVENOUS
  Filled 2024-09-30: qty 2

## 2024-09-30 MED ORDER — DOXYCYCLINE HYCLATE 100 MG PO TABS
100.0000 mg | ORAL_TABLET | Freq: Once | ORAL | Status: AC
  Administered 2024-09-30: 100 mg via ORAL
  Filled 2024-09-30: qty 1

## 2024-09-30 MED ORDER — POLYETHYLENE GLYCOL 3350 17 G PO PACK: 17.0000 g | PACK | Freq: Every day | ORAL | Status: DC | PRN

## 2024-09-30 MED ORDER — SORBITOL 70 % SOLN: 30.0000 mL | Freq: Every day | Status: DC | PRN

## 2024-09-30 MED ORDER — ENOXAPARIN SODIUM 60 MG/0.6ML IJ SOSY
50.0000 mg | PREFILLED_SYRINGE | Freq: Every day | INTRAMUSCULAR | Status: DC
  Administered 2024-09-30 – 2024-10-01 (×2): 50 mg via SUBCUTANEOUS
  Filled 2024-09-30 (×2): qty 0.6

## 2024-09-30 MED ORDER — SACUBITRIL-VALSARTAN 24-26 MG PO TABS
1.0000 | ORAL_TABLET | Freq: Two times a day (BID) | ORAL | Status: DC
  Administered 2024-09-30 – 2024-10-02 (×4): 1 via ORAL
  Filled 2024-09-30 (×4): qty 1

## 2024-09-30 NOTE — Assessment & Plan Note (Signed)
See CHF

## 2024-09-30 NOTE — Assessment & Plan Note (Signed)
 Nightly CPAP ordered

## 2024-09-30 NOTE — Assessment & Plan Note (Signed)
 Patient's spo2 dropped to 89% on room air in the ED.  Improved on 2 L/min [place] O2. Likely due to pulmonary edema with CHF decompensation --Diuresis as above --Further evaluation if not improved with diuresis.  Low suspicion for PE at time of admission, clinically presentation more consistent with CHF.

## 2024-09-30 NOTE — Consult Note (Signed)
 Cardiology Consultation   Patient ID: ZAKYA HALABI MRN: [id number]; DOB: 16-Jan-1946  Admit date: 09/30/2024 Date of Consult: 09/30/2024  PCP:  Crystal Channel, MD   [facility] Providers Cardiologist:  Crystal Gull, MD   {     Patient Profile: Crystal Yoder is a 78 y.o. female with a hx of hypertension, nonischemic cardiomyopathy, mild pulmonary hypertension, mild to moderate aortic stenosis, GERD, hyperlipidemia, reactive airway disease, frequent PVCs, who is being seen 09/30/2024 for the evaluation of heart failure exacerbation at the request of Crystal Cunning, DO.  History of Present Illness: Crystal Yoder diagnosed with HFrEF in 2020 with an echocardiogram that showed EF 35 to 40%.  She wore Holter monitor that had shown 31% PVC burden.  Etiology suspected to be tach mediated as she had a coronary CTA that showed normal coronaries.  Did undergo right heart cath which showed mild pulmonary hypertension.  She did have improvement in her EF as PVC burden was reduced.  However in 2024 patient had cardiac MRI which showed EF of 30% with no infiltrative process noted.  She was recently seen by Dr. Gull 08/2024.  Patient at that time and reporting frequent coughing and home stress. Of note decision has been made to forego SGLT2 inhibitor use given limited mobility and infection risk as she is asplenic.  Patient followed up with pulmonology clinic after her cardiology visit.  Her chronic cough was thought to be multifactorial, suspected to be due to pulmonary edema, allergies, postnasal drip, suspected asthma and GERD.  Her medication management was optimized.  And was referred for PFTs further evaluation.  Presented to [facility] on 12/5 for chronic cough. BP: 120/83 patient did get hypoxic on room air to 86%. ECG: Sinus rhythm with RBBB.  PVC.  VR 93 CXR showed cardiomegaly and possible pulmonary edema versus atypical infection.  Pertinent lab work Cr 1.28 [around baseline] proBNP  8501 Troponin 45 -> 36  At [facility] patient received antibiotics, a breathing treatment with steroids, and 1 dose of IV Lasix  40 mg.  She reports feeling better currently. She is anxious. She is primary care giver for her husband who has alzheimer's. Son is currently helping out while she is in hospital    Past Medical History:  Diagnosis Date   Arthritis    Arthropathy, unspecified, site unspecified    COPD (chronic obstructive pulmonary disease) (HCC)    Esophageal reflux    Essential hypertension 05/31/2017   Obesity, unspecified    Obstructive sleep apnea (adult) (pediatric)    Other acute sinusitis    Other primary cardiomyopathies    Psoriasis    Shortness of breath    Unspecified venous (peripheral) insufficiency     Past Surgical History:  Procedure Laterality Date   ABDOMINAL HYSTERECTOMY  2011   BLADDER SURGERY  2011   tac=ant/post   CHOLECYSTECTOMY N/A 06/01/2017   Procedure: LAPAROSCOPIC CHOLECYSTECTOMY;  Surgeon: Crystal Locus, MD;  Location: [facility];  Service: General;  Laterality: N/A;   IRRIGATION AND DEBRIDEMENT OF WOUND WITH SPLIT THICKNESS SKIN GRAFT Right 01/25/2014   Procedure: RIGHT FOOT IRRIGATION AND DEBRIDEMENT WITH ACELL/SPLICK THICKNESS SKINGRAFT WITH VAC;  Surgeon: Crystal Reichert, DO;  Location: [facility];  Service: Plastics;  Laterality: Right;   PELVIC FLOOR REPAIR     REPLACEMENT TOTAL KNEE     rt and lt   RIGHT HEART CATH N/A 07/28/2018   Procedure: RIGHT HEART CATH;  Surgeon: Crystal Debby LABOR, MD;  Location: [facility] INVASIVE CV  LAB;  Service: Cardiovascular;  Laterality: N/A;   RIGHT HEART CATH N/A 10/07/2023   Procedure: RIGHT HEART CATH;  Surgeon: Crystal Sharper, MD;  Location: [facility] INVASIVE CV LAB;  Service: Cardiovascular;  Laterality: N/A;   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     Home Medications:  Prior to Admission medications   Medication Sig Start Date End Date Taking? Authorizing Provider  albuterol  (VENTOLIN  HFA) 108 (90  Base) MCG/ACT inhaler Inhale 2 puffs into the lungs every 4 (four) hours as needed for wheezing or shortness of breath.   Yes [provider]  dextromethorphan (DELSYM) 30 MG/5ML liquid Take by mouth as needed for cough.   Yes [provider]  fluticasone -salmeterol (ADVAIR) 250-50 MCG/ACT AEPB Inhale 1 puff into the lungs every 12 (twelve) hours. 09/19/24  Yes Baral, Dipti, MD  furosemide  (LASIX ) 20 MG tablet Take one tablet by mouth 5 days a week Monday, Wednesday, Thursday, Friday,and Sunday and take two tablets by mouth twice a week on Tuesday and Saturday 02/12/24  Yes Crystal Crystal GAILS, MD  guaiFENesin (MUCINEX) 600 MG 12 hr tablet Take 600 mg by mouth as needed.   Yes [provider]  loratadine  (CLARITIN ) 10 MG tablet Take 10 mg by mouth daily.   Yes [provider]  metoprolol  succinate (TOPROL -XL) 25 MG 24 hr tablet TAKE 1 TABLET(25 MG) BY MOUTH DAILY 09/29/24  Yes Crystal Crystal GAILS, MD  potassium chloride  SA (KLOR-CON  M) 20 MEQ tablet TAKE 1 TABLET(20 MEQ) BY MOUTH EVERY OTHER DAY ON THE SAME DAY AS LASIX  40 MG 05/24/24  Yes Crystal Crystal GAILS, MD  rosuvastatin  (CRESTOR ) 5 MG tablet TAKE 1 TABLET(5 MG) BY MOUTH DAILY 09/29/24  Yes Crystal Crystal GAILS, MD  sacubitril -valsartan  (ENTRESTO ) 24-26 MG Take 1 tablet by mouth 2 (two) times daily. 03/18/24  Yes Crystal Crystal GAILS, MD  spironolactone  (ALDACTONE ) 25 MG tablet TAKE 1/2 TABLET(12.5 MG) BY MOUTH DAILY 09/19/24  Yes Crystal Crystal GAILS, MD  ascorbic acid (VITAMIN C) 100 MG tablet Take 1 tablet by mouth as needed.    [provider]  Cholecalciferol 25 MCG (1000 UT) capsule Take 1,000 Units by mouth as needed.    [provider]  clotrimazole-betamethasone (LOTRISONE) cream as needed.    [provider]  colchicine  0.6 MG tablet Take 1 tablet (0.6 mg total) by mouth 2 (two) times daily. 10/08/23   Crystal Crystal GAILS, MD  fluticasone  (FLONASE ) 50 MCG/ACT nasal spray Place 2 sprays into both nostrils daily as needed  for allergies. 11/13/22   [provider]  Fluticasone -Umeclidin-Vilant (TRELEGY ELLIPTA ) 100-62.5-25 MCG/ACT AEPB Inhale 1 puff into the lungs daily. 09/19/24   Baral, Dipti, MD  ipratropium-albuterol  (DUONEB) 0.5-2.5 (3) MG/3ML SOLN Take 3 mLs by nebulization every 6 (six) hours as needed. 09/19/24   Baral, Dipti, MD  Melatonin 10 MG TABS Take 20 mg by mouth at bedtime as needed (sleep).    [provider]  pantoprazole  (PROTONIX ) 40 MG tablet Take 40 mg by mouth daily.    [provider]  Quercetin 250 MG TABS Take 500 mg by mouth as needed.    [provider]    Scheduled Meds:  Continuous Infusions:  PRN Meds:   Allergies:    Allergies  Allergen Reactions   Penicillins Hives and Other (See Comments)    Has patient had a PCN reaction causing immediate rash, facial/tongue/throat swelling, SOB or lightheadedness with hypotension: No Has patient had a PCN reaction causing severe rash  involving mucus membranes or skin necrosis: No Has patient had a PCN reaction that required hospitalization: No Has patient had a PCN reaction occurring within the last 10 years: No If all of the above answers are NO, then may proceed with Cephalosporin use.   Sulfa Antibiotics Diarrhea, Nausea And Vomiting and Other (See Comments)    Other Reaction: GI Upset   Sulfonamide Derivatives Diarrhea and Nausea And Vomiting    Social History:   Social History   Socioeconomic History   Marital status: Married    Spouse name: Not on file   Number of children: Not on file   Years of education: Not on file   Highest education level: Not on file  Occupational History   Occupation: Engineer, site  Tobacco Use   Smoking status: Former    Current packs/day: 0.00    Average packs/day: 0.5 packs/day for 10.0 years (5.0 ttl pk-yrs)    Types: Cigarettes    Start date: 10/27/1969    Quit date: 10/28/1979    Years since quitting: 44.9   Smokeless tobacco: Never  Vaping Use    Vaping status: Never Used  Substance and Sexual Activity   Alcohol use: Yes    Comment: rare   Drug use: No   Sexual activity: Not on file  Other Topics Concern   Not on file  Social History Narrative   Not on file   Social Drivers of Health   Financial Resource Strain: Medium Risk (11/08/2021)   Overall Financial Resource Strain (CARDIA)    Difficulty of Paying Living Expenses: Somewhat hard  Food Insecurity: Low Risk  (06/19/2023)   Received from [facility]   Hunger Vital Sign    Within the past 12 months, you worried that your food would run out before you got money to buy more: Never true    Within the past 12 months, the food you bought just didn't last and you didn't have money to get more. : Never true  Transportation Needs: No Transportation Needs (06/19/2023)   Received from [facility]    In the past 12 months, has lack of reliable transportation kept you from medical appointments, meetings, work or from getting things needed for daily living? : No  Physical Activity: Not on file  Stress: Stress Concern Present (10/31/2022)   [employer] of Occupational [employer] - Occupational Stress Questionnaire    Feeling of Stress : Very much  Social Connections: Not on file  Intimate Partner Violence: Not on file    Family History:   Family History  Problem Relation Age of Onset   Prostate cancer Father    Diverticulitis Father    Diabetes Paternal Grandfather      ROS:  Please see the history of present illness.  All other ROS reviewed and negative.     Physical Exam/Data: Vitals:   09/30/24 1317 09/30/24 1403 09/30/24 1715  BP: 120/83    Pulse: 69    Resp: 16    Temp: 97.7 F (36.5 C)  98 F (36.7 C)  TempSrc: Oral  Oral  SpO2: 98% (!) 89%    No intake or output data in the 24 hours ending 09/30/24 1837    09/19/2024    1:31 PM 09/02/2024   11:53 AM 03/23/2024    1:27 PM  Last 3 Weights  Weight (lbs) 230 lb 230 lb 234 lb  Weight (kg)  104.327 kg 104.327 kg 106.142 kg     There is no  height or weight on file to calculate BMI.   Anxious elderly female  Overweight Basilar crackles JVP elevate with V wave No murmur Abdomen benign Plus one edema   Telemetry:  Telemetry was personally reviewed and demonstrates:  SR with PVCls   Relevant CV Studies: [facility]HC 09/2023    Hemodynamic findings consistent with mild pulmonary hypertension.   Hemodynamic findings:   RA mean 6 mmHg RV 49 over 11 mmHg PA 52/24 mmHg with a mean PA pressure of 32 mmHg Pulmonary wedge pressure of 11 mmHg, A-wave 15, V wave 13 mmHg   Cardiac output 6.6 L/min Cardiac index 2.9 L/min/m   Final conclusion: Mild pulmonary hypertension with PVR 3.2 Wood units  cMR 10/2022 IMPRESSION: 1. Moderately reduced left ventricular systolic function with mild LV dilation. LVEF 36%. Global hypokinesis with moderate hypokinesis of the basal to mid inferior wall and severe hypokinesis of the basal to apical inferoseptum.   2. Mildly reduced right ventricular systolic function with normal RV size. RVEF 37%.   3. No delayed myocardial enhancement. ECV 30%. No definite myocardial edema. No evidence of scar, fibrosis, inflammation, infarction or infiltrative cardiomyopathic process.   4.  Severe dilation of main pulmonary artery.     Laboratory Data: Recent Labs  Lab 09/30/24 1413 09/30/24 1638  TRNPT 45* 36*    Chemistry Recent Labs  Lab 09/30/24 1413  NA 144  K 3.9  CL 107  CO2 27  GLUCOSE 100*  BUN 19  CREATININE 1.28*  CALCIUM  9.6  GFRNONAA 42*  ANIONGAP 11    Recent Labs  Lab 09/30/24 1413  PROT 7.5  ALBUMIN 3.9  AST 22  ALT 10  ALKPHOS 92  BILITOT 0.5   Hematology Recent Labs  Lab 09/30/24 1350  WBC 8.9  RBC 4.32  HGB 12.5  HCT 40.3  MCV 93.3  MCH 28.9  MCHC 31.0  RDW 14.6  PLT 193   BNP Recent Labs  Lab 09/30/24 1413  PROBNP 8,501.0*     Radiology/Studies:  DG Chest Portable 1 View Result Date:  09/30/2024 CLINICAL DATA:  Shortness of breath.  Cough. EXAM: PORTABLE CHEST 1 VIEW COMPARISON:  Radiograph 08/21/2024, CT 09/22/2023 FINDINGS: The heart is enlarged. Stable mediastinal contours. Hilar prominence secondary to enlarged pulmonary arteries on CT grade interstitial coarsening is new from prior exam. No focal airspace disease. No pleural effusion or pneumothorax. On limited assessment, no acute osseous findings. IMPRESSION: 1. Cardiomegaly. 2. Interstitial coarsening is new from prior exam, may represent pulmonary edema or atypical infection. Electronically Signed   By: Andrea Gasman M.D.   On: 09/30/2024 14:24     Assessment and Plan:  Acute on chronic HFrEF Non ischemic DCM EF 36% by MRI with no GAD uptake   PTA entresto  24-25 mg BID, metoprolol  succinate 25 mg, spironolactone  25 mg Outpatient decision to defer SGLT2i use obese with risk yeast infection/UTI  Start Lasix  40 mg iv bid while in hospital. Follow K/Cr  Her clinical presentation is multi factorial with bronchitis/GERD causing most of cough  Pulmonary hypertension Per most recent RHC 2024 mild pulmonary hypertension Patient does have obstructive sleep apnea sleep apnea, CPAP   Hypertension BP: 120/83 Medications as above  Hyperlipidemia Lipid panel pending for am Continue crestor  5 mg  Bifascicular Block ECG with no acute changes RBBB/LAFB  Maude Emmer MD [facility]

## 2024-09-30 NOTE — Assessment & Plan Note (Signed)
 Continue Crestor

## 2024-09-30 NOTE — ED Triage Notes (Signed)
 Pt has had had a chronic cough for over a month and has been seen by pulmonology for this and placed on nebulizers.  Pt continues to have cough that is productive for clear phlegm.  Pt has been feeling generally unwell since the beginning of October

## 2024-09-30 NOTE — Assessment & Plan Note (Signed)
 Patient presents with worsening of her chronic cough, productive of whitish phlegm, dyspnea on exertion.  proBNP elevated above 8500.  Known history of cardiomyopathy with last echo in November 2023 showing EF 40 to 45%, grade 2 diastolic dysfunction.  Chest x-ray showing interstitial edema versus atypical infection.  Patient without any other infectious symptoms, most likely edema.   --Admit for observation on telemetry unit --Continue diuresis with IV Lasix  40 twice daily --Cardiology consulted, appreciate recommendations --Repeat echocardiogram pending --Strict I/O's and daily weights -- Monitor renal function and electrolytes

## 2024-09-30 NOTE — Assessment & Plan Note (Signed)
 Continue PPI.

## 2024-09-30 NOTE — Assessment & Plan Note (Signed)
 Patient has had chronic cough since early October.  Has seen pulmonology who felt patient's cough due to CHF/pulmonary edema, allergic rhinitis with postnasal drip, GERD and possibly cough variant asthma.  Suspect cough is acutely worse in the setting of CHF decompensation. --Tessalon  Perles as needed --Diuresis as above

## 2024-09-30 NOTE — Telephone Encounter (Signed)
 FYI Only or Action Required?: FYI only for provider: ED advised.  Patient is followed in Pulmonology for chronic cough, last seen on 09/19/2024 by Pleas Newborn, MD.  Called Nurse Triage reporting Shortness of Breath.  Symptoms began several days ago.  Interventions attempted: Rescue inhaler and Maintenance inhaler.  Symptoms are: gradually worsening.  Triage Disposition: Go to ED Now (Notify PCP)  Patient/caregiver understands and will follow disposition?: Yes  Copied from CRM [id number]. Topic: Clinical - Red Word Triage >> Sep 30, 2024 10:05 AM Leila C wrote: Red Word that prompted transfer to Nurse Triage: Patient [phone] or [phone] wants to see if she needs to see Dr. Pleas, patient is coughing still and hard to describe, shortness of breath, ears congestion, moving slower to move around and sounds like a baby bear in chest started today. Patient denies pain, wheezing, fever, sore throat, dizziness. Please advise. Reason for Disposition  [1] MODERATE difficulty breathing (e.g., speaks in phrases, SOB even at rest, pulse 100-120) AND [2] NEW-onset or WORSE than normal  Answer Assessment - Initial Assessment Questions Pt trying to give full history. Says she has a cough, not new and also saw an ENT bc her ears were stopped up. PCP started her on prednisone which helped. Cold air makes it worse. She was trying to find names of medications so got up to look. Few steps and RN could hear her moaning. RN asked her if it was from pain or that was a sound she was making because she was short of breath. RN advised pt to go to the ER. Pt began crying. Husband has alzheimers and son is there to help but he is sick right now. RN advised could call 911. She states no, she has a friend who can take her, she just doesn't want to go. Started crying again and states crying makes it worse. RN apologized and try to get pt to take some slow deep breaths. She is agreeable to go but is still upset. She  said she was going to take a shower first. RN advised against that since she is so short of breath with only a few steps. Pt stated understanding. RN did not continue triage questions one advised to go to ER.  Also pt during the conversation states she does not have a nebulizer machine.       1. RESPIRATORY STATUS: Describe your breathing? (e.g., wheezing, shortness of breath, unable to speak, severe coughing)      Shortness of breath, chronic cough.    4. SEVERITY: How bad is your breathing? (e.g., mild, moderate, severe)      Moderate- sounds worse with exertion.  Protocols used: Breathing Difficulty-A-AH

## 2024-09-30 NOTE — Assessment & Plan Note (Signed)
 QTc 591 on EKG in the ED --Avoid Zofran  and other QT prolonging meds --Telemetry --Monitor and replace electrolytes

## 2024-09-30 NOTE — Treatment Plan (Signed)
 Patient admitted with history of nonischemic cardiomyopathy, essential hypertension, aortic stenosis, GERD, hyperlipidemia and reactive airway disease.  Patient is admitted with acute exacerbation of CHF.  Patient arrived from [facility].  Already admitted and seen by cardiology due to admission.  Patient appears to be hemodynamically stable.  Will be optimizing treatment.  Currently on Lasix  40 twice daily monitoring creatinine.  Previous EF 5%.  Continue care according to original admission.

## 2024-09-30 NOTE — H&P (Addendum)
 Telemedicine History and Physical    Patient: Crystal Yoder FMW:[id number] DOB: [birth year] DOA: 09/30/2024 DOS: the patient was seen and examined on 09/30/2024 PCP: Teresa Channel, MD   Referring Provider: Aleck Sheer, PA-C Telemedicine Provider: Burnard Cunning, DO  Patient Location: [facility] Referring Diagnosis: CHF decompensation Patient Name and DOB verified: Crystal Yoder, 04/02/1946 Patient consented to Telemedicine Evaluation:Yes RN virtual assistant: Grayce Sayres, Paramedic Video encounter time and date: 09/30/2024 3:59 PM   Patient coming from: Home  Chief Complaint:  Chief Complaint  Patient presents with   URI   HPI: Crystal Yoder is a 78 y.o. female with medical history significant of  hypertension, pulmonary hypertension, cardiomyopathy, GERD, hyperlipidemia, and reactive airway disease who presented to [facility] ED for evaluation of shortness of breath and acute worsening of chronic cough.  Pt reports cough since early October for which she has seen Pulmonology and was started on Advair and Duoneb treatments every 6 hours.  Pt notes no improvement in cough with this. Pt reports being on medications for reflux and allergies as well, these have not changed her cough.  Cough worse going outside especially in cold air, and when she talks a lot.  Cough is mostly dry, at times productive of whitish phlegm.  Pt notes dyspnea is worse with exertion and feels like CHF is playing into her current symptoms.    ED course --  Initial vitals -- temp 97.7 F, HR 69, RR 16, BP 120/83, spO2 dropped to 89% on room air.   Labs obtained included CMP and CBC were notable for Cr 1.28.  CBC was normal.  Pro-BNP elevated at 8501.  Troponin minimally elevated at 45. Imaging -- portable CXR showed cardiomegaly and interstitial coarsening new from prior exam suggestive of pulmonary edema versus atypical infection.   Patient was treated in the ED with 40 mg IV Lasix , Duoneb, 125 mg IV  Solu-medrol , oral Doxycycline , Tessalon  and is being admitted to the hospital for further evaluation and management of acute on chronic combined CHF as outlined in detail below.    Review of Systems: As mentioned in the history of present illness. All other systems reviewed and are negative.   Past Medical History:  Diagnosis Date   Arthritis    Arthropathy, unspecified, site unspecified    COPD (chronic obstructive pulmonary disease) (HCC)    Esophageal reflux    Essential hypertension 05/31/2017   Obesity, unspecified    Obstructive sleep apnea (adult) (pediatric)    Other acute sinusitis    Other primary cardiomyopathies    Psoriasis    Shortness of breath    Unspecified venous (peripheral) insufficiency    Past Surgical History:  Procedure Laterality Date   ABDOMINAL HYSTERECTOMY  2011   BLADDER SURGERY  2011   tac=ant/post   CHOLECYSTECTOMY N/A 06/01/2017   Procedure: LAPAROSCOPIC CHOLECYSTECTOMY;  Surgeon: Tanda Locus, MD;  Location: [facility];  Service: General;  Laterality: N/A;   IRRIGATION AND DEBRIDEMENT OF WOUND WITH SPLIT THICKNESS SKIN GRAFT Right 01/25/2014   Procedure: RIGHT FOOT IRRIGATION AND DEBRIDEMENT WITH ACELL/SPLICK THICKNESS SKINGRAFT WITH VAC;  Surgeon: Estefana Reichert, DO;  Location: [facility];  Service: Plastics;  Laterality: Right;   PELVIC FLOOR REPAIR     REPLACEMENT TOTAL KNEE     rt and lt   RIGHT HEART CATH N/A 07/28/2018   Procedure: RIGHT HEART CATH;  Surgeon: Burnard Debby LABOR, MD;  Location[facility] INVASIVE CV LAB;  Service: Cardiovascular;  Laterality:  N/A;   RIGHT HEART CATH N/A 10/07/2023   Procedure: RIGHT HEART CATH;  Surgeon: Wonda Sharper, MD;  Location: [facility] INVASIVE CV LAB;  Service: Cardiovascular;  Laterality: N/A;   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 Her smoking use included cigarettes. She started smoking about 54 years ago. She has a 5 pack-year smoking  history. She has never used smokeless tobacco. She reports current alcohol use. She reports that she does not use drugs.  Allergies  Allergen Reactions   Penicillins Hives and Other (See Comments)    Has patient had a PCN reaction causing immediate rash, facial/tongue/throat swelling, SOB or lightheadedness with hypotension: No Has patient had a PCN reaction causing severe rash involving mucus membranes or skin necrosis: No Has patient had a PCN reaction that required hospitalization: No Has patient had a PCN reaction occurring within the last 10 years: No If all of the above answers are NO, then may proceed with Cephalosporin use.   Sulfa Antibiotics Diarrhea, Nausea And Vomiting and Other (See Comments)    Other Reaction: GI Upset   Sulfonamide Derivatives Diarrhea and Nausea And Vomiting    Family History  Problem Relation Age of Onset   Prostate cancer Father    Diverticulitis Father    Diabetes Paternal Grandfather     Prior to Admission medications   Medication Sig Start Date End Date Taking? Authorizing Provider  ascorbic acid (VITAMIN C) 100 MG tablet Take 1 tablet by mouth as needed.    [provider]  Cholecalciferol 25 MCG (1000 UT) capsule Take 1,000 Units by mouth as needed.    [provider]  clotrimazole-betamethasone (LOTRISONE) cream as needed.    [provider]  colchicine  0.6 MG tablet Take 1 tablet (0.6 mg total) by mouth 2 (two) times daily. 10/08/23   Okey Vina GAILS, MD  fluticasone  (FLONASE ) 50 MCG/ACT nasal spray Place 2 sprays into both nostrils daily as needed for allergies. 11/13/22   [provider]  fluticasone -salmeterol (ADVAIR) 250-50 MCG/ACT AEPB Inhale 1 puff into the lungs every 12 (twelve) hours. 09/19/24   Baral, Dipti, MD  Fluticasone -Umeclidin-Vilant (TRELEGY ELLIPTA ) 100-62.5-25 MCG/ACT AEPB Inhale 1 puff into the lungs daily. 09/19/24   Pleas, Dipti, MD  furosemide  (LASIX ) 20 MG tablet Take one tablet by  mouth 5 days a week Monday, Wednesday, Thursday, Friday,and Sunday and take two tablets by mouth twice a week on Tuesday and Saturday 02/12/24   Okey Vina GAILS, MD  guaiFENesin (MUCINEX) 600 MG 12 hr tablet Take 600 mg by mouth as needed.    [provider]  ipratropium-albuterol  (DUONEB) 0.5-2.5 (3) MG/3ML SOLN Take 3 mLs by nebulization every 6 (six) hours as needed. 09/19/24   Baral, Dipti, MD  Melatonin 10 MG TABS Take 20 mg by mouth at bedtime as needed (sleep).    [provider]  metoprolol  succinate (TOPROL -XL) 25 MG 24 hr tablet TAKE 1 TABLET(25 MG) BY MOUTH DAILY 09/29/24   Okey Vina GAILS, MD  pantoprazole  (PROTONIX ) 40 MG tablet Take 40 mg by mouth every other day.    [provider]  potassium chloride  SA (KLOR-CON  M) 20 MEQ tablet TAKE 1 TABLET(20 MEQ) BY MOUTH EVERY OTHER DAY ON THE SAME DAY AS LASIX  40 MG 05/24/24   Okey Vina GAILS, MD  Quercetin 250 MG TABS Take 500 mg by mouth as needed.    [provider]  rosuvastatin  (CRESTOR )  5 MG tablet TAKE 1 TABLET(5 MG) BY MOUTH DAILY 09/29/24   Okey Vina GAILS, MD  sacubitril -valsartan  (ENTRESTO ) 24-26 MG Take 1 tablet by mouth 2 (two) times daily. 03/18/24   Okey Vina GAILS, MD  spironolactone  (ALDACTONE ) 25 MG tablet TAKE 1/2 TABLET(12.5 MG) BY MOUTH DAILY 09/19/24   Okey Vina GAILS, MD    Physical Exam: Vitals:   09/30/24 1317 09/30/24 1403  BP: 120/83   Pulse: 69   Resp: 16   Temp: 97.7 F (36.5 C)   TempSrc: Oral   SpO2: 98% (!) 89%   Bedside physical exam was performed by RN listed above. Below exam findings are based on their in person physical exam findings and my observations during virtual encounter.  General exam: awake, alert, no acute distress HEENT: hard of hearing Respiratory system: raspy-sounding cough, right upper lung field with expiratory wheezes, diminished in bilateral bases, normal respiratory effort at rest on 2 L/min [place] O2 Cardiovascular system: normal S1/S2, RRR, few PVC's, no  peripheral edema.   Gastrointestinal system: soft, NT, ND, +bowel sounds. Central nervous system: A&O x 3. no gross focal neurologic deficits, normal speech Extremities: moves all, no edema Psychiatry: normal mood, congruent affect, judgement and insight appear normal    Data Reviewed:  Labs and diagnostic studies are reviewed in detail above.  Assessment and Plan: * Acute on chronic combined systolic (congestive) and diastolic (congestive) heart failure (HCC) Patient presents with worsening of her chronic cough, productive of whitish phlegm, dyspnea on exertion.  proBNP elevated above 8500.  Known history of cardiomyopathy with last echo in November 2023 showing EF 40 to 45%, grade 2 diastolic dysfunction.  Chest x-ray showing interstitial edema versus atypical infection.  Patient without any other infectious symptoms, most likely edema.   --Admit for observation on telemetry unit --Continue diuresis with IV Lasix  40 twice daily --Cardiology consulted, appreciate recommendations --Repeat echocardiogram pending --Strict I/O's and daily weights -- Monitor renal function and electrolytes  Acute respiratory failure with hypoxia (HCC) Patient's spo2 dropped to 89% on room air in the ED.  Improved on 2 L/min [place] O2. Likely due to pulmonary edema with CHF decompensation --Diuresis as above --Further evaluation if not improved with diuresis.  Low suspicion for PE at time of admission, clinically presentation more consistent with CHF.    Nonischemic cardiomyopathy (HCC) See CHF  Obstructive sleep apnea Nightly CPAP ordered  GASTROESOPHAGEAL REFLUX DISEASE Continue PPI  Bronchitis, chronic (HCC) Patient has had chronic cough since early October.  Has seen pulmonology who felt patient's cough due to CHF/pulmonary edema, allergic rhinitis with postnasal drip, GERD and possibly cough variant asthma.  Suspect cough is acutely worse in the setting of CHF decompensation. --Tessalon  Perles as  needed --Diuresis as above  Hyperlipidemia Continue Crestor   Prolonged QT interval QTc 591 on EKG in the ED --Avoid Zofran  and other QT prolonging meds --Telemetry --Monitor and replace electrolytes      Advance Care Planning: Code status - DNR/DNI Discussed code status in detail with patient during virtual encounter.  Patient states would not want to undergo CPR or defibrillation in the event of cardiac arrest and would not want to be intubated for respiratory arrest.  Her friend was present at bedside during this conversation.  Consults: Cardiology  Family Communication: Friend at bedside during virtual encounter.  Severity of Illness: The appropriate patient status for this patient is OBSERVATION. Observation status is judged to be reasonable and necessary in order to provide the required intensity of service  to ensure the patient's safety. The patient's presenting symptoms, physical exam findings, and initial radiographic and laboratory data in the context of their medical condition is felt to place them at decreased risk for further clinical deterioration. Furthermore, it is anticipated that the patient will be medically stable for discharge from the hospital within 2 midnights of admission.   Author: Burnard DELENA Cunning, DO 09/30/2024 4:50 PM  For on call review [email or web address].

## 2024-09-30 NOTE — ED Provider Notes (Signed)
 [place] EMERGENCY DEPARTMENT AT [facility] Provider Note   CSN: [id number] Arrival date & time: 09/30/24  1256     Patient presents with: URI   Crystal Yoder is a 78 y.o. female with a past medical history significant for hypertension, pulmonary hypertension, cardiomyopathy, GERD, hyperlipidemia, and reactive airway disease who presents to the ED due to shortness of breath and acute on chronic cough.  Patient notes she has had a cough since the beginning of October.  Has seen pulmonology and was started on Advair and Duoneb treatments every 6 hours as needed with no improvement in cough. Also on reflux and allergy  medication. Admits to some shortness of breath, worse with exertion. Denies associated chest pain. Denies fever and chills.  Denies history of blood clots, recent surgeries, recent long immobilizations, or hormonal treatments.  Denies lower extremity edema.  History obtained from patient and past medical records. No interpreter used during encounter.      Prior to Admission medications   Medication Sig Start Date End Date Taking? Authorizing Provider  dextromethorphan (DELSYM) 30 MG/5ML liquid Take by mouth as needed for cough.   Yes [provider]  fluticasone -salmeterol (ADVAIR) 250-50 MCG/ACT AEPB Inhale 1 puff into the lungs every 12 (twelve) hours. 09/19/24  Yes Baral, Dipti, MD  furosemide  (LASIX ) 20 MG tablet Take one tablet by mouth 5 days a week Monday, Wednesday, Thursday, Friday,and Sunday and take two tablets by mouth twice a week on Tuesday and Saturday 02/12/24  Yes Okey Vina GAILS, MD  guaiFENesin (MUCINEX) 600 MG 12 hr tablet Take 600 mg by mouth as needed.   Yes [provider]  metoprolol  succinate (TOPROL -XL) 25 MG 24 hr tablet TAKE 1 TABLET(25 MG) BY MOUTH DAILY 09/29/24  Yes Okey Vina GAILS, MD  rosuvastatin  (CRESTOR ) 5 MG tablet TAKE 1 TABLET(5 MG) BY MOUTH DAILY 09/29/24  Yes Okey Vina GAILS, MD  albuterol  (VENTOLIN  HFA) 108 (90 Base)  MCG/ACT inhaler Inhale 2 puffs into the lungs every 4 (four) hours as needed for wheezing or shortness of breath.    [provider]  ascorbic acid (VITAMIN C) 100 MG tablet Take 1 tablet by mouth as needed.    [provider]  Cholecalciferol 25 MCG (1000 UT) capsule Take 1,000 Units by mouth as needed.    [provider]  clotrimazole-betamethasone (LOTRISONE) cream as needed.    [provider]  colchicine  0.6 MG tablet Take 1 tablet (0.6 mg total) by mouth 2 (two) times daily. 10/08/23   Okey Vina GAILS, MD  fluticasone  (FLONASE ) 50 MCG/ACT nasal spray Place 2 sprays into both nostrils daily as needed for allergies. 11/13/22   [provider]  Fluticasone -Umeclidin-Vilant (TRELEGY ELLIPTA ) 100-62.5-25 MCG/ACT AEPB Inhale 1 puff into the lungs daily. 09/19/24   Baral, Dipti, MD  ipratropium-albuterol  (DUONEB) 0.5-2.5 (3) MG/3ML SOLN Take 3 mLs by nebulization every 6 (six) hours as needed. 09/19/24   Baral, Dipti, MD  Melatonin 10 MG TABS Take 20 mg by mouth at bedtime as needed (sleep).    [provider]  pantoprazole  (PROTONIX ) 40 MG tablet Take 40 mg by mouth daily.    [provider]  potassium chloride  SA (KLOR-CON  M) 20 MEQ tablet TAKE 1 TABLET(20 MEQ) BY MOUTH EVERY OTHER DAY ON THE SAME DAY AS LASIX  40 MG 05/24/24   Okey Vina GAILS, MD  Quercetin 250 MG TABS Take 500 mg by mouth as needed.    [provider]  sacubitril -valsartan  (ENTRESTO ) 24-26 MG  Take 1 tablet by mouth 2 (two) times daily. 03/18/24   Okey Vina GAILS, MD  spironolactone  (ALDACTONE ) 25 MG tablet TAKE 1/2 TABLET(12.5 MG) BY MOUTH DAILY 09/19/24   Okey Vina GAILS, MD    Allergies: Penicillins, Sulfa antibiotics, and Sulfonamide derivatives    Review of Systems  Constitutional:  Negative for fever.  Respiratory:  Positive for cough and shortness of breath.   Cardiovascular:  Negative for chest pain and leg swelling.    Updated Vital Signs BP 120/83    Pulse 69   Temp 97.7 F (36.5 C) (Oral)   Resp 16   SpO2 (!) 89%   Physical Exam Vitals and nursing note reviewed.  Constitutional:      General: She is not in acute distress.    Appearance: She is not ill-appearing.  HENT:     Head: Normocephalic.  Eyes:     Pupils: Pupils are equal, round, and reactive to light.  Cardiovascular:     Rate and Rhythm: Normal rate and regular rhythm.     Pulses: Normal pulses.     Heart sounds: Normal heart sounds. No murmur heard.    No friction rub. No gallop.  Pulmonary:     Effort: Pulmonary effort is normal.     Breath sounds: Normal breath sounds.     Comments: Respirations equal and unlabored, patient able to speak in full sentences, lungs clear to auscultation bilaterally. Coughing frequently during initial evaluation.  Abdominal:     General: Abdomen is flat. There is no distension.     Palpations: Abdomen is soft.     Tenderness: There is no abdominal tenderness. There is no guarding or rebound.  Musculoskeletal:        General: Normal range of motion.     Cervical back: Neck supple.  Skin:    General: Skin is warm and dry.  Neurological:     General: No focal deficit present.     Mental Status: She is alert.  Psychiatric:        Mood and Affect: Mood normal.        Behavior: Behavior normal.     (all labs ordered are listed, but only abnormal results are displayed) Labs Reviewed  COMPREHENSIVE METABOLIC PANEL WITH GFR - Abnormal; Notable for the following components:      Result Value   Glucose, Bld 100 (*)    Creatinine, Ser 1.28 (*)    GFR, Estimated 42 (*)    All other components within normal limits  PRO BRAIN NATRIURETIC PEPTIDE - Abnormal; Notable for the following components:   Pro Brain Natriuretic Peptide 8,501.0 (*)    All other components within normal limits  TROPONIN T, HIGH SENSITIVITY - Abnormal; Notable for the following components:   Troponin T High Sensitivity 45 (*)    All other components within  normal limits  CBC WITH DIFFERENTIAL/PLATELET  TROPONIN T, HIGH SENSITIVITY    EKG: None  Radiology: DG Chest Portable 1 View Result Date: 09/30/2024 CLINICAL DATA:  Shortness of breath.  Cough. EXAM: PORTABLE CHEST 1 VIEW COMPARISON:  Radiograph 08/21/2024, CT 09/22/2023 FINDINGS: The heart is enlarged. Stable mediastinal contours. Hilar prominence secondary to enlarged pulmonary arteries on CT grade interstitial coarsening is new from prior exam. No focal airspace disease. No pleural effusion or pneumothorax. On limited assessment, no acute osseous findings. IMPRESSION: 1. Cardiomegaly. 2. Interstitial coarsening is new from prior exam, may represent pulmonary edema or atypical infection. Electronically Signed   By: Andrea Gasman  M.D.   On: 09/30/2024 14:24     Procedures   Medications Ordered in the ED  doxycycline  (VIBRA -TABS) tablet 100 mg (has no administration in time range)  benzonatate  (TESSALON ) capsule 100 mg (100 mg Oral Given 09/30/24 1412)  methylPREDNISolone  sodium succinate (SOLU-MEDROL ) 125 mg/2 mL injection 125 mg (125 mg Intravenous Given 09/30/24 1411)  ipratropium-albuterol  (DUONEB) 0.5-2.5 (3) MG/3ML nebulizer solution 3 mL (3 mLs Nebulization Given 09/30/24 1403)  furosemide  (LASIX ) injection 40 mg (40 mg Intravenous Given 09/30/24 1513)    Clinical Course as of 09/30/24 1523  Fri Sep 30, 2024  1456 SpO2(!): 89 % [CA]    Clinical Course User Index [CA] Lorelle Aleck BROCKS, PA-C                                 Medical Decision Making Amount and/or Complexity of Data Reviewed Independent Historian: friend    Details: Friend at bedside provided some history External Data Reviewed: notes.    Details: Pulmonology notes Labs: ordered. Decision-making details documented in ED Course. Radiology: ordered and independent interpretation performed. ECG/medicine tests: ordered and independent interpretation performed. Decision-making details documented in ED  Course.  Risk Prescription drug management. Decision regarding hospitalization.   This patient presents to the ED for concern of cough + SOB, this involves an extensive number of treatment options, and is a complaint that carries with it a high risk of complications and morbidity.  The differential diagnosis includes PNA, asthma/COPD, PE, ACS, etc  78 year old female presents to the ED due to shortness of breath and chronic cough.  Notes she has had a cough since October.  Is followed by pulmonology.  Denies fever and chills.  No chest pain.  Denies history of blood clots.  Upon arrival, vitals all within normal limits.  Patient is afebrile, not tachycardic or hypoxic.  Patient coughing frequently during initial evaluation.  Lungs clear to auscultation bilaterally.  Low suspicion for PE/DVT.  Routine labs ordered.  BNP to rule out CHF.  Chest x-ray to rule out evidence of pneumonia.  Tessalon  Perles given. Solumedrol and Duoneb treatment given.  Pulmonology felt chronic cough is likely related to pulmonary edema, allergies, GERD, postnasal drip and possible cough variant asthma.  CBC with no leukocytosis.  Normal hemoglobin.  CMP with slight elevation in creatinine at 1.28.  No major electrolyte derangements.  BNP elevated at 8500.  Chest x-ray personally reviewed and interpreted which demonstrates cardiomegaly and interstitial coarsening which is new from prior chest x-ray which could represent pulmonary edema versus atypical infection.  Given worsening cough and shortness of breath will cover with doxycycline  for atypical infection.  Patient had an episode of desaturation to 89% on room air.  Given episode of hypoxia and elevated BNP patient will require admission for possible CHF exacerbation.  Lower suspicion for PE however, if patient does not improve after IV diuresis patient can be worked up further during her admission.  Discussed with Dr. Charlyn who evaluated patient at bedside and agrees with  assessment and plan.  Co morbidities that complicate the patient evaluation  CHF, COPD, HTN Cardiac Monitoring: / EKG:  The patient was maintained on a cardiac monitor.  I personally viewed and interpreted the cardiac monitored which showed an underlying rhythm of: NSR  Social Determinants of Health:  Elderly >65  Test / Admission - Considered:  Admit for CHF exacerbation   3:22 PM Discussed with Dr. Fausto with [facility]  who agrees to admit patient     Final diagnoses:  Acute on chronic congestive heart failure, unspecified heart failure type [facility])  Acute cough    ED Discharge Orders     None          Jamesen Stahnke C, PA-C 09/30/24 1529    Charlyn Sora, MD 10/01/24 [phone]

## 2024-09-30 NOTE — ED Notes (Signed)
 Called [facility] for transport, pt bed assignment is ready

## 2024-10-01 ENCOUNTER — Observation Stay (HOSPITAL_COMMUNITY)

## 2024-10-01 DIAGNOSIS — R059 Cough, unspecified: Secondary | ICD-10-CM | POA: Diagnosis not present

## 2024-10-01 DIAGNOSIS — I5021 Acute systolic (congestive) heart failure: Secondary | ICD-10-CM | POA: Diagnosis not present

## 2024-10-01 DIAGNOSIS — I5043 Acute on chronic combined systolic (congestive) and diastolic (congestive) heart failure: Secondary | ICD-10-CM | POA: Diagnosis not present

## 2024-10-01 LAB — ECHOCARDIOGRAM COMPLETE
AR max vel: 1.51 cm2
AV Area VTI: 1.38 cm2
AV Area mean vel: 1.51 cm2
AV Mean grad: 6 mmHg
AV Peak grad: 12.1 mmHg
Ao pk vel: 1.74 m/s
Area-P 1/2: 5.79 cm2
Height: 67 in
S' Lateral: 4.4 cm
Weight: 3583.8 [oz_av]

## 2024-10-01 LAB — LIPID PANEL
Cholesterol: 138 mg/dL (ref 0–200)
HDL: 58 mg/dL (ref >40)
LDL Cholesterol: 72 mg/dL (ref 0–99)
Total CHOL/HDL Ratio: 2.4 ratio
Triglycerides: 42 mg/dL (ref <150)
VLDL: 8 mg/dL (ref 0–40)

## 2024-10-01 LAB — BASIC METABOLIC PANEL WITH GFR
Anion gap: 8 (ref 5–15)
BUN: 24 mg/dL — ABNORMAL HIGH (ref 8–23)
CO2: 28 mmol/L (ref 22–32)
Calcium: 8.7 mg/dL — ABNORMAL LOW (ref 8.9–10.3)
Chloride: 108 mmol/L (ref 98–111)
Creatinine, Ser: 1.38 mg/dL — ABNORMAL HIGH (ref 0.44–1.00)
GFR, Estimated: 39 mL/min — ABNORMAL LOW (ref >60)
Glucose, Bld: 170 mg/dL — ABNORMAL HIGH (ref 70–99)
Potassium: 4.1 mmol/L (ref 3.5–5.1)
Sodium: 144 mmol/L (ref 135–145)

## 2024-10-01 LAB — MAGNESIUM: Magnesium: 2 mg/dL (ref 1.7–2.4)

## 2024-10-01 MED ORDER — HYDROXYZINE HCL 25 MG PO TABS
25.0000 mg | ORAL_TABLET | Freq: Once | ORAL | Status: AC | PRN
  Administered 2024-10-01: 25 mg via ORAL
  Filled 2024-10-01: qty 1

## 2024-10-01 MED ORDER — ALBUTEROL SULFATE (2.5 MG/3ML) 0.083% IN NEBU
2.5000 mg | INHALATION_SOLUTION | RESPIRATORY_TRACT | Status: DC | PRN
  Administered 2024-10-02: 2.5 mg via RESPIRATORY_TRACT
  Filled 2024-10-01: qty 3

## 2024-10-01 NOTE — Plan of Care (Signed)

## 2024-10-01 NOTE — Care Management Obs Status (Signed)
 MEDICARE OBSERVATION STATUS NOTIFICATION   Patient Details  Name: Crystal Yoder MRN: [id number] Date of Birth: [birth year]   Medicare Observation Status Notification Given:  Yes    Tom-Johnson, Harvest Muskrat, RN 10/01/2024, 1:52 PM

## 2024-10-01 NOTE — Progress Notes (Signed)
 PROGRESS NOTE    Crystal Yoder  FMW:[id number] DOB: 09/27/1946 DOA: 09/30/2024 PCP: Teresa Channel, MD  78/F, former smoker with history of chronic systolic CHF, emphysema, pulmonary hypertension, presented to the ED with ongoing cough, some shortness of breath, recently saw pulmonary and was started on ICS/LABA and as needed albuterol , scheduled for PFTs.  Also taking medications for reflux and allergies which have not helped her cough. -In the ED afebrile, O2 sats dropped to 89%, placed on 2 L O2, creatinine 1.2, proBNP elevated at 05/31/2000, troponin 45, chest x-ray noted cardiomegaly and interstitial coarsening new from prior exam suggestive of pulmonary edema versus atypical infection  Subjective: Feels better overall, cough is improving  Assessment and Plan:   Acute on chronic combined CHF -Ongoing productive cough, mild dyspnea, significantly elevated BNP, chest x-ray with?  Interstitial edema  - Suspect cough is multifactorial  -Last echo 11/23 noted EF of 40%, grade 2 DD - Continue IV Lasix  today, switch to oral diuretics tomorrow - Resume Entresto  Aldactone  and metoprolol   Acute respiratory failure with hypoxia (HCC) - Secondary to above, former smoker with emphysema on last CT in 2024, suspect underlying undiagnosed COPD as well - Continue ICS/LABA, albuterol  as needed - Check procalcitonin  Obstructive sleep apnea Nightly CPAP ordered  GASTROESOPHAGEAL REFLUX DISEASE Continue PPI  Bronchitis, chronic (HCC) Patient has had chronic cough since early October.  Has seen pulmonology who felt patient's cough due to CHF/pulmonary edema, allergic rhinitis with postnasal drip, GERD and possibly cough variant asthma.  Suspect cough is acutely worse in the setting of CHF decompensation. --Tessalon  Perles as needed --Diuresis as above  Hyperlipidemia Continue Crestor   Prolonged QT interval QTc 591 on EKG in the ED --Avoid Zofran  and other QT prolonging  meds --Telemetry --Monitor and replace electrolytes  DVT prophylaxis: Lovenox  Code Status: DNR Family Communication: None present Disposition Plan: Home likely tomorrow  Consultants:    Procedures:   Antimicrobials:    Objective: Vitals:   09/30/24 2312 10/01/24 0003 10/01/24 0300 10/01/24 0749  BP:  (!) 123/58 121/83 112/78  Pulse: 79 70 72   Resp: (!) 23 19 20    Temp:  98.1 F (36.7 C) 98.2 F (36.8 C) 98.3 F (36.8 C)  TempSrc:  Oral Oral Oral  SpO2: 94% 92% 97%   Weight:   101.6 kg   Height:   5' 7 (1.702 m)     Intake/Output Summary (Last 24 hours) at 10/01/2024 1000 Last data filed at 10/01/2024 0300 Gross per 24 hour  Intake --  Output 750 ml  Net -750 ml   Filed Weights   10/01/24 0300  Weight: 101.6 kg    Examination:  General exam: Appears calm and comfortable  Respiratory system: Few scattered rhonchi otherwise clear Cardiovascular system: S1 & S2 heard, RRR.  Abd: nondistended, soft and nontender.Normal bowel sounds heard. Central nervous system: Alert and oriented. No focal neurological deficits. Extremities: no edema Skin: No rashes Psychiatry:  Mood & affect appropriate.     Data Reviewed:   CBC: Recent Labs  Lab 09/30/24 1350 09/30/24 2119  WBC 8.9 8.6  NEUTROABS 6.1  --   HGB 12.5 12.3  HCT 40.3 39.5  MCV 93.3 93.2  PLT 193 195   Basic Metabolic Panel: Recent Labs  Lab 09/30/24 1413 09/30/24 2119 10/01/24 0243  NA 144  --  144  K 3.9  --  4.1  CL 107  --  108  CO2 27  --  28  GLUCOSE 100*  --  170*  BUN 19  --  24*  CREATININE 1.28* 1.36* 1.38*  CALCIUM  9.6  --  8.7*  MG  --   --  2.0   GFR: Estimated Creatinine Clearance: 40.5 mL/min (A) (by C-G formula based on SCr of 1.38 mg/dL (H)). Liver Function Tests: Recent Labs  Lab 09/30/24 1413  AST 22  ALT 10  ALKPHOS 92  BILITOT 0.5  PROT 7.5  ALBUMIN 3.9   No results for input(s): LIPASE, AMYLASE in the last 168 hours. No results for input(s):  AMMONIA in the last 168 hours. Coagulation Profile: No results for input(s): INR, PROTIME in the last 168 hours. Cardiac Enzymes: No results for input(s): CKTOTAL, CKMB, CKMBINDEX, TROPONINI in the last 168 hours. BNP (last 3 results) Recent Labs    09/02/24 1304 09/19/24 1446 09/30/24 1413  PROBNP 2,168* 497.0* 8,501.0*   HbA1C: No results for input(s): HGBA1C in the last 72 hours. CBG: No results for input(s): GLUCAP in the last 168 hours. Lipid Profile: Recent Labs    10/01/24 0243  CHOL 138  HDL 58  LDLCALC 72  TRIG 42  CHOLHDL 2.4   Thyroid  Function Tests: No results for input(s): TSH, T4TOTAL, FREET4, T3FREE, THYROIDAB in the last 72 hours. Anemia Panel: No results for input(s): VITAMINB12, FOLATE, FERRITIN, TIBC, IRON, RETICCTPCT in the last 72 hours. Urine analysis:    Component Value Date/Time   COLORURINE YELLOW 05/30/2017 2339   APPEARANCEUR HAZY (A) 05/30/2017 2339   LABSPEC 1.021 05/30/2017 2339   PHURINE 6.0 05/30/2017 2339   GLUCOSEU NEGATIVE 05/30/2017 2339   HGBUR NEGATIVE 05/30/2017 2339   BILIRUBINUR NEGATIVE 05/30/2017 2339   KETONESUR 20 (A) 05/30/2017 2339   PROTEINUR NEGATIVE 05/30/2017 2339   UROBILINOGEN 0.2 08/20/2010 1159   NITRITE POSITIVE (A) 05/30/2017 2339   LEUKOCYTESUR MODERATE (A) 05/30/2017 2339   Sepsis Labs: @LABRCNTIP (procalcitonin:4,lacticidven:4)  )No results found for this or any previous visit (from the past 240 hours).   Radiology Studies: DG Chest Portable 1 View Result Date: 09/30/2024 CLINICAL DATA:  Shortness of breath.  Cough. EXAM: PORTABLE CHEST 1 VIEW COMPARISON:  Radiograph 08/21/2024, CT 09/22/2023 FINDINGS: The heart is enlarged. Stable mediastinal contours. Hilar prominence secondary to enlarged pulmonary arteries on CT grade interstitial coarsening is new from prior exam. No focal airspace disease. No pleural effusion or pneumothorax. On limited assessment, no acute  osseous findings. IMPRESSION: 1. Cardiomegaly. 2. Interstitial coarsening is new from prior exam, may represent pulmonary edema or atypical infection. Electronically Signed   By: Andrea Gasman M.D.   On: 09/30/2024 14:24     Scheduled Meds:  budesonide -glycopyrrolate -formoterol   2 puff Inhalation BID   enoxaparin  (LOVENOX ) injection  50 mg Subcutaneous QHS   furosemide   40 mg Intravenous BID   loratadine   10 mg Oral Daily   metoprolol  succinate  25 mg Oral Daily   pantoprazole   40 mg Oral Daily   rosuvastatin   5 mg Oral Daily   sacubitril -valsartan   1 tablet Oral BID   spironolactone   12.5 mg Oral Daily   Continuous Infusions:   LOS: 0 days    Time spent:    Sigurd Pac, MD [facility]   10/01/2024, 10:00 AM

## 2024-10-01 NOTE — Progress Notes (Signed)
 Cardiologist:  Okey  Subjective:   Still coughing ? Aspiration risk while eating in bed with GERD   Objective:  Vitals:   09/30/24 2312 10/01/24 0003 10/01/24 0300 10/01/24 0749  BP:  (!) 123/58 121/83 112/78  Pulse: 79 70 72   Resp: (!) 23 19 20    Temp:  98.1 F (36.7 C) 98.2 F (36.8 C) 98.3 F (36.8 C)  TempSrc:  Oral Oral Oral  SpO2: 94% 92% 97%   Weight:   101.6 kg   Height:   5' 7 (1.702 m)     Intake/Output from previous day:  Intake/Output Summary (Last 24 hours) at 10/01/2024 9061 Last data filed at 10/01/2024 0300 Gross per 24 hour  Intake --  Output 750 ml  Net -750 ml    Physical Exam: Anxious elderly female  Overweight Basilar crackles JVP elevate with V wave SEM murmur Abdomen benign Plus one edema   Lab Results: Basic Metabolic Panel: Recent Labs    09/30/24 1413 09/30/24 2119 10/01/24 0243  NA 144  --  144  K 3.9  --  4.1  CL 107  --  108  CO2 27  --  28  GLUCOSE 100*  --  170*  BUN 19  --  24*  CREATININE 1.28* 1.36* 1.38*  CALCIUM  9.6  --  8.7*  MG  --   --  2.0   Liver Function Tests: Recent Labs    09/30/24 1413  AST 22  ALT 10  ALKPHOS 92  BILITOT 0.5  PROT 7.5  ALBUMIN 3.9    CBC: Recent Labs    09/30/24 1350 09/30/24 2119  WBC 8.9 8.6  NEUTROABS 6.1  --   HGB 12.5 12.3  HCT 40.3 39.5  MCV 93.3 93.2  PLT 193 195   Recent Labs    10/01/24 0243  CHOL 138  HDL 58  LDLCALC 72  TRIG 42  CHOLHDL 2.4     Imaging: DG Chest Portable 1 View Result Date: 09/30/2024 CLINICAL DATA:  Shortness of breath.  Cough. EXAM: PORTABLE CHEST 1 VIEW COMPARISON:  Radiograph 08/21/2024, CT 09/22/2023 FINDINGS: The heart is enlarged. Stable mediastinal contours. Hilar prominence secondary to enlarged pulmonary arteries on CT grade interstitial coarsening is new from prior exam. No focal airspace disease. No pleural effusion or pneumothorax. On limited assessment, no acute osseous findings. IMPRESSION: 1. Cardiomegaly. 2.  Interstitial coarsening is new from prior exam, may represent pulmonary edema or atypical infection. Electronically Signed   By: Andrea Gasman M.D.   On: 09/30/2024 14:24    Cardiac Studies:  ECG: SR RBBB / LAFB   Telemetry:  NSR   Echo: pending   Medications:    budesonide -glycopyrrolate -formoterol   2 puff Inhalation BID   enoxaparin  (LOVENOX ) injection  50 mg Subcutaneous QHS   furosemide   40 mg Intravenous BID   loratadine   10 mg Oral Daily   metoprolol  succinate  25 mg Oral Daily   pantoprazole   40 mg Oral Daily   rosuvastatin   5 mg Oral Daily   sacubitril -valsartan   1 tablet Oral BID   spironolactone   12.5 mg Oral Daily      Assessment/Plan:  Crystal Yoder is a 78 y.o. female with a hx of hypertension, nonischemic cardiomyopathy, mild pulmonary hypertension, mild to moderate aortic stenosis, GERD, hyperlipidemia, reactive airway disease, frequent PVCs, who is being seen 09/30/2024 for the evaluation of heart failure exacerbation at the request of Burnard Cunning, DO.   Acute on chronic HFrEF Non  ischemic DCM EF 36% by MRI with no GAD uptake BNP on admission 8501   PTA entresto  24-25 mg BID, metoprolol  succinate 25 mg, spironolactone  25 mg Outpatient decision to defer SGLT2i use obese with risk yeast infection/UTI   Lasix  40 mg iv bid while in hospital. K 4.1 Cr 1.38 stable    Her clinical presentation is multi factorial with bronchitis/GERD causing most of cough   Pulmonary hypertension Per most recent [facility] 2024 mild pulmonary hypertension Patient does have obstructive sleep apnea sleep apnea, CPAP    Hypertension BP: 112/78 mmHg this am stable  Medications as above   Hyperlipidemia Lipid panel pending for am Continue crestor  5 mg   Bifascicular Block ECG with no acute changes RBBB/LAFB  Maude Emmer 10/01/2024, 9:38 AM

## 2024-10-02 ENCOUNTER — Other Ambulatory Visit (HOSPITAL_COMMUNITY): Payer: Self-pay

## 2024-10-02 LAB — BASIC METABOLIC PANEL WITH GFR
Anion gap: 5 (ref 5–15)
BUN: 33 mg/dL — ABNORMAL HIGH (ref 8–23)
CO2: 32 mmol/L (ref 22–32)
Calcium: 8.7 mg/dL — ABNORMAL LOW (ref 8.9–10.3)
Chloride: 107 mmol/L (ref 98–111)
Creatinine, Ser: 1.59 mg/dL — ABNORMAL HIGH (ref 0.44–1.00)
GFR, Estimated: 33 mL/min — ABNORMAL LOW (ref >60)
Glucose, Bld: 104 mg/dL — ABNORMAL HIGH (ref 70–99)
Potassium: 4.2 mmol/L (ref 3.5–5.1)
Sodium: 144 mmol/L (ref 135–145)

## 2024-10-02 MED ORDER — POTASSIUM CHLORIDE CRYS ER 20 MEQ PO TBCR: 20.0000 meq | EXTENDED_RELEASE_TABLET | Freq: Every day | ORAL | 0 refills | Status: DC

## 2024-10-02 MED ORDER — FUROSEMIDE 40 MG PO TABS: 40.0000 mg | ORAL_TABLET | Freq: Every day | ORAL | 0 refills | Status: DC

## 2024-10-02 MED ORDER — FUROSEMIDE 40 MG PO TABS
40.0000 mg | ORAL_TABLET | Freq: Every day | ORAL | 0 refills | Status: AC
  Filled 2024-10-02: qty 30, 30d supply, fill #0

## 2024-10-02 MED ORDER — POTASSIUM CHLORIDE CRYS ER 20 MEQ PO TBCR
20.0000 meq | EXTENDED_RELEASE_TABLET | Freq: Every day | ORAL | 0 refills | Status: DC
  Filled 2024-10-02: qty 30, 30d supply, fill #0

## 2024-10-02 NOTE — Discharge Summary (Signed)
 Physician Discharge Summary  Crystal Yoder FMW:[id number] DOB: 27-Oct-1946 DOA: 09/30/2024  PCP: Teresa Channel, MD  Admit date: 09/30/2024 Discharge date: 10/02/2024  Time spent: 45 minutes  Recommendations for Outpatient Follow-up:  Cardiology Dr. Okey in 2 to 3 weeks Pulmonary in 3 weeks, needs PFTs  Discharge Diagnoses:  Principal Problem:   Acute on chronic combined systolic (congestive) and diastolic (congestive) heart failure (HCC) Chronic cough Emphysema   Acute respiratory failure with hypoxia (HCC)   Obstructive sleep apnea   Nonischemic cardiomyopathy (HCC)   Hyperlipidemia   Bronchitis, chronic (HCC)   GASTROESOPHAGEAL REFLUX DISEASE   Prolonged QT interval   Discharge Condition: Improved  Diet recommendation: Low-sodium, heart healthy  Filed Weights   10/01/24 0300 10/02/24 0439  Weight: 101.6 kg 100 kg    History of present illness:  78/F, former smoker with history of chronic systolic CHF, emphysema, pulmonary hypertension, presented to the ED with ongoing cough, some shortness of breath, recently saw pulmonary and was started on ICS/LABA and as needed albuterol , scheduled for PFTs.  Also taking medications for reflux and allergies which have not helped her cough. -In the ED afebrile, O2 sats dropped to 89%, placed on 2 L O2, creatinine 1.2, proBNP elevated at 05/31/2000, troponin 45, chest x-ray noted cardiomegaly and interstitial coarsening new from prior exam suggestive of pulmonary edema versus atypical infection  Hospital Course:    Acute on chronic combined CHF -Ongoing productive cough for months, mild dyspnea, significantly elevated BNP, chest x-ray with?  Interstitial edema  - Suspect cough is multifactorial, former long-term smoker with emphysema on imaging -Last echo 11/23 noted EF of 40%, grade 2 DD, was not taking diuretics consistently as prescribed prior to admission -Diuresed with IV Lasix , volume status has improved, switch to oral Lasix  40 mg  daily - Continued on home regimen of Entresto  Aldactone  and metoprolol  -Discharged home in stable condition, advised close follow-up with Dr. Okey   Acute respiratory failure with hypoxia (HCC) Chronic cough Emphysema - Secondary to above, former smoker with emphysema on last CT in 2024, suspect underlying undiagnosed COPD as well - Continue ICS/LABA, albuterol  as needed - Follow-up with pulmonary   Obstructive sleep apnea Nightly CPAP continued   GASTROESOPHAGEAL REFLUX DISEASE Continue PPI   Hyperlipidemia Continue Crestor    Discharge Exam: Vitals:   10/02/24 0509 10/02/24 0759  BP: (!) 110/58 118/82  Pulse: 64 77  Resp: (!) 22 17  Temp: 98.4 F (36.9 C) 98.4 F (36.9 C)  SpO2: 96% 91%   General exam: Appears calm and comfortable  Respiratory system: Few scattered rhonchi otherwise clear Cardiovascular system: S1 & S2 heard, RRR.  Abd: nondistended, soft and nontender.Normal bowel sounds heard. Central nervous system: Alert and oriented. No focal neurological deficits. Extremities: no edema Skin: No rashes Psychiatry:  Mood & affect appropriate.   Discharge Instructions   Discharge Instructions     Diet - low sodium heart healthy   Complete by: As directed    Increase activity slowly   Complete by: As directed       Allergies as of 10/02/2024       Reactions   Penicillins Hives   Sulfa Antibiotics Diarrhea, Nausea And Vomiting, Other (See Comments)   Other Reaction: GI Upset   Sulfonamide Derivatives Diarrhea, Nausea And Vomiting        Medication List     TAKE these medications    albuterol  108 (90 Base) MCG/ACT inhaler Commonly known as: VENTOLIN  HFA Inhale 2 puffs into  the lungs every 4 (four) hours as needed for wheezing or shortness of breath.   ascorbic acid 100 MG tablet Commonly known as: VITAMIN C Take 1 tablet by mouth as needed.   Cholecalciferol 25 MCG (1000 UT) capsule Take 1,000 Units by mouth as needed.    clotrimazole-betamethasone cream Commonly known as: LOTRISONE as needed.   colchicine  0.6 MG tablet Take 1 tablet (0.6 mg total) by mouth 2 (two) times daily.   Delsym 30 MG/5ML liquid Generic drug: dextromethorphan Take by mouth as needed for cough.   Entresto  24-26 MG Generic drug: sacubitril -valsartan  Take 1 tablet by mouth 2 (two) times daily.   fluticasone  50 MCG/ACT nasal spray Commonly known as: FLONASE  Place 2 sprays into both nostrils daily as needed for allergies.   fluticasone -salmeterol 250-50 MCG/ACT Aepb Commonly known as: ADVAIR Inhale 1 puff into the lungs every 12 (twelve) hours.   furosemide  40 MG tablet Commonly known as: LASIX  Take 1 tablet (40 mg total) by mouth daily. Take one tablet by mouth 5 days a week Monday, Wednesday, Thursday, Friday,and Sunday and take two tablets by mouth twice a week on Tuesday and Saturday What changed:  medication strength how much to take how to take this when to take this   ipratropium-albuterol 0.5-2.5 (3) MG/3ML Soln Commonly known as: DUONEB Take 3 mLs by nebulization every 6 (six) hours as needed.   loratadine 10 MG tablet Commonly known as: CLARITIN Take 10 mg by mouth daily.   Melatonin 10 MG Tabs Take 20 mg by mouth at bedtime as needed (sleep).   metoprolol succinate 25 MG 24 hr tablet Commonly known as: TOPROL-XL TAKE 1 TABLET(25 MG) BY MOUTH DAILY   Mucinex 600 MG 12 hr tablet Generic drug: guaiFENesin Take 600 mg by mouth as needed.   pantoprazole 40 MG tablet Commonly known as: PROTONIX Take 40 mg by mouth daily.   potassium chloride SA 20 MEQ tablet Commonly known as: KLOR-CON M Take 1 tablet (20 mEq total) by mouth daily. TAKE 1 TABLET(20 MEQ) BY MOUTH EVERY OTHER DAY ON THE SAME DAY AS LASIX 40 MG What changed:  how much to take how to take this when to take this   Quercetin 250 MG Tabs Take 500 mg by mouth as needed.   rosuvastatin 5 MG tablet Commonly known as: CRESTOR TAKE 1  TABLET(5 MG) BY MOUTH DAILY   spironolactone 25 MG tablet Commonly known as: ALDACTONE TAKE 1/2 TABLET(12.5 MG) BY MOUTH DAILY   Trelegy Ellipta 100-62.5-25 MCG/ACT Aepb Generic drug: Fluticasone-Umeclidin-Vilant Inhale 1 puff into the lungs daily.       Allergies  Allergen Reactions   Penicillins Hives   Sulfa Antibiotics Diarrhea, Nausea And Vomiting and Other (See Comments)    Other Reaction: GI Upset   Sulfonamide Derivatives Diarrhea and Nausea And Vomiting      The results of significant diagnostics from this hospitalization (including imaging, microbiology, ancillary and laboratory) are listed below for reference.    Significant Diagnostic Studies: ECHOCARDIOGRAM COMPLETE Result Date: 10/01/2024    ECHOCARDIOGRAM REPORT   Patient Name:   Vanellope K Inda Date of Exam: 10/01/2024 Medical Rec #:  [id number]      Height:       67.0 in Accession #:    [id number]     Weight:       22 4.0 lb Date of Birth:  [birth year]      BSA:          2.122 m Patient Age:  78 years       BP:           112/78 mmHg Patient Gender: F              HR:           73 bpm. Exam Location:  Inpatient Procedure: 2D Echo, Cardiac Doppler and Color Doppler (Both Spectral and Color            Flow Doppler were utilized during procedure). Indications:    CHF- Acute Systolic  History:        Patient has prior history of Echocardiogram examinations, most                 recent 09/22/2022. CHF and Cardiomyopathy; Risk                 Factors:Dyslipidemia, Sleep Apnea and Hypertension.  Sonographer:    Sherlean Dubin Referring Phys: [id number] KELLY A GRIFFITH  Sonographer Comments: Image acquisition challenging due to respiratory motion. IMPRESSIONS  1. Left ventricular ejection fraction, by estimation, is 40 to 45%. The left ventricle has mildly decreased function. The left ventricle demonstrates global hypokinesis. The left ventricular internal cavity size was mildly dilated. There is mild left ventricular hypertrophy of  the basal-septal segment. Left ventricular diastolic parameters are consistent with Grade I diastolic dysfunction (impaired relaxation).  2. Right ventricular systolic function is mildly reduced. The right ventricular size is normal. There is normal pulmonary artery systolic pressure. The estimated right ventricular systolic pressure is 10.3 mmHg.  3. The mitral valve is degenerative. Trivial mitral valve regurgitation. No evidence of mitral stenosis. Moderate mitral annular calcification.  4. The aortic valve is calcified. Aortic valve regurgitation is not visualized. Mild to moderate aortic valve stenosis. Aortic valve area, by VTI measures 1.38 cm. Aortic valve mean gradient measures 6.0 mmHg. Aortic valve Vmax measures 1.74 m/s. DI 0.44 and SVI 23. Findings overall c/w mild low flow low gradient aortic stenosis.  5. The inferior vena cava is normal in size with greater than 50% respiratory variability, suggesting right atrial pressure of 3 mmHg.Recommend repeat limited study with definity contrast to better assess LVF and look for focal wall motion abnormalitiers FINDINGS  Left Ventricle: Left ventricular ejection fraction, by estimation, is 40 to 45%. The left ventricle has mildly decreased function. The left ventricle demonstrates global hypokinesis. The left ventricular internal cavity size was mildly dilated. There is  mild left ventricular hypertrophy of the basal-septal segment. Abnormal (paradoxical) septal motion, consistent with left bundle branch block. Left ventricular diastolic parameters are consistent with Grade I diastolic dysfunction (impaired relaxation).  Normal left ventricular filling pressure. Right Ventricle: The right ventricular size is normal. No increase in right ventricular wall thickness. Right ventricular systolic function is mildly reduced. There is normal pulmonary artery systolic pressure. The tricuspid regurgitant velocity is 1.35 m/s, and with an assumed right atrial pressure  of 3 mmHg, the estimated right ventricular systolic pressure is 10.3 mmHg. Left Atrium: Left atrial size was normal in size. Right Atrium: Right atrial size was normal in size. Pericardium: There is no evidence of pericardial effusion. Mitral Valve: The mitral valve is degenerative in appearance. Moderate mitral annular calcification. Trivial mitral valve regurgitation. No evidence of mitral valve stenosis. Tricuspid Valve: The tricuspid valve is normal in structure. Tricuspid valve regurgitation is trivial. No evidence of tricuspid stenosis. Aortic Valve: The aortic valve is calcified. Aortic valve regurgitation is not visualized. Mild to moderate aortic stenosis is present. Aortic valve  mean gradient measures 6.0 mmHg. Aortic valve peak gradient measures 12.1 mmHg. Aortic valve area, by VTI  measures 1.38 cm. Pulmonic Valve: The pulmonic valve was normal in structure. Pulmonic valve regurgitation is not visualized. No evidence of pulmonic stenosis. Aorta: The aortic root is normal in size and structure. Venous: The inferior vena cava is normal in size with greater than 50% respiratory variability, suggesting right atrial pressure of 3 mmHg. IAS/Shunts: No atrial level shunt detected by color flow Doppler.  LEFT VENTRICLE PLAX 2D LVIDd:         5.70 cm   Diastology LVIDs:         4.40 cm   LV e' medial:    5.66 cm/s LV PW:         0.80 cm   LV E/e' medial:  10.7 LV IVS:        1.20 cm   LV e' lateral:   11.30 cm/s LVOT diam:     2.00 cm   LV E/e' lateral: 5.3 LV SV:         49 LV SV Index:   23 LVOT Area:     3.14 cm  RIGHT VENTRICLE             IVC RV Basal diam:  3.10 cm     IVC diam: 1.70 cm RV Mid diam:    2.40 cm RV S prime:     12.40 cm/s TAPSE (M-mode): 1.5 cm LEFT ATRIUM             Index        RIGHT ATRIUM           Index LA diam:        4.70 cm 2.21 cm/m   RA Area:     18.20 cm LA Vol (A2C):   53.1 ml 25.02 ml/m  RA Volume:   44.80 ml  21.11 ml/m LA Vol (A4C):   53.2 ml 25.07 ml/m LA Biplane Vol:  52.5 ml 24.74 ml/m  AORTIC VALVE AV Area (Vmax):    1.51 cm AV Area (Vmean):   1.51 cm AV Area (VTI):     1.38 cm AV Vmax:           174.00 cm/s AV Vmean:          115.000 cm/s AV VTI:            0.356 m AV Peak Grad:      12.1 mmHg AV Mean Grad:      6.0 mmHg LVOT Vmax:         83.60 cm/s LVOT Vmean:        55.100 cm/s LVOT VTI:          0.156 m LVOT/AV VTI ratio: 0.44  AORTA Ao Root diam: 3.50 cm Ao Asc diam:  3.60 cm MITRAL VALVE                TRICUSPID VALVE MV Area (PHT): 5.79 cm     TR Peak grad:   7.3 mmHg MV Decel Time: 131 msec     TR Vmax:        135.00 cm/s MV E velocity: 60.30 cm/s MV A velocity: 112.00 cm/s  SHUNTS MV E/A ratio:  0.54         Systemic VTI:  0.16 m  Systemic Diam: 2.00 cm Wilbert Bihari MD Electronically signed by Wilbert Bihari MD Signature Date/Time: 10/01/2024/5:11:53 PM    Final    DG Chest Port 1 View Result Date: 10/01/2024 CLINICAL DATA:  Cough. EXAM: PORTABLE CHEST 1 VIEW COMPARISON:  09/30/2024 FINDINGS: Heart size is upper limits of normal and stable. Prominent central vascular markings are stable. No overt pulmonary edema. No focal lung disease. Negative for a pneumothorax. Trachea is midline. IMPRESSION: 1. No acute cardiopulmonary disease. 2. Stable prominent central vascular markings. Electronically Signed   By: Juliene Balder M.D.   On: 10/01/2024 12:01   DG Chest Portable 1 View Result Date: 09/30/2024 CLINICAL DATA:  Shortness of breath.  Cough. EXAM: PORTABLE CHEST 1 VIEW COMPARISON:  Radiograph 08/21/2024, CT 09/22/2023 FINDINGS: The heart is enlarged. Stable mediastinal contours. Hilar prominence secondary to enlarged pulmonary arteries on CT grade interstitial coarsening is new from prior exam. No focal airspace disease. No pleural effusion or pneumothorax. On limited assessment, no acute osseous findings. IMPRESSION: 1. Cardiomegaly. 2. Interstitial coarsening is new from prior exam, may represent pulmonary edema or atypical infection.  Electronically Signed   By: Andrea Gasman M.D.   On: 09/30/2024 14:24    Microbiology: No results found for this or any previous visit (from the past 240 hours).   Labs: Basic Metabolic Panel: Recent Labs  Lab 09/30/24 1413 09/30/24 2119 10/01/24 0243 10/02/24 0231  NA 144  --  144 144  K 3.9  --  4.1 4.2  CL 107  --  108 107  CO2 27  --  28 32  GLUCOSE 100*  --  170* 104*  BUN 19  --  24* 33*  CREATININE 1.28* 1.36* 1.38* 1.59*  CALCIUM  9.6  --  8.7* 8.7*  MG  --   --  2.0  --    Liver Function Tests: Recent Labs  Lab 09/30/24 1413  AST 22  ALT 10  ALKPHOS 92  BILITOT 0.5  PROT 7.5  ALBUMIN 3.9   No results for input(s): LIPASE, AMYLASE in the last 168 hours. No results for input(s): AMMONIA in the last 168 hours. CBC: Recent Labs  Lab 09/30/24 1350 09/30/24 2119  WBC 8.9 8.6  NEUTROABS 6.1  --   HGB 12.5 12.3  HCT 40.3 39.5  MCV 93.3 93.2  PLT 193 195   Cardiac Enzymes: No results for input(s): CKTOTAL, CKMB, CKMBINDEX, TROPONINI in the last 168 hours. BNP: BNP (last 3 results) No results for input(s): BNP in the last 8760 hours.  ProBNP (last 3 results) Recent Labs    09/02/24 1304 09/19/24 1446 09/30/24 1413  PROBNP 2,168* 497.0* 8,501.0*    CBG: No results for input(s): GLUCAP in the last 168 hours.     Signed:  Sigurd Pac MD.  [facility] 10/02/2024, 11:05 AM

## 2024-10-02 NOTE — Evaluation (Signed)
 Physical Therapy Evaluation Patient Details Name: Crystal Yoder MRN: [id number] DOB: May 04, 1946 Today's Date: 10/02/2024  History of Present Illness  78 y.o. female admitted 10/02/24 with cough, SOB. CXR suggestive of pulmonary edema vs atypical infection. Workup for acute on chronic CHF, chronic bronchitis. PMH includes CHF, emphysema, pulmonary HTN, OSA, frequent PVCs.  Clinical Impression  Pt presents with an overall decrease in functional mobility secondary to above. PTA, pt mod indep with intermittent use SPC, lives with son and husband (son serves as his caregiver). Today, pt able to transfer and ambulate with RW at supervision-level; moving fairly well, notable non-productive cough and DOE with SpO2 89% on RA. If to remain admitted, will follow acutely to address established goals.     SpO2 89% on RA with activity     If plan is discharge home, recommend the following: Assistance with cooking/housework;Assist for transportation   Can travel by private vehicle    Yes    Equipment Recommendations None recommended by PT  Recommendations for Other Services   Mobility Specialist    Functional Status Assessment Patient has had a recent decline in their functional status and demonstrates the ability to make significant improvements in function in a reasonable and predictable amount of time.     Precautions / Restrictions Precautions Precautions: Fall;Other (comment) Recall of Precautions/Restrictions: Intact Precaution/Restrictions Comments: watch SpO2 (does not wear baseline) Restrictions Weight Bearing Restrictions Per Provider Order: No      Mobility  Bed Mobility Overal bed mobility: Modified Independent                  Transfers Overall transfer level: Needs assistance Equipment used: Rolling walker (2 wheels) Transfers: Sit to/from Stand Sit to Stand: Supervision           General transfer comment: pt requesting RW use over her [facility] stating, my legs are  probably weak since I've been laying in bed    Ambulation/Gait Ambulation/Gait assistance: Supervision Gait Distance (Feet): 20 Feet Assistive device: Rolling walker (2 wheels) Gait Pattern/deviations: Step-through pattern, Decreased stride length, Trunk flexed Gait velocity: Decreased     General Gait Details: slow, steady gait with RW and supervision for safety/lines; further distance deferred as pt wanting to eat breakfast  Stairs            Wheelchair Mobility     Tilt Bed    Modified Rankin (Stroke Patients Only)       Balance Overall balance assessment: Needs assistance Sitting-balance support: No upper extremity supported, Feet supported Sitting balance-Leahy Scale: Good     Standing balance support: No upper extremity supported, During functional activity Standing balance-Leahy Scale: Fair Standing balance comment: can stand without UE support; static and dynamic stability improved with RW                             Pertinent Vitals/Pain Pain Assessment Pain Assessment: Faces Faces Pain Scale: Hurts a little bit Pain Location: chronic foot pain Pain Descriptors / Indicators: Discomfort Pain Intervention(s): Monitored during session, Limited activity within patient's tolerance    Home Living Family/patient expects to be discharged to:: Private residence Living Arrangements: Spouse/significant other;Children Available Help at Discharge: Family;Available 24 hours/day Type of Home: House Home Access: Stairs to enter Entrance Stairs-Rails: Right Entrance Stairs-Number of Steps: 2   Home Layout: One level Home Equipment: [employer] (2 wheels);Cane - single point;BSC/3in1;Other (comment) (hoyer lift) Additional Comments: lives with husband who requires 24/7 assist  from son (lives with them to serve as caregiver), including hoyer lift use.    Prior Function Prior Level of Function : Independent/Modified Independent              Mobility Comments: typically mod indep with SPC; enjoys going to church ADLs Comments: indep     Extremity/Trunk Assessment   Upper Extremity Assessment Upper Extremity Assessment: Overall WFL for tasks assessed    Lower Extremity Assessment Lower Extremity Assessment: Overall WFL for tasks assessed    Cervical / Trunk Assessment Cervical / Trunk Assessment: Kyphotic  Communication   Communication Communication: No apparent difficulties    Cognition Arousal: Alert Behavior During Therapy: WFL for tasks assessed/performed   PT - Cognitive impairments: No apparent impairments                       PT - Cognition Comments: verbose/tangential with speech and decreased attention requiring intermittent redirection to current task/conversation Following commands: Intact       Cueing Cueing Techniques: Verbal cues     General Comments General comments (skin integrity, edema, etc.): SpO2 89% on RA with activity; replaced 2L O2 [place] at end of session. educ re: role of acute PT, POC, activity recommendations, pulmonary hygiene, importance of mobility, potential DME use and d/c needs    Exercises     Assessment/Plan    PT Assessment Patient needs continued PT services  PT Problem List Decreased activity tolerance;Decreased balance;Decreased mobility;Cardiopulmonary status limiting activity;Decreased knowledge of use of DME       PT Treatment Interventions DME instruction;Gait training;Stair training;Functional mobility training;Therapeutic activities;Balance training;Therapeutic exercise;Patient/family education    PT Goals (Current goals can be found in the Care Plan section)  Acute Rehab PT Goals Patient Stated Goal: return home PT Goal Formulation: With patient Time For Goal Achievement: 10/16/24 Potential to Achieve Goals: Good    Frequency Min 1X/week     Co-evaluation               AM-PAC PT 6 Clicks Mobility  Outcome Measure Help needed  turning from your back to your side while in a flat bed without using bedrails?: None Help needed moving from lying on your back to sitting on the side of a flat bed without using bedrails?: None Help needed moving to and from a bed to a chair (including a wheelchair)?: A Little Help needed standing up from a chair using your arms (e.g., wheelchair or bedside chair)?: A Little Help needed to walk in hospital room?: A Little Help needed climbing 3-5 steps with a railing? : A Little 6 Click Score: 20    End of Session   Activity Tolerance: Patient tolerated treatment well Patient left: in chair;with call bell/phone within reach;with chair alarm set Nurse Communication: Mobility status PT Visit Diagnosis: Other abnormalities of gait and mobility (R26.89)    Time: 9254-9192 PT Time Calculation (min) (ACUTE ONLY): 22 min   Charges:   PT Evaluation $PT Eval Low Complexity: 1 Low   PT General Charges $$ ACUTE PT VISIT: 1 Visit       Darice Almas, PT, DPT Acute Rehabilitation Services  Personal: Secure Chat Rehab Office: [phone]  Darice LITTIE Almas 10/02/2024, 9:42 AM

## 2024-10-02 NOTE — Plan of Care (Signed)
  Problem: Education: Goal: Knowledge of General Education information will improve Description: Including pain rating scale, medication(s)/side effects and non-pharmacologic comfort measures Outcome: Progressing   Problem: Health Behavior/Discharge Planning: Goal: Ability to manage health-related needs will improve Outcome: Progressing   Problem: Clinical Measurements: Goal: Will remain free from infection Outcome: Progressing   Problem: Elimination: Goal: Will not experience complications related to bowel motility Outcome: Progressing   

## 2024-10-02 NOTE — Progress Notes (Signed)
 Cardiologist:  Okey  Subjective:   Cough better   Objective:  Vitals:   10/02/24 0100 10/02/24 0439 10/02/24 0509 10/02/24 0759  BP:   (!) 110/58 118/82  Pulse: 70  64 77  Resp: (!) 22  (!) 22 17  Temp:   98.4 F (36.9 C) 98.4 F (36.9 C)  TempSrc:   Oral Oral  SpO2: 94%  96% 91%  Weight:  100 kg    Height:        Intake/Output from previous day:  Intake/Output Summary (Last 24 hours) at 10/02/2024 1056 Last data filed at 10/02/2024 0000 Gross per 24 hour  Intake 240 ml  Output 950 ml  Net -710 ml    Physical Exam: Anxious elderly female  Overweight Basilar crackles JVP elevate with V wave SEM murmur Abdomen benign Plus one edema   Lab Results: Basic Metabolic Panel: Recent Labs    10/01/24 0243 10/02/24 0231  NA 144 144  K 4.1 4.2  CL 108 107  CO2 28 32  GLUCOSE 170* 104*  BUN 24* 33*  CREATININE 1.38* 1.59*  CALCIUM  8.7* 8.7*  MG 2.0  --    Liver Function Tests: Recent Labs    09/30/24 1413  AST 22  ALT 10  ALKPHOS 92  BILITOT 0.5  PROT 7.5  ALBUMIN 3.9    CBC: Recent Labs    09/30/24 1350 09/30/24 2119  WBC 8.9 8.6  NEUTROABS 6.1  --   HGB 12.5 12.3  HCT 40.3 39.5  MCV 93.3 93.2  PLT 193 195   Recent Labs    10/01/24 0243  CHOL 138  HDL 58  LDLCALC 72  TRIG 42  CHOLHDL 2.4     Imaging: ECHOCARDIOGRAM COMPLETE Result Date: 10/01/2024    ECHOCARDIOGRAM REPORT   Patient Name:   JOEY HUDOCK Date of Exam: 10/01/2024 Medical Rec #:  [id number]      Height:       67.0 in Accession #:    [id number]     Weight:       224.0 lb Date of Birth:  04-07-1946      BSA:          2.122 m Patient Age:    78 years       BP:           112/78 mmHg Patient Gender: F              HR:           73 bpm. Exam Location:  Inpatient Procedure: 2D Echo, Cardiac Doppler and Color Doppler (Both Spectral and Color            Flow Doppler were utilized during procedure). Indications:    CHF- Acute Systolic  History:        Patient has prior history of  Echocardiogram examinations, most                 recent 09/22/2022. CHF and Cardiomyopathy; Risk                 Factors:Dyslipidemia, Sleep Apnea and Hypertension.  Sonographer:    Sherlean Dubin Referring Phys: [id number] KELLY A GRIFFITH  Sonographer Comments: Image acquisition challenging due to respiratory motion. IMPRESSIONS  1. Left ventricular ejection fraction, by estimation, is 40 to 45%. The left ventricle has mildly decreased function. The left ventricle demonstrates global hypokinesis. The left ventricular internal cavity size was mildly dilated. There is mild left ventricular hypertrophy  of the basal-septal segment. Left ventricular diastolic parameters are consistent with Grade I diastolic dysfunction (impaired relaxation).  2. Right ventricular systolic function is mildly reduced. The right ventricular size is normal. There is normal pulmonary artery systolic pressure. The estimated right ventricular systolic pressure is 10.3 mmHg.  3. The mitral valve is degenerative. Trivial mitral valve regurgitation. No evidence of mitral stenosis. Moderate mitral annular calcification.  4. The aortic valve is calcified. Aortic valve regurgitation is not visualized. Mild to moderate aortic valve stenosis. Aortic valve area, by VTI measures 1.38 cm. Aortic valve mean gradient measures 6.0 mmHg. Aortic valve Vmax measures 1.74 m/s. DI 0.44 and SVI 23. Findings overall c/w mild low flow low gradient aortic stenosis.  5. The inferior vena cava is normal in size with greater than 50% respiratory variability, suggesting right atrial pressure of 3 mmHg.Recommend repeat limited study with definity contrast to better assess LVF and look for focal wall motion abnormalitiers FINDINGS  Left Ventricle: Left ventricular ejection fraction, by estimation, is 40 to 45%. The left ventricle has mildly decreased function. The left ventricle demonstrates global hypokinesis. The left ventricular internal cavity size was mildly  dilated. There is  mild left ventricular hypertrophy of the basal-septal segment. Abnormal (paradoxical) septal motion, consistent with left bundle branch block. Left ventricular diastolic parameters are consistent with Grade I diastolic dysfunction (impaired relaxation).  Normal left ventricular filling pressure. Right Ventricle: The right ventricular size is normal. No increase in right ventricular wall thickness. Right ventricular systolic function is mildly reduced. There is normal pulmonary artery systolic pressure. The tricuspid regurgitant velocity is 1.35 m/s, and with an assumed right atrial pressure of 3 mmHg, the estimated right ventricular systolic pressure is 10.3 mmHg. Left Atrium: Left atrial size was normal in size. Right Atrium: Right atrial size was normal in size. Pericardium: There is no evidence of pericardial effusion. Mitral Valve: The mitral valve is degenerative in appearance. Moderate mitral annular calcification. Trivial mitral valve regurgitation. No evidence of mitral valve stenosis. Tricuspid Valve: The tricuspid valve is normal in structure. Tricuspid valve regurgitation is trivial. No evidence of tricuspid stenosis. Aortic Valve: The aortic valve is calcified. Aortic valve regurgitation is not visualized. Mild to moderate aortic stenosis is present. Aortic valve mean gradient measures 6.0 mmHg. Aortic valve peak gradient measures 12.1 mmHg. Aortic valve area, by VTI  measures 1.38 cm. Pulmonic Valve: The pulmonic valve was normal in structure. Pulmonic valve regurgitation is not visualized. No evidence of pulmonic stenosis. Aorta: The aortic root is normal in size and structure. Venous: The inferior vena cava is normal in size with greater than 50% respiratory variability, suggesting right atrial pressure of 3 mmHg. IAS/Shunts: No atrial level shunt detected by color flow Doppler.  LEFT VENTRICLE PLAX 2D LVIDd:         5.70 cm   Diastology LVIDs:         4.40 cm   LV e' medial:     5.66 cm/s LV PW:         0.80 cm   LV E/e' medial:  10.7 LV IVS:        1.20 cm   LV e' lateral:   11.30 cm/s LVOT diam:     2.00 cm   LV E/e' lateral: 5.3 LV SV:         49 LV SV Index:   23 LVOT Area:     3.14 cm  RIGHT VENTRICLE  IVC RV Basal diam:  3.10 cm     IVC diam: 1.70 cm RV Mid diam:    2.40 cm RV S prime:     12.40 cm/s TAPSE (M-mode): 1.5 cm LEFT ATRIUM             Index        RIGHT ATRIUM           Index LA diam:        4.70 cm 2.21 cm/m   RA Area:     18.20 cm LA Vol (A2C):   53.1 ml 25.02 ml/m  RA Volume:   44.80 ml  21.11 ml/m LA Vol (A4C):   53.2 ml 25.07 ml/m LA Biplane Vol: 52.5 ml 24.74 ml/m  AORTIC VALVE AV Area (Vmax):    1.51 cm AV Area (Vmean):   1.51 cm AV Area (VTI):     1.38 cm AV Vmax:           174.00 cm/s AV Vmean:          115.000 cm/s AV VTI:            0.356 m AV Peak Grad:      12.1 mmHg AV Mean Grad:      6.0 mmHg LVOT Vmax:         83.60 cm/s LVOT Vmean:        55.100 cm/s LVOT VTI:          0.156 m LVOT/AV VTI ratio: 0.44  AORTA Ao Root diam: 3.50 cm Ao Asc diam:  3.60 cm MITRAL VALVE                TRICUSPID VALVE MV Area (PHT): 5.79 cm     TR Peak grad:   7.3 mmHg MV Decel Time: 131 msec     TR Vmax:        135.00 cm/s MV E velocity: 60.30 cm/s MV A velocity: 112.00 cm/s  SHUNTS MV E/A ratio:  0.54         Systemic VTI:  0.16 m                             Systemic Diam: 2.00 cm Wilbert Bihari MD Electronically signed by Wilbert Bihari MD Signature Date/Time: 10/01/2024/5:11:53 PM    Final    DG Chest Port 1 View Result Date: 10/01/2024 CLINICAL DATA:  Cough. EXAM: PORTABLE CHEST 1 VIEW COMPARISON:  09/30/2024 FINDINGS: Heart size is upper limits of normal and stable. Prominent central vascular markings are stable. No overt pulmonary edema. No focal lung disease. Negative for a pneumothorax. Trachea is midline. IMPRESSION: 1. No acute cardiopulmonary disease. 2. Stable prominent central vascular markings. Electronically Signed   By: Juliene Balder M.D.   On:  10/01/2024 12:01   DG Chest Portable 1 View Result Date: 09/30/2024 CLINICAL DATA:  Shortness of breath.  Cough. EXAM: PORTABLE CHEST 1 VIEW COMPARISON:  Radiograph 08/21/2024, CT 09/22/2023 FINDINGS: The heart is enlarged. Stable mediastinal contours. Hilar prominence secondary to enlarged pulmonary arteries on CT grade interstitial coarsening is new from prior exam. No focal airspace disease. No pleural effusion or pneumothorax. On limited assessment, no acute osseous findings. IMPRESSION: 1. Cardiomegaly. 2. Interstitial coarsening is new from prior exam, may represent pulmonary edema or atypical infection. Electronically Signed   By: Andrea Gasman M.D.   On: 09/30/2024 14:24    Cardiac Studies:  ECG: SR RBBB / LAFB  Telemetry:  NSR   Echo: pending   Medications:    budesonide -glycopyrrolate -formoterol   2 puff Inhalation BID   enoxaparin  (LOVENOX ) injection  50 mg Subcutaneous QHS   furosemide   40 mg Intravenous BID   loratadine   10 mg Oral Daily   metoprolol  succinate  25 mg Oral Daily   pantoprazole   40 mg Oral Daily   rosuvastatin   5 mg Oral Daily   sacubitril -valsartan   1 tablet Oral BID   spironolactone   12.5 mg Oral Daily      Assessment/Plan:  Crystal Yoder is a 78 y.o. female with a hx of hypertension, nonischemic cardiomyopathy, mild pulmonary hypertension, mild to moderate aortic stenosis, GERD, hyperlipidemia, reactive airway disease, frequent PVCs, who is being seen 09/30/2024 for the evaluation of heart failure exacerbation at the request of Burnard Cunning, DO.   Acute on chronic HFrEF Non ischemic DCM EF 36% by MRI with no GAD uptake BNP on admission 8501   PTA entresto  24-25 mg BID, metoprolol  succinate 25 mg, spironolactone  25 mg Outpatient decision to defer SGLT2i use obese with risk yeast infection/UTI   Change lasix  to 40 mg PO daily Will need BNP/BMET in 2 weeks  This am K 4.2 and Cr only mildly elevated from baseline at 1.59   Her clinical presentation  is multi factorial with bronchitis/GERD causing most of cough   Pulmonary hypertension Per most recent [facility] 2024 mild pulmonary hypertension Patient does have obstructive sleep apnea sleep apnea, CPAP    Hypertension BP: 118/82  mmHg this am stable  Medications as above   Hyperlipidemia Lipid panel pending for am Continue crestor  5 mg   Bifascicular Block ECG with no acute changes RBBB/LAFB  Ok to d/c home today will arrange outpatient f/u with Dr Okey Coy [facility] 10/02/2024, 10:56 AM

## 2024-10-03 ENCOUNTER — Telehealth: Payer: Self-pay | Admitting: Family

## 2024-10-03 NOTE — Telephone Encounter (Signed)
  TOC appt per Dr. Delford  Scheduled with Reche Finder on 10/18/24 at 2:20 pm See staff message from Dr. Delford:  Nishan, Peter C, MD  [facility] Scheduling Needs TOC with DR Okey or PA  Needs BMET/BNP in 2 weeks Admitted with CHF and diuretics adjusted

## 2024-10-03 NOTE — Telephone Encounter (Signed)
 Patient contacted by clinical team regarding discharge from hospital on 10/02/24 from [facility].  Patient understands to follow up with provider Reche Finder, NP on 10/18/24 at 2:20 pm at [facility] Location. Patient understands discharge instructions? yes Patient understands medications and how to take them? yes Patient understands to bring all medications to this visit? yes   Pt is aware we will do labs same day as she comes into the clinic for [facility] visit, on 10/18/24.  This was advised at discharge per Dr. Delford.  We will check BMET/BNP at that time.  Updated this in appointment notes.

## 2024-10-07 ENCOUNTER — Telehealth: Payer: Self-pay

## 2024-10-07 MED ORDER — FLUTICASONE-SALMETEROL 250-50 MCG/ACT IN AEPB: 1.0000 | INHALATION_SPRAY | Freq: Two times a day (BID) | RESPIRATORY_TRACT | 11 refills | Status: DC

## 2024-10-07 NOTE — Telephone Encounter (Signed)
 Copied from CRM #[id number]. Topic: Clinical - Prescription Issue >> Oct 05, 2024  1:45 PM Joesph PARAS wrote: Reason for CRM: Patient rep with [facility] is calling to get clarity on RX for Symbicort . States there is a similar rx what was picked up 11/24 and they need clarity on which plan of care the patient is following. C/B [phone].     But a Rx for aidvair was fill by [facility] today. By Dr. Pleas   Symbicort  was canceled   It is advair she needs to take

## 2024-10-07 NOTE — Addendum Note (Signed)
 Addended by: Mieka Leaton on: 10/07/2024 11:59 AM   Modules accepted: Orders

## 2024-10-07 NOTE — Telephone Encounter (Signed)
 Called to let patient know that her Advair inhaler was sent to [facility]. Nfn

## 2024-10-08 ENCOUNTER — Telehealth: Payer: Self-pay | Admitting: Physician Assistant

## 2024-10-08 NOTE — Telephone Encounter (Signed)
 Pt called stating she is having slow HR in the 30s. Yesterday HR was 130s.   Hx of HFrEF/NICM and high PVC burden on BB.   With further discussion she is generally asymptomatic and this was an incidental finding on a routine pulse oximeter. Since discharge, they  have been checking her O2 several times per day.   We discussed that she has a history of frequent PVCs and this could make heart rate readings on a pulse ox inaccurate. She is adamant she does not want to go to the ER. I advised to take a manual HR for at least 30 sec. If below 45 or above 120, they will call EMS to obtain  a 12 lead.   She denies pre-syncope, syncope, and dizziness. No palpitations. No new symptoms.    Jon Nat Hails, PA-C 10/08/2024, 1:57 PM [phone] [facility] [address] [place], [place] [address]

## 2024-10-11 NOTE — Telephone Encounter (Signed)
 Already been address.    -NFN

## 2024-10-13 ENCOUNTER — Ambulatory Visit: Payer: Self-pay | Admitting: Internal Medicine

## 2024-10-18 ENCOUNTER — Ambulatory Visit (HOSPITAL_BASED_OUTPATIENT_CLINIC_OR_DEPARTMENT_OTHER)

## 2024-10-18 ENCOUNTER — Encounter (HOSPITAL_BASED_OUTPATIENT_CLINIC_OR_DEPARTMENT_OTHER): Payer: Self-pay

## 2024-10-18 DIAGNOSIS — E782 Mixed hyperlipidemia: Secondary | ICD-10-CM

## 2024-10-18 DIAGNOSIS — Z79899 Other long term (current) drug therapy: Secondary | ICD-10-CM | POA: Diagnosis not present

## 2024-10-18 DIAGNOSIS — G4733 Obstructive sleep apnea (adult) (pediatric): Secondary | ICD-10-CM | POA: Diagnosis not present

## 2024-10-18 DIAGNOSIS — I10 Essential (primary) hypertension: Secondary | ICD-10-CM

## 2024-10-18 DIAGNOSIS — I5020 Unspecified systolic (congestive) heart failure: Secondary | ICD-10-CM

## 2024-10-18 DIAGNOSIS — I272 Pulmonary hypertension, unspecified: Secondary | ICD-10-CM | POA: Diagnosis not present

## 2024-10-18 MED ORDER — POTASSIUM CHLORIDE CRYS ER 20 MEQ PO TBCR: 20.0000 meq | EXTENDED_RELEASE_TABLET | ORAL | 1 refills | Status: AC

## 2024-10-18 NOTE — Patient Instructions (Addendum)
 Medication Instructions:   DECREASE YOUR POTASSIUM CHLORIDE  TO 20 mEq BY MOUTH EVERY OTHER DAY--TAKE ON MONDAY, WEDNESDAY, AND FRIDAY.  *If you need a refill on your cardiac medications before your next appointment, please call your pharmacy*  Lab Work:  RETURN FOR LAB WORK IN 2-3 WEEKS EITHER HERE AT [facility] ON THE 3RD FLOOR OR [facility]--(THAT WOULD BE AROUND 11/01/24 - 11/08/24)--CMET AND BNP  If you have labs (blood work) drawn today and your tests are completely normal, you will receive your results only by: MyChart Message (if you have MyChart) OR A paper copy in the mail If you have any lab test that is abnormal or we need to change your treatment, we will call you to review the results.    Follow-Up:  2-3 MONTHS WITH DR. ROSS OR AN EXTENDER AT [facility] LOCATION    Other instructions:  Recommend weighing daily and keeping a log. Please call our office if you have weight gain of 2 pounds overnight or 5 pounds in 1 week.   Date  Time Weight

## 2024-10-18 NOTE — Progress Notes (Signed)
 " Cardiology Office Note   Date:  10/18/2024  ID:  Hava, Massingale 1946/06/04, MRN [id number] PCP: Teresa Channel, MD   [facility] Providers Cardiologist:  Vina Gull, MD     History of Present Illness MELITA VILLALONA is a 78 y.o. female with a history of CHF, nonischemic cardiomyopathy, PVC's, pulmonary hypertension, hypertension, hyperlipidemia, OSA, and venous insufficiency.    Heart monitor 05/2018 showed PVC burden of 31%.  Echo 06/2018 LVEF 30 to 35% with moderate dilation of the LV, moderate LVH, mild-moderate aortic valve stenosis.  06/2018 CCTA calcium  score was 9 (38th percentile), mild, nonobstructive CAD, severely dilated pulmonary artery measuring 53 mm.  She underwent right heart catheterization 07/2018 with results of mildly elevated right heart pressures and moderate pulmonary hypertension.  She had repeat echo 11/2018 with improved LVEF 45-50%, abnormal septal motion consistent with LBBB.  She wore another heart monitor 03/2023 with 7% PVC burden.  10/2022 cardiac MRI LVEF 36%, and did not appear to be an infiltrative process.  She was last seen in office 08/2024 with no changes made.  She was hospitalized 09/30/2024 in the setting of heart failure exacerbation.  BNP 8501 and SpO2 89% on room air.  Echo 12/12 in hospital LVEF 40-45%, LV mildly decreased function, LV global hypokinesis, grade 1 DD, RV systolic function mildly reduced, aortic valve regurgitation, and mild to moderate aortic valve stenosis.  She was aggressively diuresed and discharged 12/6 with close follow-up.    She presents today for hospital follow-up in the setting of heart failure exacerbation.  She unfortunately lost her husband 3 days ago.  She saw her primary care provider 12/16. She had an increase in her creatinine to 1.82 and they adjusted her Lasix  to alternating 40 mg and 20 mg every other day.  She notes increased fatigue.  She denies taking her weight at home.  Her son takes her BP at home and states  SBP in low 100s. Her son notes difficulty breathing that has been consistent for a while. She has seen her pulmonologist in January to help so she needs oxygen at home.  She reports drinking 3 bottles of water daily and ginger ale as well. She has not as active but used to be involved in water aerobics.  She hopes to eventually participate again. She denies lightheadedness, dizziness, CP, and syncopal episodes.  ROS: All systems negative unless otherwise indicated in HPI.  Studies Reviewed      Cardiac Studies & Procedures   ______________________________________________________________________________________________ CARDIAC CATHETERIZATION  CARDIAC CATHETERIZATION 10/07/2023  Conclusion   Hemodynamic findings consistent with mild pulmonary hypertension.  Hemodynamic findings:  RA mean 6 mmHg RV 49 over 11 mmHg PA 52/24 mmHg with a mean PA pressure of 32 mmHg Pulmonary wedge pressure of 11 mmHg, A-wave 15, V wave 13 mmHg  Cardiac output 6.6 L/min Cardiac index 2.9 L/min/m  Final conclusion: Mild pulmonary hypertension with PVR 3.2 Wood units   CARDIAC CATHETERIZATION  CARDIAC CATHETERIZATION 07/28/2018  Conclusion Moderate elevation of right heart pressures with moderate pulmonary hypertension with PA systolic ranging from 55 to 62 mmHg and mean PA pressure at 36/37 meters mercury.    Findings Coronary Findings Diagnostic  Dominance: Right  No diagnostic findings have been documented. Intervention  No interventions have been documented.     ECHOCARDIOGRAM  ECHOCARDIOGRAM COMPLETE 10/01/2024  Narrative ECHOCARDIOGRAM REPORT    Patient Name:   ZOHAL RENY Date of Exam: 10/01/2024 Medical Rec #:  [id number]  Height:       67.0 in Accession #:    [id number]     Weight:       224.0 lb Date of Birth:  August 15, 1946      BSA:          2.122 m Patient Age:    78 years       BP:           112/78 mmHg Patient Gender: F              HR:           73 bpm. Exam  Location:  Inpatient  Procedure: 2D Echo, Cardiac Doppler and Color Doppler (Both Spectral and Color Flow Doppler were utilized during procedure).  Indications:    CHF- Acute Systolic  History:        Patient has prior history of Echocardiogram examinations, most recent 09/22/2022. CHF and Cardiomyopathy; Risk Factors:Dyslipidemia, Sleep Apnea and Hypertension.  Sonographer:    Sherlean Dubin Referring Phys: [id number] KELLY A GRIFFITH   Sonographer Comments: Image acquisition challenging due to respiratory motion. IMPRESSIONS   1. Left ventricular ejection fraction, by estimation, is 40 to 45%. The left ventricle has mildly decreased function. The left ventricle demonstrates global hypokinesis. The left ventricular internal cavity size was mildly dilated. There is mild left ventricular hypertrophy of the basal-septal segment. Left ventricular diastolic parameters are consistent with Grade I diastolic dysfunction (impaired relaxation). 2. Right ventricular systolic function is mildly reduced. The right ventricular size is normal. There is normal pulmonary artery systolic pressure. The estimated right ventricular systolic pressure is 10.3 mmHg. 3. The mitral valve is degenerative. Trivial mitral valve regurgitation. No evidence of mitral stenosis. Moderate mitral annular calcification. 4. The aortic valve is calcified. Aortic valve regurgitation is not visualized. Mild to moderate aortic valve stenosis. Aortic valve area, by VTI measures 1.38 cm. Aortic valve mean gradient measures 6.0 mmHg. Aortic valve Vmax measures 1.74 m/s. DI 0.44 and SVI 23. Findings overall c/w mild low flow low gradient aortic stenosis. 5. The inferior vena cava is normal in size with greater than 50% respiratory variability, suggesting right atrial pressure of 3 mmHg.Recommend repeat limited study with definity contrast to better assess LVF and look for focal wall motion abnormalitiers  FINDINGS Left Ventricle:  Left ventricular ejection fraction, by estimation, is 40 to 45%. The left ventricle has mildly decreased function. The left ventricle demonstrates global hypokinesis. The left ventricular internal cavity size was mildly dilated. There is mild left ventricular hypertrophy of the basal-septal segment. Abnormal (paradoxical) septal motion, consistent with left bundle branch block. Left ventricular diastolic parameters are consistent with Grade I diastolic dysfunction (impaired relaxation). Normal left ventricular filling pressure.  Right Ventricle: The right ventricular size is normal. No increase in right ventricular wall thickness. Right ventricular systolic function is mildly reduced. There is normal pulmonary artery systolic pressure. The tricuspid regurgitant velocity is 1.35 m/s, and with an assumed right atrial pressure of 3 mmHg, the estimated right ventricular systolic pressure is 10.3 mmHg.  Left Atrium: Left atrial size was normal in size.  Right Atrium: Right atrial size was normal in size.  Pericardium: There is no evidence of pericardial effusion.  Mitral Valve: The mitral valve is degenerative in appearance. Moderate mitral annular calcification. Trivial mitral valve regurgitation. No evidence of mitral valve stenosis.  Tricuspid Valve: The tricuspid valve is normal in structure. Tricuspid valve regurgitation is trivial. No evidence of tricuspid stenosis.  Aortic Valve: The aortic valve  is calcified. Aortic valve regurgitation is not visualized. Mild to moderate aortic stenosis is present. Aortic valve mean gradient measures 6.0 mmHg. Aortic valve peak gradient measures 12.1 mmHg. Aortic valve area, by VTI measures 1.38 cm.  Pulmonic Valve: The pulmonic valve was normal in structure. Pulmonic valve regurgitation is not visualized. No evidence of pulmonic stenosis.  Aorta: The aortic root is normal in size and structure.  Venous: The inferior vena cava is normal in size with  greater than 50% respiratory variability, suggesting right atrial pressure of 3 mmHg.  IAS/Shunts: No atrial level shunt detected by color flow Doppler.   LEFT VENTRICLE PLAX 2D LVIDd:         5.70 cm   Diastology LVIDs:         4.40 cm   LV e' medial:    5.66 cm/s LV PW:         0.80 cm   LV E/e' medial:  10.7 LV IVS:        1.20 cm   LV e' lateral:   11.30 cm/s LVOT diam:     2.00 cm   LV E/e' lateral: 5.3 LV SV:         49 LV SV Index:   23 LVOT Area:     3.14 cm   RIGHT VENTRICLE             IVC RV Basal diam:  3.10 cm     IVC diam: 1.70 cm RV Mid diam:    2.40 cm RV S prime:     12.40 cm/s TAPSE (M-mode): 1.5 cm  LEFT ATRIUM             Index        RIGHT ATRIUM           Index LA diam:        4.70 cm 2.21 cm/m   RA Area:     18.20 cm LA Vol (A2C):   53.1 ml 25.02 ml/m  RA Volume:   44.80 ml  21.11 ml/m LA Vol (A4C):   53.2 ml 25.07 ml/m LA Biplane Vol: 52.5 ml 24.74 ml/m AORTIC VALVE AV Area (Vmax):    1.51 cm AV Area (Vmean):   1.51 cm AV Area (VTI):     1.38 cm AV Vmax:           174.00 cm/s AV Vmean:          115.000 cm/s AV VTI:            0.356 m AV Peak Grad:      12.1 mmHg AV Mean Grad:      6.0 mmHg LVOT Vmax:         83.60 cm/s LVOT Vmean:        55.100 cm/s LVOT VTI:          0.156 m LVOT/AV VTI ratio: 0.44  AORTA Ao Root diam: 3.50 cm Ao Asc diam:  3.60 cm  MITRAL VALVE                TRICUSPID VALVE MV Area (PHT): 5.79 cm     TR Peak grad:   7.3 mmHg MV Decel Time: 131 msec     TR Vmax:        135.00 cm/s MV E velocity: 60.30 cm/s MV A velocity: 112.00 cm/s  SHUNTS MV E/A ratio:  0.54         Systemic VTI:  0.16 m Systemic Diam: 2.00 cm  Wilbert Bihari MD Electronically signed by Wilbert Bihari MD Signature Date/Time: 10/01/2024/5:11:53 PM    Final    MONITORS  CARDIAC EVENT MONITOR 09/25/2022  Narrative Sinus rhythm   45 to 124 bpm  Average HR 65 bpm Frequent PVCs (6% total), frequent PACs (5% total) Several short bursts  VT   Longest 13 beats at 150 bpm Diary event correlated with SR witrh PVC   CT SCANS  CT CORONARY MORPH W/CTA COR W/SCORE 07/19/2018  Addendum 07/19/2018  6:13 PM ADDENDUM REPORT: 07/19/2018 18:11  CLINICAL DATA:  78 year old female with h/o hypertension, NICM (LVEF 45 to 50%) GERD, hyperlipidemia with worsening fatigue.  EXAM: Cardiac/Coronary  CT  TECHNIQUE: The patient was scanned on a [employer].  FINDINGS: A 120 kV prospective scan was triggered in the descending thoracic aorta at 111 HU's. Axial non-contrast 3 mm slices were carried out through the heart. The data set was analyzed on a dedicated work station and scored using the Agatson method. Gantry rotation speed was 250 msecs and collimation was .6 mm. No beta blockade and 0.8 mg of sl NTG was given. The 3D data set was reconstructed in 5% intervals of the 67-82 % of the R-R cycle. Diastolic phases were analyzed on a dedicated work station using MPR, MIP and VRT modes. The patient received 80 cc of contrast.  Aorta:  Normal size.  No calcifications.  No dissection.  Aortic Valve:  Trileaflet.  No calcifications.  Coronary Arteries:  Normal coronary origin.  Right dominance.  RCA is a very large dominant artery that gives rise to PDA and PLVB. There is no plaque.  Left main is a large artery that gives rise to LAD, ramus intermedius and LCX arteries. Left main has no plaque.  LAD is a medium size vessel that has mild calcified plaque in the proximal portion.  LCX is a small non-dominant artery that has no obvious plaque.  Other findings:  Normal pulmonary vein drainage into the left atrium.  Normal let atrial appendage without a thrombus.  IMPRESSION: 1. Coronary calcium  score of 9. This was 108 percentile for age and sex matched control.  2. Normal coronary origin with right dominance.  3. This study is affected by motion, however there is only mild non-obstructive CAD.  4. Pulmonary  artery is severely dilated measuring 53 mm suggestive of pulmonary hypertension.   Electronically Signed By: Leim Moose On: 07/19/2018 18:11  Narrative EXAM: OVER-READ INTERPRETATION  CT CHEST  The following report is an over-read performed by radiologist Dr. Toribio Aye of [facility] on 07/19/2018. This over-read does not include interpretation of cardiac or coronary anatomy or pathology. The coronary calcium  score/coronary CTA interpretation by the cardiologist is attached.  COMPARISON:  Chest CT 10/30/2017.  FINDINGS: Severe dilatation of the pulmonic trunk (5 cm in diameter). Within the visualized portions of the thorax there are no suspicious appearing pulmonary nodules or masses, there is no acute consolidative airspace disease, no pleural effusions, no pneumothorax and no lymphadenopathy. Visualized portions of the upper abdomen are unremarkable. There are no aggressive appearing lytic or blastic lesions noted in the visualized portions of the skeleton.  IMPRESSION: 1. Severe dilatation of the pulmonic trunk (5 cm in diameter), concerning for pulmonary arterial hypertension.  Electronically Signed: By: Toribio Aye M.D. On: 07/19/2018 12:38   CARDIAC MRI  MR CARDIAC MORPHOLOGY W WO CONTRAST 11/19/2022  Narrative CLINICAL DATA:  Frequent premature ventricular contractions (PVCs)  COMPARISON: Echocardiogram 09/22/22  EXAM: MR CARDIA  MORPHOLOGY WITHOUT AND WITH CONTRAST; MR CARDIAC VELOCITY FLOW MAPPING  TECHNIQUE: The patient was scanned on a 1.5 Tesla GE magnet. A dedicated cardiac coil was used. Functional imaging was done using [employer] sequences. 2,3, and 4 chamber views were done to assess for RWMA's. Modified Simpson's rule using a short axis stack was used to calculate an ejection fraction on a dedicated work [employer]. The patient received 10mL GADAVIST  GADOBUTROL  1 MMOL/ML IV SOLN. After 10 minutes  inversion recovery sequences were used to assess for infiltration and scar tissue. Phase contrast velocity encoded images obtained x 2.  This examination is tailored for evaluation cardiac anatomy and function and provides very limited assessment of noncardiac structures, which are accordingly not evaluated during interpretation. If there is clinical concern for extracardiac pathology, further evaluation with CT imaging should be considered.  FINDINGS: LEFT VENTRICLE:  Mild dilation of left ventricular chamber size by indexed volume.  Normal left ventricular wall thickness.  Moderately reduced left ventricular systolic function.  LVEF = 36%  Global hypokinesis with moderate hypokinesis of the basal to mid inferior wall and severe hypokinesis of the basal to apical inferoseptum.  No myocardial edema, T2 = 51 msec  Normal first pass perfusion.  There is no post contrast delayed myocardial enhancement.  Normal T1 myocardial nulling kinetics suggest against a diagnosis of cardiac amyloidosis.  ECV = 30%, nonspecific elevation.  RIGHT VENTRICLE:  Normal right ventricular chamber size by indexed volume.  Normal right ventricular wall thickness.  Mildly reduced right ventricular systolic function by EF calculation.  RVEF = 37%  Globally reduced function.  No post contrast delayed myocardial enhancement.  ATRIA:  Normal left atrial size.  Normal right atrial size.  VALVES:  No significant valvular abnormalities.  Tricuspid aortic valve.  Trivial AI and PI by flow quantitation.  Trivial MR, 7% regurgitant fraction.  Trace TR.  PERICARDIUM:  Normal pericardium.  No pericardial effusion.  OTHER: No significant extracardiac findings.  Severe dilation of main pulmonary artery, incompletely visualized on this exam and previously seen on cross sectional imaging.  MEASUREMENTS: Left ventricle:  LV Female  LV EF: 36% (Normal 56-78%)  Absolute  volumes:  LV EDV: (Normal 52-141 mL)  LV ESV: (Normal 13-51 mL)  LV SV: 80mL (normal 33-97 mL)  CO: 3.95L/min (normal 2.7-6.0 L/min)  Indexed volumes:  LV EDV: 60mL/sq-m (normal 41-81 mL/sq-m)  LV ESV: 85mL/sq-m (Normal 12-21 mL/sq-m)  LV SV: 12mL/sq-m (normal 26-56 mL/sq-m)  CI: 1.72L/min/sq-m (normal 1.8-3.8 L/min/sq-m)  Right ventricle:  RV female  RV EF: 37% (normal 47-80%)  Absolute volumes:  RV EDV: 198 mL (Normal 58-154 mL)  RV ESV: 125 mL (Normal 12-68 mL)  RV SV: 73 mL (Normal 35-98 mL)  CO: 3.6 L/min (Normal 2.7-6 L/min)  Indexed volumes:  RV EDV: 86 ML/sq-m (Normal 48-87 mL/sq-m)  RV ESV: 54 mL/sq-m (Normal 11-28 mL/sq-m)  RV SV: 32 mL/sq-m (Normal 27-57 mL/sq-m)  CI: 1.6 L/min/sq-m (Normal 1.8-3.8 L/min/sq-m)  IMPRESSION: 1. Moderately reduced left ventricular systolic function with mild LV dilation. LVEF 36%. Global hypokinesis with moderate hypokinesis of the basal to mid inferior wall and severe hypokinesis of the basal to apical inferoseptum.  2. Mildly reduced right ventricular systolic function with normal RV size. RVEF 37%.  3. No delayed myocardial enhancement. ECV 30%. No definite myocardial edema. No evidence of scar, fibrosis, inflammation, infarction or infiltrative cardiomyopathic process.  4.  Severe dilation of main pulmonary artery.   Electronically Signed By: Gayatri  Loni M.D. On: 11/21/2022 12:06   ______________________________________________________________________________________________      Risk Assessment/Calculations       STOP-Bang Score:  6      Physical Exam VS:  BP (!) 90/54   Pulse 63   Ht 5' 7 (1.702 m)   SpO2 90%   BMI 34.53 kg/m        Wt Readings from Last 3 Encounters:  10/02/24 220 lb 7.4 oz (100 kg)  09/19/24 230 lb (104.3 kg)  09/02/24 230 lb (104.3 kg)    GEN: Well nourished, well developed in no acute distress NECK: No JVD; No carotid bruits CARDIAC: RRR, no  murmurs, rubs, gallops RESPIRATORY:  Clear to auscultation without rales, wheezing or rhonchi  ABDOMEN: Soft, non-tender, non-distended EXTREMITIES:  No edema; No deformity   ASSESSMENT AND PLAN  HFrEF/NICM- Echo 12/12 in hospital LVEF 40-45%, LV mildly decreased function, LV global hypokinesis, grade 1 DD, RV systolic function mildly reduced, aortic valve regurgitation, and mild to moderate aortic valve stenosis.  Pro BNP 497 09/19/2024 and increased to 8501 09/30/2024.  Repeat BMP 10/17/2024 386. Potassium remains stable at 5.3. Her creatinine increased from 1.59 to 1.82. Her PCP decreased her Lasix  to 40 mg and 20 mg alternating each day. She has SOB, but it seems her fluid status is stable from a cardiac standpoint. She has follow-up appointment with pulmonology and testing 11/10/2024. ED precautions reviewed. Euvolemic and well compensated on exam.  Discussed importance of daily weights and log provided. Will decrease Potassium supplement to every other day. Will repeat proBNP and c-Met in 2 to 3 weeks. GDMT-furosemide  40 mg and 20 mg alternating daily, metoprolol  succinate 25 mg, spironolactone  12.5 mg, Entresto  24-26 mg twice daily, and potassium 20 mEq every other day. 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  Hypertension - She has recently noted lower BP.  BP today 90/54.  I spoke with her son on the phone and he notes BP at home in the 100s.  She is asymptomatic but she reports the dizziness, syncope, lightheadedness. She is still able to tolerate her Entresto  24/26 mg twice daily.   Hyperlipidemia -last LDL 72 on 10/01/2024.  Continue rosuvastatin  5 mg.  Pulmonary hypertension - RHC 07/2018 with results of mildly elevated right heart pressures and moderate pulmonary hypertension. (PA pressure 32 mmHg).  Remains short of breath.  Following up with pulmonology 11/10/2024.  OSA - Encourage completion of sleep study for sleep apnea evaluation.        Dispo: Follow up with Dr. Okey or APP in 2-3 months.   Signed, Mardy KATHEE Pizza, FNP  "

## 2024-10-18 NOTE — Addendum Note (Signed)
 Addended by: GLADIS PORTER HERO on: 10/18/2024 04:50 PM   Modules accepted: Orders

## 2024-10-25 ENCOUNTER — Telehealth: Payer: Self-pay | Admitting: Internal Medicine

## 2024-10-25 NOTE — Telephone Encounter (Signed)
 Patient is calling because she DOES NOT want to complete the sleep study test. Patient is requesting that we cancel the order. Please advise.

## 2024-10-25 NOTE — Telephone Encounter (Signed)
 States that she does not want to do the sleep study d/t being in the hospital and still has a terrible cough. Also states that her husband just passed away and she has a lot going on right now. Reassured patient that we will cancel this and she can reach back out if she decides she would like to do the study and we will let Dr Okey know.

## 2024-10-25 NOTE — Telephone Encounter (Signed)
 I left a VM with pt to express condolences She is due to see CHRISTELLA Bane in January.  Can she be switched to my clinic as an over book one day? (Around time of her appt that is scheduled)

## 2024-10-31 ENCOUNTER — Ambulatory Visit: Payer: Self-pay

## 2024-10-31 NOTE — Telephone Encounter (Signed)
 Answer Assessment - Initial Assessment Questions E2C2 Pulmonary Triage - Initial Assessment Questions Chief Complaint (e.g., cough, sob, wheezing, fever, chills, sweat or additional symptoms) *Go to specific symptom protocol after initial questions. Low oxygen, SOB, cough.  How long have symptoms been present? More than a few months.  Have you tested for COVID or Flu? Note: If not, ask patient if a home test can be taken. If so, instruct patient to call back for positive results. No   OXYGEN: Do you wear supplemental oxygen? No If yes, How many liters are you supposed to use? She is not ordered to wear oxygen but has been using her deceased husband's leftover oxygen.  Do you monitor your oxygen levels? Yes If yes, What is your reading (oxygen level) today? Unable to get a reading from her monitor at this time on her hands, tried both hands and multiple fingers. She reports her SpO2 was 92 % on 10/28/24 and a few days before that her SpO2 was 97% on 10/24/24. She states it has been as low as the 80%. Patient poor reporter/historian.  What is your usual oxygen saturation reading?  (Note: Pulmonary O2 sats should be 90% or greater) Per chart review baseline the past 2 months has been 90-93%        Copied from CRM #[id number]. Topic: Clinical - Red Word Triage >> Oct 31, 2024  9:01 AM Devaughn RAMAN wrote: Red Word that prompted transfer to Nurse Triage: Low oxygen levels Reason for Disposition  Oxygen level (e.g., pulse oximetry) 90% or lower  Answer Assessment - Initial Assessment Questions E2C2 Pulmonary Triage - Initial Assessment Questions Chief Complaint (e.g., cough, sob, wheezing, fever, chills, sweat or additional symptoms) *Go to specific symptom protocol after initial questions. Low oxygen, SOB, cough.  How long have symptoms been present? More than a few months.  Have you tested for COVID or Flu? Note: If not, ask patient if a home test can be taken. If so,  instruct patient to call back for positive results. No   OXYGEN: Do you wear supplemental oxygen? No If yes, How many liters are you supposed to use? She is not ordered to wear oxygen but has been using her deceased husband's leftover oxygen.  Do you monitor your oxygen levels? Yes If yes, What is your reading (oxygen level) today? Unable to get a reading from her monitor at this time on her hands, tried both hands and multiple fingers. She reports her SpO2 was 92 % on 10/28/24 and a few days before that her SpO2 was 97% on 10/24/24. She states it has been as low as the 80%. Patient poor reporter/historian.  What is your usual oxygen saturation reading?  (Note: Pulmonary O2 sats should be 90% or greater) Per chart review baseline the past 2 months has been 90-93%         1. RESPIRATORY STATUS: Describe your breathing? (e.g., wheezing, shortness of breath, unable to speak, severe coughing)      Low oxygen readings, SOB,cough.  2. ONSET: When did this breathing problem begin?      Few months.  3. PATTERN Does the difficult breathing come and go, or has it been constant since it started?      N/A.  4. SEVERITY: How bad is your breathing? (e.g., mild, moderate, severe)      Patient is able to speak in full sentences.  5. RECURRENT SYMPTOM: Have you had difficulty breathing before? If Yes, ask: When was the last time? and What  happened that time?      N/a.  6. CARDIAC HISTORY: Do you have any history of heart disease? (e.g., heart attack, angina, bypass surgery, angioplasty)      CHF, cardiomyopathy.  7. LUNG HISTORY: Do you have any history of lung disease?  (e.g., pulmonary embolus, asthma, emphysema)     Chronic bronchitis.  8. CAUSE: What do you think is causing the breathing problem?      Unsure. She states she has been having issue with cardiomyopathy.  9. OTHER SYMPTOMS: Do you have any other symptoms? (e.g., chest pain, cough, dizziness,  fever, runny nose)     No fever, sore throat.  10. O2 SATURATION MONITOR:  Do you use an oxygen saturation monitor (pulse oximeter) at home? If Yes, ask: What is your reading (oxygen level) today? What is your usual oxygen saturation reading? (e.g., 95%)       Unable to obtain SpO2 at this time, not reading.   RN stopped triage and advised ED once confirming with patient she has been having SpO2 readings in the 80s since being discharged from the hospital last month. Patient tearful and states she does not want to get admitted but is agreeable to have her son take her to the ED.  Protocols used: Breathing Difficulty-A-AH

## 2024-11-02 LAB — COMPREHENSIVE METABOLIC PANEL WITH GFR
ALT: 6 IU/L (ref 0–32)
AST: 15 IU/L (ref 0–40)
Albumin: 3.9 g/dL (ref 3.8–4.8)
Alkaline Phosphatase: 80 IU/L (ref 49–135)
BUN/Creatinine Ratio: 21 (ref 12–28)
BUN: 32 mg/dL — ABNORMAL HIGH (ref 8–27)
Bilirubin Total: 0.3 mg/dL (ref 0.0–1.2)
CO2: 24 mmol/L (ref 20–29)
Calcium: 9.2 mg/dL (ref 8.7–10.3)
Chloride: 105 mmol/L (ref 96–106)
Creatinine, Ser: 1.54 mg/dL — ABNORMAL HIGH (ref 0.57–1.00)
Globulin, Total: 2.8 g/dL (ref 1.5–4.5)
Glucose: 82 mg/dL (ref 70–99)
Potassium: 5.5 mmol/L — ABNORMAL HIGH (ref 3.5–5.2)
Sodium: 143 mmol/L (ref 134–144)
Total Protein: 6.7 g/dL (ref 6.0–8.5)
eGFR: 34 mL/min/1.73 — ABNORMAL LOW

## 2024-11-02 LAB — BRAIN NATRIURETIC PEPTIDE: BNP: 585.3 pg/mL — ABNORMAL HIGH (ref 0.0–100.0)

## 2024-11-03 ENCOUNTER — Ambulatory Visit: Payer: Self-pay

## 2024-11-09 ENCOUNTER — Telehealth: Payer: Self-pay

## 2024-11-09 DIAGNOSIS — I5020 Unspecified systolic (congestive) heart failure: Secondary | ICD-10-CM

## 2024-11-09 NOTE — Telephone Encounter (Signed)
 Proceed with PFT as scheduled tomorrow. Please advise her to not take any inhalers on the day of PFT as it may alter the results. If she just obtained the new inhalers and if she is feeling fine otherwise, she can move her appointment with me to 2-4 weeks from now so we can monitor the response to the new inhalers.

## 2024-11-09 NOTE — Telephone Encounter (Signed)
 Copied from CRM [id number]. Topic: Clinical - Medical Advice >> Nov 07, 2024 11:56 AM Isabell A wrote: Reason for CRM: Patient states she was prescribed 2 inhalers but only started them 3 days ago - would like to confirm this is enough time before she takes her PFT.  Callback number: [phone]   Please advise

## 2024-11-09 NOTE — Telephone Encounter (Signed)
 Explained to patient thoroughly to make sure she is not consuming more than 64oz, stopping K and staying away from food high in it, holding spiro for 1 wk. She is aware that she is to come in in one wk for lab work.   Yes, pt was taking K every other day.  She understand she is not to take the spiro until further instructed

## 2024-11-09 NOTE — Telephone Encounter (Signed)
 Patient advised on directions before test  PFT.

## 2024-11-10 ENCOUNTER — Ambulatory Visit

## 2024-11-10 ENCOUNTER — Ambulatory Visit (HOSPITAL_BASED_OUTPATIENT_CLINIC_OR_DEPARTMENT_OTHER)

## 2024-11-10 VITALS — BP 90/51 | HR 70 | Temp 97.7°F | Ht 67.0 in | Wt 220.0 lb

## 2024-11-10 DIAGNOSIS — J9611 Chronic respiratory failure with hypoxia: Secondary | ICD-10-CM | POA: Diagnosis not present

## 2024-11-10 DIAGNOSIS — J439 Emphysema, unspecified: Secondary | ICD-10-CM

## 2024-11-10 DIAGNOSIS — Z9109 Other allergy status, other than to drugs and biological substances: Secondary | ICD-10-CM

## 2024-11-10 DIAGNOSIS — I5022 Chronic systolic (congestive) heart failure: Secondary | ICD-10-CM | POA: Diagnosis not present

## 2024-11-10 DIAGNOSIS — R053 Chronic cough: Secondary | ICD-10-CM

## 2024-11-10 DIAGNOSIS — Z87891 Personal history of nicotine dependence: Secondary | ICD-10-CM | POA: Diagnosis not present

## 2024-11-10 DIAGNOSIS — K219 Gastro-esophageal reflux disease without esophagitis: Secondary | ICD-10-CM

## 2024-11-10 LAB — PULMONARY FUNCTION TEST
DL/VA % pred: 83 %
DL/VA: 3.31 ml/min/mmHg/L
DLCO cor % pred: 61 %
DLCO cor: 12.73 ml/min/mmHg
DLCO unc % pred: 58 %
DLCO unc: 12.28 ml/min/mmHg
FEF 25-75 Post: 0.74 L/s
FEF 25-75 Pre: 1 L/s
FEF2575-%Change-Post: -26 %
FEF2575-%Pred-Post: 45 %
FEF2575-%Pred-Pre: 61 %
FEV1-%Change-Post: -7 %
FEV1-%Pred-Post: 70 %
FEV1-%Pred-Pre: 76 %
FEV1-Post: 1.6 L
FEV1-Pre: 1.74 L
FEV1FVC-%Change-Post: 0 %
FEV1FVC-%Pred-Pre: 88 %
FEV6-%Change-Post: -8 %
FEV6-%Pred-Post: 84 %
FEV6-%Pred-Pre: 92 %
FEV6-Post: 2.42 L
FEV6-Pre: 2.65 L
FEV6FVC-%Change-Post: 0 %
FEV6FVC-%Pred-Post: 104 %
FEV6FVC-%Pred-Pre: 104 %
FVC-%Change-Post: -8 %
FVC-%Pred-Post: 80 %
FVC-%Pred-Pre: 87 %
FVC-Post: 2.44 L
FVC-Pre: 2.66 L
Post FEV1/FVC ratio: 65 %
Post FEV6/FVC ratio: 99 %
Pre FEV1/FVC ratio: 65 %
Pre FEV6/FVC Ratio: 100 %
RV % pred: 137 %
RV: 3.47 L
TLC % pred: 108 %
TLC: 5.96 L

## 2024-11-10 MED ORDER — ALBUTEROL SULFATE HFA 108 (90 BASE) MCG/ACT IN AERS: 2.0000 | INHALATION_SPRAY | RESPIRATORY_TRACT | 5 refills | Status: AC | PRN

## 2024-11-10 MED ORDER — FLUTICASONE-SALMETEROL 250-50 MCG/ACT IN AEPB: 1.0000 | INHALATION_SPRAY | Freq: Two times a day (BID) | RESPIRATORY_TRACT | 11 refills | Status: AC

## 2024-11-10 MED ORDER — IPRATROPIUM-ALBUTEROL 0.5-2.5 (3) MG/3ML IN SOLN: 3.0000 mL | Freq: Four times a day (QID) | RESPIRATORY_TRACT | 4 refills | Status: AC | PRN

## 2024-11-10 NOTE — Patient Instructions (Signed)
 Full PFT performed today.

## 2024-11-10 NOTE — Progress Notes (Addendum)
 "  New Patient Pulmonology Office Visit   Subjective:  Patient ID: Crystal Yoder, female    DOB: Sep 26, 1946  MRN: [id number]  Referred by: Teresa Channel, MD  CC:  Chief Complaint  Patient presents with   Cough    Patient states cough has improved a little bit     Ms Dobesh has hx of  chronic systolic congestive heart failure,sleep apnea and COPD. She used to follow with Dr Neysa.She has a history of sleep apnea but could not tolerate the CPAP machine due to developing a cough She also has a history of gout, which she believes is related to her Lasix  use.   She has a history of smoking in college but quit around 1980. Her last breathing test in 2012 showed mild obstruction.    Cough   Crystal Yoder is a 79 y.o. female who is here for follow up.  Discussed the use of AI scribe software for clinical note transcription with the patient, who gave verbal consent to proceed.  History of Present Illness Crystal Yoder is a 79 year old female with congestive heart failure who presents with low oxygen levels and fluctuating blood pressure. She is accompanied by Bard, her son.  She experiences low oxygen levels, with readings occasionally dropping into the sixties, seventies, and frequently in the eighties. These episodes have increased in frequency since her husband's passing. She uses a finger pulse oximeter to monitor her oxygen levels and a wrist cuff for blood pressure. Her pulse is irregular, and her blood pressure fluctuates significantly. She is currently using her late husband's oxygen as needed.  She has a history of cardiomyopathy and congestive heart failure, for which she was hospitalized in December for three nights. During this hospitalization, her Lasix  dose was doubled, which she feels has made her life more difficult. She is scheduled for blood work next Thursday as her potassium levels are being monitored. Her current medications include Lasix , metoprolol , and  Entresto .  She has a history of sleep apnea and has been experiencing episodes of apnea again. She previously used a CPAP machine but did not tolerate it well due to a persistent cough. She is scheduled for a sleep study evaluation through [facility] physicians  She experiences a cough that is triggered by cold air. She uses albuterol  as a rescue inhaler during these episodes. She used Advair twice a day She used neb 1-2 times a day No recent COPD exacerbations     Review of Systems  Respiratory:  Positive for cough.     Allergies: Penicillins, Sulfa antibiotics, and Sulfonamide derivatives Current Medications[1] Past Medical History:  Diagnosis Date   Arthritis    Arthropathy, unspecified, site unspecified    COPD (chronic obstructive pulmonary disease) (HCC)    Esophageal reflux    Essential hypertension 05/31/2017   Obesity, unspecified    Obstructive sleep apnea (adult) (pediatric)    Other acute sinusitis    Other primary cardiomyopathies    Psoriasis    Shortness of breath    Unspecified venous (peripheral) insufficiency    Past Surgical History:  Procedure Laterality Date   ABDOMINAL HYSTERECTOMY  2011   BLADDER SURGERY  2011   tac=ant/post   CHOLECYSTECTOMY N/A 06/01/2017   Procedure: LAPAROSCOPIC CHOLECYSTECTOMY;  Surgeon: Tanda Locus, MD;  Location: [facility];  Service: General;  Laterality: N/A;   IRRIGATION AND DEBRIDEMENT OF WOUND WITH SPLIT THICKNESS SKIN GRAFT Right 01/25/2014   Procedure: RIGHT FOOT IRRIGATION AND DEBRIDEMENT WITH ACELL/SPLICK  THICKNESS SKINGRAFT WITH VAC;  Surgeon: Estefana Reichert, DO;  Location: [facility];  Service: Plastics;  Laterality: Right;   PELVIC FLOOR REPAIR     REPLACEMENT TOTAL KNEE     rt and lt   RIGHT HEART CATH N/A 07/28/2018   Procedure: RIGHT HEART CATH;  Surgeon: Burnard Debby LABOR, MD;  Location: [facility] INVASIVE CV LAB;  Service: Cardiovascular;  Laterality: N/A;   RIGHT HEART CATH N/A 10/07/2023   Procedure: RIGHT HEART  CATH;  Surgeon: Wonda Sharper, MD;  Location: 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   Family History  Problem Relation Age of Onset   Prostate cancer Father    Diverticulitis Father    Diabetes Paternal Grandfather    Social History   Socioeconomic History   Marital status: Married    Spouse name: Not on file   Number of children: Not on file   Years of education: Not on file   Highest education level: Not on file  Occupational History   Occupation: Engineer, site  Tobacco Use   Smoking status: Former    Current packs/day: 0.00    Average packs/day: 0.5 packs/day for 10.0 years (5.0 ttl pk-yrs)    Types: Cigarettes    Start date: 10/27/1969    Quit date: 10/28/1979    Years since quitting: 45.0   Smokeless tobacco: Never  Vaping Use   Vaping status: Never Used  Substance and Sexual Activity   Alcohol use: Yes    Comment: rare   Drug use: No   Sexual activity: Not on file  Other Topics Concern   Not on file  Social History Narrative   Not on file   Social Drivers of Health   Tobacco Use: Medium Risk (11/10/2024)   Patient History    Smoking Tobacco Use: Former    Smokeless Tobacco Use: Never    Passive Exposure: Not on file  Financial Resource Strain: Medium Risk (11/08/2021)   Overall Financial Resource Strain (CARDIA)    Difficulty of Paying Living Expenses: Somewhat hard  Food Insecurity: No Food Insecurity (09/30/2024)   Epic    Worried About Radiation Protection Practitioner of Food in the Last Year: Never true    Ran Out of Food in the Last Year: Never true  Transportation Needs: No Transportation Needs (09/30/2024)   Epic    Lack of Transportation (Medical): No    Lack of Transportation (Non-Medical): No  Physical Activity: Not on file  Stress: Stress Concern Present (10/31/2022)   [employer] of Occupational Health - Occupational Stress Questionnaire    Feeling of Stress : Very much  Social Connections:  Unknown (09/30/2024)   Social Connection and Isolation Panel    Frequency of Communication with Friends and Family: More than three times a week    Frequency of Social Gatherings with Friends and Family: Twice a week    Attends Religious Services: 1 to 4 times per year    Active Member of [employer] or Organizations: No    Attends Banker Meetings: Never    Marital Status: Patient declined  Catering Manager Violence: Not At Risk (09/30/2024)   Epic    Fear of Current or Ex-Partner: No    Emotionally Abused: No    Physically Abused: No    Sexually Abused: No  Depression (PHQ2-9): Not on file  Alcohol Screen: Not on file  Housing: Low Risk (09/30/2024)   Epic  Unable to Pay for Housing in the Last Year: No    Number of Times Moved in the Last Year: 0    Homeless in the Last Year: No  Utilities: Not At Risk (09/30/2024)   Epic    Threatened with loss of utilities: No  Health Literacy: Not on file         Objective:  BP (!) 90/51   Pulse (!) 51   Temp 97.7 F (36.5 C) (Oral)   Ht 5' 7 (1.702 m)   Wt 220 lb (99.8 kg)   SpO2 (!) 88% Comment: RA  BMI 34.46 kg/m    Physical Exam Constitutional:      General: She is not in acute distress.    Appearance: Normal appearance.  HENT:     Mouth/Throat:     Mouth: Mucous membranes are moist.  Cardiovascular:     Rate and Rhythm: Normal rate.  Pulmonary:     Effort: No respiratory distress.     Breath sounds: Wheezing and rales present.  Musculoskeletal:     Right lower leg: No edema.     Left lower leg: No edema.  Skin:    General: Skin is warm.  Neurological:     Mental Status: She is alert and oriented to person, place, and time.  Psychiatric:        Mood and Affect: Mood normal.     Diagnostic Review:    Pft    Latest Ref Rng & Units 11/10/2024   12:37 PM  PFT Results  FVC-Pre L 2.66  P  FVC-Predicted Pre % 87  P  FVC-Post L 2.44  P  FVC-Predicted Post % 80  P  Pre FEV1/FVC % % 65  P  Post  FEV1/FCV % % 65  P  FEV1-Pre L 1.74  P  FEV1-Predicted Pre % 76  P  FEV1-Post L 1.60  P  DLCO uncorrected ml/min/mmHg 12.28  P  DLCO UNC% % 58  P  DLCO corrected ml/min/mmHg 12.73  P  DLCO COR %Predicted % 61  P  DLVA Predicted % 83  P  TLC L 5.96  P  TLC % Predicted % 108  P  RV % Predicted % 137  P    P Preliminary result  Air trapping 123% DLCO 58% No obstruction     ECHO 09/2024 1. Left ventricular ejection fraction, by estimation, is 40 to 45%. The  left ventricle has mildly decreased function. The left ventricle  demonstrates global hypokinesis. The left ventricular internal cavity size  was mildly dilated. There is mild left  ventricular hypertrophy of the basal-septal segment. Left ventricular  diastolic parameters are consistent with Grade I diastolic dysfunction  (impaired relaxation).   2. Right ventricular systolic function is mildly reduced. The right  ventricular size is normal. There is normal pulmonary artery systolic  pressure. The estimated right ventricular systolic pressure is 10.3 mmHg.   3. The mitral valve is degenerative. Trivial mitral valve regurgitation.  No evidence of mitral stenosis. Moderate mitral annular calcification.   4. The aortic valve is calcified. Aortic valve regurgitation is not  visualized. Mild to moderate aortic valve stenosis. Aortic valve area, by  VTI measures 1.38 cm. Aortic valve mean gradient measures 6.0 mmHg.  Aortic valve Vmax measures 1.74 m/s. DI  0.44 and SVI 23. Findings overall c/w mild low flow low gradient aortic  stenosis.   5. The inferior vena cava is normal in size with greater than 50%  respiratory  variability, suggesting right atrial pressure of 3  mmHg.Recommend repeat limited study with definity contrast to better  assess LVF and look for focal wall motion  abnormalitiers   08/2024 BNP 490, BNP 585 10/2024 Absolute eosinophils 200  Results Labs BNP (09/2024): Elevated  Diagnostic Pulmonary  function testing: No significant impairment; findings not consistent with severe obstructive or restrictive lung disease      Assessment & Plan:   Assessment & Plan Chronic respiratory failure with hypoxia (HCC) Resting oxygen saturation 88% in clinic today Son reports oxygen saturation dropped down to 60% at home Patient will need O2 at rest and when ambulating; will need 3L Poc  Will prescribe home oxygen to be used at 3 L at all times Orders:   Ambulatory Referral for DME  Chronic obstructive pulmonary disease with emphysema, unspecified emphysema type (HCC) Symptoms currently well-controlled. PFT does not show obstruction but does show air trapping which suggest possible COPD Continue Advair.  Advised patient to use DuoNebs and albuterol  as needed for rescue Orders:   ipratropium-albuterol  (DUONEB) 0.5-2.5 (3) MG/3ML SOLN; Take 3 mLs by nebulization every 6 (six) hours as needed.   albuterol  (VENTOLIN  HFA) 108 (90 Base) MCG/ACT inhaler; Inhale 2 puffs into the lungs every 4 (four) hours as needed for wheezing or shortness of breath.   fluticasone -salmeterol (ADVAIR) 250-50 MCG/ACT AEPB; Inhale 1 puff into the lungs every 12 (twelve) hours.  Chronic systolic (congestive) heart failure (HCC) Hypoxia likely secondary to failure.  BNP elevated during recent admissions and previously during last clinic visit Patient takes Lasix  Blood pressure and heart rate low normal in clinic today.  Patient remains asymptomatic Continue beta-blocker, Entresto  and diuretics as per cardiology.  May benefit from CHF clinic     Chronic cough Likely from COPD and pulmonary edema from CHF.  Orders:   ipratropium-albuterol  (DUONEB) 0.5-2.5 (3) MG/3ML SOLN; Take 3 mLs by nebulization every 6 (six) hours as needed.   fluticasone -salmeterol (ADVAIR) 250-50 MCG/ACT AEPB; Inhale 1 puff into the lungs every 12 (twelve) hours.    Thank you for the opportunity to take part in the care of Crystal Yoder    Return in about 6 months (around 05/10/2025).   Derry Arbogast Pleas, MD [facility] Office: [phone]  I personally spent a total of 40 minutes in the care of the patient today including getting/reviewing separately obtained history, performing a medically appropriate exam/evaluation, counseling and educating, referring and communicating with other health care professionals, documenting clinical information in the EHR, independently interpreting results, and communicating results.      [1]  Current Outpatient Medications:    ascorbic acid (VITAMIN C) 100 MG tablet, Take 1 tablet by mouth as needed., Disp: , Rfl:    Cholecalciferol 25 MCG (1000 UT) capsule, Take 1,000 Units by mouth as needed., Disp: , Rfl:    clotrimazole-betamethasone (LOTRISONE) cream, as needed., Disp: , Rfl:    dextromethorphan (DELSYM) 30 MG/5ML liquid, Take 30 mg by mouth as needed for cough., Disp: , Rfl:    fluticasone  (FLONASE ) 50 MCG/ACT nasal spray, Place 2 sprays into both nostrils daily as needed for allergies., Disp: , Rfl:    furosemide  (LASIX ) 40 MG tablet, Take 1 tablet (40 mg total) by mouth daily. (Patient taking differently: Take 40 mg by mouth daily. 40 mg one day and 20 mg the next), Disp: 30 tablet, Rfl: 0   guaiFENesin (MUCINEX) 600 MG 12 hr tablet, Take 600 mg by mouth as needed., Disp: , Rfl:  loratadine  (CLARITIN ) 10 MG tablet, Take 10 mg by mouth daily., Disp: , Rfl:    Melatonin 10 MG TABS, Take 20 mg by mouth at bedtime as needed (sleep)., Disp: , Rfl:    metoprolol  succinate (TOPROL -XL) 25 MG 24 hr tablet, TAKE 1 TABLET(25 MG) BY MOUTH DAILY, Disp: 90 tablet, Rfl: 1   pantoprazole  (PROTONIX ) 40 MG tablet, Take 40 mg by mouth daily., Disp: , Rfl:    rosuvastatin  (CRESTOR ) 5 MG tablet, TAKE 1 TABLET(5 MG) BY MOUTH DAILY, Disp: 90 tablet, Rfl: 1   sacubitril -valsartan  (ENTRESTO ) 24-26 MG, Take 1 tablet by mouth 2 (two) times daily., Disp: 180 tablet, Rfl: 2   albuterol   (VENTOLIN  HFA) 108 (90 Base) MCG/ACT inhaler, Inhale 2 puffs into the lungs every 4 (four) hours as needed for wheezing or shortness of breath., Disp: 1 each, Rfl: 5   colchicine  0.6 MG tablet, Take 1 tablet (0.6 mg total) by mouth 2 (two) times daily. (Patient not taking: Reported on 11/10/2024), Disp: 60 tablet, Rfl: 1   fluticasone -salmeterol (ADVAIR) 250-50 MCG/ACT AEPB, Inhale 1 puff into the lungs every 12 (twelve) hours., Disp: 60 each, Rfl: 11   ipratropium-albuterol  (DUONEB) 0.5-2.5 (3) MG/3ML SOLN, Take 3 mLs by nebulization every 6 (six) hours as needed., Disp: 30 mL, Rfl: 4   potassium chloride  SA (KLOR-CON  M) 20 MEQ tablet, Take 1 tablet (20 mEq total) by mouth every other day. (Patient not taking: Reported on 11/10/2024), Disp: 45 tablet, Rfl: 1   Quercetin 250 MG TABS, Take 500 mg by mouth as needed. (Patient not taking: Reported on 11/10/2024), Disp: , Rfl:    spironolactone  (ALDACTONE ) 25 MG tablet, TAKE 1/2 TABLET(12.5 MG) BY MOUTH DAILY (Patient not taking: Reported on 11/10/2024), Disp: 45 tablet, Rfl: 3  "

## 2024-11-10 NOTE — Patient Instructions (Addendum)
 VISIT SUMMARY: During your visit, we discussed your low oxygen levels, fluctuating blood pressure, and recent exacerbation of congestive heart failure. We also reviewed your chronic respiratory failure, COPD, and obstructive sleep apnea.  YOUR PLAN: CHRONIC SYSTOLIC HEART FAILURE: You have chronic systolic heart failure with recent exacerbation requiring hospitalization. Your oxygen levels have been low, likely due to fluid retention in the heart and lungs. -Discuss with your cardiologist about adjusting your heart failure medications, particularly Lasix , metoprolol , and Entresto . -Inquire about a congestive heart failure clinic for regular monitoring and management. -Use oxygen during exertion and at night.  CHRONIC RESPIRATORY FAILURE WITH HYPOXIA: You have chronic respiratory failure with hypoxia, likely secondary to congestive heart failure. Your oxygen levels have been low, with occasional readings in the sixties. -Use portable oxygen during exertion and sleep. -Monitor your oxygen saturation levels and adjust oxygen flow as needed to maintain levels between 88-90%.  CHRONIC OBSTRUCTIVE PULMONARY DISEASE WITH EMPHYSEMA: You have chronic obstructive pulmonary disease with emphysema, contributing to your respiratory symptoms. Cold air exacerbates your symptoms. -Continue using Advair twice daily and rinse your mouth after use. -Use your albuterol  inhaler as needed for acute symptoms. -Use your nebulizer up to three to four times daily as needed. -Carry your albuterol  inhaler when going out in cold weather.  OBSTRUCTIVE SLEEP APNEA: You  previously did not tolerate CPAP well.  -Communicate your oxygen use during the sleep study to ensure proper testing.   Contains text generated by [employer].

## 2024-11-10 NOTE — Progress Notes (Signed)
 Full PFT performed today.

## 2024-11-10 NOTE — Assessment & Plan Note (Addendum)
 Likely from COPD and pulmonary edema from CHF.  Orders:   ipratropium-albuterol  (DUONEB) 0.5-2.5 (3) MG/3ML SOLN; Take 3 mLs by nebulization every 6 (six) hours as needed.   fluticasone -salmeterol (ADVAIR) 250-50 MCG/ACT AEPB; Inhale 1 puff into the lungs every 12 (twelve) hours.

## 2024-11-17 LAB — BASIC METABOLIC PANEL WITH GFR
BUN/Creatinine Ratio: 26 (ref 12–28)
BUN: 36 mg/dL — ABNORMAL HIGH (ref 8–27)
CO2: 22 mmol/L (ref 20–29)
Calcium: 9.4 mg/dL (ref 8.7–10.3)
Chloride: 105 mmol/L (ref 96–106)
Creatinine, Ser: 1.39 mg/dL — ABNORMAL HIGH (ref 0.57–1.00)
Glucose: 85 mg/dL (ref 70–99)
Potassium: 5 mmol/L (ref 3.5–5.2)
Sodium: 143 mmol/L (ref 134–144)
eGFR: 39 mL/min/1.73 — ABNORMAL LOW

## 2024-11-18 ENCOUNTER — Ambulatory Visit: Payer: Self-pay

## 2024-11-18 NOTE — Progress Notes (Signed)
 Patient understands that she is to continue to hold the spiro and potassium, eat heart healthy and return 4-6 wks for labs

## 2024-11-23 ENCOUNTER — Telehealth: Payer: Self-pay

## 2024-11-23 NOTE — Telephone Encounter (Signed)
 Copied from CRM [id number]. Topic: General - Other >> Nov 23, 2024  1:58 PM Celestine FALCON wrote: Reason for CRM: Brittany with [employer] is calling to request a fax to [phone] regarding the walk test results due to [employer] needing O2 saturations to get qualified to bill her insurance.

## 2024-11-24 NOTE — Telephone Encounter (Signed)
 [facility], This patient was seen by Dr. Pleas on 11/10/24.  Did you do the qualifying walk?  [employer] needs the information for the walk.  Please put the information under vital signs and state whether it is at rest or with exertion and if it is on room air or on oxygen (how many liters of oxygen).  Thank you.

## 2024-12-02 NOTE — Telephone Encounter (Signed)
 Qualifying walk documented for patient. Patient will need O2 at rest and when ambulating; will need 3L Poc. Please addend note, thanks.

## 2024-12-02 NOTE — Telephone Encounter (Signed)
 Agreed. She is going to need 3 L oxygen at rest and exertion.

## 2024-12-02 NOTE — Telephone Encounter (Signed)
 Ov with walk test  faxed to fax number provided. nfn

## 2025-02-16 ENCOUNTER — Ambulatory Visit: Admitting: Physician Assistant

## 2025-02-16 ENCOUNTER — Ambulatory Visit (HOSPITAL_BASED_OUTPATIENT_CLINIC_OR_DEPARTMENT_OTHER): Admitting: Nurse Practitioner
